# Patient Record
Sex: Female | Born: 1960 | Race: White | Hispanic: No | Marital: Married | Smoking: Former smoker
Health system: Southern US, Community
[De-identification: ages and names within clinical notes are randomized; demographics above are authoritative.]

## PROBLEM LIST (undated history)

## (undated) DIAGNOSIS — F329 Major depressive disorder, single episode, unspecified: Secondary | ICD-10-CM

## (undated) DIAGNOSIS — G35 Multiple sclerosis: Secondary | ICD-10-CM

## (undated) DIAGNOSIS — T7840XA Allergy, unspecified, initial encounter: Secondary | ICD-10-CM

## (undated) DIAGNOSIS — G4730 Sleep apnea, unspecified: Secondary | ICD-10-CM

## (undated) DIAGNOSIS — I10 Essential (primary) hypertension: Secondary | ICD-10-CM

## (undated) DIAGNOSIS — M1990 Unspecified osteoarthritis, unspecified site: Secondary | ICD-10-CM

## (undated) DIAGNOSIS — J45909 Unspecified asthma, uncomplicated: Secondary | ICD-10-CM

## (undated) DIAGNOSIS — F32A Depression, unspecified: Secondary | ICD-10-CM

## (undated) DIAGNOSIS — E119 Type 2 diabetes mellitus without complications: Secondary | ICD-10-CM

## (undated) DIAGNOSIS — N189 Chronic kidney disease, unspecified: Secondary | ICD-10-CM

## (undated) DIAGNOSIS — G709 Myoneural disorder, unspecified: Secondary | ICD-10-CM

## (undated) DIAGNOSIS — G35D Multiple sclerosis, unspecified: Secondary | ICD-10-CM

## (undated) HISTORY — DX: Depression, unspecified: F32.A

## (undated) HISTORY — DX: Multiple sclerosis: G35

## (undated) HISTORY — DX: Chronic kidney disease, unspecified: N18.9

## (undated) HISTORY — DX: Sleep apnea, unspecified: G47.30

## (undated) HISTORY — DX: Major depressive disorder, single episode, unspecified: F32.9

## (undated) HISTORY — DX: Unspecified osteoarthritis, unspecified site: M19.90

## (undated) HISTORY — DX: Type 2 diabetes mellitus without complications: E11.9

## (undated) HISTORY — DX: Allergy, unspecified, initial encounter: T78.40XA

## (undated) HISTORY — DX: Essential (primary) hypertension: I10

## (undated) HISTORY — DX: Multiple sclerosis, unspecified: G35.D

## (undated) HISTORY — DX: Myoneural disorder, unspecified: G70.9

## (undated) HISTORY — DX: Unspecified asthma, uncomplicated: J45.909

---

## 2011-08-11 DIAGNOSIS — K219 Gastro-esophageal reflux disease without esophagitis: Secondary | ICD-10-CM | POA: Insufficient documentation

## 2011-08-11 DIAGNOSIS — Z8742 Personal history of other diseases of the female genital tract: Secondary | ICD-10-CM | POA: Insufficient documentation

## 2011-08-11 DIAGNOSIS — E559 Vitamin D deficiency, unspecified: Secondary | ICD-10-CM | POA: Insufficient documentation

## 2011-08-11 DIAGNOSIS — Z860100 Personal history of colon polyps, unspecified: Secondary | ICD-10-CM | POA: Insufficient documentation

## 2011-08-11 DIAGNOSIS — Z79899 Other long term (current) drug therapy: Secondary | ICD-10-CM | POA: Insufficient documentation

## 2011-08-11 DIAGNOSIS — G35 Multiple sclerosis: Secondary | ICD-10-CM | POA: Insufficient documentation

## 2011-08-11 DIAGNOSIS — I10 Essential (primary) hypertension: Secondary | ICD-10-CM | POA: Insufficient documentation

## 2011-08-11 DIAGNOSIS — Z87898 Personal history of other specified conditions: Secondary | ICD-10-CM | POA: Insufficient documentation

## 2011-08-11 DIAGNOSIS — Z8669 Personal history of other diseases of the nervous system and sense organs: Secondary | ICD-10-CM | POA: Insufficient documentation

## 2011-08-11 DIAGNOSIS — R0989 Other specified symptoms and signs involving the circulatory and respiratory systems: Secondary | ICD-10-CM | POA: Insufficient documentation

## 2011-08-11 DIAGNOSIS — G4733 Obstructive sleep apnea (adult) (pediatric): Secondary | ICD-10-CM | POA: Insufficient documentation

## 2011-08-11 DIAGNOSIS — R002 Palpitations: Secondary | ICD-10-CM | POA: Insufficient documentation

## 2011-08-11 DIAGNOSIS — E119 Type 2 diabetes mellitus without complications: Secondary | ICD-10-CM | POA: Insufficient documentation

## 2011-08-11 DIAGNOSIS — M159 Polyosteoarthritis, unspecified: Secondary | ICD-10-CM | POA: Insufficient documentation

## 2011-08-11 DIAGNOSIS — N959 Unspecified menopausal and perimenopausal disorder: Secondary | ICD-10-CM | POA: Insufficient documentation

## 2011-08-11 DIAGNOSIS — F341 Dysthymic disorder: Secondary | ICD-10-CM | POA: Insufficient documentation

## 2011-08-11 DIAGNOSIS — E538 Deficiency of other specified B group vitamins: Secondary | ICD-10-CM | POA: Insufficient documentation

## 2011-08-11 DIAGNOSIS — E7800 Pure hypercholesterolemia, unspecified: Secondary | ICD-10-CM | POA: Insufficient documentation

## 2012-02-20 DIAGNOSIS — N183 Chronic kidney disease, stage 3 unspecified: Secondary | ICD-10-CM | POA: Insufficient documentation

## 2012-05-23 DIAGNOSIS — R161 Splenomegaly, not elsewhere classified: Secondary | ICD-10-CM | POA: Insufficient documentation

## 2012-05-24 DIAGNOSIS — Z8639 Personal history of other endocrine, nutritional and metabolic disease: Secondary | ICD-10-CM | POA: Insufficient documentation

## 2012-05-24 DIAGNOSIS — R002 Palpitations: Secondary | ICD-10-CM | POA: Insufficient documentation

## 2012-05-24 DIAGNOSIS — M169 Osteoarthritis of hip, unspecified: Secondary | ICD-10-CM | POA: Insufficient documentation

## 2012-05-24 DIAGNOSIS — Z780 Asymptomatic menopausal state: Secondary | ICD-10-CM | POA: Insufficient documentation

## 2012-05-24 DIAGNOSIS — K219 Gastro-esophageal reflux disease without esophagitis: Secondary | ICD-10-CM | POA: Insufficient documentation

## 2012-05-24 DIAGNOSIS — N951 Menopausal and female climacteric states: Secondary | ICD-10-CM | POA: Insufficient documentation

## 2012-05-24 DIAGNOSIS — M1990 Unspecified osteoarthritis, unspecified site: Secondary | ICD-10-CM | POA: Insufficient documentation

## 2012-05-24 DIAGNOSIS — K635 Polyp of colon: Secondary | ICD-10-CM | POA: Insufficient documentation

## 2012-05-24 DIAGNOSIS — E7800 Pure hypercholesterolemia, unspecified: Secondary | ICD-10-CM | POA: Insufficient documentation

## 2012-05-25 DIAGNOSIS — E1122 Type 2 diabetes mellitus with diabetic chronic kidney disease: Secondary | ICD-10-CM | POA: Insufficient documentation

## 2012-05-25 DIAGNOSIS — E119 Type 2 diabetes mellitus without complications: Secondary | ICD-10-CM | POA: Insufficient documentation

## 2012-07-29 ENCOUNTER — Ambulatory Visit: Payer: Self-pay | Admitting: Family Medicine

## 2013-03-24 ENCOUNTER — Ambulatory Visit: Payer: Self-pay | Admitting: Pain Medicine

## 2013-03-24 LAB — HEPATIC FUNCTION PANEL A (ARMC)
Albumin: 4.2 g/dL (ref 3.4–5.0)
Alkaline Phosphatase: 94 U/L
Bilirubin, Direct: <0.1 mg/dL (ref 0.00–0.20)
Bilirubin,Total: 0.3 mg/dL (ref 0.2–1.0)
SGOT(AST): 27 U/L (ref 15–37)
SGPT (ALT): 42 U/L (ref 12–78)
Total Protein: 7.8 g/dL (ref 6.4–8.2)

## 2013-03-24 LAB — BASIC METABOLIC PANEL
Anion Gap: 2 — ABNORMAL LOW (ref 7–16)
BUN: 24 mg/dL — ABNORMAL HIGH (ref 7–18)
Calcium, Total: 9.4 mg/dL (ref 8.5–10.1)
Chloride: 101 mmol/L (ref 98–107)
Co2: 31 mmol/L (ref 21–32)
Creatinine: 1.45 mg/dL — ABNORMAL HIGH (ref 0.60–1.30)
EGFR (African American): 48 — ABNORMAL LOW
EGFR (Non-African Amer.): 41 — ABNORMAL LOW
Glucose: 169 mg/dL — ABNORMAL HIGH (ref 65–99)
Osmolality: 276 (ref 275–301)
Potassium: 4.6 mmol/L (ref 3.5–5.1)
Sodium: 134 mmol/L — ABNORMAL LOW (ref 136–145)

## 2013-03-24 LAB — SEDIMENTATION RATE: Erythrocyte Sed Rate: 27 mm/hr (ref 0–30)

## 2013-03-24 LAB — MAGNESIUM: Magnesium: 1.7 mg/dL — ABNORMAL LOW

## 2013-04-28 ENCOUNTER — Other Ambulatory Visit: Payer: Self-pay | Admitting: Pain Medicine

## 2013-04-28 ENCOUNTER — Ambulatory Visit: Payer: Self-pay | Admitting: Pain Medicine

## 2013-05-26 ENCOUNTER — Ambulatory Visit: Payer: Self-pay | Admitting: Pain Medicine

## 2013-06-23 ENCOUNTER — Ambulatory Visit: Payer: Self-pay | Admitting: Pain Medicine

## 2013-08-19 ENCOUNTER — Ambulatory Visit: Payer: Self-pay | Admitting: Internal Medicine

## 2013-08-27 ENCOUNTER — Ambulatory Visit: Payer: Self-pay | Admitting: Pain Medicine

## 2013-11-27 ENCOUNTER — Ambulatory Visit: Payer: Self-pay | Admitting: Pain Medicine

## 2014-11-03 IMAGING — CR DG LUMBAR SPINE COMPLETE W/ BEND
1 series · 6 of 6 positions shown · non-contrast
Comparison: None.

CLINICAL DATA: Chronic pain

EXAM:
LUMBAR SPINE - COMPLETE WITH BENDING VIEWS

[Series 1: w lumbar spine lat · 0.14mm/px · 6 of 6 slices shown]
[im 1/6]
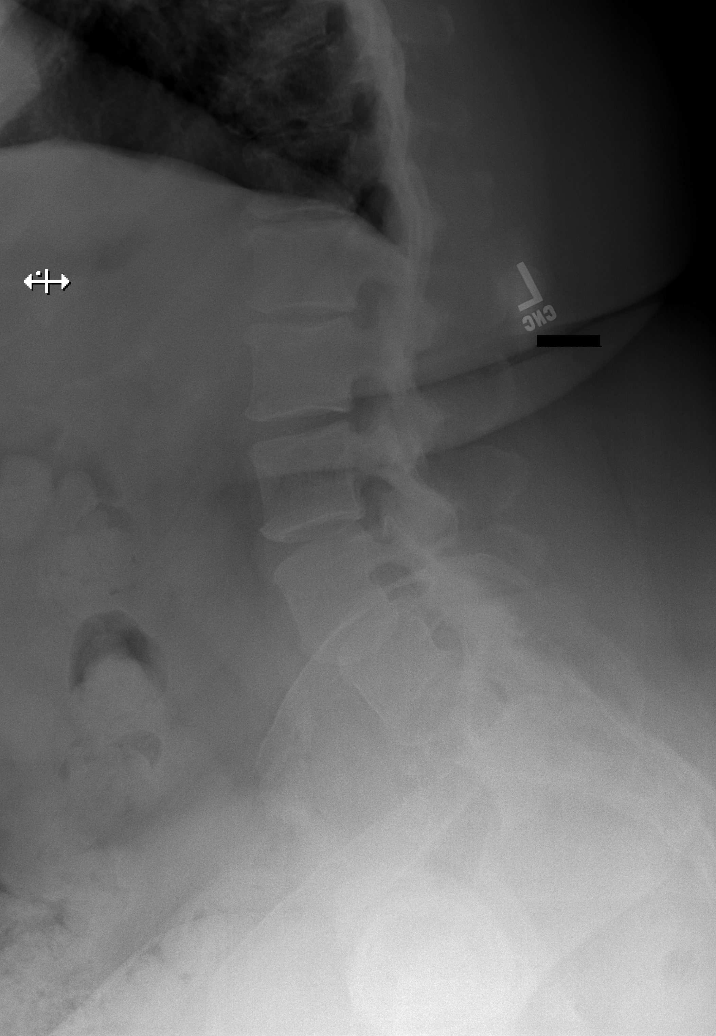
[im 2/6]
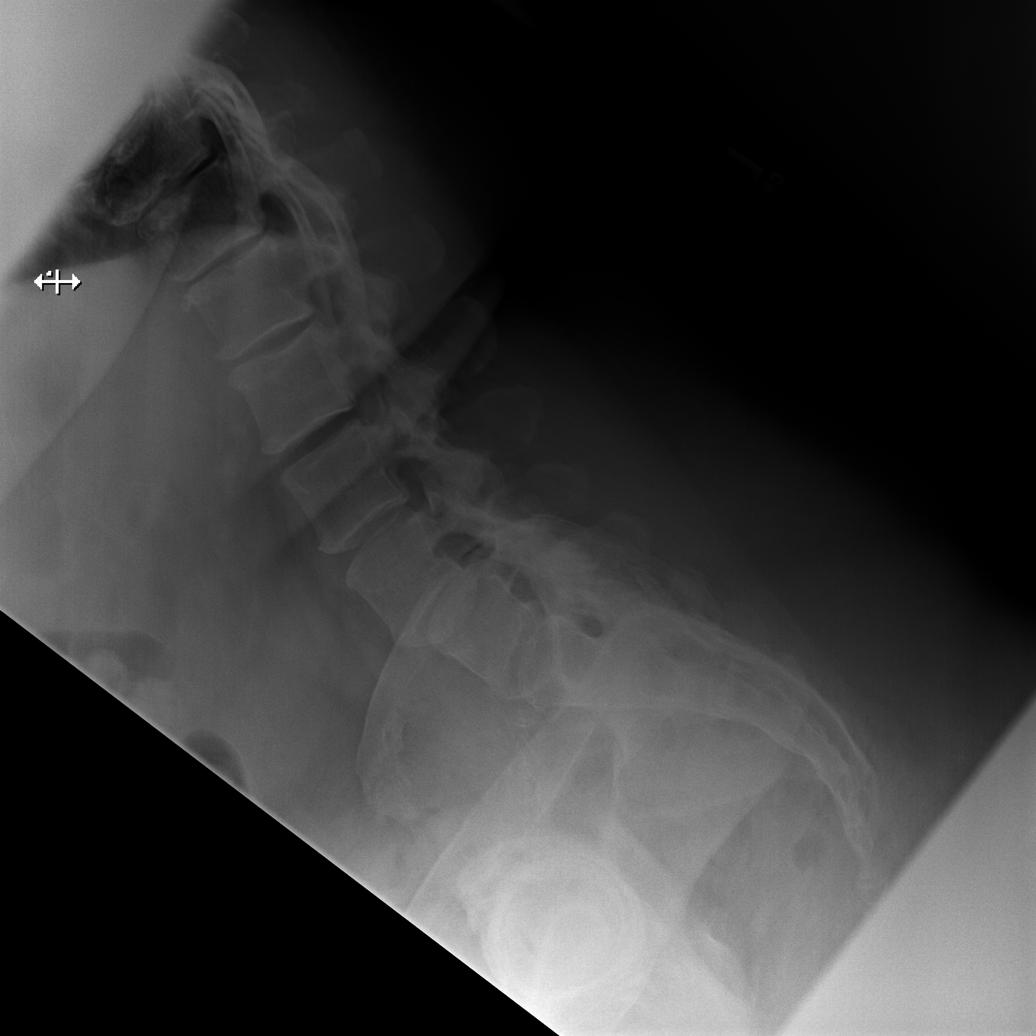
[im 3/6]
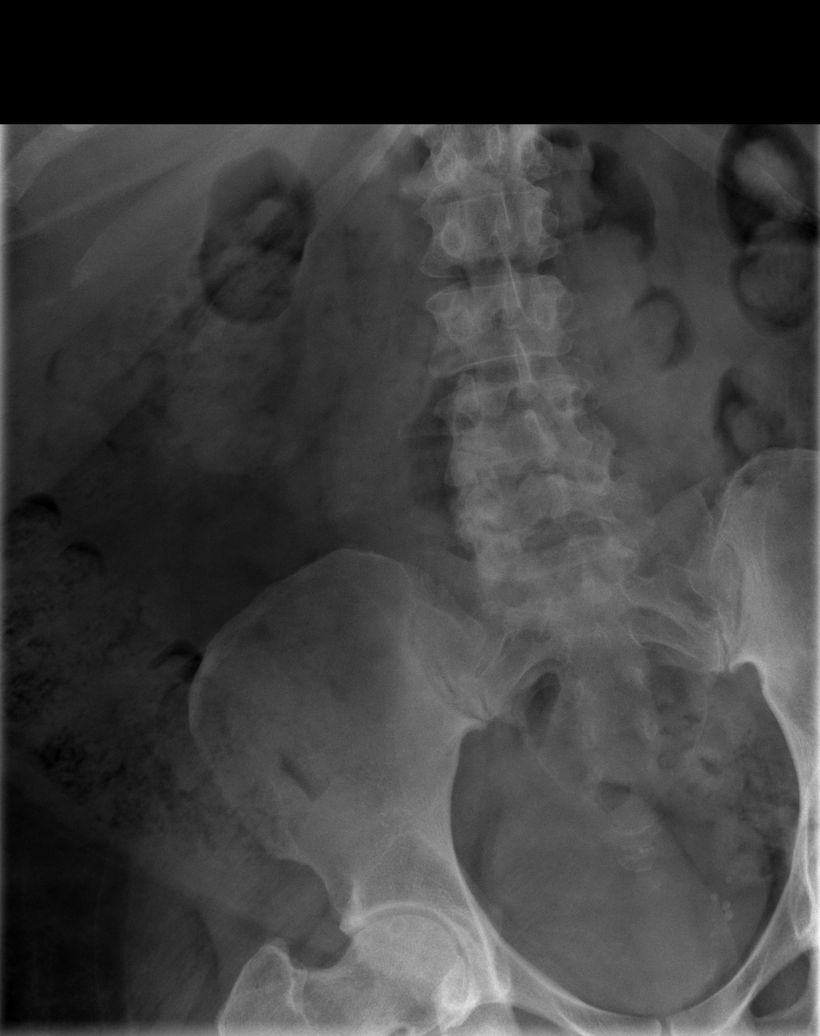
[im 4/6]
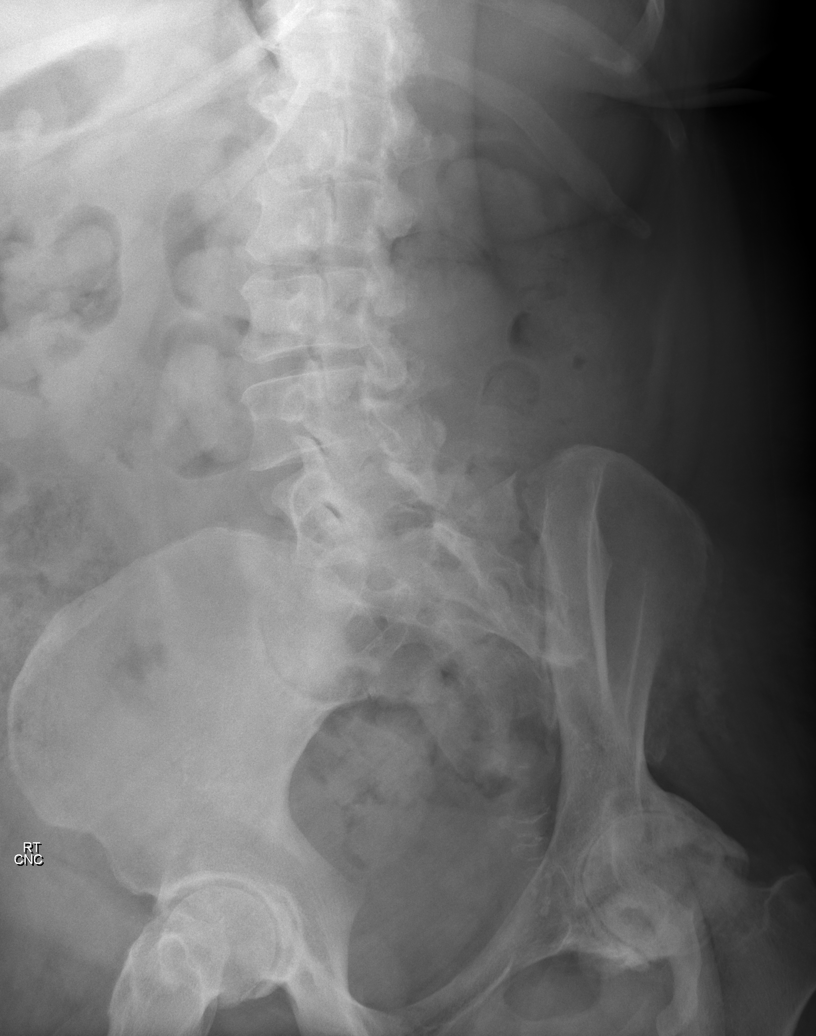
[im 5/6]
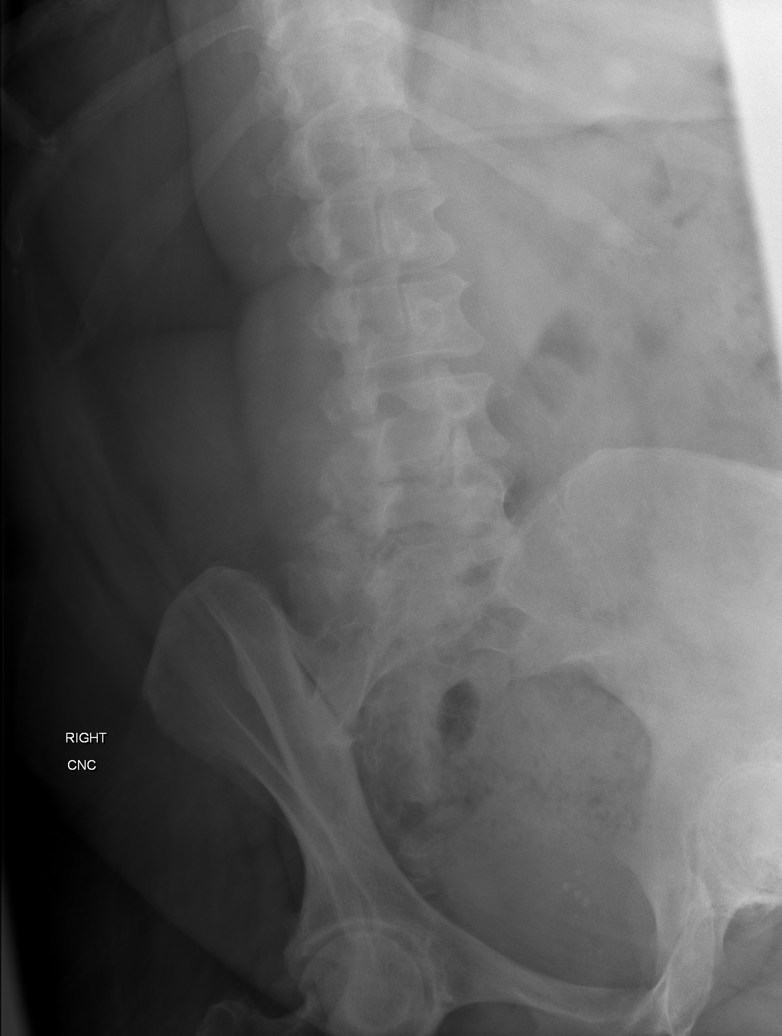
[im 6/6]
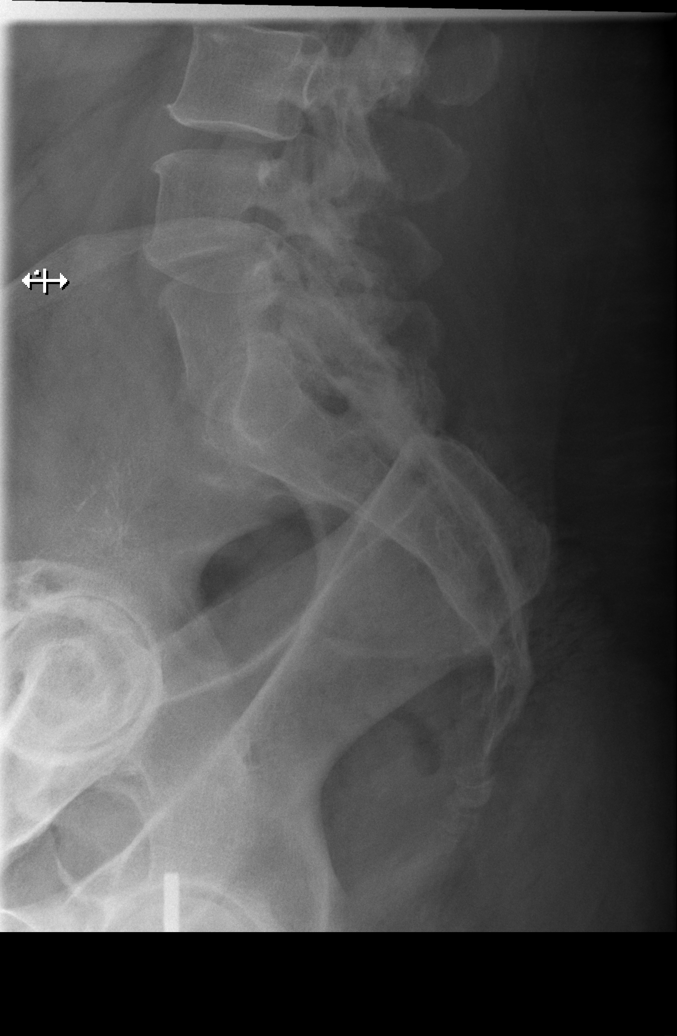

[6 of 6 positions shown; findings below may reference images not displayed]

FINDINGS: Negative fracture. Grade 1 anterolisthesis L4-5 without evidence of
dynamic instability on flexion-extension. No definite pars defect.
Bilateral facet degenerative hypertrophy L4-5 and L5-S1, right worse
than left. Advanced bilateral hip osteoarthritis. Disc narrowing and
small endplate spurs in the visualized lower thoracic spine.
IMPRESSION: 1. Negative for fracture or other acute bone abnormality.
2. Spondylitic changes in the lower thoracic and lower lumbar spine
as above.
3. Grade 1 anterolisthesis L4-5 without dynamic instability,
possibly secondary to facet disease.

## 2014-11-03 IMAGING — CR BILATERAL SACROILIAC JOINTS - 3+ VIEW
1 series · 4 of 4 positions shown · non-contrast
Comparison: DG HIP COMPLETE 2+V*L* dated 03/24/2013; DG HIP COMPLETE
2+V*R* dated 03/24/2013

CLINICAL DATA: Chronic pain syndrome with limited range of motion.

EXAM:
BILATERAL SACROILIAC JOINTS - 3+ VIEW

[Series 1: t sacrum ap · 0.14mm/px · 4 of 4 slices shown]
[im 1/4]
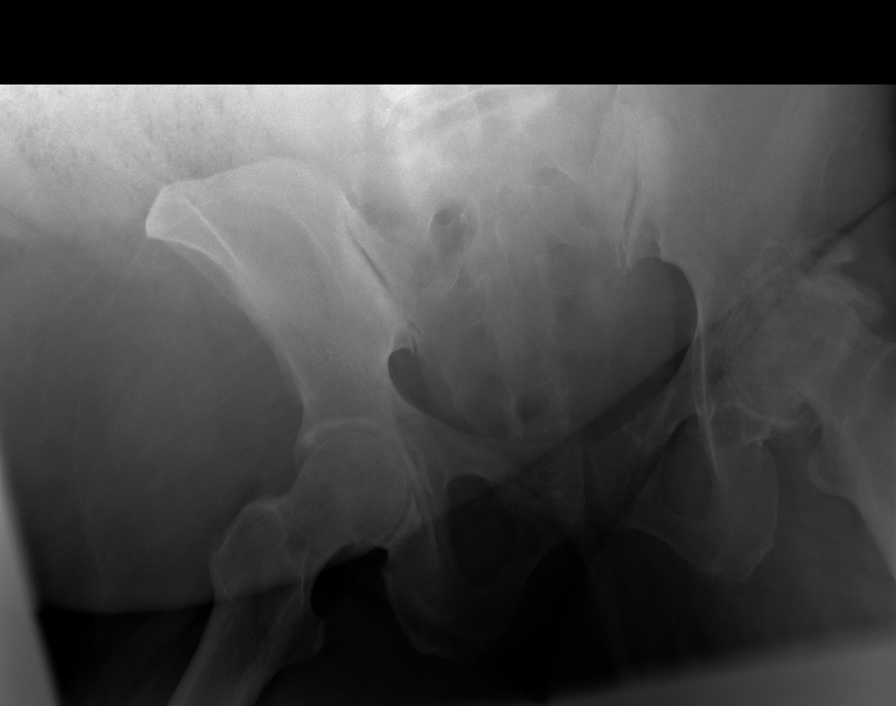
[im 2/4]
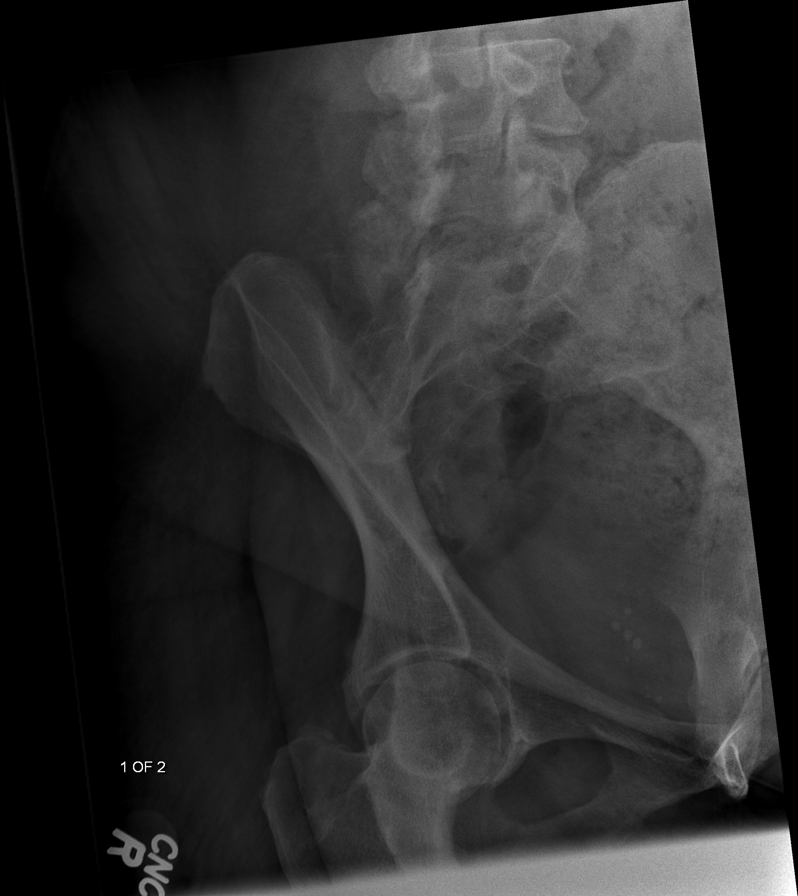
[im 3/4]
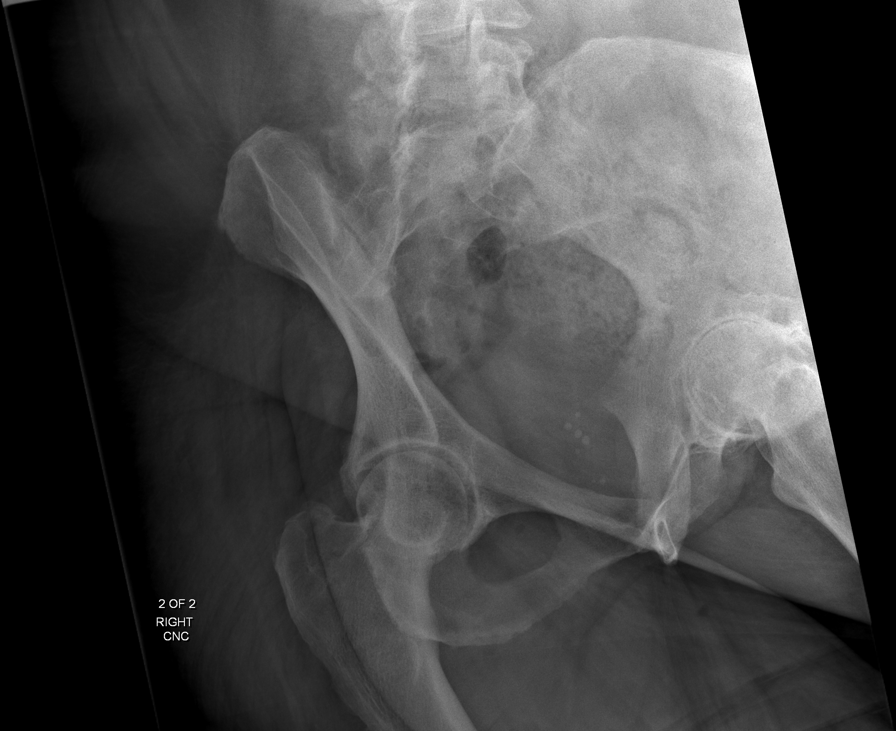
[im 4/4]
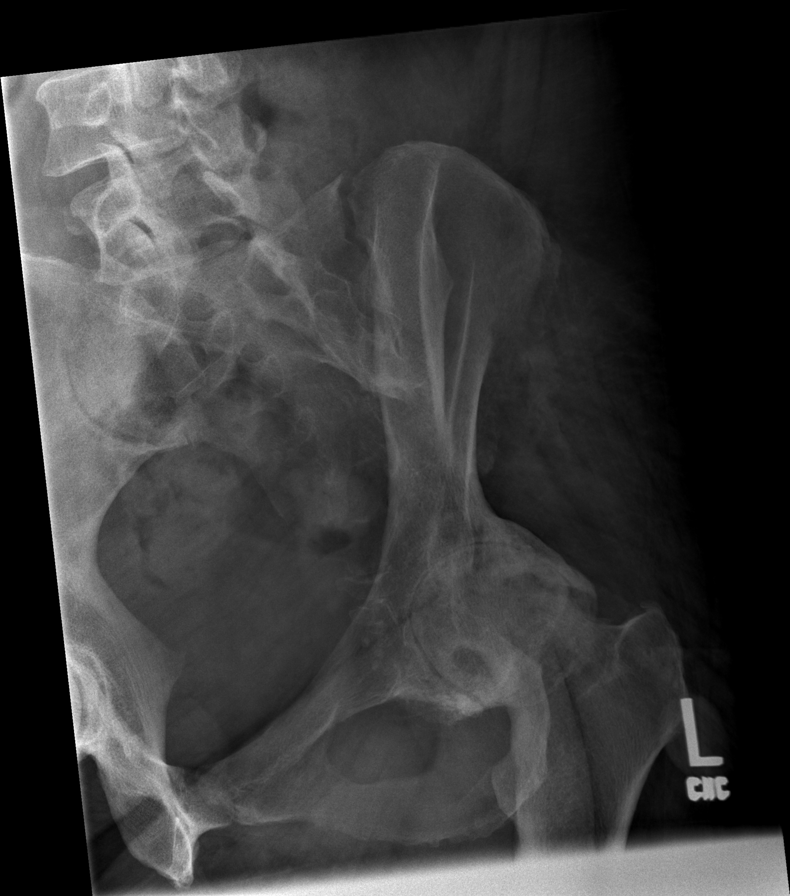

[4 of 4 positions shown; findings below may reference images not displayed]

FINDINGS: There are mild symmetric degenerative changes of the sacroiliac
joints. No erosive changes are identified. Advanced left hip
osteoarthritis is noted.
IMPRESSION: Mild symmetric degenerative changes of both sacroiliac joints.

## 2014-11-16 ENCOUNTER — Ambulatory Visit: Payer: BLUE CROSS/BLUE SHIELD | Attending: Pain Medicine | Admitting: Pain Medicine

## 2014-11-16 ENCOUNTER — Other Ambulatory Visit: Payer: Self-pay | Admitting: Pain Medicine

## 2014-11-16 ENCOUNTER — Encounter: Payer: Self-pay | Admitting: Pain Medicine

## 2014-11-16 VITALS — BP 100/49 | HR 79 | Temp 98.1°F | Resp 18 | Ht 62.0 in | Wt 304.0 lb

## 2014-11-16 DIAGNOSIS — M539 Dorsopathy, unspecified: Secondary | ICD-10-CM

## 2014-11-16 DIAGNOSIS — K219 Gastro-esophageal reflux disease without esophagitis: Secondary | ICD-10-CM | POA: Diagnosis not present

## 2014-11-16 DIAGNOSIS — M545 Low back pain, unspecified: Secondary | ICD-10-CM

## 2014-11-16 DIAGNOSIS — Z79899 Other long term (current) drug therapy: Secondary | ICD-10-CM | POA: Diagnosis not present

## 2014-11-16 DIAGNOSIS — G35 Multiple sclerosis: Secondary | ICD-10-CM | POA: Diagnosis not present

## 2014-11-16 DIAGNOSIS — J309 Allergic rhinitis, unspecified: Secondary | ICD-10-CM | POA: Insufficient documentation

## 2014-11-16 DIAGNOSIS — M4726 Other spondylosis with radiculopathy, lumbar region: Secondary | ICD-10-CM

## 2014-11-16 DIAGNOSIS — M79605 Pain in left leg: Secondary | ICD-10-CM

## 2014-11-16 DIAGNOSIS — M5441 Lumbago with sciatica, right side: Secondary | ICD-10-CM

## 2014-11-16 DIAGNOSIS — M533 Sacrococcygeal disorders, not elsewhere classified: Secondary | ICD-10-CM

## 2014-11-16 DIAGNOSIS — G8929 Other chronic pain: Secondary | ICD-10-CM

## 2014-11-16 DIAGNOSIS — G96198 Other disorders of meninges, not elsewhere classified: Secondary | ICD-10-CM

## 2014-11-16 DIAGNOSIS — M797 Fibromyalgia: Secondary | ICD-10-CM

## 2014-11-16 DIAGNOSIS — M129 Arthropathy, unspecified: Secondary | ICD-10-CM

## 2014-11-16 DIAGNOSIS — M25552 Pain in left hip: Secondary | ICD-10-CM | POA: Diagnosis not present

## 2014-11-16 DIAGNOSIS — M47816 Spondylosis without myelopathy or radiculopathy, lumbar region: Secondary | ICD-10-CM | POA: Diagnosis not present

## 2014-11-16 DIAGNOSIS — G9619 Other disorders of meninges, not elsewhere classified: Secondary | ICD-10-CM | POA: Diagnosis not present

## 2014-11-16 DIAGNOSIS — E7800 Pure hypercholesterolemia, unspecified: Secondary | ICD-10-CM | POA: Diagnosis not present

## 2014-11-16 DIAGNOSIS — Z5181 Encounter for therapeutic drug level monitoring: Secondary | ICD-10-CM

## 2014-11-16 DIAGNOSIS — I10 Essential (primary) hypertension: Secondary | ICD-10-CM | POA: Insufficient documentation

## 2014-11-16 DIAGNOSIS — R52 Pain, unspecified: Secondary | ICD-10-CM | POA: Diagnosis present

## 2014-11-16 DIAGNOSIS — Q762 Congenital spondylolisthesis: Secondary | ICD-10-CM

## 2014-11-16 DIAGNOSIS — E538 Deficiency of other specified B group vitamins: Secondary | ICD-10-CM | POA: Insufficient documentation

## 2014-11-16 DIAGNOSIS — M1612 Unilateral primary osteoarthritis, left hip: Secondary | ICD-10-CM

## 2014-11-16 DIAGNOSIS — E119 Type 2 diabetes mellitus without complications: Secondary | ICD-10-CM | POA: Insufficient documentation

## 2014-11-16 DIAGNOSIS — Z87891 Personal history of nicotine dependence: Secondary | ICD-10-CM | POA: Insufficient documentation

## 2014-11-16 DIAGNOSIS — F1120 Opioid dependence, uncomplicated: Secondary | ICD-10-CM

## 2014-11-16 DIAGNOSIS — M47896 Other spondylosis, lumbar region: Secondary | ICD-10-CM

## 2014-11-16 DIAGNOSIS — I129 Hypertensive chronic kidney disease with stage 1 through stage 4 chronic kidney disease, or unspecified chronic kidney disease: Secondary | ICD-10-CM | POA: Diagnosis not present

## 2014-11-16 DIAGNOSIS — M5416 Radiculopathy, lumbar region: Secondary | ICD-10-CM

## 2014-11-16 DIAGNOSIS — M961 Postlaminectomy syndrome, not elsewhere classified: Secondary | ICD-10-CM

## 2014-11-16 DIAGNOSIS — R161 Splenomegaly, not elsewhere classified: Secondary | ICD-10-CM | POA: Insufficient documentation

## 2014-11-16 DIAGNOSIS — Z79891 Long term (current) use of opiate analgesic: Secondary | ICD-10-CM

## 2014-11-16 DIAGNOSIS — M4310 Spondylolisthesis, site unspecified: Secondary | ICD-10-CM

## 2014-11-16 DIAGNOSIS — F1190 Opioid use, unspecified, uncomplicated: Secondary | ICD-10-CM

## 2014-11-16 MED ORDER — MORPHINE SULFATE ER 30 MG PO TBCR: 30.0000 mg | EXTENDED_RELEASE_TABLET | Freq: Three times a day (TID) | ORAL | Status: DC | PRN

## 2014-11-16 MED ORDER — PREGABALIN 100 MG PO CAPS: 100.0000 mg | ORAL_CAPSULE | Freq: Three times a day (TID) | ORAL | Status: DC

## 2014-11-16 MED ORDER — HYDROCODONE-ACETAMINOPHEN 5-325 MG PO TABS: 1.0000 | ORAL_TABLET | Freq: Four times a day (QID) | ORAL | Status: DC | PRN

## 2014-11-16 NOTE — Patient Instructions (Signed)
A prescription for MS CONTIN X3, HYDROCODONE X3, LYRICA X3 was given to you today.

## 2014-11-16 NOTE — Progress Notes (Signed)
Pill count: Hydrocodone:  #36 Lyrica:  #23 MS contin:  #23

## 2014-11-16 NOTE — Progress Notes (Signed)
Patient's Name: Tiffany Herring MRN: [id number] DOB: May 30, 1960 DOS: 11/16/2014  Primary Reason(s) for Visit: Encounter for Medication Management. CC: Pain   HPI:   Tiffany Herring is a 54 y.o. year old, female patient, who returns today as an established patient. She has Chronic pain; Failed back surgical syndrome; Epidural fibrosis; Chronic low back pain; Chronic lumbar radicular pain (left-sided); Chronic left hip pain; Long term current use of opiate analgesic; Long term prescription opiate use; Opiate use; Opiate dependence (HCC); Encounter for therapeutic drug level monitoring; Allergic rhinitis; Carotid artery bruit; Chronic kidney disease (CKD), stage III (moderate); Colon polyp; Diabetes mellitus (HCC); Dysthymia; Essential (primary) hypertension; Generalized OA; Acid reflux; History of intermenstrual bleeding; History of migraine headaches; H/O nutritional disorder; Hypercholesterolemia; Type 2 diabetes mellitus treated with insulin (HCC); Other long term (current) drug therapy; Menopausal and perimenopausal disorder; Class III Morbid obesity (HCC) (254% higher incidence of chronic low back pain); Multiple sclerosis (HCC); Obstructive apnea; Degenerative arthritis of hip; Awareness of heartbeats; History of colon polyps; Post menopausal syndrome; Pure hypercholesterolemia; Enlargement of spleen; B12 deficiency; Fibromyalgia; Lumbar spondylosis; Grade 1 Anterolisthesis of L4 over L5; Chronic sacroiliac joint pain (bilateral); Chronic pain of left lower extremity; Chronic left hip pain; Arthropathy of left hip (Severe chronic left hip DJD); Osteoarthritis of left hip; Lumbar facet arthropathy; Lumbar facet hypertrophy; and Lumbar facet syndrome on her problem list.. Her primarily concern today is the Pain    Morbidly obese in wheelchair. Today's Pain Score: 5  Reported level of pain is compatible with clinical observation Pain Type: Chronic pain Pain Location: Hip Pain Orientation: Left Pain  Descriptors / Indicators: Sharp, Shooting Pain Frequency: Intermittent  Pharmacotherapy Review:   Side-effects or Adverse reactions: None reported. Effectiveness: Described as relatively effective, allowing for increase in activities of daily living (ADL). Onset of action: Within expected pharmacological parameters. Duration of action: Within normal limits for medication. Peak effect: Timing and results are as within normal expected parameters. [place] PMP: Compliant with practice rules and regulations. DST: Compliant with practice rules and regulations. Lab work: No new labs ordered by our practice. Treatment compliance: Compliant. Substance Use Disorder (SUD) Risk Level: Low Planned course of action: Continue therapy as is.  Allergies: Tiffany Herring is allergic to augmentin; mobic; and sulfa antibiotics.  Meds: The patient has a current medication list which includes the following prescription(s): albuterol, aspirin ec, bupropion, vitamin d3, enalapril, furosemide, glucose blood, hydrocodone-acetaminophen, insulin nph-regular human, insulin regular, ipratropium, accu-chek multiclix, mirtazapine, montelukast, morphine, oxybutynin, pregabalin, tizanidine, triamcinolone cream, vitamin b-12, hydrocodone-acetaminophen, hydrocodone-acetaminophen, morphine, and morphine. Requested Prescriptions   Signed Prescriptions Disp Refills  . HYDROcodone-acetaminophen (NORCO/VICODIN) 5-325 MG tablet 120 tablet 0    Sig: Take 1 tablet by mouth every 6 (six) hours as needed for moderate pain.  Marland Kitchen morphine (MS CONTIN) 30 MG 12 hr tablet 90 tablet 0    Sig: Take 1 tablet (30 mg total) by mouth every 8 (eight) hours as needed for pain.  Marland Kitchen morphine (MS CONTIN) 30 MG 12 hr tablet 90 tablet 0    Sig: Take 1 tablet (30 mg total) by mouth every 8 (eight) hours as needed for pain.  Marland Kitchen morphine (MS CONTIN) 30 MG 12 hr tablet 90 tablet 0    Sig: Take 1 tablet (30 mg total) by mouth every 8 (eight) hours as needed for  pain.  Marland Kitchen HYDROcodone-acetaminophen (NORCO/VICODIN) 5-325 MG tablet 120 tablet 0    Sig: Take 1 tablet by mouth every 6 (six) hours as needed for moderate  pain.  Marland Kitchen HYDROcodone-acetaminophen (NORCO/VICODIN) 5-325 MG tablet 120 tablet 0    Sig: Take 1 tablet by mouth every 6 (six) hours as needed for moderate pain.  . pregabalin (LYRICA) 100 MG capsule 90 capsule 2    Sig: Take 1 capsule (100 mg total) by mouth 3 (three) times daily.    ROS: Constitutional: Afebrile, no chills, well hydrated and well nourished Gastrointestinal: negative Musculoskeletal:negative Neurological: negative Behavioral/Psych: negative  PFSH: Medical:  Tiffany Herring  has a past medical history of Allergy; Arthritis; Asthma; Depression; Diabetes mellitus without complication (HCC); Sleep apnea; Hypertension; Chronic kidney disease; Neuromuscular disorder (HCC); and Multiple sclerosis (HCC). Family: family history includes Dementia in her mother; Depression in her mother; Diabetes in her mother; Hyperlipidemia in her mother; Stroke in her father. Surgical:  has past surgical history that includes Cesarean section (1991 and 1996). Tobacco:  reports that she has quit smoking. She does not have any smokeless tobacco history on file. Alcohol:  reports that she does not drink alcohol. Drug:  reports that she does not use illicit drugs.  Physical Exam: Vitals:  Today's Vitals   11/16/14 1005  BP: 100/49  Pulse: 79  Temp: 98.1 F (36.7 C)  TempSrc: Oral  Resp: 18  Height:  (1.575 m)  Weight: 304 lb (137.893 kg)  SpO2: 98%  PainSc: 5   PainLoc: Hip  Calculated BMI: Body mass index is 55.59 kg/(m^2). General appearance: alert, cooperative, appears older than stated age, no distress and morbidly obese Eyes: conjunctivae/corneas clear. PERRL, EOM's intact. Fundi benign. Lungs: No evidence respiratory distress, no audible rales or ronchi and no use of accessory muscles of respiration Neck: no adenopathy, no  carotid bruit, no JVD, supple, symmetrical, trachea midline and thyroid not enlarged, symmetric, no tenderness/mass/nodules Back: symmetric, no curvature. ROM normal. No CVA tenderness. Extremities: edema of both lower extremities. Pulses: 2+ and symmetric Skin: Skin color, texture, turgor normal. No rashes or lesions Neurologic: Motor: Generalized weakness compatible with her multiple sclerosis.    Assessment: Encounter Diagnosis:  Primary Diagnosis: Chronic pain [G89.29]  Plan: Tiffany Herring was seen today for pain.  Diagnoses and all orders for this visit:  Chronic pain -     COMPLETE METABOLIC PANEL WITH GFR; Future -     C-reactive protein; Future -     Magnesium; Future -     Sedimentation rate; Future -     Vitamin D2,D3 Panel; Future -     HYDROcodone-acetaminophen (NORCO/VICODIN) 5-325 MG tablet; Take 1 tablet by mouth every 6 (six) hours as needed for moderate pain. -     morphine (MS CONTIN) 30 MG 12 hr tablet; Take 1 tablet (30 mg total) by mouth every 8 (eight) hours as needed for pain. -     morphine (MS CONTIN) 30 MG 12 hr tablet; Take 1 tablet (30 mg total) by mouth every 8 (eight) hours as needed for pain. -     morphine (MS CONTIN) 30 MG 12 hr tablet; Take 1 tablet (30 mg total) by mouth every 8 (eight) hours as needed for pain. -     HYDROcodone-acetaminophen (NORCO/VICODIN) 5-325 MG tablet; Take 1 tablet by mouth every 6 (six) hours as needed for moderate pain. -     HYDROcodone-acetaminophen (NORCO/VICODIN) 5-325 MG tablet; Take 1 tablet by mouth every 6 (six) hours as needed for moderate pain.  Failed back surgical syndrome -     Cancel: DG Lumbar Spine Complete W/Bend; Future  Epidural fibrosis  Chronic low back pain  Chronic lumbar radicular pain  Chronic hip pain, left  Long term current use of opiate analgesic -     Drugs of abuse screen w/o alc, rtn urine-sln; Future  Long term prescription opiate use  Opiate use  Uncomplicated opioid dependence  (HCC)  Encounter for therapeutic drug level monitoring  Fibromyalgia -     pregabalin (LYRICA) 100 MG capsule; Take 1 capsule (100 mg total) by mouth 3 (three) times daily.  Osteoarthritis of spine with radiculopathy, lumbar region  Grade 1 Anterolisthesis of L4 over L5  Chronic sacroiliac joint pain (bilateral)  Chronic pain of left lower extremity  Chronic left hip pain  Arthropathy of left hip  Primary osteoarthritis of left hip  Lumbar facet arthropathy  Lumbar facet hypertrophy  Lumbar facet syndrome     Patient Instructions  A prescription for MS CONTIN X3, HYDROCODONE X3, LYRICA X3 was given to you today.   Medications discontinued today:  Medications Discontinued During This Encounter  Medication Reason  . Hydrocodone-Acetaminophen (VICODIN) 5-300 MG TABS Entry Error  . HYDROcodone-acetaminophen (NORCO/VICODIN) 5-325 MG tablet Reorder  . morphine (MS CONTIN) 30 MG 12 hr tablet Reorder  . pregabalin (LYRICA) 100 MG capsule Reorder   Medications administered today:  Tiffany Herring had no medications administered during this visit.  Primary Care Physician: Rolm Gala, MD Location: [facility] Note by: Sydnee Levans. Laban Emperor, M.D, DABA, DABAPM, DABPM, DABIPP, FIPP

## 2014-11-17 DIAGNOSIS — G8929 Other chronic pain: Secondary | ICD-10-CM | POA: Insufficient documentation

## 2014-11-17 DIAGNOSIS — M47816 Spondylosis without myelopathy or radiculopathy, lumbar region: Secondary | ICD-10-CM | POA: Insufficient documentation

## 2014-11-17 DIAGNOSIS — M533 Sacrococcygeal disorders, not elsewhere classified: Secondary | ICD-10-CM

## 2014-11-17 DIAGNOSIS — M25552 Pain in left hip: Secondary | ICD-10-CM

## 2014-11-17 DIAGNOSIS — M4310 Spondylolisthesis, site unspecified: Secondary | ICD-10-CM | POA: Insufficient documentation

## 2014-11-17 DIAGNOSIS — M1612 Unilateral primary osteoarthritis, left hip: Secondary | ICD-10-CM | POA: Insufficient documentation

## 2014-11-24 LAB — TOXASSURE SELECT 13 (MW), URINE: PDF: 0

## 2014-12-15 ENCOUNTER — Other Ambulatory Visit: Payer: Self-pay | Admitting: Pain Medicine

## 2014-12-22 ENCOUNTER — Telehealth: Payer: Self-pay | Admitting: Pain Medicine

## 2014-12-22 NOTE — Telephone Encounter (Signed)
Patient cannot have labs drawn at [facility] due to insurance / She can have them at [facility] and her pcp will order them but she needs a billable diagnosis code for these labs  Please call patient with this and also not sure if ins will pay for Vitamin D test as the last 2 were normal ?

## 2014-12-23 ENCOUNTER — Other Ambulatory Visit: Payer: Self-pay | Admitting: *Deleted

## 2014-12-24 NOTE — Telephone Encounter (Signed)
I have spoken with a receptionist, a nurse, and a lab employee at [facility]. None of these know what to do about this. Patient notified.

## 2015-02-04 NOTE — Telephone Encounter (Signed)
Im not sure about lab work and insurance, I would recommend that the patient call her insurance company and ask about coverage for the test. She may need the CPT codes from her PCP office before she calls the insurance.

## 2015-02-15 ENCOUNTER — Encounter: Payer: Self-pay | Admitting: Pain Medicine

## 2015-02-15 ENCOUNTER — Ambulatory Visit: Payer: BLUE CROSS/BLUE SHIELD | Attending: Pain Medicine | Admitting: Pain Medicine

## 2015-02-15 ENCOUNTER — Other Ambulatory Visit: Payer: Self-pay | Admitting: Pain Medicine

## 2015-02-15 VITALS — BP 135/64 | HR 72 | Temp 98.6°F | Resp 18 | Ht 62.0 in | Wt 301.0 lb

## 2015-02-15 DIAGNOSIS — I129 Hypertensive chronic kidney disease with stage 1 through stage 4 chronic kidney disease, or unspecified chronic kidney disease: Secondary | ICD-10-CM | POA: Insufficient documentation

## 2015-02-15 DIAGNOSIS — M1612 Unilateral primary osteoarthritis, left hip: Secondary | ICD-10-CM | POA: Insufficient documentation

## 2015-02-15 DIAGNOSIS — K219 Gastro-esophageal reflux disease without esophagitis: Secondary | ICD-10-CM | POA: Diagnosis not present

## 2015-02-15 DIAGNOSIS — M4316 Spondylolisthesis, lumbar region: Secondary | ICD-10-CM | POA: Diagnosis not present

## 2015-02-15 DIAGNOSIS — E7800 Pure hypercholesterolemia, unspecified: Secondary | ICD-10-CM | POA: Insufficient documentation

## 2015-02-15 DIAGNOSIS — Z5181 Encounter for therapeutic drug level monitoring: Secondary | ICD-10-CM | POA: Diagnosis not present

## 2015-02-15 DIAGNOSIS — M25511 Pain in right shoulder: Secondary | ICD-10-CM

## 2015-02-15 DIAGNOSIS — R161 Splenomegaly, not elsewhere classified: Secondary | ICD-10-CM | POA: Diagnosis not present

## 2015-02-15 DIAGNOSIS — G43909 Migraine, unspecified, not intractable, without status migrainosus: Secondary | ICD-10-CM | POA: Insufficient documentation

## 2015-02-15 DIAGNOSIS — G8929 Other chronic pain: Secondary | ICD-10-CM | POA: Insufficient documentation

## 2015-02-15 DIAGNOSIS — M797 Fibromyalgia: Secondary | ICD-10-CM

## 2015-02-15 DIAGNOSIS — M542 Cervicalgia: Secondary | ICD-10-CM | POA: Diagnosis not present

## 2015-02-15 DIAGNOSIS — E538 Deficiency of other specified B group vitamins: Secondary | ICD-10-CM | POA: Diagnosis not present

## 2015-02-15 DIAGNOSIS — G35 Multiple sclerosis: Secondary | ICD-10-CM | POA: Insufficient documentation

## 2015-02-15 DIAGNOSIS — R1011 Right upper quadrant pain: Secondary | ICD-10-CM

## 2015-02-15 DIAGNOSIS — M545 Low back pain, unspecified: Secondary | ICD-10-CM

## 2015-02-15 DIAGNOSIS — Z87891 Personal history of nicotine dependence: Secondary | ICD-10-CM | POA: Diagnosis not present

## 2015-02-15 DIAGNOSIS — M25559 Pain in unspecified hip: Secondary | ICD-10-CM | POA: Diagnosis present

## 2015-02-15 DIAGNOSIS — M79603 Pain in arm, unspecified: Secondary | ICD-10-CM | POA: Diagnosis present

## 2015-02-15 DIAGNOSIS — M79604 Pain in right leg: Secondary | ICD-10-CM | POA: Diagnosis not present

## 2015-02-15 DIAGNOSIS — Z79891 Long term (current) use of opiate analgesic: Secondary | ICD-10-CM

## 2015-02-15 DIAGNOSIS — M791 Myalgia: Secondary | ICD-10-CM | POA: Insufficient documentation

## 2015-02-15 DIAGNOSIS — F1190 Opioid use, unspecified, uncomplicated: Secondary | ICD-10-CM | POA: Insufficient documentation

## 2015-02-15 DIAGNOSIS — R109 Unspecified abdominal pain: Secondary | ICD-10-CM | POA: Diagnosis present

## 2015-02-15 DIAGNOSIS — E119 Type 2 diabetes mellitus without complications: Secondary | ICD-10-CM | POA: Diagnosis not present

## 2015-02-15 DIAGNOSIS — G9619 Other disorders of meninges, not elsewhere classified: Secondary | ICD-10-CM | POA: Diagnosis not present

## 2015-02-15 LAB — COMPREHENSIVE METABOLIC PANEL
ALT: 19 U/L (ref 14–54)
AST: 16 U/L (ref 15–41)
Albumin: 4.3 g/dL (ref 3.5–5.0)
Alkaline Phosphatase: 77 U/L (ref 38–126)
Anion gap: 6 (ref 5–15)
BUN: 27 mg/dL — ABNORMAL HIGH (ref 6–20)
CO2: 30 mmol/L (ref 22–32)
Calcium: 9.4 mg/dL (ref 8.9–10.3)
Chloride: 102 mmol/L (ref 101–111)
Creatinine, Ser: 1.42 mg/dL — ABNORMAL HIGH (ref 0.44–1.00)
GFR calc Af Amer: 47 mL/min — ABNORMAL LOW (ref >60)
GFR calc non Af Amer: 41 mL/min — ABNORMAL LOW (ref >60)
Glucose, Bld: 100 mg/dL — ABNORMAL HIGH (ref 65–99)
Potassium: 4.8 mmol/L (ref 3.5–5.1)
Sodium: 138 mmol/L (ref 135–145)
Total Bilirubin: 0.6 mg/dL (ref 0.3–1.2)
Total Protein: 7.7 g/dL (ref 6.5–8.1)

## 2015-02-15 LAB — MAGNESIUM: Magnesium: 2 mg/dL (ref 1.7–2.4)

## 2015-02-15 LAB — SEDIMENTATION RATE: Sed Rate: 28 mm/hr (ref 0–30)

## 2015-02-15 MED ORDER — PREGABALIN 150 MG PO CAPS: 150.0000 mg | ORAL_CAPSULE | Freq: Three times a day (TID) | ORAL | Status: DC

## 2015-02-15 MED ORDER — MORPHINE SULFATE ER 30 MG PO TBCR: 30.0000 mg | EXTENDED_RELEASE_TABLET | Freq: Three times a day (TID) | ORAL | Status: DC | PRN

## 2015-02-15 MED ORDER — HYDROCODONE-ACETAMINOPHEN 5-325 MG PO TABS: 1.0000 | ORAL_TABLET | Freq: Four times a day (QID) | ORAL | Status: DC | PRN

## 2015-02-15 MED ORDER — PREGABALIN 100 MG PO CAPS: 100.0000 mg | ORAL_CAPSULE | Freq: Three times a day (TID) | ORAL | Status: DC

## 2015-02-15 NOTE — Progress Notes (Signed)
Patient's Name: Tiffany Herring MRN: [id number] DOB: 07-15-1960 DOS: 02/15/2015  Primary Reason(s) for Visit: Encounter for Medication Management CC: Arm Pain; Hip Pain; and Flank Pain   HPI  Tiffany Herring is a 55 y.o. year old, female patient, who returns today as an established patient. She has Chronic pain; Failed back surgical syndrome; Epidural fibrosis; Chronic low back pain; Chronic lumbar radicular pain (Right); Long term current use of opiate analgesic; Long term prescription opiate use; Opiate use; Opiate dependence (HCC); Encounter for therapeutic drug level monitoring; Allergic rhinitis; Carotid artery bruit; Chronic kidney disease (CKD), stage III (moderate); Colon polyp; Diabetes mellitus (HCC); Dysthymia; Essential (primary) hypertension; Generalized OA; Acid reflux; History of intermenstrual bleeding; History of migraine headaches; H/O nutritional disorder; Hypercholesterolemia; Type 2 diabetes mellitus treated with insulin (HCC); Other long term (current) drug therapy; Menopausal and perimenopausal disorder; Class III Morbid obesity (HCC) (254% higher incidence of chronic low back pain); Multiple sclerosis (HCC); Obstructive apnea; Degenerative arthritis of hip; Awareness of heartbeats; History of colon polyps; Post menopausal syndrome; Pure hypercholesterolemia; Enlargement of spleen; B12 deficiency; Fibromyalgia; Lumbar spondylosis; Grade 1 Anterolisthesis of L4 over L5; Chronic sacroiliac joint pain (bilateral); Chronic hip pain (Left); Arthropathy of left hip (Severe chronic left hip DJD); Osteoarthritis of hip (Left); Lumbar facet arthropathy; Lumbar facet hypertrophy; Lumbar facet syndrome; Chronic shoulder pain (Location of Primary Source of Pain) (Right); Chronic neck pain (Location of Secondary source of pain) (Right); Chronic flank pain (Right); and Chronic lower extremity pain (Right) on her problem list.. Her primarily concern today is the Arm Pain; Hip Pain; and Flank  Pain   The patient comes in today clinics today for pharmacological management of her chronic pain. She currently is using approximately 80 MME/day. She recently had a flareup of her pain where she was taken out of her multiple sclerosis medicine. Unfortunately, she had to do this due to the renal function going down.  Reported Pain Score: 8 , clinically she looks like a 4-5/10. Reported level is inconsistent with clinical obrservations. Pain Type: Chronic pain Pain Location: Arm Pain Orientation: Right, Upper Pain Descriptors / Indicators: Sharp, Spasm Pain Frequency: Intermittent  Date of Last Visit: 11/16/14 Service Provided on Last Visit: Med Refill  Pharmacotherapy  Medication(s): MS Contin 30 mg every 12 hours plus Norco 5/325 one tablet every 6 hours when necessary for breakthrough pain Onset of action: Within expected pharmacological parameters Time to Peak effect: Timing and results are as within normal expected parameters Analgesic Effect: More than 50% Activity Facilitation: Medication(s) allow patient to sit, stand, walk, and do the basic ADLs Perceived Effectiveness: Described as relatively effective, allowing for increase in activities of daily living (ADL) Side-effects or Adverse reactions: None reported Duration of action: Within normal limits for medication [place] PMP: Compliant with practice rules and regulations UDS Results: Last UDS done on 11/16/2014 came back within normal limits with no unexpected results. UDS Interpretation: Patient appears to be compliant with practice rules and regulations Medication Assessment Form: Reviewed. Patient indicates being compliant with therapy Treatment compliance: Compliant Substance Use Disorder (SUD) Risk Level: Low Pharmacologic Plan: Continue therapy as is  Lab Work: Illicit Drugs No results found for: THCU, COCAINSCRNUR, PCPSCRNUR, MDMA, AMPHETMU, METHADONE, ETOH  Inflammation Markers Lab Results  Component Value Date    ESRSEDRATE 28 02/15/2015    Renal Function Lab Results  Component Value Date   BUN 27* 02/15/2015   CREATININE 1.42* 02/15/2015   GFRAA 47* 02/15/2015   GFRNONAA 41* 02/15/2015    Hepatic  Function Lab Results  Component Value Date   AST 16 02/15/2015   ALT 19 02/15/2015   ALBUMIN 4.3 02/15/2015    Electrolytes Lab Results  Component Value Date   NA 138 02/15/2015   K 4.8 02/15/2015   CL 102 02/15/2015   CALCIUM 9.4 02/15/2015   MG 2.0 02/15/2015    Allergies  Tiffany Herring is allergic to 2,4-d dimethylamine (amisol); augmentin; sulfa antibiotics; and mobic.  Meds  The patient has a current medication list which includes the following prescription(s): albuterol, aspirin ec, bupropion, vitamin d3, furosemide, glucose blood, hydrocodone-acetaminophen, hydrocodone-acetaminophen, hydrocodone-acetaminophen, insulin nph-regular human, insulin regular, accu-chek multiclix, mirtazapine, montelukast, morphine, morphine, morphine, oxybutynin, tizanidine, vitamin b-12, and pregabalin.  Current Outpatient Prescriptions on File Prior to Visit  Medication Sig  . aspirin EC 81 MG tablet Take 81 mg by mouth daily.   . Cholecalciferol (VITAMIN D3) 5000 UNITS CAPS Take 5,000 Units by mouth daily.   Marland Kitchen glucose blood test strip 1 each as needed.   . insulin NPH-regular Human (NOVOLIN 70/30) (70-30) 100 UNIT/ML injection Inject 36 units SQ 30 minutes before breakfast.  . insulin regular (NOVOLIN R,HUMULIN R) 100 units/mL injection SSI: 151-200: 2 units, 201-250: 4 units, 251-300:6 units, 301-350:8 units, 351-400: 10 units > 400 call MD  . Lancets (ACCU-CHEK MULTICLIX) lancets 1 each.  Marland Kitchen tiZANidine (ZANAFLEX) 4 MG tablet Take 4 mg by mouth every 6 (six) hours.   . vitamin B-12 (CYANOCOBALAMIN) 1000 MCG tablet 1,000 mcg. Take 1,000 mcg by mouth daily.   No current facility-administered medications on file prior to visit.    ROS  Constitutional: Afebrile, no chills, well hydrated and well  nourished Gastrointestinal: negative Musculoskeletal:negative Neurological: negative Behavioral/Psych: negative  PFSH  Medical:  Tiffany Herring  has a past medical history of Allergy; Arthritis; Asthma; Depression; Diabetes mellitus without complication (HCC); Sleep apnea; Hypertension; Chronic kidney disease; Neuromuscular disorder (HCC); and Multiple sclerosis (HCC). Family: family history includes Dementia in her mother; Depression in her mother; Diabetes in her mother; Hyperlipidemia in her mother; Stroke in her father. Surgical:  has past surgical history that includes Cesarean section (1991 and 1996). Tobacco:  reports that she has quit smoking. She does not have any smokeless tobacco history on file. Alcohol:  reports that she does not drink alcohol. Drug:  reports that she does not use illicit drugs.  Physical Exam  Vitals:  Today's Vitals   02/15/15 1359 02/15/15 1400  BP: 135/64   Pulse: 72   Temp: 98.6 F (37 C)   TempSrc: Oral   Resp: 18   Height: 5\' 2"  (1.575 m)   Weight: 301 lb (136.533 kg)   SpO2: 100%   PainSc: 8  8   PainLoc: Generalized     Calculated BMI: Body mass index is 55.04 kg/(m^2).  General appearance: alert, cooperative, appears stated age, mild distress, morbidly obese and sitting on a wheelchair. Eyes: PERLA Respiratory: No evidence respiratory distress, no audible rales or ronchi and no use of accessory muscles of respiration  Cervical Spine Inspection: Normal anatomy Alignment: Symetrical ROM: Adequate  Upper Extremities Inspection: No gross anomalies detected ROM: Adequate Sensory: Normal Motor: Unremarkable   Thoracic Spine Inspection: No gross anomalies detected Alignment: Symetrical ROM: Adequate Palpation: WNL  Lumbar Spine Inspection: No gross anomalies detected Alignment: Symetrical ROM: Adequate Gait: Ambulating in a wheelchair.  Lower Extremities Inspection: No gross anomalies detected ROM: Decreased Sensory:   Dysesthesias Motor: Weakness  Assessment & Plan  Primary Diagnosis & Pertinent Problem List: The  primary encounter diagnosis was Chronic pain. Diagnoses of Chronic low back pain, Fibromyalgia, Encounter for therapeutic drug level monitoring, Long term current use of opiate analgesic, Multiple sclerosis (HCC), Chronic shoulder pain (Right), Chronic neck pain (Location of Secondary source of pain) (Right), Chronic flank pain (Right), and Chronic lower extremity pain (Right) were also pertinent to this visit.  Visit Diagnosis: 1. Chronic pain   2. Chronic low back pain   3. Fibromyalgia   4. Encounter for therapeutic drug level monitoring   5. Long term current use of opiate analgesic   6. Multiple sclerosis (HCC)   7. Chronic shoulder pain (Right)   8. Chronic neck pain (Location of Secondary source of pain) (Right)   9. Chronic flank pain (Right)   10. Chronic lower extremity pain (Right)     Assessment: No problem-specific assessment & plan notes found for this encounter.   Plan of Care  Pharmacotherapy (Medications Ordered): Meds ordered this encounter  Medications  . HYDROcodone-acetaminophen (NORCO/VICODIN) 5-325 MG tablet    Sig: Take 1 tablet by mouth every 6 (six) hours as needed for moderate pain or severe pain.    Dispense:  120 tablet    Refill:  0    Do not place this medication, or any other prescription from our practice, on "Automatic Refill". Patient may have prescription filled one day early if pharmacy is closed on scheduled refill date. Do not fill until: 02/18/15 To last until: 03/20/15  . HYDROcodone-acetaminophen (NORCO/VICODIN) 5-325 MG tablet    Sig: Take 1 tablet by mouth every 6 (six) hours as needed for moderate pain or severe pain.    Dispense:  120 tablet    Refill:  0    Do not place this medication, or any other prescription from our practice, on "Automatic Refill". Patient may have prescription filled one day early if pharmacy is closed on  scheduled refill date. Do not fill until: 03/20/15 To last until: 04/19/15  . HYDROcodone-acetaminophen (NORCO/VICODIN) 5-325 MG tablet    Sig: Take 1 tablet by mouth every 6 (six) hours as needed for moderate pain or severe pain.    Dispense:  120 tablet    Refill:  0    Do not place this medication, or any other prescription from our practice, on "Automatic Refill". Patient may have prescription filled one day early if pharmacy is closed on scheduled refill date. Do not fill until: 04/19/15 To last until: 05/19/15  . morphine (MS CONTIN) 30 MG 12 hr tablet    Sig: Take 1 tablet (30 mg total) by mouth every 8 (eight) hours as needed for pain.    Dispense:  90 tablet    Refill:  0    Do not place this medication, or any other prescription from our practice, on "Automatic Refill". Patient may have prescription filled one day early if pharmacy is closed on scheduled refill date. Do not fill until: 02/18/15 To last until: 03/20/15  . morphine (MS CONTIN) 30 MG 12 hr tablet    Sig: Take 1 tablet (30 mg total) by mouth every 8 (eight) hours as needed for pain.    Dispense:  90 tablet    Refill:  0    Do not place this medication, or any other prescription from our practice, on "Automatic Refill". Patient may have prescription filled one day early if pharmacy is closed on scheduled refill date. Do not fill until: 03/20/15 To last until: 04/19/15  . morphine (MS CONTIN) 30 MG 12 hr  tablet    Sig: Take 1 tablet (30 mg total) by mouth every 8 (eight) hours as needed for pain.    Dispense:  90 tablet    Refill:  0    Do not place this medication, or any other prescription from our practice, on "Automatic Refill". Patient may have prescription filled one day early if pharmacy is closed on scheduled refill date. Do not fill until: 04/19/15 To last until: 05/19/15  . DISCONTD: pregabalin (LYRICA) 100 MG capsule    Sig: Take 1 capsule (100 mg total) by mouth 3 (three) times daily.    Dispense:  90  capsule    Refill:  2    Do not place this medication, or any other prescription from our practice, on "Automatic Refill". Patient may have prescription filled one day early if pharmacy is closed on scheduled refill date. Do not fill until: 02/18/15 To last until: 05/19/15  . pregabalin (LYRICA) 150 MG capsule    Sig: Take 1 capsule (150 mg total) by mouth every 8 (eight) hours.    Dispense:  90 capsule    Refill:  2    Do not place this medication, or any other prescription from our practice, on "Automatic Refill". Patient may have prescription filled one day early if pharmacy is closed on scheduled refill date.    Lab-work & Procedure Ordered: Orders Placed This Encounter  Procedures  . Comprehensive metabolic panel    Order Specific Question:  Has the patient fasted?    Answer:  No  . C-reactive protein  . Magnesium  . Sedimentation rate  . Vitamin B12    Indication: Bone Pain (M89.9)  . Vitamin D pnl(25-hydrxy+1,25-dihy)-bld  . Drugs of abuse screen w/o alc, rtn urine-sln    Volume: 10 ml(s). Minimum 3 ml of urine is needed. Document temperature of fresh sample. Indications: Long term (current) use of opiate analgesic (Z79.891) Test#: [id number] ([employer] Select-13)    Imaging Ordered: None  Interventional Therapies: Scheduled: None at this time. PRN Procedures: None at this time.    Referral(s) or Consult(s): None at this time.  Medications administered during this visit: Ms. Vandagriff had no medications administered during this visit.  Future Appointments Date Time Provider Department Center  05/10/2015 11:00 AM Delano Metz, MD [facility] None    Primary Care Physician: Rolm Gala, MD Location: [facility] Note by: Sydnee Levans. Laban Emperor, M.D, DABA, DABAPM, DABPM, DABIPP, FIPP

## 2015-02-15 NOTE — Patient Instructions (Signed)
Instructed to go to pre admit testing  and get labwork drawn.

## 2015-02-15 NOTE — Progress Notes (Signed)
Pill count: Lyrica: #18 Hydrocodone #24 MS Contin #18

## 2015-02-16 LAB — C-REACTIVE PROTEIN: CRP: 2 mg/dL — ABNORMAL HIGH (ref <1.0)

## 2015-02-20 LAB — TOXASSURE SELECT 13 (MW), URINE: PDF: 0

## 2015-02-20 NOTE — Progress Notes (Signed)
Quick Note:  Lab results reviewed and found to be within normal limits. ______ 

## 2015-02-20 NOTE — Progress Notes (Signed)
Quick Note:   Normal fasting (NPO x 8 hours) glucose levels are between 65-99 mg/dl, with 2 hour fasting, levels are usually less than 140 mg/dl. Any random blood glucose level greater than 200 mg/dl is considered to be Diabetes.  BUN levels between 7 to 20 mg/dL (2.5 to 7.1 mmol/L) are considered normal. Elevated blood urea nitrogen can also be due to: urinary tract obstruction; congestive heart failure or recent heart attack; gastrointestinal bleeding; dehydration; shock; severe burns; certain medications, such as corticosteroids and some antibiotics; and/or a high protein diet.  Normal Creatinine levels are between 0.5 and 0.9 mg/dl for our lab. Any condition that impairs the function of the kidneys is likely to raise the creatinine level in the blood. The most common causes of longstanding (chronic) kidney disease in adults are high blood pressure and diabetes. Other causes of elevated blood creatinine levels include drugs, ingestion of a large amount of dietary meat, kidney infections, rhabdomyolysis (abnormal muscle breakdown), and urinary tract obstruction.  eGFR (Estimated Glomerular Filtration Rate) results are reported as milliliters/minute/1.33m (mL/min/1.774m. Because some laboratories do not collect information on a patient's race when the sample is collected for testing, they may report calculated results for both African Americans and non-African Americans.  The NaNationwide Mutual InsuranceNEncompass Health Rehabilitation Of Scottsdalesuggests only reporting actual results once values are < 60 mL/min. 1. Normal values: 90-120 mL/min 2. Below 60 mL/min suggests that some kidney damage has occurred. 3. Between 5963nd 30 indicate (Moderate) Stage 3 kidney disease. 4. Between 29 and 15 represent (Severe) Stage 4 kidney disease. 5. Less than 15 is considered (Kidney Failure) Stage 5. ______

## 2015-02-20 NOTE — Progress Notes (Signed)

## 2015-05-10 ENCOUNTER — Encounter: Payer: Self-pay | Admitting: Pain Medicine

## 2015-05-10 ENCOUNTER — Ambulatory Visit: Payer: BLUE CROSS/BLUE SHIELD | Attending: Pain Medicine | Admitting: Pain Medicine

## 2015-05-10 VITALS — BP 117/65 | HR 78 | Temp 98.1°F | Resp 16 | Ht 62.0 in | Wt 302.0 lb

## 2015-05-10 DIAGNOSIS — M25511 Pain in right shoulder: Secondary | ICD-10-CM | POA: Diagnosis not present

## 2015-05-10 DIAGNOSIS — E119 Type 2 diabetes mellitus without complications: Secondary | ICD-10-CM | POA: Insufficient documentation

## 2015-05-10 DIAGNOSIS — I129 Hypertensive chronic kidney disease with stage 1 through stage 4 chronic kidney disease, or unspecified chronic kidney disease: Secondary | ICD-10-CM | POA: Diagnosis not present

## 2015-05-10 DIAGNOSIS — K219 Gastro-esophageal reflux disease without esophagitis: Secondary | ICD-10-CM | POA: Diagnosis not present

## 2015-05-10 DIAGNOSIS — M12852 Other specific arthropathies, not elsewhere classified, left hip: Secondary | ICD-10-CM | POA: Diagnosis not present

## 2015-05-10 DIAGNOSIS — E639 Nutritional deficiency, unspecified: Secondary | ICD-10-CM | POA: Insufficient documentation

## 2015-05-10 DIAGNOSIS — Z87891 Personal history of nicotine dependence: Secondary | ICD-10-CM | POA: Insufficient documentation

## 2015-05-10 DIAGNOSIS — E538 Deficiency of other specified B group vitamins: Secondary | ICD-10-CM | POA: Diagnosis not present

## 2015-05-10 DIAGNOSIS — G8929 Other chronic pain: Secondary | ICD-10-CM

## 2015-05-10 DIAGNOSIS — R161 Splenomegaly, not elsewhere classified: Secondary | ICD-10-CM | POA: Insufficient documentation

## 2015-05-10 DIAGNOSIS — G43909 Migraine, unspecified, not intractable, without status migrainosus: Secondary | ICD-10-CM | POA: Diagnosis not present

## 2015-05-10 DIAGNOSIS — R109 Unspecified abdominal pain: Secondary | ICD-10-CM | POA: Diagnosis not present

## 2015-05-10 DIAGNOSIS — J309 Allergic rhinitis, unspecified: Secondary | ICD-10-CM | POA: Diagnosis not present

## 2015-05-10 DIAGNOSIS — J45909 Unspecified asthma, uncomplicated: Secondary | ICD-10-CM | POA: Insufficient documentation

## 2015-05-10 DIAGNOSIS — M545 Low back pain: Secondary | ICD-10-CM | POA: Diagnosis not present

## 2015-05-10 DIAGNOSIS — G35 Multiple sclerosis: Secondary | ICD-10-CM | POA: Insufficient documentation

## 2015-05-10 DIAGNOSIS — M25552 Pain in left hip: Secondary | ICD-10-CM | POA: Diagnosis not present

## 2015-05-10 DIAGNOSIS — Z86010 Personal history of colonic polyps: Secondary | ICD-10-CM | POA: Insufficient documentation

## 2015-05-10 DIAGNOSIS — E7800 Pure hypercholesterolemia, unspecified: Secondary | ICD-10-CM | POA: Diagnosis not present

## 2015-05-10 DIAGNOSIS — Z79891 Long term (current) use of opiate analgesic: Secondary | ICD-10-CM | POA: Diagnosis not present

## 2015-05-10 DIAGNOSIS — M533 Sacrococcygeal disorders, not elsewhere classified: Secondary | ICD-10-CM | POA: Diagnosis not present

## 2015-05-10 DIAGNOSIS — M47816 Spondylosis without myelopathy or radiculopathy, lumbar region: Secondary | ICD-10-CM | POA: Diagnosis not present

## 2015-05-10 DIAGNOSIS — M4316 Spondylolisthesis, lumbar region: Secondary | ICD-10-CM | POA: Diagnosis not present

## 2015-05-10 DIAGNOSIS — M542 Cervicalgia: Secondary | ICD-10-CM | POA: Insufficient documentation

## 2015-05-10 DIAGNOSIS — Z5181 Encounter for therapeutic drug level monitoring: Secondary | ICD-10-CM

## 2015-05-10 DIAGNOSIS — G9619 Other disorders of meninges, not elsewhere classified: Secondary | ICD-10-CM | POA: Diagnosis not present

## 2015-05-10 DIAGNOSIS — M25559 Pain in unspecified hip: Secondary | ICD-10-CM | POA: Diagnosis present

## 2015-05-10 DIAGNOSIS — M1612 Unilateral primary osteoarthritis, left hip: Secondary | ICD-10-CM | POA: Diagnosis not present

## 2015-05-10 DIAGNOSIS — M797 Fibromyalgia: Secondary | ICD-10-CM | POA: Insufficient documentation

## 2015-05-10 DIAGNOSIS — G4733 Obstructive sleep apnea (adult) (pediatric): Secondary | ICD-10-CM | POA: Insufficient documentation

## 2015-05-10 DIAGNOSIS — F1190 Opioid use, unspecified, uncomplicated: Secondary | ICD-10-CM

## 2015-05-10 MED ORDER — HYDROCODONE-ACETAMINOPHEN 5-325 MG PO TABS: 1.0000 | ORAL_TABLET | Freq: Four times a day (QID) | ORAL | Status: DC | PRN

## 2015-05-10 MED ORDER — MORPHINE SULFATE ER 30 MG PO TBCR: 30.0000 mg | EXTENDED_RELEASE_TABLET | Freq: Three times a day (TID) | ORAL | Status: DC | PRN

## 2015-05-10 MED ORDER — PREGABALIN 150 MG PO CAPS: 150.0000 mg | ORAL_CAPSULE | Freq: Three times a day (TID) | ORAL | Status: DC

## 2015-05-10 NOTE — Progress Notes (Signed)
Patient's Name: Tiffany Herring  Patient type: Established  MRN: [id number]  Service setting: Ambulatory outpatient  DOB: 1960/05/10  Location: [facility]  DOS: 05/10/2015  Primary Care Physician: Rolm Gala, MD  Note by: Sydnee Levans. Laban Emperor, M.D, DABA, DABAPM, DABPM, Olga Coaster, FIPP  Referring Physician: Rolm Gala, MD  Specialty: Board-Certified Interventional Pain Management  Last Visit to Pain Management: 02/15/2015   Primary Reason(s) for Visit: Encounter for prescription drug management (Level of risk: moderate) CC: Hip Pain   HPI  Tiffany Herring is a 55 y.o. year old, female patient, who returns today as an established patient. She has Chronic pain; Failed back surgical syndrome; Epidural fibrosis; Chronic low back pain; Chronic lumbar radicular pain (Right); Long term current use of opiate analgesic; Long term prescription opiate use; Opiate use (80 MME/Day); Opiate dependence (HCC); Encounter for therapeutic drug level monitoring; Allergic rhinitis; Carotid artery bruit; Chronic kidney disease (CKD), stage III (moderate); Colon polyp; Diabetes mellitus (HCC); Dysthymia; Essential (primary) hypertension; Generalized OA; Acid reflux; History of intermenstrual bleeding; History of migraine headaches; H/O nutritional disorder; Hypercholesterolemia; Type 2 diabetes mellitus treated with insulin (HCC); Other long term (current) drug therapy; Menopausal and perimenopausal disorder; Class III Morbid obesity (HCC) (254% higher incidence of chronic low back pain); Multiple sclerosis (HCC); Obstructive apnea; Degenerative arthritis of hip; Awareness of heartbeats; History of colon polyps; Post menopausal syndrome; Pure hypercholesterolemia; Enlargement of spleen; B12 deficiency; Fibromyalgia; Lumbar spondylosis; Grade 1 Anterolisthesis of L4 over L5; Chronic sacroiliac joint pain (bilateral); Chronic hip pain (Left); Arthropathy of left hip (Severe chronic left hip DJD);  Osteoarthritis of hip (Left); Lumbar facet arthropathy; Lumbar facet hypertrophy; Lumbar facet syndrome; Chronic shoulder pain (Location of Primary Source of Pain) (Right); Chronic neck pain (Location of Secondary source of pain) (Right); Chronic flank pain (Right); and Chronic lower extremity pain (Right) on her problem list.. Her primarily concern today is the Hip Pain   Pain Assessment: Self-Reported Pain Score: 3  Reported level is compatible with observation Pain Type: Chronic pain Pain Location: Hip Pain Orientation: Left (due to MS) Pain Descriptors / Indicators: Aching, Constant, Sharp, Shooting (more sharp pain when moving such as getting out of the car) Pain Frequency: Constant  The patient comes into the clinics today for pharmacological management of her chronic pain. I last saw this patient on 02/15/2015. Her body mass index is 55.22 kg/(m^2). The patient  reports that she does not use illicit drugs.  Date of Last Visit: 02/15/15 Service Provided on Last Visit: Med Refill  Controlled Substance Pharmacotherapy Assessment & REMS (Risk Evaluation and Mitigation Strategy)  Analgesic: No change since the last visit (see below). Pill Count: Please see the nurse's note for latest pill count information. MME/day: No change since the last visit (see below). Date of Last Visit: 02/15/15 Pharmacokinetics: Onset of action (Liberation/Absorption): Within expected pharmacological parameters Time to Peak effect (Distribution): Timing and results are as within normal expected parameters Duration of action (Metabolism/Excretion): Within normal limits for medication Pharmacodynamics: Analgesic Effect: More than 50% Activity Facilitation: Medication(s) allow patient to sit, stand, walk, and do the basic ADLs Perceived Effectiveness: Described as relatively effective, allowing for increase in activities of daily living (ADL) Side-effects or Adverse reactions: None reported Monitoring: [place] PMP:  Online review of the past 75-month period conducted. Compliant with practice rules and regulations UDS Results/interpretation: Last UDS done on 02/15/2015 came back within normal limits with no unexpected results. Medication Assessment Form: Reviewed. Patient indicates being compliant with therapy Treatment compliance:  Compliant Risk Assessment: Aberrant Behavior: None observed today Substance Use Disorder (SUD) Risk Level: No change since last visit Risk of opioid abuse or dependence: 0.7-3.0% with doses ? 36 MME/day and 6.1-26% with doses ? 120 MME/day. Opioid Risk Tool (ORT) Score: Total Score: 1 Low Risk for SUD (Score <3) Depression Scale Score: PHQ-2: PHQ-2 Total Score: 0 No depression (0) PHQ-9: PHQ-9 Total Score: 0 No depression (0-4)  Pharmacologic Plan: No change in therapy, at this time  Laboratory Chemistry  Inflammation Markers Lab Results  Component Value Date   ESRSEDRATE 28 02/15/2015   CRP 2.0* 02/15/2015    Renal Function Lab Results  Component Value Date   BUN 27* 02/15/2015   CREATININE 1.42* 02/15/2015   GFRAA 47* 02/15/2015   GFRNONAA 41* 02/15/2015    Hepatic Function Lab Results  Component Value Date   AST 16 02/15/2015   ALT 19 02/15/2015   ALBUMIN 4.3 02/15/2015    Electrolytes Lab Results  Component Value Date   NA 138 02/15/2015   K 4.8 02/15/2015   CL 102 02/15/2015   CALCIUM 9.4 02/15/2015   MG 2.0 02/15/2015    Pain Modulating Vitamins No results found for: VD25OH, VD125OH2TOT, ZO1096EA5, [id number], VITAMINB12  Coagulation Parameters No results found for: INR, LABPROT  Note: I personally reviewed the above data. Results made available to patient.  Recent Diagnostic Imaging  No results found.  Meds  The patient has a current medication list which includes the following prescription(s): albuterol, aspirin ec, b-d ins syr ultrafine 1cc/30g, bupropion, vitamin d3, enalapril, furosemide, glucose blood, hydrocodone-acetaminophen,  hydrocodone-acetaminophen, hydrocodone-acetaminophen, insulin nph-regular human, insulin regular, insulin syringe .3cc/29gx1/2", accu-chek multiclix, mirtazapine, montelukast, morphine, morphine, morphine, oxybutynin, pregabalin, tizanidine, ulticare insulin syringe, and vitamin b-12.  Current Outpatient Prescriptions on File Prior to Visit  Medication Sig  . albuterol (PROAIR HFA) 108 (90 Base) MCG/ACT inhaler Inhale 1 puff into the lungs as needed.   Marland Kitchen aspirin EC 81 MG tablet Take 81 mg by mouth daily.   Marland Kitchen buPROPion (WELLBUTRIN SR) 150 MG 12 hr tablet Take 150 mg by mouth 2 (two) times daily.   . Cholecalciferol (VITAMIN D3) 5000 UNITS CAPS Take 5,000 Units by mouth daily.   . furosemide (LASIX) 20 MG tablet Take 20 mg by mouth daily.   Marland Kitchen glucose blood test strip 1 each as needed.   . insulin NPH-regular Human (NOVOLIN 70/30) (70-30) 100 UNIT/ML injection Inject 36 units SQ 30 minutes before breakfast.  . insulin regular (NOVOLIN R,HUMULIN R) 100 units/mL injection SSI: 151-200: 2 units, 201-250: 4 units, 251-300:6 units, 301-350:8 units, 351-400: 10 units > 400 call MD  . Lancets (ACCU-CHEK MULTICLIX) lancets 1 each.  . mirtazapine (REMERON) 15 MG tablet Take 15 mg by mouth at bedtime.   . montelukast (SINGULAIR) 10 MG tablet Take 10 mg by mouth daily.   Marland Kitchen oxybutynin (DITROPAN XL) 10 MG 24 hr tablet Take 10 mg by mouth daily.   Marland Kitchen tiZANidine (ZANAFLEX) 4 MG tablet Take 4 mg by mouth every 6 (six) hours.   . vitamin B-12 (CYANOCOBALAMIN) 1000 MCG tablet 1,000 mcg. Take 1,000 mcg by mouth daily.   No current facility-administered medications on file prior to visit.    ROS  Constitutional: Denies any fever or chills Gastrointestinal: No reported hemesis, hematochezia, vomiting, or acute GI distress Musculoskeletal: Denies any acute onset joint swelling, redness, loss of ROM, or weakness Neurological: No reported episodes of acute onset apraxia, aphasia, dysarthria, agnosia, amnesia,  paralysis, loss of coordination, or  loss of consciousness  Allergies  Tiffany Herring is allergic to 2,4-d dimethylamine (amisol); augmentin; sulfa antibiotics; and mobic.  PFSH  Medical:  Tiffany Herring  has a past medical history of Allergy; Arthritis; Asthma; Depression; Diabetes mellitus without complication (HCC); Sleep apnea; Hypertension; Chronic kidney disease; Neuromuscular disorder (HCC); and Multiple sclerosis (HCC). Family: family history includes Dementia in her mother; Depression in her mother; Diabetes in her mother; Hyperlipidemia in her mother; Stroke in her father. Surgical:  has past surgical history that includes Cesarean section (1991 and 1996). Tobacco:  reports that she has quit smoking. She does not have any smokeless tobacco history on file. Alcohol:  reports that she does not drink alcohol. Drug:  reports that she does not use illicit drugs.  Constitutional Exam  Vitals: Blood pressure 117/65, pulse 78, temperature 98.1 F (36.7 C), temperature source Oral, resp. rate 16, height 5\' 2"  (1.575 m), weight 302 lb (136.986 kg), SpO2 95 %. General appearance: Well nourished, well developed, and well hydrated. In no acute distress Calculated BMI/Body habitus: Body mass index is 55.22 kg/(m^2).       Psych/Mental status: Alert and oriented x 3 (person, place, & time) Eyes: PERLA Respiratory: No evidence of acute respiratory distress  Cervical Spine Exam  Inspection: No masses, redness, or swelling Alignment: Symmetrical ROM: Functional: Unrestricted ROM Active: Adequate ROM Stability: No instability detected Muscle strength & Tone: Functionally intact Sensory: Unimpaired Palpation: No complaints of tenderness  Upper Extremity (UE) Exam    Side: Right upper extremity  Side: Left upper extremity  Inspection: No masses, redness, swelling, or asymmetry  Inspection: No masses, redness, swelling, or asymmetry  ROM:  ROM:  Functional: Unrestricted ROM  Functional:  Unrestricted ROM  Active: Adequate ROM  Active: Adequate ROM  Muscle strength & Tone: Functionally intact  Muscle strength & Tone: Functionally intact  Sensory: Unimpaired  Sensory: Unimpaired  Palpation: Non-contributory  Palpation: Non-contributory   Thoracic Spine Exam  Inspection: No masses, redness, or swelling Alignment: Symmetrical ROM: Functional: Unrestricted ROM Active: Adequate ROM Stability: No instability detected Sensory: Unimpaired Muscle strength & Tone: Functionally intact Palpation: No complaints of tenderness  Lumbar Spine Exam  Inspection: No masses, redness, or swelling Alignment: Symmetrical ROM: Functional: Unrestricted ROM Active: Adequate ROM Stability: No instability detected Muscle strength & Tone: Functionally intact Sensory: Unimpaired Palpation: No complaints of tenderness Provocative Tests: Lumbar Hyperextension and rotation test: deferred Patrick's Maneuver: deferred  Gait & Posture Assessment  Gait: Unaffected Posture: WNL  Lower Extremity Exam    Side: Right lower extremity  Side: Left lower extremity  Inspection: No masses, redness, swelling, or asymmetry ROM:  Inspection: No masses, redness, swelling, or asymmetry ROM:  Functional: Unrestricted ROM  Functional: Unrestricted ROM  Active: Adequate ROM  Active: Adequate ROM  Muscle strength & Tone: Functionally intact  Muscle strength & Tone: Functionally intact  Sensory: Unimpaired  Sensory: Unimpaired  Palpation: Non-contributory  Palpation: Non-contributory   Assessment & Plan  Primary Diagnosis & Pertinent Problem List: The primary encounter diagnosis was Chronic pain. Diagnoses of Encounter for therapeutic drug level monitoring, Long term current use of opiate analgesic, Chronic shoulder pain (Location of Primary Source of Pain) (Right), Multiple sclerosis (HCC), Opiate use (80 MME/Day), and Fibromyalgia were also pertinent to this visit.  Visit Diagnosis: 1. Chronic pain   2.  Encounter for therapeutic drug level monitoring   3. Long term current use of opiate analgesic   4. Chronic shoulder pain (Location of Primary Source of Pain) (Right)  5. Multiple sclerosis (HCC)   6. Opiate use (80 MME/Day)   7. Fibromyalgia     Problems updated and reviewed during this visit: Problem  Opiate use (80 MME/Day)   Morphine ER 30 mg every 12 hours (60 mg/day) + hydrocodone/APAP 5/325 one every 6 hours (20 mg/day)     Problem-specific Plan(s): No problem-specific assessment & plan notes found for this encounter.  No new assessment & plan notes have been filed under this hospital service since the last note was generated. Service: Pain Management   Plan of Care   Problem List Items Addressed This Visit      High   Chronic pain - Primary (Chronic)   Relevant Medications   HYDROcodone-acetaminophen (NORCO/VICODIN) 5-325 MG tablet   HYDROcodone-acetaminophen (NORCO/VICODIN) 5-325 MG tablet   HYDROcodone-acetaminophen (NORCO/VICODIN) 5-325 MG tablet   morphine (MS CONTIN) 30 MG 12 hr tablet   morphine (MS CONTIN) 30 MG 12 hr tablet   morphine (MS CONTIN) 30 MG 12 hr tablet   pregabalin (LYRICA) 150 MG capsule   Chronic shoulder pain (Location of Primary Source of Pain) (Right) (Chronic)   Fibromyalgia (Chronic)   Relevant Medications   pregabalin (LYRICA) 150 MG capsule   Multiple sclerosis (HCC) (Chronic)     Medium   Encounter for therapeutic drug level monitoring   Long term current use of opiate analgesic (Chronic)   Relevant Orders   ToxASSURE Select 13 (MW), Urine   Opiate use (80 MME/Day) (Chronic)       Pharmacotherapy (Medications Ordered): Meds ordered this encounter  Medications  . HYDROcodone-acetaminophen (NORCO/VICODIN) 5-325 MG tablet    Sig: Take 1 tablet by mouth every 6 (six) hours as needed for severe pain.    Dispense:  120 tablet    Refill:  0    Do not place this medication, or any other prescription from our practice, on  "Automatic Refill". Patient may have prescription filled one day early if pharmacy is closed on scheduled refill date. Do not fill until: 05/19/15 To last until: 06/18/15  . HYDROcodone-acetaminophen (NORCO/VICODIN) 5-325 MG tablet    Sig: Take 1 tablet by mouth every 6 (six) hours as needed for severe pain.    Dispense:  120 tablet    Refill:  0    Do not place this medication, or any other prescription from our practice, on "Automatic Refill". Patient may have prescription filled one day early if pharmacy is closed on scheduled refill date. Do not fill until: 06/18/15 To last until: 07/18/15  . HYDROcodone-acetaminophen (NORCO/VICODIN) 5-325 MG tablet    Sig: Take 1 tablet by mouth every 6 (six) hours as needed for severe pain.    Dispense:  120 tablet    Refill:  0    Do not place this medication, or any other prescription from our practice, on "Automatic Refill". Patient may have prescription filled one day early if pharmacy is closed on scheduled refill date. Do not fill until: 07/18/15 To last until: 08/17/15  . morphine (MS CONTIN) 30 MG 12 hr tablet    Sig: Take 1 tablet (30 mg total) by mouth every 8 (eight) hours as needed for pain.    Dispense:  90 tablet    Refill:  0    Do not place this medication, or any other prescription from our practice, on "Automatic Refill". Patient may have prescription filled one day early if pharmacy is closed on scheduled refill date. Do not fill until: 05/19/15 To last until: 06/18/15  .  morphine (MS CONTIN) 30 MG 12 hr tablet    Sig: Take 1 tablet (30 mg total) by mouth every 8 (eight) hours as needed for pain.    Dispense:  90 tablet    Refill:  0    Do not place this medication, or any other prescription from our practice, on "Automatic Refill". Patient may have prescription filled one day early if pharmacy is closed on scheduled refill date. Do not fill until: 07/18/15 To last until: 08/17/15  . morphine (MS CONTIN) 30 MG 12 hr tablet     Sig: Take 1 tablet (30 mg total) by mouth every 8 (eight) hours as needed for pain.    Dispense:  90 tablet    Refill:  0    Do not place this medication, or any other prescription from our practice, on "Automatic Refill". Patient may have prescription filled one day early if pharmacy is closed on scheduled refill date. Do not fill until: 06/18/15 To last until: 07/18/15  . pregabalin (LYRICA) 150 MG capsule    Sig: Take 1 capsule (150 mg total) by mouth every 8 (eight) hours.    Dispense:  90 capsule    Refill:  2    Do not place this medication, or any other prescription from our practice, on "Automatic Refill". Patient may have prescription filled one day early if pharmacy is closed on scheduled refill date.    Lab-work & Procedure Ordered: Orders Placed This Encounter  Procedures  . ToxASSURE Select 13 (MW), Urine    Imaging Ordered: None  Interventional Therapies: Scheduled:  None at this time    Considering:  None at this time.    PRN Procedures:  None at this time.    Referral(s) or Consult(s): None at this time.  New Prescriptions   No medications on file    Medications administered during this visit: Tiffany Herring had no medications administered during this visit.  Requested PM Follow-up: Return in about 3 months (around 08/02/2015) for MedMgmt (3-Mo).  Future Appointments Date Time Provider Department Center  08/02/2015 1:00 PM Delano Metz, MD [facility] None    Primary Care Physician: Rolm Gala, MD Location: [facility] Note by: Sydnee Levans. Laban Emperor, M.D, DABA, DABAPM, DABPM, DABIPP, FIPP  Pain Score Disclaimer: We use the NRS-11 scale. This is a self-reported, subjective measurement of pain severity with only modest accuracy. It is used primarily to identify changes within a particular patient. It must be understood that outpatient pain scales are significantly less accurate that those used for research, where they can  be applied under ideal controlled circumstances with minimal exposure to variables. In reality, the score is likely to be a combination of pain intensity and pain affect, where pain affect describes the degree of emotional arousal or changes in action readiness caused by the sensory experience of pain. Factors such as social and work situation, setting, emotional state, anxiety levels, expectation, and prior pain experience may influence pain perception and show large inter-individual differences that may also be affected by time variables.

## 2015-05-10 NOTE — Progress Notes (Signed)
Safety precautions to be maintained throughout the outpatient stay will include: orient to surroundings, keep bed in low position, maintain call bell within reach at all times, provide assistance with transfer out of bed and ambulation. Pill count is lyrica  150 capsules 24/90 filled on 04/16/2015; hydrocodone 5/325 mg 46/120 tablets filled on 04/10/2017andmorphine sulfate er 30 mg tablets are 29/90 filled on 04/19/2015

## 2015-05-15 LAB — TOXASSURE SELECT 13 (MW), URINE

## 2015-05-16 NOTE — Progress Notes (Signed)
Quick Note:  The results of this urine drug screen (UDS) were reported as "Unexpected" due to the presence of an unreported substance. However, further investigation reveals this medication to be properly and legally prescribed to this patient. ______

## 2015-08-02 ENCOUNTER — Encounter: Payer: Medicare Other | Admitting: Pain Medicine

## 2015-08-05 ENCOUNTER — Encounter: Payer: Self-pay | Admitting: Pain Medicine

## 2015-08-05 ENCOUNTER — Ambulatory Visit: Payer: BLUE CROSS/BLUE SHIELD | Attending: Pain Medicine | Admitting: Pain Medicine

## 2015-08-05 VITALS — BP 107/59 | Temp 98.1°F | Resp 14 | Ht 61.0 in | Wt 305.0 lb

## 2015-08-05 DIAGNOSIS — Z87891 Personal history of nicotine dependence: Secondary | ICD-10-CM | POA: Diagnosis not present

## 2015-08-05 DIAGNOSIS — M533 Sacrococcygeal disorders, not elsewhere classified: Secondary | ICD-10-CM | POA: Diagnosis not present

## 2015-08-05 DIAGNOSIS — M4316 Spondylolisthesis, lumbar region: Secondary | ICD-10-CM | POA: Diagnosis not present

## 2015-08-05 DIAGNOSIS — M5416 Radiculopathy, lumbar region: Secondary | ICD-10-CM | POA: Diagnosis not present

## 2015-08-05 DIAGNOSIS — N183 Chronic kidney disease, stage 3 (moderate): Secondary | ICD-10-CM | POA: Insufficient documentation

## 2015-08-05 DIAGNOSIS — E7800 Pure hypercholesterolemia, unspecified: Secondary | ICD-10-CM | POA: Insufficient documentation

## 2015-08-05 DIAGNOSIS — R0989 Other specified symptoms and signs involving the circulatory and respiratory systems: Secondary | ICD-10-CM | POA: Diagnosis not present

## 2015-08-05 DIAGNOSIS — I129 Hypertensive chronic kidney disease with stage 1 through stage 4 chronic kidney disease, or unspecified chronic kidney disease: Secondary | ICD-10-CM | POA: Insufficient documentation

## 2015-08-05 DIAGNOSIS — G8929 Other chronic pain: Secondary | ICD-10-CM | POA: Diagnosis not present

## 2015-08-05 DIAGNOSIS — J309 Allergic rhinitis, unspecified: Secondary | ICD-10-CM | POA: Diagnosis not present

## 2015-08-05 DIAGNOSIS — G35 Multiple sclerosis: Secondary | ICD-10-CM | POA: Insufficient documentation

## 2015-08-05 DIAGNOSIS — G43909 Migraine, unspecified, not intractable, without status migrainosus: Secondary | ICD-10-CM | POA: Insufficient documentation

## 2015-08-05 DIAGNOSIS — E538 Deficiency of other specified B group vitamins: Secondary | ICD-10-CM | POA: Insufficient documentation

## 2015-08-05 DIAGNOSIS — F1190 Opioid use, unspecified, uncomplicated: Secondary | ICD-10-CM | POA: Diagnosis not present

## 2015-08-05 DIAGNOSIS — Z7189 Other specified counseling: Secondary | ICD-10-CM | POA: Diagnosis not present

## 2015-08-05 DIAGNOSIS — R109 Unspecified abdominal pain: Secondary | ICD-10-CM | POA: Diagnosis not present

## 2015-08-05 DIAGNOSIS — G9619 Other disorders of meninges, not elsewhere classified: Secondary | ICD-10-CM | POA: Insufficient documentation

## 2015-08-05 DIAGNOSIS — M797 Fibromyalgia: Secondary | ICD-10-CM

## 2015-08-05 DIAGNOSIS — G4733 Obstructive sleep apnea (adult) (pediatric): Secondary | ICD-10-CM | POA: Diagnosis not present

## 2015-08-05 DIAGNOSIS — Z86010 Personal history of colonic polyps: Secondary | ICD-10-CM | POA: Diagnosis not present

## 2015-08-05 DIAGNOSIS — E1122 Type 2 diabetes mellitus with diabetic chronic kidney disease: Secondary | ICD-10-CM | POA: Insufficient documentation

## 2015-08-05 DIAGNOSIS — M1612 Unilateral primary osteoarthritis, left hip: Secondary | ICD-10-CM | POA: Insufficient documentation

## 2015-08-05 DIAGNOSIS — M545 Low back pain: Secondary | ICD-10-CM | POA: Insufficient documentation

## 2015-08-05 DIAGNOSIS — M25552 Pain in left hip: Secondary | ICD-10-CM | POA: Diagnosis present

## 2015-08-05 DIAGNOSIS — Z1239 Encounter for other screening for malignant neoplasm of breast: Secondary | ICD-10-CM | POA: Insufficient documentation

## 2015-08-05 DIAGNOSIS — Z79891 Long term (current) use of opiate analgesic: Secondary | ICD-10-CM

## 2015-08-05 DIAGNOSIS — K219 Gastro-esophageal reflux disease without esophagitis: Secondary | ICD-10-CM | POA: Insufficient documentation

## 2015-08-05 DIAGNOSIS — M79604 Pain in right leg: Secondary | ICD-10-CM | POA: Diagnosis present

## 2015-08-05 MED ORDER — HYDROCODONE-ACETAMINOPHEN 5-325 MG PO TABS: 1.0000 | ORAL_TABLET | Freq: Four times a day (QID) | ORAL | 0 refills | Status: DC | PRN

## 2015-08-05 MED ORDER — MORPHINE SULFATE ER 30 MG PO TBCR: 30.0000 mg | EXTENDED_RELEASE_TABLET | Freq: Three times a day (TID) | ORAL | 0 refills | Status: DC | PRN

## 2015-08-05 MED ORDER — PREGABALIN 150 MG PO CAPS
150.0000 mg | ORAL_CAPSULE | Freq: Three times a day (TID) | ORAL | 2 refills | Status: DC
Start: 2015-08-05 — End: 2015-11-04

## 2015-08-05 NOTE — Progress Notes (Signed)
Morphine 30 mg - 45/90; filled 07/19/15 Hydrocodone 5/325 mg - 68/120; filled 07/19/15

## 2015-08-05 NOTE — Progress Notes (Signed)
Patient's Name: Tiffany Herring  Patient type: Established  MRN: [id number]  Service setting: Ambulatory outpatient  DOB: January 14, 1960  Location: [facility]  DOS: 08/05/2015  Primary Care Physician: Rolm Gala, MD  Note by: Sydnee Levans. Laban Emperor, M.D  Referring Physician: Rolm Gala, MD  Specialty: Interventional Pain Management  Last Visit to Pain Management: 08/02/2015   Primary Reason(s) for Visit: Encounter for prescription drug management (Level of risk: moderate) CC: Hip Pain (left)   HPI  Tiffany Herring is a 55 y.o. year old, female patient, who returns today as an established patient. She has Chronic pain; Failed back surgical syndrome; Epidural fibrosis; Chronic low back pain; Chronic lumbar radicular pain (Right); Long term current use of opiate analgesic; Long term prescription opiate use; Opiate use (80 MME/Day); Opiate dependence (HCC); Encounter for therapeutic drug level monitoring; Allergic rhinitis; Carotid artery bruit; Chronic kidney disease (CKD), stage III (moderate); Colon polyp; Diabetes mellitus (HCC); Dysthymia; Essential (primary) hypertension; Generalized OA; Acid reflux; History of intermenstrual bleeding; History of migraine headaches; H/O nutritional disorder; Hypercholesterolemia; Type 2 diabetes mellitus treated with insulin (HCC); Other long term (current) drug therapy; Menopausal and perimenopausal disorder; Class III Morbid obesity (HCC) (254% higher incidence of chronic low back pain); Multiple sclerosis (HCC); Obstructive apnea; Degenerative arthritis of hip; Awareness of heartbeats; History of colon polyps; Post menopausal syndrome; Pure hypercholesterolemia; Enlargement of spleen; B12 deficiency; Fibromyalgia; Lumbar spondylosis; Grade 1 Anterolisthesis of L4 over L5; Chronic sacroiliac joint pain (bilateral); Chronic hip pain (Left); Arthropathy of left hip (Severe chronic left hip DJD); Osteoarthritis of hip (Left); Lumbar facet arthropathy;  Lumbar facet hypertrophy; Lumbar facet syndrome; Chronic flank pain (Right); Chronic lower extremity pain (Right); and Encounter for chronic pain management on her problem list.. Her primarily concern today is the Hip Pain (left)   Pain Assessment: Self-Reported Pain Score: 5              Reported level is compatible with observation       Pain Type: Chronic pain Pain Location: Hip Pain Orientation: Left Pain Descriptors / Indicators: Shooting, Stabbing Pain Frequency: Constant  The patient comes into the clinics today for pharmacological management of her chronic pain. I last saw this patient on 08/02/2015. The patient  reports that she does not use drugs. Her body mass index is 57.63 kg/m.  Date of Last Visit: 05/10/15 Service Provided on Last Visit: Med Refill  Controlled Substance Pharmacotherapy Assessment & REMS (Risk Evaluation and Mitigation Strategy)  Analgesic: Morphine ER 30 mg every 8 hours (90 mg/day) + hydrocodone/APAP 5/325 one every 6 hours (20 mg/day of hydrocodone) MME/day: 110 mg/day.  Pill Count: Morphine 30 mg - 45/90; filled 07/19/15. Hydrocodone 5/325 mg - 68/120; filled 07/19/15. Pharmacokinetics: Onset of action (Liberation/Absorption): Within expected pharmacological parameters Time to Peak effect (Distribution): Timing and results are as within normal expected parameters Duration of action (Metabolism/Excretion): Within normal limits for medication Pharmacodynamics: Analgesic Effect: More than 50% Activity Facilitation: Medication(s) allow patient to sit, stand, walk, and do the basic ADLs Perceived Effectiveness: Described as relatively effective, allowing for increase in activities of daily living (ADL) Side-effects or Adverse reactions: None reported Monitoring: [place] PMP: Online review of the past 75-month period conducted. Compliant with practice rules and regulations Last UDS on record: Tox[employer] Select 13  Date Value Ref Range Status  05/10/2015 FINAL   Final    Comment:    ==================================================================== TOXASSURE SELECT 13 (MW) ==================================================================== Test  Result       Flag       Units Drug Present and Declared for Prescription Verification   Hydrocodone                    1321         EXPECTED   ng/mg creat   Hydromorphone                  182          EXPECTED   ng/mg creat   Dihydrocodeine                 189          EXPECTED   ng/mg creat   Norhydrocodone                 1055         EXPECTED   ng/mg creat    Sources of hydrocodone include scheduled prescription    medications. Hydromorphone, dihydrocodeine and norhydrocodone are    expected metabolites of hydrocodone. Hydromorphone and    dihydrocodeine are also available as scheduled prescription    medications. Drug Present not Declared for Prescription Verification   Morphine                       14595        UNEXPECTED ng/mg creat    Potential sources of large amounts of morphine in the absence of    codeine include administration of morphine or use of heroin. ==================================================================== Test                      Result    Flag   Units      Ref Range   Creatinine              38               mg/dL      >=16 ==================================================================== Declared Medications:  The flagging and interpretation on this report are based on the  following declared medications.  Unexpected results may arise from  inaccuracies in the declared medications.  **Note: The testing scope of this panel includes these medications:  Hydrocodone (Norco)  **Note: The testing scope of this panel does not include following  reported medications:  Acetaminophen (Norco)  Albuterol (Proventil)  Aspirin (Aspirin 81)  Bupropion (Wellbutrin)  Cyanocobalamin  Enalapril (Vasotec)  Furosemide (Lasix)  Insulin (Novolin)   Vitamin D3 ==================================================================== For clinical consultation, please call [phone]. ====================================================================    UDS interpretation: Compliant. Absence of the parent compound in the presence of its metabolites could be due to lapse of time since the last dose or unusual pharmacokinetics (Rapid Metabolism). Medication Assessment Form: Reviewed. Patient indicates being compliant with therapy Treatment compliance: Compliant Risk Assessment: Aberrant Behavior: None observed today Substance Use Disorder (SUD) Risk Level: No change since last visit Risk of opioid abuse or dependence: 0.7-3.0% with doses ? 36 MME/day and 6.1-26% with doses ? 120 MME/day. Opioid Risk Tool (ORT) Score: Total Score: 1 Low Risk for SUD (Score <3) Depression Scale Score: PHQ-2: PHQ-2 Total Score: 0 No depression (0) PHQ-9: PHQ-9 Total Score: 0 No depression (0-4)  Pharmacologic Plan: No change in therapy, at this time  Laboratory Chemistry  Inflammation Markers Lab Results  Component Value Date   ESRSEDRATE 28 02/15/2015   CRP 2.0 (H) 02/15/2015    Renal Function Lab Results  Component Value Date   BUN 27 (H) 02/15/2015   CREATININE 1.42 (H) 02/15/2015   GFRAA 47 (L) 02/15/2015   GFRNONAA 41 (L) 02/15/2015    Hepatic Function Lab Results  Component Value Date   AST 16 02/15/2015   ALT 19 02/15/2015   ALBUMIN 4.3 02/15/2015    Electrolytes Lab Results  Component Value Date   NA 138 02/15/2015   K 4.8 02/15/2015   CL 102 02/15/2015   CALCIUM 9.4 02/15/2015   MG 2.0 02/15/2015    Pain Modulating Vitamins No results found for: VD25OH, VD125OH2TOT, ZO1096EA5, WU9811BJ4, 25OHVITD1, 25OHVITD2, 25OHVITD3, VITAMINB12  Coagulation Parameters No results found for: INR, LABPROT, APTT, PLT  Cardiovascular No results found for: BNP, HGB, HCT  Note: Lab results reviewed.  Recent Diagnostic Imaging  No  results found.  Meds  The patient has a current medication list which includes the following prescription(s): albuterol, b-d ins syr ultrafine 1cc/30g, bupropion, vitamin d3, enalapril, furosemide, glucose blood, hydrocodone-acetaminophen, hydrocodone-acetaminophen, hydrocodone-acetaminophen, insulin nph-regular human, insulin regular, insulin syringe .3cc/29gx1/2", accu-chek multiclix, mirtazapine, montelukast, morphine, morphine, morphine, oxybutynin, pregabalin, tizanidine, ulticare insulin syringe, and vitamin b-12.  Current Outpatient Prescriptions on File Prior to Visit  Medication Sig  . albuterol (PROAIR HFA) 108 (90 Base) MCG/ACT inhaler Inhale 1 puff into the lungs as needed.   . B-D INS SYR ULTRAFINE 1CC/30G 30G X 1/2" 1 ML MISC   . buPROPion (WELLBUTRIN SR) 150 MG 12 hr tablet Take 150 mg by mouth 2 (two) times daily.   . Cholecalciferol (VITAMIN D3) 5000 UNITS CAPS Take 5,000 Units by mouth daily.   . enalapril (VASOTEC) 20 MG tablet Take by mouth daily.   . furosemide (LASIX) 20 MG tablet Take 20 mg by mouth daily.   Marland Kitchen glucose blood test strip 1 each as needed.   . insulin NPH-regular Human (NOVOLIN 70/30) (70-30) 100 UNIT/ML injection Inject 36 units SQ 30 minutes before breakfast.  . insulin regular (NOVOLIN R,HUMULIN R) 100 units/mL injection SSI: 151-200: 2 units, 201-250: 4 units, 251-300:6 units, 301-350:8 units, 351-400: 10 units > 400 call MD  . Insulin Syringe-Needle U-100 (INSULIN SYRINGE .3CC/29GX1/2") 29G X 1/2" 0.3 ML MISC   . Lancets (ACCU-CHEK MULTICLIX) lancets 1 each.  . mirtazapine (REMERON) 15 MG tablet Take 15 mg by mouth at bedtime.   . montelukast (SINGULAIR) 10 MG tablet Take 10 mg by mouth daily.   Marland Kitchen oxybutynin (DITROPAN XL) 10 MG 24 hr tablet Take 10 mg by mouth daily.   Marland Kitchen tiZANidine (ZANAFLEX) 4 MG tablet Take 4 mg by mouth every 6 (six) hours.   Marland Kitchen ULTICARE INSULIN SYRINGE 30G X 1/2" 0.3 ML MISC   . vitamin B-12 (CYANOCOBALAMIN) 1000 MCG tablet 1,000  mcg. Take 1,000 mcg by mouth daily.   No current facility-administered medications on file prior to visit.     ROS  Constitutional: Denies any fever or chills Gastrointestinal: No reported hemesis, hematochezia, vomiting, or acute GI distress Musculoskeletal: Denies any acute onset joint swelling, redness, loss of ROM, or weakness Neurological: No reported episodes of acute onset apraxia, aphasia, dysarthria, agnosia, amnesia, paralysis, loss of coordination, or loss of consciousness  Allergies  Ms. Schlegel is allergic to augmentin [amoxicillin-pot clavulanate]; sulfa antibiotics; and mobic [meloxicam].  PFSH  Medical:  Ms. Shorkey  has a past medical history of Allergy; Arthritis; Asthma; Chronic kidney disease; Depression; Diabetes mellitus without complication (HCC); Hypertension; Multiple sclerosis (HCC); Neuromuscular disorder (HCC); and Sleep apnea. Family: family history includes Dementia in her mother;  Depression in her mother; Diabetes in her mother; Hyperlipidemia in her mother; Stroke in her father. Surgical:  has a past surgical history that includes Cesarean section (1991 and 1996). Tobacco:  reports that she has quit smoking. She does not have any smokeless tobacco history on file. Alcohol:  reports that she does not drink alcohol. Drug:  reports that she does not use drugs.  Constitutional Exam  Vitals: Blood pressure (!) 107/59, temperature 98.1 F (36.7 C), temperature source Oral, resp. rate 14, height 5\' 1"  (1.549 m), weight (!) 305 lb (138.3 kg), SpO2 98 %. General appearance: Well nourished, well developed, and well hydrated. In no acute distress Calculated BMI/Body habitus: Body mass index is 57.63 kg/m. (>40 kg/m2) Extreme obesity (Class III) - 254% higher incidence of chronic pain Psych/Mental status: Alert and oriented x 3 (person, place, & time) Eyes: PERLA Respiratory: No evidence of acute respiratory distress  Cervical Spine Exam  Inspection: No masses,  redness, or swelling Alignment: Symmetrical ROM: Functional: ROM is within functional limits [facility]) Stability: No instability detected Muscle strength & Tone: Functionally intact Sensory: Unimpaired Palpation: No complaints of tenderness  Upper Extremity (UE) Exam    Side: Right upper extremity  Side: Left upper extremity  Inspection: No masses, redness, swelling, or asymmetry  Inspection: No masses, redness, swelling, or asymmetry  ROM:  ROM:  Functional: Decreased ROM        Functional: ROM is within functional limits [facility])        Muscle strength & Tone: Functionally intact  Muscle strength & Tone: Functionally intact  Sensory: Movement-associated pain  Sensory: Unimpaired  Palpation: Area tender to palpation  Palpation: No complaints of tenderness   Thoracic Spine Exam  Inspection: No masses, redness, or swelling Alignment: Symmetrical ROM: Functional: ROM is within functional limits [facility]) Stability: No instability detected Sensory: Unimpaired Muscle strength & Tone: Functionally intact Palpation: No complaints of tenderness  Lumbar Spine Exam  Inspection: No masses, redness, or swelling Alignment: Symmetrical ROM: Functional: ROM is within functional limits [facility]) Stability: No instability detected Muscle strength & Tone: Functionally intact Sensory: Unimpaired Palpation: No complaints of tenderness Provocative Tests: Lumbar Hyperextension and rotation test: provocative test deferred today       Patrick's Maneuver: provocative test deferred today              Gait & Posture Assessment  Ambulation: Unassisted Gait: Unaffected Posture: WNL   Lower Extremity Exam    Side: Right lower extremity  Side: Left lower extremity  Inspection: No masses, redness, swelling, or asymmetry ROM:  Inspection: No masses, redness, swelling, or asymmetry ROM:  Functional: ROM is within functional limits [facility])        Functional: ROM is within functional limits [facility])        Muscle  strength & Tone: Functionally intact  Muscle strength & Tone: Functionally intact  Sensory: Unimpaired  Sensory: Unimpaired  Palpation: No complaints of tenderness  Palpation: No complaints of tenderness   Assessment & Plan  Primary Diagnosis & Pertinent Problem List: The primary encounter diagnosis was Chronic pain. Diagnoses of Long term current use of opiate analgesic, Opiate use (80 MME/Day), Encounter for chronic pain management, and Fibromyalgia were also pertinent to this visit.  Visit Diagnosis: 1. Chronic pain   2. Long term current use of opiate analgesic   3. Opiate use (80 MME/Day)   4. Encounter for chronic pain management   5. Fibromyalgia     Problems updated and reviewed during this visit: Problem  Encounter for Chronic Pain Management    Problem-specific Plan(s): No problem-specific Assessment & Plan notes found for this encounter.  No new Assessment & Plan notes have been filed under this hospital service since the last note was generated. Service: Pain Management   Plan of Care   Problem List Items Addressed This Visit      High   Chronic pain - Primary (Chronic)   Relevant Medications   HYDROcodone-acetaminophen (NORCO/VICODIN) 5-325 MG tablet   HYDROcodone-acetaminophen (NORCO/VICODIN) 5-325 MG tablet   HYDROcodone-acetaminophen (NORCO/VICODIN) 5-325 MG tablet   morphine (MS CONTIN) 30 MG 12 hr tablet   morphine (MS CONTIN) 30 MG 12 hr tablet   morphine (MS CONTIN) 30 MG 12 hr tablet   pregabalin (LYRICA) 150 MG capsule   Fibromyalgia (Chronic)   Relevant Medications   pregabalin (LYRICA) 150 MG capsule     Medium   Encounter for chronic pain management   Long term current use of opiate analgesic (Chronic)   Opiate use (80 MME/Day) (Chronic)    Other Visit Diagnoses   None.      Pharmacotherapy (Medications Ordered): Meds ordered this encounter  Medications  . HYDROcodone-acetaminophen (NORCO/VICODIN) 5-325 MG tablet    Sig: Take 1  tablet by mouth every 6 (six) hours as needed for severe pain.    Dispense:  120 tablet    Refill:  0    Do not place this medication, or any other prescription from our practice, on "Automatic Refill". Patient may have prescription filled one day early if pharmacy is closed on scheduled refill date. Do not fill until: 08/17/15 To last until: 09/16/15  . HYDROcodone-acetaminophen (NORCO/VICODIN) 5-325 MG tablet    Sig: Take 1 tablet by mouth every 6 (six) hours as needed for severe pain.    Dispense:  120 tablet    Refill:  0    Do not place this medication, or any other prescription from our practice, on "Automatic Refill". Patient may have prescription filled one day early if pharmacy is closed on scheduled refill date. Do not fill until: 09/16/15 To last until: 10/16/15  . HYDROcodone-acetaminophen (NORCO/VICODIN) 5-325 MG tablet    Sig: Take 1 tablet by mouth every 6 (six) hours as needed for severe pain.    Dispense:  120 tablet    Refill:  0    Do not place this medication, or any other prescription from our practice, on "Automatic Refill". Patient may have prescription filled one day early if pharmacy is closed on scheduled refill date. Do not fill until: 10/16/15 To last until: 11/15/15  . morphine (MS CONTIN) 30 MG 12 hr tablet    Sig: Take 1 tablet (30 mg total) by mouth every 8 (eight) hours as needed for pain.    Dispense:  90 tablet    Refill:  0    Do not place this medication, or any other prescription from our practice, on "Automatic Refill". Patient may have prescription filled one day early if pharmacy is closed on scheduled refill date. Do not fill until: 08/17/15 To last until: 09/16/15  . morphine (MS CONTIN) 30 MG 12 hr tablet    Sig: Take 1 tablet (30 mg total) by mouth every 8 (eight) hours as needed for pain.    Dispense:  90 tablet    Refill:  0    Do not place this medication, or any other prescription from our practice, on "Automatic Refill". Patient may  have prescription filled one day early if pharmacy  is closed on scheduled refill date. Do not fill until: 09/16/15 To last until: 10/16/15  . morphine (MS CONTIN) 30 MG 12 hr tablet    Sig: Take 1 tablet (30 mg total) by mouth every 8 (eight) hours as needed for pain.    Dispense:  90 tablet    Refill:  0    Do not place this medication, or any other prescription from our practice, on "Automatic Refill". Patient may have prescription filled one day early if pharmacy is closed on scheduled refill date. Do not fill until: 10/16/15 To last until: 11/15/15  . pregabalin (LYRICA) 150 MG capsule    Sig: Take 1 capsule (150 mg total) by mouth every 8 (eight) hours.    Dispense:  90 capsule    Refill:  2    Do not place this medication, or any other prescription from our practice, on "Automatic Refill". Patient may have prescription filled one day early if pharmacy is closed on scheduled refill date.    Lab-work & Procedure Ordered: No orders of the defined types were placed in this encounter.   Imaging Ordered: None  Interventional Therapies: Scheduled:  None at this time.    Considering:  None at this time.    PRN Procedures:  None at this time.    Referral(s) or Consult(s): None at this time.  New Prescriptions   No medications on file    Medications administered during this visit: Ms. Reveles had no medications administered during this visit.  Requested PM Follow-up: Return in 3 months (on 11/01/2015) for Med-Mgmt, (3-Mo).  No future appointments.  Primary Care Physician: Rolm Gala, MD Location: [facility] Note by: Sydnee Levans. Laban Emperor, M.D, DABA, DABAPM, DABPM, DABIPP, FIPP  Pain Score Disclaimer: We use the NRS-11 scale. This is a self-reported, subjective measurement of pain severity with only modest accuracy. It is used primarily to identify changes within a particular patient. It must be understood that outpatient pain scales are  significantly less accurate that those used for research, where they can be applied under ideal controlled circumstances with minimal exposure to variables. In reality, the score is likely to be a combination of pain intensity and pain affect, where pain affect describes the degree of emotional arousal or changes in action readiness caused by the sensory experience of pain. Factors such as social and work situation, setting, emotional state, anxiety levels, expectation, and prior pain experience may influence pain perception and show large inter-individual differences that may also be affected by time variables.  Patient instructions provided during this appointment: There are no Patient Instructions on file for this visit.

## 2015-08-09 DIAGNOSIS — I83009 Varicose veins of unspecified lower extremity with ulcer of unspecified site: Secondary | ICD-10-CM | POA: Insufficient documentation

## 2015-08-09 DIAGNOSIS — L97909 Non-pressure chronic ulcer of unspecified part of unspecified lower leg with unspecified severity: Secondary | ICD-10-CM

## 2015-11-02 ENCOUNTER — Encounter: Payer: BLUE CROSS/BLUE SHIELD | Admitting: Pain Medicine

## 2015-11-04 ENCOUNTER — Encounter: Payer: Self-pay | Admitting: Pain Medicine

## 2015-11-04 ENCOUNTER — Ambulatory Visit: Payer: BLUE CROSS/BLUE SHIELD | Attending: Pain Medicine | Admitting: Pain Medicine

## 2015-11-04 DIAGNOSIS — M797 Fibromyalgia: Secondary | ICD-10-CM

## 2015-11-04 DIAGNOSIS — Z79899 Other long term (current) drug therapy: Secondary | ICD-10-CM | POA: Diagnosis not present

## 2015-11-04 DIAGNOSIS — M25512 Pain in left shoulder: Secondary | ICD-10-CM | POA: Diagnosis present

## 2015-11-04 DIAGNOSIS — M25552 Pain in left hip: Secondary | ICD-10-CM | POA: Diagnosis present

## 2015-11-04 DIAGNOSIS — G894 Chronic pain syndrome: Secondary | ICD-10-CM

## 2015-11-04 DIAGNOSIS — M542 Cervicalgia: Secondary | ICD-10-CM | POA: Diagnosis not present

## 2015-11-04 MED ORDER — PREGABALIN 150 MG PO CAPS: 150.0000 mg | ORAL_CAPSULE | Freq: Three times a day (TID) | ORAL | 2 refills | Status: DC

## 2015-11-04 MED ORDER — MORPHINE SULFATE ER 30 MG PO TBCR: 30.0000 mg | EXTENDED_RELEASE_TABLET | Freq: Three times a day (TID) | ORAL | 0 refills | Status: DC | PRN

## 2015-11-04 MED ORDER — HYDROCODONE-ACETAMINOPHEN 5-325 MG PO TABS: 1.0000 | ORAL_TABLET | Freq: Four times a day (QID) | ORAL | 0 refills | Status: DC | PRN

## 2015-11-04 NOTE — Progress Notes (Signed)
Patient's Name: Tiffany Herring  MRN: [id number]  Referring Provider: Hortencia Pilar, MD  DOB: [birth year]  PCP: Hortencia Pilar, MD  DOS: 11/04/2015  Note by: Kathlen Brunswick. Dossie Arbour, MD  Service setting: Ambulatory outpatient  Specialty: Interventional Pain Management  Location: [facility] (AMB) Pain Management Facility    Patient type: Established   Primary Reason(s) for Visit: Encounter for prescription drug management (Level of risk: moderate) CC: Hip Pain (left); Shoulder Pain (both); and Neck Pain (across the back of neck both sides)  HPI  [name] is a 55 y.o. year old, female patient, who comes today for an initial evaluation. She has Chronic pain; Failed back surgical syndrome; Epidural fibrosis; Chronic low back pain; Chronic lumbar radicular pain (Right); Long term current use of opiate analgesic; Long term prescription opiate use; Opiate use (80 MME/Day); Opiate dependence ([place]); Encounter for therapeutic drug level monitoring; Allergic rhinitis; Carotid artery bruit; Chronic kidney disease (CKD), stage III (moderate); Colon polyp; Diabetes mellitus ([place]); Dysthymia; Essential (primary) hypertension; Generalized OA; Acid reflux; History of intermenstrual bleeding; History of migraine headaches; H/O nutritional disorder; Hypercholesterolemia; Type 2 diabetes mellitus treated with insulin ([place]); Other long term (current) drug therapy; Menopausal and perimenopausal disorder; Class III Morbid obesity ([place]) (254% higher incidence of chronic low back pain); Multiple sclerosis ([place]); Obstructive apnea; Degenerative arthritis of hip; Awareness of heartbeats; History of colon polyps; Post menopausal syndrome; Pure hypercholesterolemia; Enlargement of spleen; B12 deficiency; Fibromyalgia; Lumbar spondylosis; Grade 1 Anterolisthesis of L4 over L5; Chronic sacroiliac joint pain (bilateral); Chronic hip pain (Left); Arthropathy of left hip (Severe chronic left hip DJD); Osteoarthritis of hip (Left); Lumbar facet  arthropathy; Lumbar facet hypertrophy; Lumbar facet syndrome; Chronic flank pain (Right); Chronic lower extremity pain (Right); and Encounter for chronic pain management on her problem list.. Her primarily concern today is the Hip Pain (left); Shoulder Pain (both); and Neck Pain (across the back of neck both sides)  Pain Assessment: Self-Reported Pain Score: 4 /10             Reported level is compatible with observation.       Pain Type: Chronic pain Pain Location: Hip Pain Orientation: Left Pain Descriptors / Indicators: Sharp, Stabbing (aching pain in the shoulders) Pain Frequency: Constant  [name] was last seen on 08/05/2015 for medication management. During today's appointment we reviewed Ms. Resch's chronic pain status, as well as her outpatient medication regimen.  The patient  reports that she does not use drugs. Her body mass index is 54.87 kg/m.  Further details on both, my assessment(s), as well as the proposed treatment plan, please see below.  Controlled Substance Pharmacotherapy Assessment REMS (Risk Evaluation and Mitigation Strategy)  Analgesic: Morphine ER 30 mg every 8 hours (90 mg/day) + hydrocodone/APAP 5/325 one every 6 hours (20 mg/day of hydrocodone) MME/day: 110 mg/day.   Evon Slack, RN  11/04/2015 10:56 AM  Sign at close encounter Nursing Pain Medication Assessment:  Safety precautions to be maintained throughout the outpatient stay will include: orient to surroundings, keep bed in low position, maintain call bell within reach at all times, provide assistance with transfer out of bed and ambulation.  Medication Inspection Compliance: Pill count conducted under aseptic conditions, in front of the patient. Neither the pills nor the bottle was removed from the patient's sight at any time. Once count was completed pills were immediately returned to the patient in their original bottle. Pill Count: 50 of 120 pills remain Bottle Appearance: Standard pharmacy  container. Clearly labeled. Medication: Hydrocodone/APAP  Filled Date: 10 / 07 / 2017   Pill Count: 38 of 90 pills remain Bottle Appearance: Standard pharmacy container. Clearly labeled. Medication: Morphine ER (MSContin) Filled Date: 7 / 07 / 2017 Medication last intake:11/04/2015     Pharmacokinetics: Liberation and absorption (onset of action): WNL Distribution (time to peak effect): WNL Metabolism and excretion (duration of action): WNL         Pharmacodynamics: Desired effects: Analgesia: The patient reports >50% benefit. Reported improvement in function: The patient reports medication allows her to accomplish basic ADLs. Clinically meaningful improvement in function (CMIF): Sustained CMIF goals met Perceived effectiveness: Described as relatively effective, allowing for increase in activities of daily living (ADL) Undesirable effects: Side-effects or Adverse reactions: None reported Monitoring: [place] PMP: Online review of the past 16-monthperiod conducted. Compliant with practice rules and regulations List of all UDS test(s) done:  Lab Results  Component Value Date   TOXASSSELUR FINAL 05/10/2015   TOXASSSELUR FINAL 02/15/2015   TOXASSSELUR FINAL 11/16/2014   Last UDS on record: ToxAssure Select 13  Date Value Ref Range Status  05/10/2015 FINAL  Final    Comment:    ==================================================================== TOXASSURE SELECT 13 (MW) ==================================================================== Test                             Result       Flag       Units Drug Present and Declared for Prescription Verification   Hydrocodone                    1321         EXPECTED   ng/mg creat   Hydromorphone                  182          EXPECTED   ng/mg creat   Dihydrocodeine                 189          EXPECTED   ng/mg creat   Norhydrocodone                 1055         EXPECTED   ng/mg creat    Sources of hydrocodone include scheduled  prescription    medications. Hydromorphone, dihydrocodeine and norhydrocodone are    expected metabolites of hydrocodone. Hydromorphone and    dihydrocodeine are also available as scheduled prescription    medications. Drug Present not Declared for Prescription Verification   Morphine                       14595        UNEXPECTED ng/mg creat    Potential sources of large amounts of morphine in the absence of    codeine include administration of morphine or use of heroin. ==================================================================== Test                      Result    Flag   Units      Ref Range   Creatinine              38               mg/dL      >=20 ==================================================================== Declared Medications:  The flagging and interpretation on this report are based on the  following declared medications.  Unexpected results may arise from  inaccuracies in the declared medications.  **Note: The testing scope of this panel includes these medications:  Hydrocodone (Norco)  **Note: The testing scope of this panel does not include following  reported medications:  Acetaminophen (Norco)  Albuterol (Proventil)  Aspirin (Aspirin 81)  Bupropion (Wellbutrin)  Cyanocobalamin  Enalapril (Vasotec)  Furosemide (Lasix)  Insulin (Novolin)  Vitamin D3 ==================================================================== For clinical consultation, please call [phone]. ====================================================================    UDS interpretation: Compliant          Medication Assessment Form: Reviewed. Patient indicates being compliant with therapy Treatment compliance: Compliant Risk Assessment Profile: Aberrant behavior: See prior evaluations. None observed or detected today Comorbid factors increasing risk of overdose: See prior notes. No additional risks detected today Risk of substance use disorder (SUD): Low Opioid Risk Tool  (ORT) Total Score: 1  Interpretation Table:  Score <3 = Low Risk for SUD  Score between 4-7 = Moderate Risk for SUD  Score >8 = High Risk for Opioid Abuse   Risk Mitigation Strategies:  Patient Counseling: Covered Patient-Prescriber Agreement (PPA): Present and active  Notification to other healthcare providers: Done  Pharmacologic Plan: No change in therapy, at this time  Laboratory Chemistry  Inflammation Markers Lab Results  Component Value Date   ESRSEDRATE 28 02/15/2015   CRP 2.0 (H) 02/15/2015   Renal Function Lab Results  Component Value Date   BUN 27 (H) 02/15/2015   CREATININE 1.42 (H) 02/15/2015   GFRAA 47 (L) 02/15/2015   GFRNONAA 41 (L) 02/15/2015   Hepatic Function Lab Results  Component Value Date   AST 16 02/15/2015   ALT 19 02/15/2015   ALBUMIN 4.3 02/15/2015   Electrolytes Lab Results  Component Value Date   NA 138 02/15/2015   K 4.8 02/15/2015   CL 102 02/15/2015   CALCIUM 9.4 02/15/2015   MG 2.0 02/15/2015   Pain Modulating Vitamins No results found for: [place], VD125OH2TOT, IR6789FY1, OF7510CH8, 25OHVITD1, 25OHVITD2, 25OHVITD3, VITAMINB12 Coagulation Parameters No results found for: INR, LABPROT, APTT, PLT Cardiovascular No results found for: BNP, HGB, HCT Note: Lab results reviewed.  Recent Diagnostic Imaging Review  No results found. Note: Imaging results reviewed.  Meds  The patient has a current medication list which includes the following prescription(s): albuterol, b-d ins syr ultrafine 1cc/30g, bupropion, vitamin d3, enalapril, furosemide, blood glucose test strips, glucose blood, hydrocodone-acetaminophen, hydrocodone-acetaminophen, hydrocodone-acetaminophen, insulin nph-regular human, insulin regular, insulin syringe .3cc/29gx1/2", accu-chek multiclix, mirtazapine, montelukast, morphine, morphine, morphine, morphine, oxybutynin, pregabalin, tizanidine, triamcinolone cream, ulticare insulin syringe, and vitamin b-12.  Current  Outpatient Prescriptions on File Prior to Visit  Medication Sig  . albuterol (PROAIR HFA) 108 (90 Base) MCG/ACT inhaler Inhale 1 puff into the lungs as needed.   . B-D INS SYR ULTRAFINE 1CC/30G 30G X 1/2" 1 ML MISC   . buPROPion (WELLBUTRIN SR) 150 MG 12 hr tablet Take 150 mg by mouth 2 (two) times daily.   . enalapril (VASOTEC) 20 MG tablet Take by mouth daily.   . furosemide (LASIX) 20 MG tablet Take 20 mg by mouth daily.   Marland Kitchen glucose blood test strip 1 each as needed.   . insulin NPH-regular Human (NOVOLIN 70/30) (70-30) 100 UNIT/ML injection Inject 36 units SQ 30 minutes before breakfast.  . insulin regular (NOVOLIN R,HUMULIN R) 100 units/mL injection SSI: 151-200: 2 units, 201-250: 4 units, 251-300:6 units, 301-350:8 units, 351-400: 10 units > 400 call MD  . Insulin Syringe-Needle U-100 (INSULIN SYRINGE .3CC/29GX1/2") 29G X 1/2" 0.3 ML  MISC   . Lancets (ACCU-CHEK MULTICLIX) lancets 1 each.  . mirtazapine (REMERON) 15 MG tablet Take 15 mg by mouth at bedtime.   . montelukast (SINGULAIR) 10 MG tablet Take 10 mg by mouth daily.   Marland Kitchen oxybutynin (DITROPAN XL) 10 MG 24 hr tablet Take 10 mg by mouth daily.   Marland Kitchen tiZANidine (ZANAFLEX) 4 MG tablet Take 4 mg by mouth every 6 (six) hours.   Marland Kitchen ULTICARE INSULIN SYRINGE 30G X 1/2" 0.3 ML MISC   . vitamin B-12 (CYANOCOBALAMIN) 1000 MCG tablet 1,000 mcg. Take 1,000 mcg by mouth daily.   No current facility-administered medications on file prior to visit.    ROS  Constitutional: Denies any fever or chills Gastrointestinal: No reported hemesis, hematochezia, vomiting, or acute GI distress Musculoskeletal: Denies any acute onset joint swelling, redness, loss of ROM, or weakness Neurological: No reported episodes of acute onset apraxia, aphasia, dysarthria, agnosia, amnesia, paralysis, loss of coordination, or loss of consciousness  Allergies  Ms. Melcher is allergic to augmentin [amoxicillin-pot clavulanate]; sulfa antibiotics; and mobic  [meloxicam].  [place]  Drug: Ms. Pidgeon  reports that she does not use drugs. Alcohol:  reports that she does not drink alcohol. Tobacco:  reports that she has quit smoking. She does not have any smokeless tobacco history on file. Medical:  has a past medical history of Allergy; Arthritis; Asthma; Chronic kidney disease; Depression; Diabetes mellitus without complication ([place]); Hypertension; Multiple sclerosis ([place]); Neuromuscular disorder ([place]); and Sleep apnea. Family: family history includes Dementia in her mother; Depression in her mother; Diabetes in her mother; Hyperlipidemia in her mother; Stroke in her father.  Past Surgical History:  Procedure Laterality Date  . CESAREAN SECTION  1991 and 1996   x 2   Constitutional Exam  General appearance: Well nourished, well developed, and well hydrated. In no apparent acute distress Vitals:   11/04/15 1021  BP: 100/62  Pulse: 76  Temp: 98.6 F (37 C)  TempSrc: Oral  Weight: 300 lb (136.1 kg)  Height: _0  (1.575 m)   BMI Assessment: Estimated body mass index is 54.87 kg/m as calculated from the following:   Height as of this encounter: _1  (1.575 m).   Weight as of this encounter: 300 lb (136.1 kg).  BMI interpretation table: BMI level Category Range association with higher incidence of chronic pain  <18 kg/m2 Underweight   18.5-24.9 kg/m2 Ideal body weight   25-29.9 kg/m2 Overweight Increased incidence by 20%  30-34.9 kg/m2 Obese (Class I) Increased incidence by 68%  35-39.9 kg/m2 Severe obesity (Class II) Increased incidence by 136%  >40 kg/m2 Extreme obesity (Class III) Increased incidence by 254%   BMI Readings from Last 4 Encounters:  11/04/15 54.87 kg/m  08/05/15 57.63 kg/m  05/10/15 55.24 kg/m  02/15/15 55.05 kg/m   Wt Readings from Last 4 Encounters:  11/04/15 300 lb (136.1 kg)  08/05/15 (!) 305 lb (138.3 kg)  05/10/15 (!) 302 lb (137 kg)  02/15/15 (!) 301 lb (136.5 kg)  Psych/Mental status: Alert,  oriented x 3 (person, place, & time) Eyes: PERLA Respiratory: No evidence of acute respiratory distress  Cervical Spine Exam  Inspection: No masses, redness, or swelling Alignment: Symmetrical Functional ROM: Unrestricted ROM Stability: No instability detected Muscle strength & Tone: Functionally intact Sensory: Unimpaired Palpation: Non-contributory  Upper Extremity (UE) Exam    Side: Right upper extremity  Side: Left upper extremity  Inspection: No masses, redness, swelling, or asymmetry  Inspection: No masses, redness, swelling, or asymmetry  Functional  ROM: Unrestricted ROM         Functional ROM: Unrestricted ROM          Muscle strength & Tone: Functionally intact  Muscle strength & Tone: Functionally intact  Sensory: Unimpaired  Sensory: Unimpaired  Palpation: Non-contributory  Palpation: Non-contributory   Thoracic Spine Exam  Inspection: No masses, redness, or swelling Alignment: Symmetrical Functional ROM: Unrestricted ROM Stability: No instability detected Sensory: Unimpaired Muscle strength & Tone: Functionally intact Palpation: Non-contributory  Lumbar Spine Exam  Inspection: No masses, redness, or swelling Alignment: Symmetrical Functional ROM: Unrestricted ROM Stability: No instability detected Muscle strength & Tone: Functionally intact Sensory: Unimpaired Palpation: Non-contributory Provocative Tests: Lumbar Hyperextension and rotation test: evaluation deferred today       Patrick's Maneuver: evaluation deferred today              Gait & Posture Assessment  Ambulation: Unassisted Gait: Relatively normal for age and body habitus Posture: WNL   Lower Extremity Exam    Side: Right lower extremity  Side: Left lower extremity  Inspection: No masses, redness, swelling, or asymmetry  Inspection: No masses, redness, swelling, or asymmetry  Functional ROM: Unrestricted ROM          Functional ROM: Unrestricted ROM          Muscle strength & Tone:  Functionally intact  Muscle strength & Tone: Functionally intact  Sensory: Unimpaired  Sensory: Unimpaired  Palpation: Non-contributory  Palpation: Non-contributory   Assessment  Primary Diagnosis & Pertinent Problem List: Diagnoses of Chronic pain syndrome and Fibromyalgia were pertinent to this visit.  Visit Diagnosis: 1. Chronic pain syndrome   2. Fibromyalgia    Plan of Care  Pharmacotherapy (Medications Ordered): Meds ordered this encounter  Medications  . morphine (MS CONTIN) 30 MG 12 hr tablet    Sig: Take 1 tablet (30 mg total) by mouth every 8 (eight) hours as needed for pain.    Dispense:  90 tablet    Refill:  0    Do not place this medication, or any other prescription from our practice, on "Automatic Refill". Patient may have prescription filled one day early if pharmacy is closed on scheduled refill date. Do not fill until: 11/15/15 To last until: 12/15/15  . morphine (MS CONTIN) 30 MG 12 hr tablet    Sig: Take 1 tablet (30 mg total) by mouth every 8 (eight) hours as needed for pain.    Dispense:  90 tablet    Refill:  0    Do not place this medication, or any other prescription from our practice, on "Automatic Refill". Patient may have prescription filled one day early if pharmacy is closed on scheduled refill date. Do not fill until: 12/15/15 To last until: 01/14/16  . morphine (MS CONTIN) 30 MG 12 hr tablet    Sig: Take 1 tablet (30 mg total) by mouth every 8 (eight) hours as needed for pain.    Dispense:  90 tablet    Refill:  0    Do not place this medication, or any other prescription from our practice, on "Automatic Refill". Patient may have prescription filled one day early if pharmacy is closed on scheduled refill date. Do not fill until: 01/14/16 To last until: 02/13/16  . HYDROcodone-acetaminophen (NORCO/VICODIN) 5-325 MG tablet    Sig: Take 1 tablet by mouth every 6 (six) hours as needed for severe pain.    Dispense:  120 tablet    Refill:  0    Do  not  place this medication, or any other prescription from our practice, on "Automatic Refill". Patient may have prescription filled one day early if pharmacy is closed on scheduled refill date. Do not fill until: 11/15/15 To last until: 12/15/15  . HYDROcodone-acetaminophen (NORCO/VICODIN) 5-325 MG tablet    Sig: Take 1 tablet by mouth every 6 (six) hours as needed for severe pain.    Dispense:  120 tablet    Refill:  0    Do not place this medication, or any other prescription from our practice, on "Automatic Refill". Patient may have prescription filled one day early if pharmacy is closed on scheduled refill date. Do not fill until: 12/15/15 To last until: 01/14/16  . HYDROcodone-acetaminophen (NORCO/VICODIN) 5-325 MG tablet    Sig: Take 1 tablet by mouth every 6 (six) hours as needed for severe pain.    Dispense:  120 tablet    Refill:  0    Do not place this medication, or any other prescription from our practice, on "Automatic Refill". Patient may have prescription filled one day early if pharmacy is closed on scheduled refill date. Do not fill until: 01/14/16 To last until: 02/13/16  . pregabalin (LYRICA) 150 MG capsule    Sig: Take 1 capsule (150 mg total) by mouth every 8 (eight) hours.    Dispense:  90 capsule    Refill:  2    Do not place this medication, or any other prescription from our practice, on "Automatic Refill". Patient may have prescription filled one day early if pharmacy is closed on scheduled refill date.   New Prescriptions   No medications on file   Medications administered during this visit: Ms. Brosious had no medications administered during this visit. Lab-work, Procedure(s), & Referral(s) Ordered: No orders of the defined types were placed in this encounter.  Imaging & Referral(s) Ordered: None  Interventional Therapies: Pending/Scheduled/Planned:   None at this time.    Considering:   None at this time.    PRN Procedures:   None at this time.     Requested PM Follow-up: Return in 3 months (on 02/03/2016) for Med-Mgmt.  Future Appointments Date Time Provider [place]  02/03/2016 1:15 PM Milinda Pointer, MD [facility] None   Primary Care Physician: Hortencia Pilar, MD Location: [facility] Outpatient Pain Management Facility Note by: Kathlen Brunswick. Dossie Arbour, M.D, DABA, DABAPM, DABPM, DABIPP, FIPP  Pain Score Disclaimer: We use the NRS-11 scale. This is a self-reported, subjective measurement of pain severity with only modest accuracy. It is used primarily to identify changes within a particular patient. It must be understood that outpatient pain scales are significantly less accurate that those used for research, where they can be applied under ideal controlled circumstances with minimal exposure to variables. In reality, the score is likely to be a combination of pain intensity and pain affect, where pain affect describes the degree of emotional arousal or changes in action readiness caused by the sensory experience of pain. Factors such as social and work situation, setting, emotional state, anxiety levels, expectation, and prior pain experience may influence pain perception and show large inter-individual differences that may also be affected by time variables.  Patient instructions provided during this appointment: There are no Patient Instructions on file for this visit.

## 2015-11-04 NOTE — Progress Notes (Signed)
Nursing Pain Medication Assessment:  Safety precautions to be maintained throughout the outpatient stay will include: orient to surroundings, keep bed in low position, maintain call bell within reach at all times, provide assistance with transfer out of bed and ambulation.  Medication Inspection Compliance: Pill count conducted under aseptic conditions, in front of the patient. Neither the pills nor the bottle was removed from the patient's sight at any time. Once count was completed pills were immediately returned to the patient in their original bottle. Pill Count: 50 of 120 pills remain Bottle Appearance: Standard pharmacy container. Clearly labeled. Medication: Hydrocodone/APAP Filled Date: 10 / 07 / 2017   Pill Count: 38 of 90 pills remain Bottle Appearance: Standard pharmacy container. Clearly labeled. Medication: Morphine ER (MSContin) Filled Date: 10 / 07 / 2017 Medication last intake:11/04/2015

## 2016-01-14 DIAGNOSIS — R69 Illness, unspecified: Secondary | ICD-10-CM | POA: Diagnosis not present

## 2016-02-03 ENCOUNTER — Ambulatory Visit: Payer: Medicare HMO | Attending: Pain Medicine | Admitting: Pain Medicine

## 2016-02-03 ENCOUNTER — Encounter: Payer: Self-pay | Admitting: Pain Medicine

## 2016-02-03 VITALS — BP 116/60 | HR 88 | Temp 98.6°F | Resp 16 | Ht 62.0 in | Wt 307.0 lb

## 2016-02-03 DIAGNOSIS — Z79891 Long term (current) use of opiate analgesic: Secondary | ICD-10-CM

## 2016-02-03 DIAGNOSIS — R002 Palpitations: Secondary | ICD-10-CM | POA: Insufficient documentation

## 2016-02-03 DIAGNOSIS — G35 Multiple sclerosis: Secondary | ICD-10-CM | POA: Insufficient documentation

## 2016-02-03 DIAGNOSIS — Z5181 Encounter for therapeutic drug level monitoring: Secondary | ICD-10-CM | POA: Insufficient documentation

## 2016-02-03 DIAGNOSIS — Z79899 Other long term (current) drug therapy: Secondary | ICD-10-CM | POA: Insufficient documentation

## 2016-02-03 DIAGNOSIS — M4316 Spondylolisthesis, lumbar region: Secondary | ICD-10-CM | POA: Insufficient documentation

## 2016-02-03 DIAGNOSIS — G894 Chronic pain syndrome: Secondary | ICD-10-CM | POA: Diagnosis not present

## 2016-02-03 DIAGNOSIS — F341 Dysthymic disorder: Secondary | ICD-10-CM | POA: Insufficient documentation

## 2016-02-03 DIAGNOSIS — G8929 Other chronic pain: Secondary | ICD-10-CM

## 2016-02-03 DIAGNOSIS — Z87891 Personal history of nicotine dependence: Secondary | ICD-10-CM | POA: Insufficient documentation

## 2016-02-03 DIAGNOSIS — M79604 Pain in right leg: Secondary | ICD-10-CM

## 2016-02-03 DIAGNOSIS — Z86010 Personal history of colonic polyps: Secondary | ICD-10-CM | POA: Insufficient documentation

## 2016-02-03 DIAGNOSIS — R109 Unspecified abdominal pain: Secondary | ICD-10-CM | POA: Insufficient documentation

## 2016-02-03 DIAGNOSIS — M1612 Unilateral primary osteoarthritis, left hip: Secondary | ICD-10-CM | POA: Insufficient documentation

## 2016-02-03 DIAGNOSIS — R69 Illness, unspecified: Secondary | ICD-10-CM | POA: Diagnosis not present

## 2016-02-03 DIAGNOSIS — M5416 Radiculopathy, lumbar region: Secondary | ICD-10-CM | POA: Diagnosis not present

## 2016-02-03 DIAGNOSIS — I129 Hypertensive chronic kidney disease with stage 1 through stage 4 chronic kidney disease, or unspecified chronic kidney disease: Secondary | ICD-10-CM | POA: Insufficient documentation

## 2016-02-03 DIAGNOSIS — M5441 Lumbago with sciatica, right side: Secondary | ICD-10-CM | POA: Diagnosis not present

## 2016-02-03 DIAGNOSIS — N183 Chronic kidney disease, stage 3 (moderate): Secondary | ICD-10-CM | POA: Diagnosis not present

## 2016-02-03 DIAGNOSIS — Z794 Long term (current) use of insulin: Secondary | ICD-10-CM | POA: Diagnosis not present

## 2016-02-03 DIAGNOSIS — M797 Fibromyalgia: Secondary | ICD-10-CM | POA: Diagnosis not present

## 2016-02-03 DIAGNOSIS — M961 Postlaminectomy syndrome, not elsewhere classified: Secondary | ICD-10-CM

## 2016-02-03 DIAGNOSIS — M4310 Spondylolisthesis, site unspecified: Secondary | ICD-10-CM

## 2016-02-03 DIAGNOSIS — Z880 Allergy status to penicillin: Secondary | ICD-10-CM | POA: Diagnosis not present

## 2016-02-03 DIAGNOSIS — F1190 Opioid use, unspecified, uncomplicated: Secondary | ICD-10-CM

## 2016-02-03 MED ORDER — MORPHINE SULFATE ER 30 MG PO TBCR: 30.0000 mg | EXTENDED_RELEASE_TABLET | Freq: Three times a day (TID) | ORAL | 0 refills | Status: DC | PRN

## 2016-02-03 MED ORDER — HYDROCODONE-ACETAMINOPHEN 5-325 MG PO TABS: 1.0000 | ORAL_TABLET | Freq: Four times a day (QID) | ORAL | 0 refills | Status: DC | PRN

## 2016-02-03 NOTE — Progress Notes (Signed)
Nursing Pain Medication Assessment:  Safety precautions to be maintained throughout the outpatient stay will include: orient to surroundings, keep bed in low position, maintain call bell within reach at all times, provide assistance with transfer out of bed and ambulation.  Medication Inspection Compliance: Pill count conducted under aseptic conditions, in front of the patient. Neither the pills nor the bottle was removed from the patient's sight at any time. Once count was completed pills were immediately returned to the patient in their original bottle.  Medication #1: Hydrocodone/APAP Pill Count: 48 of 120 pills remain Bottle Appearance: Standard pharmacy container. Clearly labeled. Filled Date: 01 / 05 / 2018 Medication last intake: 02/03/2016  Medication #2: Morphine ER (MSContin) Pill Count: 36 of 90 pills remain Bottle Appearance: Standard pharmacy container. Clearly labeled. Filled Date: 01 / 05 / 2018

## 2016-02-03 NOTE — Patient Instructions (Signed)
You were given 3 prescriptions each for Hydrocodone and MS Contin

## 2016-02-03 NOTE — Progress Notes (Signed)
Patient's Name: Tiffany Herring  MRN: [id number]  Referring Provider: Hortencia Pilar, MD  DOB: 1960/07/22  PCP: Hortencia Pilar, MD  DOS: 02/03/2016  Note by: Kathlen Brunswick. Dossie Arbour, MD  Service setting: Ambulatory outpatient  Specialty: Interventional Pain Management  Location: [facility] (AMB) Pain Management Facility    Patient type: Established   Primary Reason(s) for Visit: Encounter for prescription drug management (Level of risk: moderate) CC: Hip Pain (left)  HPI  Tiffany Herring is a 56 y.o. year old, female patient, who comes today for a medication management evaluation. She has Failed back surgical syndrome; Epidural fibrosis; Chronic bilateral low back pain with right-sided sciatica; Chronic lumbar radicular pain (Right); Long term current use of opiate analgesic; Long term prescription opiate use; Opiate use (80 MME/Day); Opiate dependence ([place]); Encounter for therapeutic drug level monitoring; Allergic rhinitis; Carotid artery bruit; Chronic kidney disease (CKD), stage III (moderate); Colon polyp; Diabetes mellitus ([place]); Dysthymia; Essential (primary) hypertension; Generalized OA; Acid reflux; History of intermenstrual bleeding; History of migraine headaches; H/O nutritional disorder; Hypercholesterolemia; Type 2 diabetes mellitus treated with insulin ([place]); Other long term (current) drug therapy; Menopausal and perimenopausal disorder; Class III Morbid obesity ([place]) (254% higher incidence of chronic low back pain); Multiple sclerosis ([place]); Obstructive apnea; Degenerative arthritis of hip; Awareness of heartbeats; History of colon polyps; Post menopausal syndrome; Pure hypercholesterolemia; Enlargement of spleen; B12 deficiency; Fibromyalgia; Lumbar spondylosis; Grade 1 Anterolisthesis of L4 over L5; Chronic sacroiliac joint pain (bilateral); Chronic hip pain (Left); Arthropathy of left hip (Severe chronic left hip DJD); Osteoarthritis of hip (Left); Lumbar facet arthropathy; Lumbar facet hypertrophy;  Lumbar facet syndrome; Chronic flank pain (Right); Chronic lower extremity pain (Right); Encounter for chronic pain management; and Chronic pain syndrome on her problem list. Her primarily concern today is the Hip Pain (left)  Pain Assessment: Self-Reported Pain Score: 5 /10 Clinically the patient looks like a 2/10 Reported level is inconsistent with clinical observations. Information on the proper use of the pain scale provided to the patient today Pain Type: Chronic pain Pain Location: Hip Pain Orientation: Left Pain Descriptors / Indicators: Aching, Constant, Radiating, Stabbing Pain Frequency: Constant  Tiffany Herring was last seen on 11/04/2015 for medication management. During today's appointment we reviewed Tiffany Herring's chronic pain status, as well as her outpatient medication regimen.  The patient  reports that she does not use drugs. Her body mass index is 56.15 kg/m.  Further details on both, my assessment(s), as well as the proposed treatment plan, please see below.  Controlled Substance Pharmacotherapy Assessment REMS (Risk Evaluation and Mitigation Strategy)  Analgesic:Morphine ER 30 mg every 8 hours (90 mg/day) + hydrocodone/APAP 5/325 one every 6 hours (20 mg/dayof hydrocodone) MME/day:178m/day.  .Evon Slack RN  02/03/2016  1:34 PM  Sign at close encounter Nursing Pain Medication Assessment:  Safety precautions to be maintained throughout the outpatient stay will include: orient to surroundings, keep bed in low position, maintain call bell within reach at all times, provide assistance with transfer out of bed and ambulation.  Medication Inspection Compliance: Pill count conducted under aseptic conditions, in front of the patient. Neither the pills nor the bottle was removed from the patient's sight at any time. Once count was completed pills were immediately returned to the patient in their original bottle.  Medication #1: Hydrocodone/APAP Pill Count: 48 of 120  pills remain Bottle Appearance: Standard pharmacy container. Clearly labeled. Filled Date: 01 / 05 / 2018 Medication last intake: 02/03/2016  Medication #2: Morphine ER (MSContin) Pill Count:  36 of 90 pills remain Bottle Appearance: Standard pharmacy container. Clearly labeled. Filled Date: 01 / 05 / 2018   Pharmacokinetics: Liberation and absorption (onset of action): WNL Distribution (time to peak effect): WNL Metabolism and excretion (duration of action): WNL         Pharmacodynamics: Desired effects: Analgesia: Tiffany Herring reports >50% benefit. Functional ability: Patient reports that medication allows her to accomplish basic ADLs Clinically meaningful improvement in function (CMIF): Sustained CMIF goals met Perceived effectiveness: Described as relatively effective, allowing for increase in activities of daily living (ADL) Undesirable effects: Side-effects or Adverse reactions: None reported Monitoring: [place] PMP: Online review of the past 79-monthperiod conducted. Compliant with practice rules and regulations List of all UDS test(s) done:  Lab Results  Component Value Date   TOXASSSELUR FINAL 05/10/2015   TOXASSSELUR FINAL 02/15/2015   TOXASSSELUR FINAL 11/16/2014   Last UDS on record: Tox[employer] Select 13  Date Value Ref Range Status  05/10/2015 FINAL  Final    Comment:    ==================================================================== TOXASSURE SELECT 13 (MW) ==================================================================== Test                             Result       Flag       Units Drug Present and Declared for Prescription Verification   Hydrocodone                    1321         EXPECTED   ng/mg creat   Hydromorphone                  182          EXPECTED   ng/mg creat   Dihydrocodeine                 189          EXPECTED   ng/mg creat   Norhydrocodone                 1055         EXPECTED   ng/mg creat    Sources of hydrocodone include scheduled  prescription    medications. Hydromorphone, dihydrocodeine and norhydrocodone are    expected metabolites of hydrocodone. Hydromorphone and    dihydrocodeine are also available as scheduled prescription    medications. Drug Present not Declared for Prescription Verification   Morphine                       14595        UNEXPECTED ng/mg creat    Potential sources of large amounts of morphine in the absence of    codeine include administration of morphine or use of heroin. ==================================================================== Test                      Result    Flag   Units      Ref Range   Creatinine              38               mg/dL      >=20 ==================================================================== Declared Medications:  The flagging and interpretation on this report are based on the  following declared medications.  Unexpected results may arise from  inaccuracies in the declared medications.  **Note: The testing scope of this panel includes these medications:  Hydrocodone (Norco)  **Note: The testing scope of this panel does not include following  reported medications:  Acetaminophen (Norco)  Albuterol (Proventil)  Aspirin (Aspirin 81)  Bupropion (Wellbutrin)  Cyanocobalamin  Enalapril (Vasotec)  Furosemide (Lasix)  Insulin (Novolin)  Vitamin D3 ==================================================================== For clinical consultation, please call [phone]. ====================================================================    UDS interpretation: Compliant          Medication Assessment Form: Reviewed. Patient indicates being compliant with therapy Treatment compliance: Compliant Risk Assessment Profile: Aberrant behavior: See prior evaluations. None observed or detected today Comorbid factors increasing risk of overdose: See prior notes. No additional risks detected today Risk of substance use disorder (SUD): Low Opioid Risk Tool  (ORT) Total Score: 1  Interpretation Table:  Score <3 = Low Risk for SUD  Score between 4-7 = Moderate Risk for SUD  Score >8 = High Risk for Opioid Abuse   Risk Mitigation Strategies:  Patient Counseling: Covered Patient-Prescriber Agreement (PPA): Present and active  Notification to other healthcare providers: Done  Pharmacologic Plan: No change in therapy, at this time  Laboratory Chemistry  Inflammation Markers Lab Results  Component Value Date   ESRSEDRATE 28 02/15/2015   CRP 2.0 (H) 02/15/2015   Renal Function Lab Results  Component Value Date   BUN 27 (H) 02/15/2015   CREATININE 1.42 (H) 02/15/2015   GFRAA 47 (L) 02/15/2015   GFRNONAA 41 (L) 02/15/2015   Hepatic Function Lab Results  Component Value Date   AST 16 02/15/2015   ALT 19 02/15/2015   ALBUMIN 4.3 02/15/2015   Electrolytes Lab Results  Component Value Date   NA 138 02/15/2015   K 4.8 02/15/2015   CL 102 02/15/2015   CALCIUM 9.4 02/15/2015   MG 2.0 02/15/2015   Pain Modulating Vitamins No results found for: [place], VD125OH2TOT, XH3716RC7, EL3810FB5, 25OHVITD1, 25OHVITD2, 25OHVITD3, VITAMINB12 Coagulation Parameters No results found for: INR, LABPROT, APTT, PLT Cardiovascular No results found for: BNP, HGB, HCT Note: Lab results reviewed.  Recent Diagnostic Imaging Review  No results found. Note: Imaging results reviewed.          Meds  The patient has a current medication list which includes the following prescription(s): albuterol, albuterol, b-d ins syr ultrafine 1cc/30g, fifty50 glucose meter 2.0, bupropion, vitamin d3, enalapril, furosemide, gabapentin, blood glucose test strips, glucose blood, hydrocodone-acetaminophen, hydrocodone-acetaminophen, hydrocodone-acetaminophen, insulin nph-regular human, insulin regular, insulin syringe .3cc/29gx1/2", accu-chek multiclix, mirtazapine, montelukast, morphine, morphine, morphine, oxybutynin, tizanidine, triamcinolone cream, and vitamin  b-12.  Current Outpatient Prescriptions on File Prior to Visit  Medication Sig  . albuterol (PROAIR HFA) 108 (90 Base) MCG/ACT inhaler Inhale 1 puff into the lungs as needed.   . B-D INS SYR ULTRAFINE 1CC/30G 30G X 1/2" 1 ML MISC   . buPROPion (WELLBUTRIN SR) 150 MG 12 hr tablet Take 150 mg by mouth 2 (two) times daily.   . Cholecalciferol (VITAMIN D3) 5000 units CAPS Take 1 capsule by mouth daily.   . furosemide (LASIX) 20 MG tablet Take 20 mg by mouth daily.   . Glucose Blood (BLOOD GLUCOSE TEST STRIPS) STRP 1 each by Other route as needed. Use as instructed  . insulin regular (NOVOLIN R,HUMULIN R) 100 units/mL injection SSI: 151-200: 2 units, 201-250: 4 units, 251-300:6 units, 301-350:8 units, 351-400: 10 units > 400 call MD  . Insulin Syringe-Needle U-100 (INSULIN SYRINGE .3CC/29GX1/2") 29G X 1/2" 0.3 ML MISC   . Lancets (ACCU-CHEK MULTICLIX) lancets 1 each.  . mirtazapine (REMERON) 15 MG tablet Take 15 mg by mouth  at bedtime.   . montelukast (SINGULAIR) 10 MG tablet Take 10 mg by mouth daily.   Marland Kitchen oxybutynin (DITROPAN XL) 10 MG 24 hr tablet Take 10 mg by mouth daily.   Marland Kitchen tiZANidine (ZANAFLEX) 4 MG tablet Take 4 mg by mouth every 6 (six) hours.   . triamcinolone cream (KENALOG) 0.5 % Apply 1 application topically 2 (two) times daily.   . vitamin B-12 (CYANOCOBALAMIN) 1000 MCG tablet 1,000 mcg. Take 1,000 mcg by mouth daily.   No current facility-administered medications on file prior to visit.    ROS  Constitutional: Denies any fever or chills Gastrointestinal: No reported hemesis, hematochezia, vomiting, or acute GI distress Musculoskeletal: Denies any acute onset joint swelling, redness, loss of ROM, or weakness Neurological: No reported episodes of acute onset apraxia, aphasia, dysarthria, agnosia, amnesia, paralysis, loss of coordination, or loss of consciousness  Allergies  Tiffany Herring is allergic to augmentin [amoxicillin-pot clavulanate]; sulfa antibiotics; and mobic  [meloxicam].  [place]  Drug: Tiffany Herring  reports that she does not use drugs. Alcohol:  reports that she does not drink alcohol. Tobacco:  reports that she has quit smoking. She has never used smokeless tobacco. Medical:  has a past medical history of Allergy; Arthritis; Asthma; Chronic kidney disease; Depression; Diabetes mellitus without complication ([place]); Hypertension; Multiple sclerosis ([place]); Neuromuscular disorder ([place]); and Sleep apnea. Family: family history includes Dementia in her mother; Depression in her mother; Diabetes in her mother; Hyperlipidemia in her mother; Stroke in her father.  Past Surgical History:  Procedure Laterality Date  . CESAREAN SECTION  1991 and 1996   x 2   Constitutional Exam  General appearance: Well nourished, well developed, and well hydrated. In no apparent acute distress Vitals:   02/03/16 1312  BP: 116/60  Pulse: 88  Resp: 16  Temp: 98.6 F (37 C)  TempSrc: Oral  SpO2: 94%  Weight: (!) 307 lb (139.3 kg)  Height: 5' 2"  (1.575 m)   BMI Assessment: Estimated body mass index is 56.15 kg/m as calculated from the following:   Height as of this encounter: 5' 2"  (1.575 m).   Weight as of this encounter: 307 lb (139.3 kg).  BMI interpretation table: BMI level Category Range association with higher incidence of chronic pain  <18 kg/m2 Underweight   18.5-24.9 kg/m2 Ideal body weight   25-29.9 kg/m2 Overweight Increased incidence by 20%  30-34.9 kg/m2 Obese (Class I) Increased incidence by 68%  35-39.9 kg/m2 Severe obesity (Class II) Increased incidence by 136%  >40 kg/m2 Extreme obesity (Class III) Increased incidence by 254%   BMI Readings from Last 4 Encounters:  02/03/16 56.15 kg/m  11/04/15 54.87 kg/m  08/05/15 57.63 kg/m  05/10/15 55.24 kg/m   Wt Readings from Last 4 Encounters:  02/03/16 (!) 307 lb (139.3 kg)  11/04/15 300 lb (136.1 kg)  08/05/15 (!) 305 lb (138.3 kg)  05/10/15 (!) 302 lb (137 kg)  Psych/Mental status:  Alert, oriented x 3 (person, place, & time)       Eyes: PERLA Respiratory: No evidence of acute respiratory distress  Cervical Spine Exam  Inspection: No masses, redness, or swelling Alignment: Symmetrical Functional ROM: Unrestricted ROM Stability: No instability detected Muscle strength & Tone: Functionally intact Sensory: Unimpaired Palpation: Non-contributory  Upper Extremity (UE) Exam    Side: Right upper extremity  Side: Left upper extremity  Inspection: No masses, redness, swelling, or asymmetry  Inspection: No masses, redness, swelling, or asymmetry  Functional ROM: Unrestricted ROM  Functional ROM: Unrestricted ROM          Muscle strength & Tone: Functionally intact  Muscle strength & Tone: Functionally intact  Sensory: Unimpaired  Sensory: Unimpaired  Palpation: Non-contributory  Palpation: Non-contributory   Thoracic Spine Exam  Inspection: No masses, redness, or swelling Alignment: Symmetrical Functional ROM: Unrestricted ROM Stability: No instability detected Sensory: Unimpaired Muscle strength & Tone: Functionally intact Palpation: Non-contributory  Lumbar Spine Exam  Inspection: No masses, redness, or swelling Alignment: Symmetrical Functional ROM: Unrestricted ROM Stability: No instability detected Muscle strength & Tone: Functionally intact Sensory: Unimpaired Palpation: Non-contributory Provocative Tests: Lumbar Hyperextension and rotation test: evaluation deferred today       Patrick's Maneuver: evaluation deferred today              Gait & Posture Assessment  Ambulation: Patient ambulates using a wheel chair Gait: Limited. Using assistive device to ambulate Posture: Slouching   Lower Extremity Exam    Side: Right lower extremity  Side: Left lower extremity  Inspection: No masses, redness, swelling, or asymmetry  Inspection: No masses, redness, swelling, or asymmetry  Functional ROM: Unrestricted ROM          Functional ROM: Unrestricted  ROM          Muscle strength & Tone: Functionally intact  Muscle strength & Tone: Functionally intact  Sensory: Unimpaired  Sensory: Unimpaired  Palpation: Non-contributory  Palpation: Non-contributory   Assessment  Primary Diagnosis & Pertinent Problem List: The primary encounter diagnosis was Chronic pain syndrome. Diagnoses of Chronic bilateral low back pain with right-sided sciatica, Chronic lumbar radicular pain (Right), Chronic lower extremity pain (Right), Failed back surgical syndrome, Grade 1 Anterolisthesis of L4 over L5, Long term current use of opiate analgesic, Opiate use (80 MME/Day), and Class III Morbid obesity ([facility]) (254% higher incidence of chronic low back pain) were also pertinent to this visit.  Status Diagnosis  Controlled Controlled Controlled 1. Chronic pain syndrome   2. Chronic bilateral low back pain with right-sided sciatica   3. Chronic lumbar radicular pain (Right)   4. Chronic lower extremity pain (Right)   5. Failed back surgical syndrome   6. Grade 1 Anterolisthesis of L4 over L5   7. Long term current use of opiate analgesic   8. Opiate use (80 MME/Day)   9. Class III Morbid obesity ([facility]) (254% higher incidence of chronic low back pain)      Plan of Care  Pharmacotherapy (Medications Ordered): Meds ordered this encounter  Medications  . morphine (MS CONTIN) 30 MG 12 hr tablet    Sig: Take 1 tablet (30 mg total) by mouth every 8 (eight) hours as needed for pain.    Dispense:  90 tablet    Refill:  0    Do not place this medication, or any other prescription from our practice, on "Automatic Refill". Patient may have prescription filled one day early if pharmacy is closed on scheduled refill date. Do not fill until: 03/14/16 To last until: 04/13/16  . morphine (MS CONTIN) 30 MG 12 hr tablet    Sig: Take 1 tablet (30 mg total) by mouth every 8 (eight) hours as needed for pain.    Dispense:  90 tablet    Refill:  0    Do not place this medication, or  any other prescription from our practice, on "Automatic Refill". Patient may have prescription filled one day early if pharmacy is closed on scheduled refill date. Do not fill until: 04/13/16 To last until:  05/13/16  . morphine (MS CONTIN) 30 MG 12 hr tablet    Sig: Take 1 tablet (30 mg total) by mouth every 8 (eight) hours as needed for pain.    Dispense:  90 tablet    Refill:  0    Do not place this medication, or any other prescription from our practice, on "Automatic Refill". Patient may have prescription filled one day early if pharmacy is closed on scheduled refill date. Do not fill until: 02/13/16 To last until: 03/14/16  . HYDROcodone-acetaminophen (NORCO/VICODIN) 5-325 MG tablet    Sig: Take 1 tablet by mouth every 6 (six) hours as needed for severe pain.    Dispense:  120 tablet    Refill:  0    Do not place this medication, or any other prescription from our practice, on "Automatic Refill". Patient may have prescription filled one day early if pharmacy is closed on scheduled refill date. Do not fill until: 03/14/16 To last until: 04/13/16  . HYDROcodone-acetaminophen (NORCO/VICODIN) 5-325 MG tablet    Sig: Take 1 tablet by mouth every 6 (six) hours as needed for severe pain.    Dispense:  120 tablet    Refill:  0    Do not place this medication, or any other prescription from our practice, on "Automatic Refill". Patient may have prescription filled one day early if pharmacy is closed on scheduled refill date. Do not fill until: 04/13/16 To last until: 05/13/16  . HYDROcodone-acetaminophen (NORCO/VICODIN) 5-325 MG tablet    Sig: Take 1 tablet by mouth every 6 (six) hours as needed for severe pain.    Dispense:  120 tablet    Refill:  0    Do not place this medication, or any other prescription from our practice, on "Automatic Refill". Patient may have prescription filled one day early if pharmacy is closed on scheduled refill date. Do not fill until: 02/13/16 To last until:  03/14/16   New Prescriptions   No medications on file   Medications administered today: Tiffany Herring had no medications administered during this visit. Lab-work, procedure(s), and/or referral(s): No orders of the defined types were placed in this encounter.  Imaging and/or referral(s): None  Interventional therapies: Planned, scheduled, and/or pending:   Not at this time.   Considering:   None at this time.    Palliative PRN treatment(s):   None at this time.    Provider-requested follow-up: Return in about 3 months (around 05/03/2016) for (MD) Med-Mgmt.  No future appointments. Primary Care Physician: Hortencia Pilar, MD Location: [facility] Note by: Kathlen Brunswick. Dossie Arbour, M.D, DABA, DABAPM, DABPM, DABIPP, FIPP Date: 02/03/2016; Time: 12:04 AM  Pain Score Disclaimer: We use the NRS-11 scale. This is a self-reported, subjective measurement of pain severity with only modest accuracy. It is used primarily to identify changes within a particular patient. It must be understood that outpatient pain scales are significantly less accurate that those used for research, where they can be applied under ideal controlled circumstances with minimal exposure to variables. In reality, the score is likely to be a combination of pain intensity and pain affect, where pain affect describes the degree of emotional arousal or changes in action readiness caused by the sensory experience of pain. Factors such as social and work situation, setting, emotional state, anxiety levels, expectation, and prior pain experience may influence pain perception and show large inter-individual differences that may also be affected by time variables.  Patient instructions provided during this appointment: Patient Instructions  You were given 3 prescriptions each for Hydrocodone and MS Contin

## 2016-02-16 DIAGNOSIS — G35 Multiple sclerosis: Secondary | ICD-10-CM | POA: Diagnosis not present

## 2016-02-24 DIAGNOSIS — R161 Splenomegaly, not elsewhere classified: Secondary | ICD-10-CM | POA: Diagnosis not present

## 2016-03-27 DIAGNOSIS — I10 Essential (primary) hypertension: Secondary | ICD-10-CM | POA: Diagnosis not present

## 2016-03-27 DIAGNOSIS — Z794 Long term (current) use of insulin: Secondary | ICD-10-CM | POA: Diagnosis not present

## 2016-03-27 DIAGNOSIS — E118 Type 2 diabetes mellitus with unspecified complications: Secondary | ICD-10-CM | POA: Diagnosis not present

## 2016-03-27 DIAGNOSIS — G35 Multiple sclerosis: Secondary | ICD-10-CM | POA: Diagnosis not present

## 2016-03-27 DIAGNOSIS — E1165 Type 2 diabetes mellitus with hyperglycemia: Secondary | ICD-10-CM | POA: Diagnosis not present

## 2016-05-04 ENCOUNTER — Ambulatory Visit: Payer: Medicare HMO | Attending: Nurse Practitioner | Admitting: Nurse Practitioner

## 2016-05-04 ENCOUNTER — Ambulatory Visit: Payer: Medicare HMO | Admitting: Pain Medicine

## 2016-05-04 ENCOUNTER — Encounter: Payer: Self-pay | Admitting: Nurse Practitioner

## 2016-05-04 ENCOUNTER — Encounter (INDEPENDENT_AMBULATORY_CARE_PROVIDER_SITE_OTHER): Payer: Self-pay

## 2016-05-04 VITALS — BP 121/81 | HR 88 | Temp 98.5°F | Resp 16 | Ht 62.0 in | Wt 308.0 lb

## 2016-05-04 DIAGNOSIS — Z794 Long term (current) use of insulin: Secondary | ICD-10-CM | POA: Insufficient documentation

## 2016-05-04 DIAGNOSIS — Z6841 Body Mass Index (BMI) 40.0 and over, adult: Secondary | ICD-10-CM | POA: Insufficient documentation

## 2016-05-04 DIAGNOSIS — Z823 Family history of stroke: Secondary | ICD-10-CM | POA: Insufficient documentation

## 2016-05-04 DIAGNOSIS — M961 Postlaminectomy syndrome, not elsewhere classified: Secondary | ICD-10-CM | POA: Diagnosis not present

## 2016-05-04 DIAGNOSIS — I129 Hypertensive chronic kidney disease with stage 1 through stage 4 chronic kidney disease, or unspecified chronic kidney disease: Secondary | ICD-10-CM | POA: Insufficient documentation

## 2016-05-04 DIAGNOSIS — Z5181 Encounter for therapeutic drug level monitoring: Secondary | ICD-10-CM | POA: Insufficient documentation

## 2016-05-04 DIAGNOSIS — Z880 Allergy status to penicillin: Secondary | ICD-10-CM | POA: Diagnosis not present

## 2016-05-04 DIAGNOSIS — Z79891 Long term (current) use of opiate analgesic: Secondary | ICD-10-CM

## 2016-05-04 DIAGNOSIS — N183 Chronic kidney disease, stage 3 (moderate): Secondary | ICD-10-CM | POA: Diagnosis not present

## 2016-05-04 DIAGNOSIS — M25552 Pain in left hip: Secondary | ICD-10-CM | POA: Diagnosis not present

## 2016-05-04 DIAGNOSIS — Z87891 Personal history of nicotine dependence: Secondary | ICD-10-CM | POA: Insufficient documentation

## 2016-05-04 DIAGNOSIS — E1122 Type 2 diabetes mellitus with diabetic chronic kidney disease: Secondary | ICD-10-CM | POA: Insufficient documentation

## 2016-05-04 DIAGNOSIS — Z79899 Other long term (current) drug therapy: Secondary | ICD-10-CM | POA: Insufficient documentation

## 2016-05-04 DIAGNOSIS — G35 Multiple sclerosis: Secondary | ICD-10-CM | POA: Diagnosis not present

## 2016-05-04 DIAGNOSIS — G8929 Other chronic pain: Secondary | ICD-10-CM

## 2016-05-04 DIAGNOSIS — G894 Chronic pain syndrome: Secondary | ICD-10-CM

## 2016-05-04 DIAGNOSIS — G9619 Other disorders of meninges, not elsewhere classified: Secondary | ICD-10-CM

## 2016-05-04 DIAGNOSIS — G96198 Other disorders of meninges, not elsewhere classified: Secondary | ICD-10-CM

## 2016-05-04 MED ORDER — HYDROCODONE-ACETAMINOPHEN 5-325 MG PO TABS: 1.0000 | ORAL_TABLET | Freq: Four times a day (QID) | ORAL | 0 refills | Status: DC | PRN

## 2016-05-04 MED ORDER — MORPHINE SULFATE ER 30 MG PO TBCR: 30.0000 mg | EXTENDED_RELEASE_TABLET | Freq: Three times a day (TID) | ORAL | 0 refills | Status: DC | PRN

## 2016-05-04 NOTE — Progress Notes (Signed)
Nursing Pain Medication Assessment:  Safety precautions to be maintained throughout the outpatient stay will include: orient to surroundings, keep bed in low position, maintain call bell within reach at all times, provide assistance with transfer out of bed and ambulation.  Medication Inspection Compliance: Pill count conducted under aseptic conditions, in front of the patient. Neither the pills nor the bottle was removed from the patient's sight at any time. Once count was completed pills were immediately returned to the patient in their original bottle.  Medication #1: Hydrocodone/APAP Pill/Patch Count: 40 of 120 pills remain Pill/Patch Appearance: Markings consistent with prescribed medication Bottle Appearance: Standard pharmacy container. Clearly labeled. Filled Date: 04 / 05 / 2018 Last Medication intake:  Today  Medication #2: Morphine ER (MSContin) Pill/Patch Count: 30 of 90 pills remain Pill/Patch Appearance: Markings consistent with prescribed medication Bottle Appearance: Standard pharmacy container. Clearly labeled. Filled Date: 04 / 05 / 2018 Last Medication intake:  Today

## 2016-05-04 NOTE — Progress Notes (Signed)
Patient's Name: Tiffany Herring  MRN: [id number]  Referring Provider: Hortencia Pilar, MD  DOB: 04-May-1960  PCP: Hortencia Pilar, MD  DOS: 05/04/2016  Note by: Vevelyn Francois NP  Service setting: Ambulatory outpatient  Specialty: Interventional Pain Management  Location: [facility] (AMB) Pain Management Facility    Patient type: Established    Primary Reason(s) for Visit: Encounter for prescription drug management (Level of risk: moderate) CC: Hip Pain (left ) and Spasms (generalized)  HPI  Tiffany Herring is a 56 y.o. year old, female patient, who comes today for a medication management evaluation. She has Failed back surgical syndrome; Epidural fibrosis; Chronic bilateral low back pain with right-sided sciatica; Chronic lumbar radicular pain (Right); Long term current use of opiate analgesic; Long term prescription opiate use; Opiate use (80 MME/Day); Opiate dependence ([place]); Encounter for therapeutic drug level monitoring; Allergic rhinitis; Carotid artery bruit; Chronic kidney disease (CKD), stage III (moderate); Colon polyp; Diabetes mellitus ([place]); Dysthymia; Essential (primary) hypertension; Generalized OA; Acid reflux; History of intermenstrual bleeding; History of migraine headaches; H/O nutritional disorder; Hypercholesterolemia; Type 2 diabetes mellitus treated with insulin ([place]); Other long term (current) drug therapy; Menopausal and perimenopausal disorder; Class III Morbid obesity ([place]) (254% higher incidence of chronic low back pain); Multiple sclerosis ([place]); Obstructive apnea; Degenerative arthritis of hip; Awareness of heartbeats; History of colon polyps; Post menopausal syndrome; Pure hypercholesterolemia; Enlargement of spleen; B12 deficiency; Fibromyalgia; Lumbar spondylosis; Grade 1 Anterolisthesis of L4 over L5; Chronic sacroiliac joint pain (bilateral); Chronic hip pain (Left); Arthropathy of left hip (Severe chronic left hip DJD); Osteoarthritis of hip (Left); Lumbar facet arthropathy ([place]);  Lumbar facet hypertrophy; Lumbar facet syndrome; Chronic flank pain (Right); Chronic lower extremity pain (Right); Encounter for chronic pain management; and Chronic pain syndrome on her problem list. Her primarily concern today is the Hip Pain (left ) and Spasms (generalized)  Pain Assessment: Self-Reported Pain Score: 5 /10 Clinically the patient looks like a 2/10 Reported level is compatible with observation. Information on the proper use of the pain scale provided to the patient today Pain Type: Chronic pain Pain Location: Hip Pain Orientation: Left Pain Descriptors / Indicators: Sharp, Shooting, Jabbing, Constant Pain Frequency: Constant  Tiffany Herring was last scheduled for an appointment on01/25/18 for medication management. During today's appointment we reviewed Tiffany Herring's chronic pain status, as well as her outpatient medication regimen. She has chronic left hip pain. She states that she has generalized pain but it is worse in her hip. She has MS and is only able a few steps.   The patient  reports that she does not use drugs. Her body mass index is 56.33 kg/m.  Further details on both, my assessment(s), as well as the proposed treatment plan, please see below.  Controlled Substance Pharmacotherapy Assessment REMS (Risk Evaluation and Mitigation Strategy)  Analgesic:Morphine ER 30 mg every 8 hours (90 mg/day) + hydrocodone/APAP 5/325 one every 6 hours (20 mg/dayof hydrocodone) MME/day:151m/day.   PJanett Billow RN  05/04/2016 10:16 AM  Sign at close encounter Nursing Pain Medication Assessment:  Safety precautions to be maintained throughout the outpatient stay will include: orient to surroundings, keep bed in low position, maintain call bell within reach at all times, provide assistance with transfer out of bed and ambulation.  Medication Inspection Compliance: Pill count conducted under aseptic conditions, in front of the patient. Neither the pills nor the bottle was  removed from the patient's sight at any time. Once count was completed pills were immediately returned to the patient in  their original bottle.  Medication #1: Hydrocodone/APAP Pill/Patch Count: 40 of 120 pills remain Pill/Patch Appearance: Markings consistent with prescribed medication Bottle Appearance: Standard pharmacy container. Clearly labeled. Filled Date: 04 / 05 / 2018 Last Medication intake:  Today  Medication #2: Morphine ER (MSContin) Pill/Patch Count: 30 of 90 pills remain Pill/Patch Appearance: Markings consistent with prescribed medication Bottle Appearance: Standard pharmacy container. Clearly labeled. Filled Date: 04 / 05 / 2018 Last Medication intake:  Today   Pharmacokinetics: Liberation and absorption (onset of action): WNL Distribution (time to peak effect): WNL Metabolism and excretion (duration of action): WNL         Pharmacodynamics: Desired effects: Analgesia: Ms. Mannina reports >50% benefit. Functional ability: Patient reports that medication allows her to accomplish basic ADLs Clinically meaningful improvement in function (CMIF): Sustained CMIF goals met Perceived effectiveness: Described as relatively effective, allowing for increase in activities of daily living (ADL) Undesirable effects: Side-effects or Adverse reactions: None reported Monitoring: [place] PMP: Online review of the past 77-monthperiod conducted. Compliant with practice rules and regulations List of all UDS test(s) done:  Lab Results  Component Value Date   TOXASSSELUR FINAL 05/10/2015   TOXASSSELUR FINAL 02/15/2015   TOXASSSELUR FINAL 11/16/2014   Last UDS on record: ToxAssure Select 13  Date Value Ref Range Status  05/10/2015 FINAL  Final    Comment:    ==================================================================== TOXASSURE SELECT 13 (MW) ==================================================================== Test                             Result       Flag        Units Drug Present and Declared for Prescription Verification   Hydrocodone                    1321         EXPECTED   ng/mg creat   Hydromorphone                  182          EXPECTED   ng/mg creat   Dihydrocodeine                 189          EXPECTED   ng/mg creat   Norhydrocodone                 1055         EXPECTED   ng/mg creat    Sources of hydrocodone include scheduled prescription    medications. Hydromorphone, dihydrocodeine and norhydrocodone are    expected metabolites of hydrocodone. Hydromorphone and    dihydrocodeine are also available as scheduled prescription    medications. Drug Present not Declared for Prescription Verification   Morphine                       14595        UNEXPECTED ng/mg creat    Potential sources of large amounts of morphine in the absence of    codeine include administration of morphine or use of heroin. ==================================================================== Test                      Result    Flag   Units      Ref Range   Creatinine              38  mg/dL      >=20 ==================================================================== Declared Medications:  The flagging and interpretation on this report are based on the  following declared medications.  Unexpected results may arise from  inaccuracies in the declared medications.  **Note: The testing scope of this panel includes these medications:  Hydrocodone (Norco)  **Note: The testing scope of this panel does not include following  reported medications:  Acetaminophen (Norco)  Albuterol (Proventil)  Aspirin (Aspirin 81)  Bupropion (Wellbutrin)  Cyanocobalamin  Enalapril (Vasotec)  Furosemide (Lasix)  Insulin (Novolin)  Vitamin D3 ==================================================================== For clinical consultation, please call [phone]. ====================================================================    UDS interpretation: Compliant           Medication Assessment Form: Reviewed. Patient indicates being compliant with therapy Treatment compliance: Compliant Risk Assessment Profile: Aberrant behavior: See prior evaluations. None observed or detected today Comorbid factors increasing risk of overdose: See prior notes. No additional risks detected today Risk of substance use disorder (SUD): Low Opioid Risk Tool (ORT) Total Score: 3  Interpretation Table:  Score <3 = Low Risk for SUD  Score between 4-7 = Moderate Risk for SUD  Score >8 = High Risk for Opioid Abuse   Risk Mitigation Strategies:  Patient Counseling: Covered Patient-Prescriber Agreement (PPA): Present and active  Notification to other healthcare providers: Done  Pharmacologic Plan: No change in therapy, at this time  Laboratory Chemistry  Inflammation Markers Lab Results  Component Value Date   CRP 2.0 (H) 02/15/2015   ESRSEDRATE 28 02/15/2015   (CRP: Acute Phase) (ESR: Chronic Phase) Renal Function Markers Lab Results  Component Value Date   BUN 27 (H) 02/15/2015   CREATININE 1.42 (H) 02/15/2015   GFRAA 47 (L) 02/15/2015   GFRNONAA 41 (L) 02/15/2015   Hepatic Function Markers Lab Results  Component Value Date   AST 16 02/15/2015   ALT 19 02/15/2015   ALBUMIN 4.3 02/15/2015   ALKPHOS 77 02/15/2015   Electrolytes Lab Results  Component Value Date   NA 138 02/15/2015   K 4.8 02/15/2015   CL 102 02/15/2015   CALCIUM 9.4 02/15/2015   MG 2.0 02/15/2015   Neuropathy Markers No results found for: [id number] Bone Pathology Markers Lab Results  Component Value Date   ALKPHOS 77 02/15/2015   CALCIUM 9.4 02/15/2015   Coagulation Parameters No results found for: INR, LABPROT, APTT, PLT Cardiovascular Markers No results found for: BNP, HGB, HCT Note: Lab results reviewed.  Recent Diagnostic Imaging Review  No results found. Note: Imaging results reviewed.          Meds  The patient has a current medication list which includes the  following prescription(s): albuterol, b-d ins syr ultrafine 1cc/30g, fifty50 glucose meter 2.0, bupropion, vitamin d3, enalapril, furosemide, gabapentin, blood glucose test strips, glucose blood, hydrocodone-acetaminophen, insulin nph-regular human, insulin regular, accu-chek multiclix, mirtazapine, montelukast, morphine, oxybutynin, tizanidine, triamcinolone cream, vitamin b-12, hydrocodone-acetaminophen, hydrocodone-acetaminophen, morphine, and morphine.  Current Outpatient Prescriptions on File Prior to Visit  Medication Sig  . albuterol (PROAIR HFA) 108 (90 Base) MCG/ACT inhaler Inhale 1 puff into the lungs as needed.   . B-D INS SYR ULTRAFINE 1CC/30G 30G X 1/2" 1 ML MISC   . Blood Glucose Monitoring Suppl (FIFTY50 GLUCOSE METER 2.0) w/Device KIT Check sugars three times daily.  On insulin.  E11.9  . buPROPion (WELLBUTRIN SR) 150 MG 12 hr tablet Take 150 mg by mouth 2 (two) times daily.   . Cholecalciferol (VITAMIN D3) 5000 units CAPS Take 1 capsule by mouth daily.   Marland Kitchen  enalapril (VASOTEC) 20 MG tablet Take by mouth.  . furosemide (LASIX) 20 MG tablet Take 20 mg by mouth daily.   Marland Kitchen gabapentin (NEURONTIN) 300 MG capsule 600 mg 3 (three) times daily.   . Glucose Blood (BLOOD GLUCOSE TEST STRIPS) STRP 1 each by Other route as needed. Use as instructed  . glucose blood ([employer] TEST STRIPS) test strip Check sugars four times daily.  On insulin.  E11.65  . insulin NPH-regular Human (NOVOLIN 70/30) (70-30) 100 UNIT/ML injection INJECT 60 UNITS SQ 30 MINUTES BEFORE BREAKFAST AND 60 UNITS 30 MINS BEFORE SUPPER  . insulin regular (NOVOLIN R,HUMULIN R) 100 units/mL injection SSI: 151-200: 2 units, 201-250: 4 units, 251-300:6 units, 301-350:8 units, 351-400: 10 units > 400 call MD  . Lancets (ACCU-CHEK MULTICLIX) lancets 1 each.  . mirtazapine (REMERON) 15 MG tablet Take 15 mg by mouth at bedtime.   . montelukast (SINGULAIR) 10 MG tablet Take 10 mg by mouth daily.   Marland Kitchen oxybutynin (DITROPAN XL) 10 MG 24 hr  tablet Take 10 mg by mouth daily.   Marland Kitchen tiZANidine (ZANAFLEX) 4 MG tablet Take 4 mg by mouth every 6 (six) hours.   . triamcinolone cream (KENALOG) 0.5 % Apply 1 application topically 2 (two) times daily.   . vitamin B-12 (CYANOCOBALAMIN) 1000 MCG tablet 1,000 mcg. Take 1,000 mcg by mouth daily.   No current facility-administered medications on file prior to visit.    ROS  Constitutional: Denies any fever or chills Gastrointestinal: No reported hemesis, hematochezia, vomiting, or acute GI distress Musculoskeletal: Denies any acute onset joint swelling, redness, loss of ROM, or weakness Neurological: No reported episodes of acute onset apraxia, aphasia, dysarthria, agnosia, amnesia, paralysis, loss of coordination, or loss of consciousness  Allergies  Ms. Stuckert is allergic to augmentin [amoxicillin-pot clavulanate]; sulfa antibiotics; and mobic [meloxicam].  [place]  Drug: Ms. Boehm  reports that she does not use drugs. Alcohol:  reports that she does not drink alcohol. Tobacco:  reports that she has quit smoking. She has never used smokeless tobacco. Medical:  has a past medical history of Allergy; Arthritis; Asthma; Chronic kidney disease; Depression; Diabetes mellitus without complication ([place]); Hypertension; Multiple sclerosis ([place]); Neuromuscular disorder ([place]); and Sleep apnea. Family: family history includes Dementia in her mother; Depression in her mother; Diabetes in her mother; Hyperlipidemia in her mother; Stroke in her father.  Past Surgical History:  Procedure Laterality Date  . CESAREAN SECTION  1991 and 1996   x 2   Constitutional Exam  General appearance: Well nourished, well developed, and well hydrated. In no apparent acute distress Vitals:   05/04/16 1004  BP: 121/81  Pulse: 88  Resp: 16  Temp: 98.5 F (36.9 C)  TempSrc: Oral  SpO2: 97%  Weight: (!) 308 lb (139.7 kg)  Height: 5' 2"  (1.575 m)   BMI Assessment: Estimated body mass index is 56.33 kg/m as  calculated from the following:   Height as of this encounter: 5' 2"  (1.575 m).   Weight as of this encounter: 308 lb (139.7 kg).  BMI interpretation table: BMI level Category Range association with higher incidence of chronic pain  <18 kg/m2 Underweight   18.5-24.9 kg/m2 Ideal body weight   25-29.9 kg/m2 Overweight Increased incidence by 20%  30-34.9 kg/m2 Obese (Class I) Increased incidence by 68%  35-39.9 kg/m2 Severe obesity (Class II) Increased incidence by 136%  >40 kg/m2 Extreme obesity (Class III) Increased incidence by 254%   BMI Readings from Last 4 Encounters:  05/04/16 56.33 kg/m  02/03/16 56.15 kg/m  11/04/15 54.87 kg/m  08/05/15 57.63 kg/m   Wt Readings from Last 4 Encounters:  05/04/16 (!) 308 lb (139.7 kg)  02/03/16 (!) 307 lb (139.3 kg)  11/04/15 300 lb (136.1 kg)  08/05/15 (!) 305 lb (138.3 kg)  Psych/Mental status: Alert, oriented x 3 (person, place, & time)       Eyes: PERLA Respiratory: No evidence of acute respiratory distress  Cervical Spine Exam  Inspection: No masses, redness, or swelling Alignment: Symmetrical Functional ROM: Unrestricted ROM      Stability: No instability detected Muscle strength & Tone: Functionally intact Sensory: Unimpaired Palpation: No palpable anomalies              Upper Extremity (UE) Exam    Side: Right upper extremity  Side: Left upper extremity  Inspection: No masses, redness, swelling, or asymmetry. No contractures  Inspection: No masses, redness, swelling, or asymmetry. No contractures  Functional ROM: Unrestricted ROM          Functional ROM: Unrestricted ROM          Muscle strength & Tone: Functionally intact  Muscle strength & Tone: Functionally intact  Sensory: Unimpaired  Sensory: Unimpaired  Palpation: No palpable anomalies              Palpation: No palpable anomalies              Specialized Test(s): Deferred         Specialized Test(s): Deferred          Thoracic Spine Exam  Inspection: No masses,  redness, or swelling Alignment: Symmetrical Functional ROM: Unrestricted ROM Stability: No instability detected Sensory: Unimpaired Muscle strength & Tone: No palpable anomalies  Lumbar Spine Exam  Inspection: No masses, redness, or swelling Alignment: Symmetrical Functional ROM: Unrestricted ROM      Stability: No instability detected Muscle strength & Tone: Functionally intact Sensory: Unimpaired Palpation: No palpable anomalies       Provocative Tests: Lumbar Hyperextension and rotation test: evaluation deferred today       Patrick's Maneuver: evaluation deferred today                    Gait & Posture Assessment  Ambulation: Patient came in today in a wheel chair Gait:  Posture: WNL   Lower Extremity Exam    Side: Right lower extremity  Side: Left lower extremity  Inspection: No masses, redness, swelling, or asymmetry. No contractures  Inspection: No masses, redness, swelling, or asymmetry. No contractures  Functional ROM: Unrestricted ROM          Functional ROM: Unrestricted ROM          Muscle strength & Tone: Functionally intact  Muscle strength & Tone: Functionally intact  Sensory: Unimpaired  Sensory: Unimpaired  Palpation: No palpable anomalies  Palpation: No palpable anomalies   Assessment  Primary Diagnosis & Pertinent Problem List: The primary encounter diagnosis was Chronic hip pain (Left). Diagnoses of Multiple sclerosis ([place]), Failed back surgical syndrome, Epidural fibrosis, Chronic pain syndrome, and Long term current use of opiate analgesic were also pertinent to this visit.  Status Diagnosis  Controlled Controlled Controlled 1. Chronic hip pain (Left)   2. Multiple sclerosis ([place])   3. Failed back surgical syndrome   4. Epidural fibrosis   5. Chronic pain syndrome   6. Long term current use of opiate analgesic      Plan of Care  Pharmacotherapy (Medications Ordered): Meds ordered this encounter  Medications  .  HYDROcodone-acetaminophen  (NORCO/VICODIN) 5-325 MG tablet    Sig: Take 1 tablet by mouth every 6 (six) hours as needed for severe pain.    Dispense:  120 tablet    Refill:  0    Do not place this medication, or any other prescription from our practice, on "Automatic Refill". Patient may have prescription filled one day early if pharmacy is closed on scheduled refill date. Do not fill until: 05/13/16 To last until: 06/12/16    Order Specific Question:   Supervising Provider    Answer:   Milinda Pointer [phone]  . HYDROcodone-acetaminophen (NORCO/VICODIN) 5-325 MG tablet    Sig: Take 1 tablet by mouth every 6 (six) hours as needed for severe pain.    Dispense:  120 tablet    Refill:  0    Do not place this medication, or any other prescription from our practice, on "Automatic Refill". Patient may have prescription filled one day early if pharmacy is closed on scheduled refill date. Do not fill until: 06/12/16 To last until: 07/12/16    Order Specific Question:   Supervising Provider    Answer:   Milinda Pointer [phone]  . HYDROcodone-acetaminophen (NORCO/VICODIN) 5-325 MG tablet    Sig: Take 1 tablet by mouth every 6 (six) hours as needed for severe pain.    Dispense:  120 tablet    Refill:  0    Do not place this medication, or any other prescription from our practice, on "Automatic Refill". Patient may have prescription filled one day early if pharmacy is closed on scheduled refill date. Do not fill until: 07/12/16 To last until: 08/11/16    Order Specific Question:   Supervising Provider    Answer:   Milinda Pointer [phone]  . morphine (MS CONTIN) 30 MG 12 hr tablet    Sig: Take 1 tablet (30 mg total) by mouth every 8 (eight) hours as needed for pain.    Dispense:  90 tablet    Refill:  0    Do not place this medication, or any other prescription from our practice, on "Automatic Refill". Patient may have prescription filled one day early if pharmacy is closed on scheduled refill date. Do not fill  until: 05/13/16 To last until: 06/12/16    Order Specific Question:   Supervising Provider    Answer:   Milinda Pointer [phone]  . morphine (MS CONTIN) 30 MG 12 hr tablet    Sig: Take 1 tablet (30 mg total) by mouth every 8 (eight) hours as needed for pain.    Dispense:  90 tablet    Refill:  0    Do not place this medication, or any other prescription from our practice, on "Automatic Refill". Patient may have prescription filled one day early if pharmacy is closed on scheduled refill date. Do not fill until: 06/12/16 To last until: 07/12/16    Order Specific Question:   Supervising Provider    Answer:   Milinda Pointer [phone]  . morphine (MS CONTIN) 30 MG 12 hr tablet    Sig: Take 1 tablet (30 mg total) by mouth every 8 (eight) hours as needed for pain.    Dispense:  90 tablet    Refill:  0    Do not place this medication, or any other prescription from our practice, on "Automatic Refill". Patient may have prescription filled one day early if pharmacy is closed on scheduled refill date. Do not fill until: 07/12/16 To last until: 08/11/16    Order  Specific Question:   Supervising Provider    Answer:   Milinda Pointer [[id number]]   New Prescriptions   No medications on file   Medications administered today: Ms. Aman had no medications administered during this visit. Lab-work, procedure(s), and/or referral(s): Orders Placed This Encounter  Procedures  . ToxASSURE Select 13 (MW), Urine   Imaging and/or referral(s): None  Interventional therapies: Planned, scheduled, and/or pending:   Not at this time.   Considering:   Not at this time.   Palliative PRN treatment(s):   Not at this time.   Provider-requested follow-up: Return in about 3 months (around 08/03/2016) for Medication Mgmt.  Future Appointments Date Time Provider [place]  08/02/2016 1:00 PM Dul[place], NP [facility] None   Primary Care Physician: Hortencia Pilar, MD Location: [facility]  Outpatient Pain Management Facility Note by: Vevelyn Francois NP Date: 05/04/2016; Time: 11:15 AM  Pain Score Disclaimer: We use the NRS-11 scale. This is a self-reported, subjective measurement of pain severity with only modest accuracy. It is used primarily to identify changes within a particular patient. It must be understood that outpatient pain scales are significantly less accurate that those used for research, where they can be applied under ideal controlled circumstances with minimal exposure to variables. In reality, the score is likely to be a combination of pain intensity and pain affect, where pain affect describes the degree of emotional arousal or changes in action readiness caused by the sensory experience of pain. Factors such as social and work situation, setting, emotional state, anxiety levels, expectation, and prior pain experience may influence pain perception and show large inter-individual differences that may also be affected by time variables.  Patient instructions provided during this appointment: There are no Patient Instructions on file for this visit.

## 2016-05-12 LAB — TOXASSURE SELECT 13 (MW), URINE

## 2016-06-29 DIAGNOSIS — E119 Type 2 diabetes mellitus without complications: Secondary | ICD-10-CM | POA: Diagnosis not present

## 2016-06-29 DIAGNOSIS — I10 Essential (primary) hypertension: Secondary | ICD-10-CM | POA: Diagnosis not present

## 2016-06-29 DIAGNOSIS — L97829 Non-pressure chronic ulcer of other part of left lower leg with unspecified severity: Secondary | ICD-10-CM | POA: Diagnosis not present

## 2016-06-29 DIAGNOSIS — I872 Venous insufficiency (chronic) (peripheral): Secondary | ICD-10-CM | POA: Diagnosis not present

## 2016-06-29 DIAGNOSIS — Z794 Long term (current) use of insulin: Secondary | ICD-10-CM | POA: Diagnosis not present

## 2016-08-02 ENCOUNTER — Encounter: Payer: Self-pay | Admitting: Nurse Practitioner

## 2016-08-02 ENCOUNTER — Ambulatory Visit: Payer: Medicare HMO | Attending: Nurse Practitioner | Admitting: Nurse Practitioner

## 2016-08-02 VITALS — BP 125/75 | HR 91 | Temp 98.7°F | Resp 16 | Ht 62.0 in | Wt 315.0 lb

## 2016-08-02 DIAGNOSIS — F329 Major depressive disorder, single episode, unspecified: Secondary | ICD-10-CM | POA: Insufficient documentation

## 2016-08-02 DIAGNOSIS — E1122 Type 2 diabetes mellitus with diabetic chronic kidney disease: Secondary | ICD-10-CM | POA: Diagnosis not present

## 2016-08-02 DIAGNOSIS — R0681 Apnea, not elsewhere classified: Secondary | ICD-10-CM | POA: Insufficient documentation

## 2016-08-02 DIAGNOSIS — M4726 Other spondylosis with radiculopathy, lumbar region: Secondary | ICD-10-CM | POA: Diagnosis not present

## 2016-08-02 DIAGNOSIS — Z7982 Long term (current) use of aspirin: Secondary | ICD-10-CM | POA: Diagnosis not present

## 2016-08-02 DIAGNOSIS — M797 Fibromyalgia: Secondary | ICD-10-CM | POA: Insufficient documentation

## 2016-08-02 DIAGNOSIS — M47816 Spondylosis without myelopathy or radiculopathy, lumbar region: Secondary | ICD-10-CM | POA: Diagnosis not present

## 2016-08-02 DIAGNOSIS — M533 Sacrococcygeal disorders, not elsewhere classified: Secondary | ICD-10-CM | POA: Insufficient documentation

## 2016-08-02 DIAGNOSIS — M1990 Unspecified osteoarthritis, unspecified site: Secondary | ICD-10-CM | POA: Insufficient documentation

## 2016-08-02 DIAGNOSIS — G8929 Other chronic pain: Secondary | ICD-10-CM | POA: Diagnosis not present

## 2016-08-02 DIAGNOSIS — G43909 Migraine, unspecified, not intractable, without status migrainosus: Secondary | ICD-10-CM | POA: Insufficient documentation

## 2016-08-02 DIAGNOSIS — Z6841 Body Mass Index (BMI) 40.0 and over, adult: Secondary | ICD-10-CM | POA: Insufficient documentation

## 2016-08-02 DIAGNOSIS — I129 Hypertensive chronic kidney disease with stage 1 through stage 4 chronic kidney disease, or unspecified chronic kidney disease: Secondary | ICD-10-CM | POA: Diagnosis not present

## 2016-08-02 DIAGNOSIS — G9619 Other disorders of meninges, not elsewhere classified: Secondary | ICD-10-CM | POA: Insufficient documentation

## 2016-08-02 DIAGNOSIS — M5441 Lumbago with sciatica, right side: Secondary | ICD-10-CM | POA: Diagnosis not present

## 2016-08-02 DIAGNOSIS — Z794 Long term (current) use of insulin: Secondary | ICD-10-CM | POA: Diagnosis not present

## 2016-08-02 DIAGNOSIS — R109 Unspecified abdominal pain: Secondary | ICD-10-CM | POA: Diagnosis not present

## 2016-08-02 DIAGNOSIS — M25552 Pain in left hip: Secondary | ICD-10-CM

## 2016-08-02 DIAGNOSIS — Z86010 Personal history of colonic polyps: Secondary | ICD-10-CM | POA: Insufficient documentation

## 2016-08-02 DIAGNOSIS — G894 Chronic pain syndrome: Secondary | ICD-10-CM | POA: Diagnosis not present

## 2016-08-02 DIAGNOSIS — E7800 Pure hypercholesterolemia, unspecified: Secondary | ICD-10-CM | POA: Diagnosis not present

## 2016-08-02 DIAGNOSIS — E538 Deficiency of other specified B group vitamins: Secondary | ICD-10-CM | POA: Diagnosis not present

## 2016-08-02 DIAGNOSIS — N183 Chronic kidney disease, stage 3 (moderate): Secondary | ICD-10-CM | POA: Diagnosis not present

## 2016-08-02 DIAGNOSIS — Z79891 Long term (current) use of opiate analgesic: Secondary | ICD-10-CM | POA: Insufficient documentation

## 2016-08-02 DIAGNOSIS — J45909 Unspecified asthma, uncomplicated: Secondary | ICD-10-CM | POA: Insufficient documentation

## 2016-08-02 DIAGNOSIS — K219 Gastro-esophageal reflux disease without esophagitis: Secondary | ICD-10-CM | POA: Diagnosis not present

## 2016-08-02 DIAGNOSIS — Z87891 Personal history of nicotine dependence: Secondary | ICD-10-CM | POA: Diagnosis not present

## 2016-08-02 DIAGNOSIS — G35 Multiple sclerosis: Secondary | ICD-10-CM | POA: Diagnosis not present

## 2016-08-02 DIAGNOSIS — M1612 Unilateral primary osteoarthritis, left hip: Secondary | ICD-10-CM | POA: Insufficient documentation

## 2016-08-02 MED ORDER — HYDROCODONE-ACETAMINOPHEN 5-325 MG PO TABS: 1.0000 | ORAL_TABLET | Freq: Four times a day (QID) | ORAL | 0 refills | Status: DC | PRN

## 2016-08-02 MED ORDER — MORPHINE SULFATE ER 30 MG PO TBCR: 30.0000 mg | EXTENDED_RELEASE_TABLET | Freq: Three times a day (TID) | ORAL | 0 refills | Status: DC | PRN

## 2016-08-02 NOTE — Patient Instructions (Signed)

## 2016-08-02 NOTE — Progress Notes (Signed)
Patient's Name: Tiffany Herring  MRN: [id number]  Referring Provider: Hortencia Pilar, MD  DOB: 1960/05/11  PCP: Hortencia Pilar, MD  DOS: 08/02/2016  Note by: Vevelyn Francois NP  Service setting: Ambulatory outpatient  Specialty: Interventional Pain Management  Location: [facility] (AMB) Pain Management Facility    Patient type: Established    Primary Reason(s) for Visit: Encounter for prescription drug management. (Level of risk: moderate)  CC: Hip Pain (left)  HPI  Tiffany Herring is a 56 y.o. year old, female patient, who comes today for a medication management evaluation. She has Failed back surgical syndrome; Epidural fibrosis; Chronic bilateral low back pain with right-sided sciatica; Chronic lumbar radicular pain (Right); Long term current use of opiate analgesic; Long term prescription opiate use; Opiate use (80 MME/Day); Opiate dependence ([place]); Encounter for therapeutic drug level monitoring; Allergic rhinitis; Carotid artery bruit; Chronic kidney disease (CKD), stage III (moderate); Colon polyp; Diabetes mellitus ([place]); Dysthymia; Essential (primary) hypertension; Generalized OA; Acid reflux; History of intermenstrual bleeding; History of migraine headaches; H/O nutritional disorder; Hypercholesterolemia; Type 2 diabetes mellitus treated with insulin ([place]); Other long term (current) drug therapy; Menopausal and perimenopausal disorder; Class III Morbid obesity ([place]) (254% higher incidence of chronic low back pain); Multiple sclerosis ([place]); Obstructive apnea; Degenerative arthritis of hip; Awareness of heartbeats; History of colon polyps; Post menopausal syndrome; Pure hypercholesterolemia; Enlargement of spleen; B12 deficiency; Fibromyalgia; Lumbar spondylosis; Grade 1 Anterolisthesis of L4 over L5; Chronic sacroiliac joint pain (bilateral); Chronic hip pain (Left); Arthropathy of left hip (Severe chronic left hip DJD); Osteoarthritis of hip (Left); Lumbar facet arthropathy ([place]); Lumbar facet  hypertrophy; Lumbar facet syndrome; Chronic flank pain (Right); Chronic lower extremity pain (Right); Breast cancer screening; and Chronic pain syndrome on her problem list. Her primarily concern today is the Hip Pain (left)  Pain Assessment: Location: Left Hip Radiating: down the left leg (back of leg and wraps around the front) Onset: More than a month ago Duration: Chronic pain Quality: Aching, Constant, Radiating, Stabbing Severity: 5 /10 (self-reported pain score)  Note: Reported level is compatible with observation.                   Effect on ADL: Pace self Timing: Constant Modifying factors: medicine  Tiffany Herring was last scheduled for an appointment on 05/04/2016 for medication management. During today's appointment we reviewed Tiffany Herring's chronic pain status, as well as her outpatient medication regimen. She admits that her pain is stable. She has MS and is only able a few steps.   The patient  reports that she does not use drugs. Her body mass index is 57.61 kg/m.  Further details on both, my assessment(s), as well as the proposed treatment plan, please see below.  Controlled Substance Pharmacotherapy Assessment REMS (Risk Evaluation and Mitigation Strategy)  Analgesic:Morphine ER 30 mg every 8 hours (90 mg/day) + hydrocodone/APAP 5/325 one every 6 hours (20 mg/dayof hydrocodone) MME/day:170m/day.  PLona Millard RN  08/02/2016  1:27 PM  Sign at close encounter Nursing Pain Medication Assessment:  Safety precautions to be maintained throughout the outpatient stay will include: orient to surroundings, keep bed in low position, maintain call bell within reach at all times, provide assistance with transfer out of bed and ambulation.  Medication Inspection Compliance: Pill count conducted under aseptic conditions, in front of the patient. Neither the pills nor the bottle was removed from the patient's sight at any time. Once count was completed pills were immediately  returned to the patient in their  original bottle.  Medication #1: Hydrocodone/APAP Pill/Patch Count: 40 of 120 pills remain Pill/Patch Appearance: Markings consistent with prescribed medication Bottle Appearance: Standard pharmacy container. Clearly labeled. Filled Date:07 / 05 / 2018 Last Medication intake:  Today  Medication #2: Morphine ER (MSContin) Pill/Patch Count: 30 of 90 pills remain Pill/Patch Appearance: Markings consistent with prescribed medication Bottle Appearance: Standard pharmacy container. Clearly labeled. Filled Date: 07 / 05 / 2018 Last Medication intake:  Today   Pharmacokinetics: Liberation and absorption (onset of action): WNL Distribution (time to peak effect): WNL Metabolism and excretion (duration of action): WNL         Pharmacodynamics: Desired effects: Analgesia: Tiffany Herring reports >50% benefit. Functional ability: Patient reports that medication allows her to accomplish basic ADLs Clinically meaningful improvement in function (CMIF): Sustained CMIF goals met Perceived effectiveness: Described as relatively effective, allowing for increase in activities of daily living (ADL) Undesirable effects: Side-effects or Adverse reactions: None reported Monitoring: [place] PMP: Online review of the past 8-monthperiod conducted. Compliant with practice rules and regulations List of all UDS test(s) done:  Lab Results  Component Value Date   TOXASSSELUR FINAL 05/10/2015   TOXASSSELUR FINAL 02/15/2015   TCuthbertFINAL 11/16/2014   SUMMARY FINAL 05/04/2016   Last UDS on record: [employer] Select 13  Date Value Ref Range Status  05/10/2015 FINAL  Final    Comment:    ==================================================================== TOXASSURE SELECT 13 (MW) ==================================================================== Test                             Result       Flag       Units Drug Present and Declared for Prescription Verification    Hydrocodone                    1321         EXPECTED   ng/mg creat   Hydromorphone                  182          EXPECTED   ng/mg creat   Dihydrocodeine                 189          EXPECTED   ng/mg creat   Norhydrocodone                 1055         EXPECTED   ng/mg creat    Sources of hydrocodone include scheduled prescription    medications. Hydromorphone, dihydrocodeine and norhydrocodone are    expected metabolites of hydrocodone. Hydromorphone and    dihydrocodeine are also available as scheduled prescription    medications. Drug Present not Declared for Prescription Verification   Morphine                       14595        UNEXPECTED ng/mg creat    Potential sources of large amounts of morphine in the absence of    codeine include administration of morphine or use of heroin. ==================================================================== Test                      Result    Flag   Units      Ref Range   Creatinine              38  mg/dL      >=20 ==================================================================== Declared Medications:  The flagging and interpretation on this report are based on the  following declared medications.  Unexpected results may arise from  inaccuracies in the declared medications.  **Note: The testing scope of this panel includes these medications:  Hydrocodone (Norco)  **Note: The testing scope of this panel does not include following  reported medications:  Acetaminophen (Norco)  Albuterol (Proventil)  Aspirin (Aspirin 81)  Bupropion (Wellbutrin)  Cyanocobalamin  Enalapril (Vasotec)  Furosemide (Lasix)  Insulin (Novolin)  Vitamin D3 ==================================================================== For clinical consultation, please call [phone]. ====================================================================    Summary  Date Value Ref Range Status  05/04/2016 FINAL  Final    Comment:     ==================================================================== TOXASSURE SELECT 13 (MW) ==================================================================== Test                             Result       Flag       Units Drug Present and Declared for Prescription Verification   Morphine                       >12500       EXPECTED   ng/mg creat    Potential sources of large amounts of morphine in the absence of    codeine include administration of morphine or use of heroin.   Hydrocodone                    824          EXPECTED   ng/mg creat   Hydromorphone                  195          EXPECTED   ng/mg creat   Norhydrocodone                 920          EXPECTED   ng/mg creat    Sources of hydrocodone include scheduled prescription    medications. Hydromorphone and norhydrocodone are expected    metabolites of hydrocodone. Hydromorphone is also available as a    scheduled prescription medication. ==================================================================== Test                      Result    Flag   Units      Ref Range   Creatinine              80               mg/dL      >=20 ==================================================================== Declared Medications:  The flagging and interpretation on this report are based on the  following declared medications.  Unexpected results may arise from  inaccuracies in the declared medications.  **Note: The testing scope of this panel includes these medications:  Hydrocodone (Hydrocodone-Acetaminophen)  Morphine (MS Contin)  **Note: The testing scope of this panel does not include following  reported medications:  Acetaminophen (Hydrocodone-Acetaminophen)  Albuterol  Bupropion (Wellbutrin)  Cholecalciferol  Cyanocobalamin  Enalapril (Vasotec)  Furosemide (Lasix)  Gabapentin  Mirtazapine (Remeron)  Montelukast (Singulair)  Oxybutynin (Ditropan)  Tizanidine (Zanaflex)  Triamcinolone  (Kenalog) ==================================================================== For clinical consultation, please call [phone]. ====================================================================    UDS interpretation: Compliant          Medication Assessment Form: Reviewed. Patient indicates being compliant with therapy Treatment compliance:  Compliant Risk Assessment Profile: Aberrant behavior: See prior evaluations. None observed or detected today Comorbid factors increasing risk of overdose: See prior notes. No additional risks detected today Risk of substance use disorder (SUD): Low Opioid Risk Tool (ORT) Total Score: 1  Interpretation Table:  Score <3 = Low Risk for SUD  Score between 4-7 = Moderate Risk for SUD  Score >8 = High Risk for Opioid Abuse   Risk Mitigation Strategies:  Patient Counseling: Covered Patient-Prescriber Agreement (PPA): Present and active  Notification to other healthcare providers: Done  Pharmacologic Plan: No change in therapy, at this time  Laboratory Chemistry  Inflammation Markers (CRP: Acute Phase) (ESR: Chronic Phase) Lab Results  Component Value Date   CRP 2.0 (H) 02/15/2015   ESRSEDRATE 28 02/15/2015                 Renal Function Markers Lab Results  Component Value Date   BUN 27 (H) 02/15/2015   CREATININE 1.42 (H) 02/15/2015   GFRAA 47 (L) 02/15/2015   GFRNONAA 41 (L) 02/15/2015                 Hepatic Function Markers Lab Results  Component Value Date   AST 16 02/15/2015   ALT 19 02/15/2015   ALBUMIN 4.3 02/15/2015   ALKPHOS 77 02/15/2015                 Electrolytes Lab Results  Component Value Date   NA 138 02/15/2015   K 4.8 02/15/2015   CL 102 02/15/2015   CALCIUM 9.4 02/15/2015   MG 2.0 02/15/2015                 Neuropathy Markers No results found for: [id number]               Bone Pathology Markers Lab Results  Component Value Date   ALKPHOS 77 02/15/2015   CALCIUM 9.4 02/15/2015                  Coagulation Parameters No results found for: INR, LABPROT, APTT, PLT               Cardiovascular Markers No results found for: BNP, HGB, HCT               Note: Lab results reviewed.  Recent Diagnostic Imaging Review  No results found. Note: Imaging results reviewed.          Meds   Current Meds  Medication Sig  . albuterol (PROAIR HFA) 108 (90 Base) MCG/ACT inhaler Inhale 1 puff into the lungs as needed.   . B-D INS SYR ULTRAFINE 1CC/30G 30G X 1/2" 1 ML MISC   . Blood Glucose Monitoring Suppl (FIFTY50 GLUCOSE METER 2.0) w/Device KIT Check sugars three times daily.  On insulin.  E11.9  . buPROPion (WELLBUTRIN SR) 150 MG 12 hr tablet Take 150 mg by mouth 2 (two) times daily.   . Cholecalciferol (VITAMIN D3) 5000 units CAPS Take 1 capsule by mouth daily.   . enalapril (VASOTEC) 20 MG tablet Take 20 mg by mouth daily.   . furosemide (LASIX) 20 MG tablet Take 20 mg by mouth daily.   Marland Kitchen gabapentin (NEURONTIN) 600 MG tablet 600 mg 3 (three) times daily.   . Glucose Blood (BLOOD GLUCOSE TEST STRIPS) STRP 1 each by Other route as needed. Use as instructed  . glucose blood ([employer] TEST STRIPS) test strip Check sugars four times daily.  On insulin.  E11.65  . [  START ON 08/11/2016] HYDROcodone-acetaminophen (NORCO/VICODIN) 5-325 MG tablet Take 1 tablet by mouth every 6 (six) hours as needed for severe pain.  Marland Kitchen insulin NPH-regular Human (NOVOLIN 70/30) (70-30) 100 UNIT/ML injection INJECT 74 UNITS SQ 30 MINUTES BEFORE BREAKFAST AND 74 UNITS 30 MINS BEFORE SUPPER  . insulin regular (NOVOLIN R,HUMULIN R) 100 units/mL injection SSI: 151-200: 2 units, 201-250: 4 units, 251-300:6 units, 301-350:8 units, 351-400: 10 units > 400 call MD  . Lancets (ACCU-CHEK MULTICLIX) lancets 1 each.  . mirtazapine (REMERON) 15 MG tablet Take 15 mg by mouth at bedtime.   . montelukast (SINGULAIR) 10 MG tablet Take 10 mg by mouth daily.   Derrill Memo ON 08/11/2016] morphine (MS CONTIN) 30 MG 12 hr tablet Take 1 tablet  (30 mg total) by mouth every 8 (eight) hours as needed for pain.  Marland Kitchen oxybutynin (DITROPAN XL) 10 MG 24 hr tablet Take 10 mg by mouth daily.   Marland Kitchen tiZANidine (ZANAFLEX) 4 MG tablet Take 4 mg by mouth every 6 (six) hours.   . triamcinolone cream (KENALOG) 0.5 % Apply 1 application topically 2 (two) times daily.   . vitamin B-12 (CYANOCOBALAMIN) 1000 MCG tablet 1,000 mcg. Take 1,000 mcg by mouth daily.  . [DISCONTINUED] HYDROcodone-acetaminophen (NORCO/VICODIN) 5-325 MG tablet Take 1 tablet by mouth every 6 (six) hours as needed for severe pain.  . [DISCONTINUED] morphine (MS CONTIN) 30 MG 12 hr tablet Take 1 tablet (30 mg total) by mouth every 8 (eight) hours as needed for pain.    ROS  Constitutional: Denies any fever or chills Gastrointestinal: No reported hemesis, hematochezia, vomiting, or acute GI distress Musculoskeletal: Denies any acute onset joint swelling, redness, loss of ROM, or weakness Neurological: No reported episodes of acute onset apraxia, aphasia, dysarthria, agnosia, amnesia, paralysis, loss of coordination, or loss of consciousness  Allergies  Tiffany Herring is allergic to augmentin [amoxicillin-pot clavulanate]; sulfa antibiotics; and mobic [meloxicam].  [place]  Drug: Tiffany Herring  reports that she does not use drugs. Alcohol:  reports that she does not drink alcohol. Tobacco:  reports that she has quit smoking. She has never used smokeless tobacco. Medical:  has a past medical history of Allergy; Arthritis; Asthma; Chronic kidney disease; Depression; Diabetes mellitus without complication ([place]); Hypertension; Multiple sclerosis ([place]); Neuromuscular disorder ([place]); and Sleep apnea. Surgical: Tiffany Herring  has a past surgical history that includes Cesarean section (1991 and 1996). Family: family history includes Dementia in her mother; Depression in her mother; Diabetes in her mother; Hyperlipidemia in her mother; Stroke in her father.  Constitutional Exam  General appearance:  Well nourished, well developed, and well hydrated. In no apparent acute distress Vitals:   08/02/16 1312  BP: 125/75  Pulse: 91  Resp: 16  Temp: 98.7 F (37.1 C)  SpO2: 91%  Weight: (!) 315 lb (142.9 kg)  Height: _0  (1.575 m)   BMI Assessment: Estimated body mass index is 57.61 kg/m as calculated from the following:   Height as of this encounter: _1  (1.575 m).   Weight as of this encounter: 315 lb (142.9 kg).  BMI interpretation table: BMI level Category Range association with higher incidence of chronic pain  <18 kg/m2 Underweight   18.5-24.9 kg/m2 Ideal body weight   25-29.9 kg/m2 Overweight Increased incidence by 20%  30-34.9 kg/m2 Obese (Class I) Increased incidence by 68%  35-39.9 kg/m2 Severe obesity (Class II) Increased incidence by 136%  >40 kg/m2 Extreme obesity (Class III) Increased incidence by 254%   BMI Readings from  Last 4 Encounters:  08/02/16 57.61 kg/m  05/04/16 56.33 kg/m  02/03/16 56.15 kg/m  11/04/15 54.87 kg/m   Wt Readings from Last 4 Encounters:  08/02/16 (!) 315 lb (142.9 kg)  05/04/16 (!) 308 lb (139.7 kg)  02/03/16 (!) 307 lb (139.3 kg)  11/04/15 300 lb (136.1 kg)  Psych/Mental status: Alert, oriented x 3 (person, place, & time)       Eyes: PERLA Respiratory: No evidence of acute respiratory distress  Cervical Spine Exam  Inspection: No masses, redness, or swelling Alignment: Symmetrical Functional ROM: Unrestricted ROM      Stability: No instability detected Muscle strength & Tone: Functionally intact Sensory: Unimpaired Palpation: No palpable anomalies              Upper Extremity (UE) Exam    Side: Right upper extremity  Side: Left upper extremity  Inspection: No masses, redness, swelling, or asymmetry. No contractures  Inspection: No masses, redness, swelling, or asymmetry. No contractures  Functional ROM: Unrestricted ROM          Functional ROM: Unrestricted ROM          Muscle strength & Tone: Functionally intact  Muscle  strength & Tone: Functionally intact  Sensory: Unimpaired  Sensory: Unimpaired  Palpation: No palpable anomalies              Palpation: No palpable anomalies              Specialized Test(s): Deferred         Specialized Test(s): Deferred          Thoracic Spine Exam  Inspection: No masses, redness, or swelling Alignment: Symmetrical Functional ROM: Unrestricted ROM Stability: No instability detected Sensory: Unimpaired Muscle strength & Tone: No palpable anomalies  Lumbar Spine Exam  Inspection: No masses, redness, or swelling Alignment: Symmetrical Functional ROM: Unrestricted ROM      Stability: No instability detected Muscle strength & Tone: Functionally intact Sensory: Unimpaired Palpation: No palpable anomalies       Provocative Tests: Lumbar Hyperextension and rotation test: evaluation deferred today       Lumbar Lateral bending test: evaluation deferred today       Patrick's Maneuver: evaluation deferred today                    Gait & Posture Assessment  Ambulation: Patient came in today in a wheel chair Gait: Relatively normal for age and body habitus Posture: WNL   Lower Extremity Exam    Side: Right lower extremity  Side: Left lower extremity  Inspection: No masses, redness, swelling, or asymmetry. No contractures  Inspection: No masses, redness, swelling, or asymmetry. No contractures  Functional ROM: Unrestricted ROM          Functional ROM: Unrestricted ROM          Muscle strength & Tone: Functionally intact  Muscle strength & Tone: Functionally intact  Sensory: Unimpaired  Sensory: Unimpaired  Palpation: No palpable anomalies  Palpation: No palpable anomalies   Assessment  Primary Diagnosis & Pertinent Problem List: The primary encounter diagnosis was Chronic hip pain (Left). Diagnoses of Lumbar spondylosis, Multiple sclerosis ([place]), and Chronic pain syndrome were also pertinent to this visit.  Status Diagnosis  Stable Stable Stable 1. Chronic hip pain  (Left)   2. Lumbar spondylosis   3. Multiple sclerosis ([place])   4. Chronic pain syndrome     Problems updated and reviewed during this visit: Problem  Breast Cancer Screening   Plan  of Care  Pharmacotherapy (Medications Ordered): Meds ordered this encounter  Medications  . HYDROcodone-acetaminophen (NORCO/VICODIN) 5-325 MG tablet    Sig: Take 1 tablet by mouth every 6 (six) hours as needed for severe pain.    Dispense:  120 tablet    Refill:  0    Do not place this medication, or any other prescription from our practice, on "Automatic Refill". Patient may have prescription filled one day early if pharmacy is closed on scheduled refill date. Do not fill until: 08/11/16 To last until: 09/10/2016    Order Specific Question:   Supervising Provider    Answer:   Milinda Pointer [phone]  . HYDROcodone-acetaminophen (NORCO/VICODIN) 5-325 MG tablet    Sig: Take 1 tablet by mouth every 6 (six) hours as needed for severe pain.    Dispense:  120 tablet    Refill:  0    Do not place this medication, or any other prescription from our practice, on "Automatic Refill". Patient may have prescription filled one day early if pharmacy is closed on scheduled refill date. Do not fill until: 09/10/2016 To last until: 10/10/2016    Order Specific Question:   Supervising Provider    Answer:   Milinda Pointer [phone]  . HYDROcodone-acetaminophen (NORCO/VICODIN) 5-325 MG tablet    Sig: Take 1 tablet by mouth every 6 (six) hours as needed for severe pain.    Dispense:  120 tablet    Refill:  0    Do not place this medication, or any other prescription from our practice, on "Automatic Refill". Patient may have prescription filled one day early if pharmacy is closed on scheduled refill date. Do not fill until: 10/10/2016 To last until: 11/10/2016    Order Specific Question:   Supervising Provider    Answer:   Milinda Pointer [phone]  . morphine (MS CONTIN) 30 MG 12 hr tablet    Sig: Take 1 tablet (30  mg total) by mouth every 8 (eight) hours as needed for pain.    Dispense:  90 tablet    Refill:  0    Do not place this medication, or any other prescription from our practice, on "Automatic Refill". Patient may have prescription filled one day early if pharmacy is closed on scheduled refill date. Do not fill until: 08/11/16 To last until: 09/10/2016    Order Specific Question:   Supervising Provider    Answer:   Milinda Pointer [phone]  . morphine (MS CONTIN) 30 MG 12 hr tablet    Sig: Take 1 tablet (30 mg total) by mouth every 8 (eight) hours as needed for pain.    Dispense:  90 tablet    Refill:  0    Do not place this medication, or any other prescription from our practice, on "Automatic Refill". Patient may have prescription filled one day early if pharmacy is closed on scheduled refill date. Do not fill until: 09/10/2016 To last until: 10/10/2016    Order Specific Question:   Supervising Provider    Answer:   Milinda Pointer [phone]  . morphine (MS CONTIN) 30 MG 12 hr tablet    Sig: Take 1 tablet (30 mg total) by mouth every 8 (eight) hours as needed for pain.    Dispense:  90 tablet    Refill:  0    Do not place this medication, or any other prescription from our practice, on "Automatic Refill". Patient may have prescription filled one day early if pharmacy is closed on scheduled refill date.  Do not fill until:10/10/2016 To last until: 11/10/2016    Order Specific Question:   Supervising Provider    Answer:   Milinda Pointer [phone]   New Prescriptions   No medications on file   Medications administered today: Tiffany Herring had no medications administered during this visit. Lab-work, procedure(s), and/or referral(s): No orders of the defined types were placed in this encounter.  Imaging and/or referral(s): None  Interventional therapies: Planned, scheduled, and/or pending:   Not at this time.   Considering:   Not at this time.   Palliative PRN treatment(s):    Not at this time.   Provider-requested follow-up: Return in about 3 months (around 11/02/2016) for MedMgmt.  Future Appointments Date Time Provider [place]  11/01/2016 1:45 PM Vevelyn Francois, NP [facility] None   Primary Care Physician: Hortencia Pilar, MD Location: [facility] Outpatient Pain Management Facility Note by: Vevelyn Francois NP Date: 08/02/2016; Time: 2:56 PM  Pain Score Disclaimer: We use the NRS-11 scale. This is a self-reported, subjective measurement of pain severity with only modest accuracy. It is used primarily to identify changes within a particular patient. It must be understood that outpatient pain scales are significantly less accurate that those used for research, where they can be applied under ideal controlled circumstances with minimal exposure to variables. In reality, the score is likely to be a combination of pain intensity and pain affect, where pain affect describes the degree of emotional arousal or changes in action readiness caused by the sensory experience of pain. Factors such as social and work situation, setting, emotional state, anxiety levels, expectation, and prior pain experience may influence pain perception and show large inter-individual differences that may also be affected by time variables.  Patient instructions provided during this appointment: Patient Instructions   ____________________________________________________________________________________________  Medication Rules  Applies to: All patients receiving prescriptions (written or electronic).  Pharmacy of record: Pharmacy where electronic prescriptions will be sent. If written prescriptions are taken to a different pharmacy, please inform the nursing staff. The pharmacy listed in the electronic medical record should be the one where you would like electronic prescriptions to be sent.  Prescription refills: Only during scheduled appointments. Applies to both, written and  electronic prescriptions.  NOTE: The following applies primarily to controlled substances (Opioid* Pain Medications).   Patient's responsibilities: 1. Pain Pills: Bring all pain pills to every appointment (except for procedure appointments). 2. Pill Bottles: Bring pills in original pharmacy bottle. Always bring newest bottle. Bring bottle, even if empty. 3. Medication refills: You are responsible for knowing and keeping track of what medications you need refilled. The day before your appointment, write a list of all prescriptions that need to be refilled. Bring that list to your appointment and give it to the admitting nurse. Prescriptions will be written only during appointments. If you forget a medication, it will not be "Called in", "Faxed", or "electronically sent". You will need to get another appointment to get these prescribed. 4. Prescription Accuracy: You are responsible for carefully inspecting your prescriptions before leaving our office. Have the discharge nurse carefully go over each prescription with you, before taking them home. Make sure that your name is accurately spelled, that your address is correct. Check the name and dose of your medication to make sure it is accurate. Check the number of pills, and the written instructions to make sure they are clear and accurate. Make sure that you are given enough medication to last until your next medication refill appointment. 5.  Taking Medication: Take medication as prescribed. Never take more pills than instructed. Never take medication more frequently than prescribed. Taking less pills or less frequently is permitted and encouraged, when it comes to controlled substances (written prescriptions).  6. Inform other Doctors: Always inform, all of your healthcare providers, of all the medications you take. 7. Pain Medication from other Providers: You are not allowed to accept any additional pain medication from any other Doctor or Healthcare  provider. There are two exceptions to this rule. (see below) In the event that you require additional pain medication, you are responsible for notifying us, as stated below. 8. Medication Agreement: You are responsible for carefully reading and following our Medication Agreement. This must be signed before receiving any prescriptions from our practice. Safely store a copy of your signed Agreement. Violations to the Agreement will result in no further prescriptions. (Additional copies of our Medication Agreement are available upon request.) 9. Laws, Rules, & Regulations: All patients are expected to follow all Federal and [employer], [employer], Rules, [employer]. Ignorance of the Laws does not constitute a valid excuse. The use of any illegal substances is prohibited. 10. Adopted CDC guidelines & recommendations: Target dosing levels will be at or below 60 MME/day. Use of benzodiazepines** is not recommended.  Exceptions: There are only two exceptions to the rule of not receiving pain medications from other Healthcare Providers. 1. Exception #1 (Emergencies): In the event of an emergency (i.e.: accident requiring emergency care), you are allowed to receive additional pain medication. However, you are responsible for: As soon as you are able, call our office [phone], at any time of the day or night, and leave a message stating your name, the date and nature of the emergency, and the name and dose of the medication prescribed. In the event that your call is answered by a member of our staff, make sure to document and save the date, time, and the name of the person that took your information.  2. Exception #2 (Planned Surgery): In the event that you are scheduled by another doctor or dentist to have any type of surgery or procedure, you are allowed (for a period no longer than 30 days), to receive additional pain medication, for the acute post-op pain. However, in this case, you are responsible for  picking up a copy of our "Post-op Pain Management for Surgeons" handout, and giving it to your surgeon or dentist. This document is available at our office, and does not require an appointment to obtain it. Simply go to our office during business hours (Monday-Thursday from 8:00 AM to 4:00 PM) (Friday 8:00 AM to 12:00 Noon) or if you have a scheduled appointment with [place], prior to your surgery, and ask for it by name. In addition, you will need to provide [place] with your name, name of your surgeon, type of surgery, and date of procedure or surgery.  *Opioid medications include: morphine, codeine, oxycodone, oxymorphone, hydrocodone, hydromorphone, meperidine, tramadol, tapentadol, buprenorphine, fentanyl, methadone. **Benzodiazepine medications include: diazepam (Valium), alprazolam (Xanax), clonazepam (Klonopine), lorazepam (Ativan), clorazepate (Tranxene), chlordiazepoxide (Librium), estazolam (Prosom), oxazepam (Serax), temazepam (Restoril), triazolam (Halcion)  ____________________________________________________________________________________________

## 2016-08-02 NOTE — Progress Notes (Signed)
Nursing Pain Medication Assessment:  Safety precautions to be maintained throughout the outpatient stay will include: orient to surroundings, keep bed in low position, maintain call bell within reach at all times, provide assistance with transfer out of bed and ambulation.  Medication Inspection Compliance: Pill count conducted under aseptic conditions, in front of the patient. Neither the pills nor the bottle was removed from the patient's sight at any time. Once count was completed pills were immediately returned to the patient in their original bottle.  Medication #1: Hydrocodone/APAP Pill/Patch Count: 40 of 120 pills remain Pill/Patch Appearance: Markings consistent with prescribed medication Bottle Appearance: Standard pharmacy container. Clearly labeled. Filled Date:07 / 05 / 2018 Last Medication intake:  Today  Medication #2: Morphine ER (MSContin) Pill/Patch Count: 30 of 90 pills remain Pill/Patch Appearance: Markings consistent with prescribed medication Bottle Appearance: Standard pharmacy container. Clearly labeled. Filled Date: 07 / 05 / 2018 Last Medication intake:  Today

## 2016-08-24 DIAGNOSIS — N183 Chronic kidney disease, stage 3 (moderate): Secondary | ICD-10-CM | POA: Diagnosis not present

## 2016-08-24 DIAGNOSIS — E1165 Type 2 diabetes mellitus with hyperglycemia: Secondary | ICD-10-CM | POA: Diagnosis not present

## 2016-08-24 DIAGNOSIS — L03115 Cellulitis of right lower limb: Secondary | ICD-10-CM | POA: Diagnosis not present

## 2016-08-30 DIAGNOSIS — L03115 Cellulitis of right lower limb: Secondary | ICD-10-CM | POA: Diagnosis not present

## 2016-09-01 DIAGNOSIS — L03115 Cellulitis of right lower limb: Secondary | ICD-10-CM | POA: Diagnosis not present

## 2016-10-13 DIAGNOSIS — E119 Type 2 diabetes mellitus without complications: Secondary | ICD-10-CM | POA: Diagnosis not present

## 2016-10-13 DIAGNOSIS — Z23 Encounter for immunization: Secondary | ICD-10-CM | POA: Diagnosis not present

## 2016-10-13 DIAGNOSIS — Z794 Long term (current) use of insulin: Secondary | ICD-10-CM | POA: Diagnosis not present

## 2016-11-01 ENCOUNTER — Encounter: Payer: Self-pay | Admitting: Nurse Practitioner

## 2016-11-01 ENCOUNTER — Ambulatory Visit: Payer: Medicare HMO | Attending: Nurse Practitioner | Admitting: Nurse Practitioner

## 2016-11-01 VITALS — BP 137/86 | HR 82 | Temp 98.3°F | Resp 16 | Ht 62.0 in | Wt 305.0 lb

## 2016-11-01 DIAGNOSIS — Z880 Allergy status to penicillin: Secondary | ICD-10-CM | POA: Insufficient documentation

## 2016-11-01 DIAGNOSIS — R0989 Other specified symptoms and signs involving the circulatory and respiratory systems: Secondary | ICD-10-CM | POA: Insufficient documentation

## 2016-11-01 DIAGNOSIS — E538 Deficiency of other specified B group vitamins: Secondary | ICD-10-CM | POA: Diagnosis not present

## 2016-11-01 DIAGNOSIS — M25552 Pain in left hip: Secondary | ICD-10-CM | POA: Diagnosis present

## 2016-11-01 DIAGNOSIS — Z87891 Personal history of nicotine dependence: Secondary | ICD-10-CM | POA: Diagnosis not present

## 2016-11-01 DIAGNOSIS — Z818 Family history of other mental and behavioral disorders: Secondary | ICD-10-CM | POA: Insufficient documentation

## 2016-11-01 DIAGNOSIS — M1612 Unilateral primary osteoarthritis, left hip: Secondary | ICD-10-CM | POA: Insufficient documentation

## 2016-11-01 DIAGNOSIS — M1610 Unilateral primary osteoarthritis, unspecified hip: Secondary | ICD-10-CM | POA: Insufficient documentation

## 2016-11-01 DIAGNOSIS — Z888 Allergy status to other drugs, medicaments and biological substances status: Secondary | ICD-10-CM | POA: Insufficient documentation

## 2016-11-01 DIAGNOSIS — K219 Gastro-esophageal reflux disease without esophagitis: Secondary | ICD-10-CM | POA: Insufficient documentation

## 2016-11-01 DIAGNOSIS — M533 Sacrococcygeal disorders, not elsewhere classified: Secondary | ICD-10-CM

## 2016-11-01 DIAGNOSIS — Z7982 Long term (current) use of aspirin: Secondary | ICD-10-CM | POA: Insufficient documentation

## 2016-11-01 DIAGNOSIS — G894 Chronic pain syndrome: Secondary | ICD-10-CM | POA: Diagnosis not present

## 2016-11-01 DIAGNOSIS — J45909 Unspecified asthma, uncomplicated: Secondary | ICD-10-CM | POA: Insufficient documentation

## 2016-11-01 DIAGNOSIS — Z86010 Personal history of colonic polyps: Secondary | ICD-10-CM | POA: Insufficient documentation

## 2016-11-01 DIAGNOSIS — E1122 Type 2 diabetes mellitus with diabetic chronic kidney disease: Secondary | ICD-10-CM | POA: Insufficient documentation

## 2016-11-01 DIAGNOSIS — Z79899 Other long term (current) drug therapy: Secondary | ICD-10-CM | POA: Insufficient documentation

## 2016-11-01 DIAGNOSIS — G8929 Other chronic pain: Secondary | ICD-10-CM

## 2016-11-01 DIAGNOSIS — Z6841 Body Mass Index (BMI) 40.0 and over, adult: Secondary | ICD-10-CM | POA: Diagnosis not present

## 2016-11-01 DIAGNOSIS — Z823 Family history of stroke: Secondary | ICD-10-CM | POA: Insufficient documentation

## 2016-11-01 DIAGNOSIS — R109 Unspecified abdominal pain: Secondary | ICD-10-CM | POA: Insufficient documentation

## 2016-11-01 DIAGNOSIS — M47816 Spondylosis without myelopathy or radiculopathy, lumbar region: Secondary | ICD-10-CM | POA: Insufficient documentation

## 2016-11-01 DIAGNOSIS — M4316 Spondylolisthesis, lumbar region: Secondary | ICD-10-CM | POA: Insufficient documentation

## 2016-11-01 DIAGNOSIS — G43909 Migraine, unspecified, not intractable, without status migrainosus: Secondary | ICD-10-CM | POA: Diagnosis not present

## 2016-11-01 DIAGNOSIS — M797 Fibromyalgia: Secondary | ICD-10-CM | POA: Insufficient documentation

## 2016-11-01 DIAGNOSIS — Z79891 Long term (current) use of opiate analgesic: Secondary | ICD-10-CM | POA: Diagnosis not present

## 2016-11-01 DIAGNOSIS — I129 Hypertensive chronic kidney disease with stage 1 through stage 4 chronic kidney disease, or unspecified chronic kidney disease: Secondary | ICD-10-CM | POA: Diagnosis not present

## 2016-11-01 DIAGNOSIS — G4730 Sleep apnea, unspecified: Secondary | ICD-10-CM | POA: Diagnosis not present

## 2016-11-01 DIAGNOSIS — R002 Palpitations: Secondary | ICD-10-CM | POA: Insufficient documentation

## 2016-11-01 DIAGNOSIS — E7800 Pure hypercholesterolemia, unspecified: Secondary | ICD-10-CM | POA: Insufficient documentation

## 2016-11-01 DIAGNOSIS — M5441 Lumbago with sciatica, right side: Secondary | ICD-10-CM

## 2016-11-01 DIAGNOSIS — G35 Multiple sclerosis: Secondary | ICD-10-CM | POA: Diagnosis not present

## 2016-11-01 DIAGNOSIS — Z794 Long term (current) use of insulin: Secondary | ICD-10-CM | POA: Insufficient documentation

## 2016-11-01 DIAGNOSIS — Z5181 Encounter for therapeutic drug level monitoring: Secondary | ICD-10-CM | POA: Diagnosis not present

## 2016-11-01 DIAGNOSIS — N959 Unspecified menopausal and perimenopausal disorder: Secondary | ICD-10-CM | POA: Insufficient documentation

## 2016-11-01 DIAGNOSIS — Z882 Allergy status to sulfonamides status: Secondary | ICD-10-CM | POA: Insufficient documentation

## 2016-11-01 DIAGNOSIS — N183 Chronic kidney disease, stage 3 (moderate): Secondary | ICD-10-CM | POA: Insufficient documentation

## 2016-11-01 DIAGNOSIS — Z833 Family history of diabetes mellitus: Secondary | ICD-10-CM | POA: Insufficient documentation

## 2016-11-01 MED ORDER — MORPHINE SULFATE ER 30 MG PO TBCR: 30.0000 mg | EXTENDED_RELEASE_TABLET | Freq: Three times a day (TID) | ORAL | 0 refills | Status: DC | PRN

## 2016-11-01 MED ORDER — HYDROCODONE-ACETAMINOPHEN 5-325 MG PO TABS: 1.0000 | ORAL_TABLET | Freq: Four times a day (QID) | ORAL | 0 refills | Status: DC | PRN

## 2016-11-01 NOTE — Progress Notes (Signed)
Nursing Pain Medication Assessment:  Safety precautions to be maintained throughout the outpatient stay will include: orient to surroundings, keep bed in low position, maintain call bell within reach at all times, provide assistance with transfer out of bed and ambulation.  Medication Inspection Compliance: Pill count conducted under aseptic conditions, in front of the patient. Neither the pills nor the bottle was removed from the patient's sight at any time. Once count was completed pills were immediately returned to the patient in their original bottle.  Medication #1: Hydrocodone/APAP Pill/Patch Count: 39 of 120 pills remain Pill/Patch Appearance: Markings consistent with prescribed medication Bottle Appearance: Standard pharmacy container. Clearly labeled. Filled Date: 10 / 03 / 2018 Last Medication intake:  Today  Medication #2: Morphine ER (MSContin) Pill/Patch Count: 29 of 90 pills remain Pill/Patch Appearance: Markings consistent with prescribed medication Bottle Appearance: Standard pharmacy container. Clearly labeled. Filled Date: 10 / 03 / 2018 Last Medication intake:  Today

## 2016-11-01 NOTE — Patient Instructions (Signed)

## 2016-11-01 NOTE — Progress Notes (Signed)
Patient's Name: Tiffany Herring  MRN: [id number]  Referring Provider: Hortencia Pilar, MD  DOB: [birth year]  PCP: Hortencia Pilar, MD  DOS: 11/01/2016  Note by: Vevelyn Francois NP  Service setting: Ambulatory outpatient  Specialty: Interventional Pain Management  Location: [facility] (AMB) Pain Management Facility    Patient type: Established    Primary Reason(s) for Visit: Encounter for prescription drug management. (Level of risk: moderate)  CC: Hip Pain (left) and Hand Pain (right thumg)  HPI  Tiffany Herring is a 56 y.o. year old, female patient, who comes today for a medication management evaluation. She has Failed back surgical syndrome; Epidural fibrosis; Chronic bilateral low back pain with right-sided sciatica; Chronic lumbar radicular pain (Right); Long term current use of opiate analgesic; Long term prescription opiate use; Opiate use (80 MME/Day); Opiate dependence ([place]); Encounter for therapeutic drug level monitoring; Allergic rhinitis; Carotid artery bruit; Chronic kidney disease (CKD), stage III (moderate) ([place]); Colon polyp; Diabetes mellitus ([place]); Dysthymia; Essential (primary) hypertension; Generalized OA; Acid reflux; History of intermenstrual bleeding; History of migraine headaches; H/O nutritional disorder; Hypercholesterolemia; Type 2 diabetes mellitus treated with insulin ([place]); Other long term (current) drug therapy; Menopausal and perimenopausal disorder; Class III Morbid obesity ([place]) (254% higher incidence of chronic low back pain); Multiple sclerosis ([place]); Obstructive apnea; Degenerative arthritis of hip; Awareness of heartbeats; History of colon polyps; Post menopausal syndrome; Pure hypercholesterolemia; Enlargement of spleen; B12 deficiency; Fibromyalgia; Lumbar spondylosis; Grade 1 Anterolisthesis of L4 over L5; Chronic sacroiliac joint pain (bilateral); Chronic hip pain (Left); Arthropathy of left hip (Severe chronic left hip DJD); Osteoarthritis of hip (Left); Lumbar facet  arthropathy ([place]); Lumbar facet hypertrophy; Lumbar facet syndrome; Chronic flank pain (Right); Chronic lower extremity pain (Right); Breast cancer screening; and Chronic pain syndrome on her problem list. Her primarily concern today is the Hip Pain (left) and Hand Pain (right thumg)  Pain Assessment: Location: Left Hip Radiating: down to left knee Onset: More than a month ago Duration: Chronic pain Quality: Constant, Sharp, Aching, Burning Severity: 5 /10 (self-reported pain score)  Note: Reported level is compatible with observation. Clinically the patient looks like a 0/10 Tiffany Herring does not seem to understand the use of our objective pain scale  Effect on ADL: patient has to use wheelchair fulltime. able to transfer or pivot to potty Timing: Constant Modifying factors: pain medication  Tiffany Herring was last scheduled for an appointment on 57/25/2018 for medication management. During today's appointment we reviewed Tiffany Herring's chronic pain status, as well as her outpatient medication regimen. She admits that her pain is stable. She denies any new concerns. She denies any side effects of her medications.   The patient  reports that she does not use drugs. Her body mass index is 55.79 kg/m.  Further details on both, my assessment(s), as well as the proposed treatment plan, please see below.  Controlled Substance Pharmacotherapy Assessment REMS (Risk Evaluation and Mitigation Strategy)  Analgesic:Morphine ER 30 mg every 8 hours (90 mg/day) + hydrocodone/APAP 5/325 one every 6 hours (20 mg/dayof hydrocodone) MME/day:123m/day.  PJanett Billow RN  11/01/2016  2:40 PM  Sign at close encounter Nursing Pain Medication Assessment:  Safety precautions to be maintained throughout the outpatient stay will include: orient to surroundings, keep bed in low position, maintain call bell within reach at all times, provide assistance with transfer out of bed and ambulation.  Medication  Inspection Compliance: Pill count conducted under aseptic conditions, in front of the patient. Neither the pills nor the bottle  was removed from the patient's sight at any time. Once count was completed pills were immediately returned to the patient in their original bottle.  Medication #1: Hydrocodone/APAP Pill/Patch Count: 39 of 120 pills remain Pill/Patch Appearance: Markings consistent with prescribed medication Bottle Appearance: Standard pharmacy container. Clearly labeled. Filled Date: 10 / 03 / 2018 Last Medication intake:  Today  Medication #2: Morphine ER (MSContin) Pill/Patch Count: 29 of 90 pills remain Pill/Patch Appearance: Markings consistent with prescribed medication Bottle Appearance: Standard pharmacy container. Clearly labeled. Filled Date: 10 / 03 / 2018 Last Medication intake:  Today   Pharmacokinetics: Liberation and absorption (onset of action): WNL Distribution (time to peak effect): WNL Metabolism and excretion (duration of action): WNL         Pharmacodynamics: Desired effects: Analgesia: Tiffany Herring reports >50% benefit. Functional ability: Patient reports that medication allows her to accomplish basic ADLs Clinically meaningful improvement in function (CMIF): Sustained CMIF goals met Perceived effectiveness: Described as relatively effective, allowing for increase in activities of daily living (ADL) Undesirable effects: Side-effects or Adverse reactions: None reported Monitoring: [place] PMP: Online review of the past 26-monthperiod conducted. Compliant with practice rules and regulations Last UDS on record: Summary  Date Value Ref Range Status  05/04/2016 FINAL  Final    Comment:    ==================================================================== TOXASSURE SELECT 13 (MW) ==================================================================== Test                             Result       Flag       Units Drug Present and Declared for Prescription  Verification   Morphine                       >12500       EXPECTED   ng/mg creat    Potential sources of large amounts of morphine in the absence of    codeine include administration of morphine or use of heroin.   Hydrocodone                    824          EXPECTED   ng/mg creat   Hydromorphone                  195          EXPECTED   ng/mg creat   Norhydrocodone                 920          EXPECTED   ng/mg creat    Sources of hydrocodone include scheduled prescription    medications. Hydromorphone and norhydrocodone are expected    metabolites of hydrocodone. Hydromorphone is also available as a    scheduled prescription medication. ==================================================================== Test                      Result    Flag   Units      Ref Range   Creatinine              80               mg/dL      >=20 ==================================================================== Declared Medications:  The flagging and interpretation on this report are based on the  following declared medications.  Unexpected results may arise from  inaccuracies in the declared medications.  **Note: The testing scope  of this panel includes these medications:  Hydrocodone (Hydrocodone-Acetaminophen)  Morphine (MS Contin)  **Note: The testing scope of this panel does not include following  reported medications:  Acetaminophen (Hydrocodone-Acetaminophen)  Albuterol  Bupropion (Wellbutrin)  Cholecalciferol  Cyanocobalamin  Enalapril (Vasotec)  Furosemide (Lasix)  Gabapentin  Mirtazapine (Remeron)  Montelukast (Singulair)  Oxybutynin (Ditropan)  Tizanidine (Zanaflex)  Triamcinolone (Kenalog) ==================================================================== For clinical consultation, please call [phone]. ====================================================================    UDS interpretation: Compliant          Medication Assessment Form: Reviewed. Patient indicates  being compliant with therapy Treatment compliance: Compliant Risk Assessment Profile: Aberrant behavior: See prior evaluations. None observed or detected today Comorbid factors increasing risk of overdose: See prior notes. No additional risks detected today Risk of substance use disorder (SUD): Low     Opioid Risk Tool - 11/01/16 1435      Family History of Substance Abuse   Alcohol Negative   Illegal Drugs Negative   Rx Drugs Negative     Personal History of Substance Abuse   Alcohol Negative   Illegal Drugs Negative   Rx Drugs Negative     Psychological Disease   Psychological Disease Positive   ADD Negative   OCD Negative   Bipolar Negative   Schizophrenia Negative   Depression Positive     Total Score   Opioid Risk Tool Scoring 3   Opioid Risk Interpretation Low Risk     ORT Scoring interpretation table:  Score <3 = Low Risk for SUD  Score between 4-7 = Moderate Risk for SUD  Score >8 = High Risk for Opioid Abuse   Risk Mitigation Strategies:  Patient Counseling: Covered Patient-Prescriber Agreement (PPA): Present and active  Notification to other healthcare providers: Done  Pharmacologic Plan: No change in therapy, at this time  Laboratory Chemistry  Inflammation Markers (CRP: Acute Phase) (ESR: Chronic Phase) Lab Results  Component Value Date   CRP 2.0 (H) 02/15/2015   ESRSEDRATE 28 02/15/2015                 Renal Function Markers Lab Results  Component Value Date   BUN 27 (H) 02/15/2015   CREATININE 1.42 (H) 02/15/2015   GFRAA 47 (L) 02/15/2015   GFRNONAA 41 (L) 02/15/2015                 Hepatic Function Markers Lab Results  Component Value Date   AST 16 02/15/2015   ALT 19 02/15/2015   ALBUMIN 4.3 02/15/2015   ALKPHOS 77 02/15/2015                 Electrolytes Lab Results  Component Value Date   NA 138 02/15/2015   K 4.8 02/15/2015   CL 102 02/15/2015   CALCIUM 9.4 02/15/2015   MG 2.0 02/15/2015                 Neuropathy  Markers No results found for: [id number]               Bone Pathology Markers Lab Results  Component Value Date   ALKPHOS 77 02/15/2015   CALCIUM 9.4 02/15/2015                 Coagulation Parameters No results found for: INR, LABPROT, APTT, PLT               Cardiovascular Markers No results found for: BNP, HGB, HCT  Note: Lab results reviewed via care everywhere  Recent Diagnostic Imaging Results   Complexity Note: Imaging results reviewed. Results shared with Tiffany Herring, using [employer].                         Meds   Current Outpatient Prescriptions:  .  albuterol (PROAIR HFA) 108 (90 Base) MCG/ACT inhaler, Inhale 1 puff into the lungs as needed. , Disp: , Rfl:  .  B-D INS SYR ULTRAFINE 1CC/30G 30G X 1/2" 1 ML MISC, , Disp: , Rfl:  .  Blood Glucose Monitoring Suppl (FIFTY50 GLUCOSE METER 2.0) w/Device KIT, Check sugars three times daily.  On insulin.  E11.9, Disp: , Rfl:  .  buPROPion (WELLBUTRIN SR) 150 MG 12 hr tablet, Take 150 mg by mouth 2 (two) times daily. , Disp: , Rfl:  .  Cholecalciferol (VITAMIN D3) 5000 units CAPS, Take 1 capsule by mouth daily. , Disp: , Rfl:  .  clotrimazole (MYCELEX) 10 MG troche, Take 1 tablet by mouth 5 (five) times daily as needed., Disp: , Rfl:  .  enalapril (VASOTEC) 20 MG tablet, Take 20 mg by mouth daily. , Disp: , Rfl:  .  furosemide (LASIX) 20 MG tablet, Take 20 mg by mouth daily. , Disp: , Rfl:  .  gabapentin (NEURONTIN) 600 MG tablet, 600 mg 3 (three) times daily. , Disp: , Rfl:  .  Glucose Blood (BLOOD GLUCOSE TEST STRIPS) STRP, 1 each by Other route as needed. Use as instructed, Disp: , Rfl:  .  glucose blood ([employer] TEST STRIPS) test strip, Check sugars four times daily.  On insulin.  E11.65, Disp: , Rfl:  .  [START ON 11/10/2016] HYDROcodone-acetaminophen (NORCO/VICODIN) 5-325 MG tablet, Take 1 tablet by mouth every 6 (six) hours as needed for severe pain., Disp: 120 tablet, Rfl: 0 .  insulin NPH-regular Human  (NOVOLIN 70/30) (70-30) 100 UNIT/ML injection, INJECT 74 UNITS SQ 30 MINUTES BEFORE BREAKFAST AND 74 UNITS 30 MINS BEFORE SUPPER, Disp: , Rfl:  .  insulin regular (NOVOLIN R,HUMULIN R) 100 units/mL injection, SSI: 151-200: 2 units, 201-250: 4 units, 251-300:6 units, 301-350:8 units, 351-400: 10 units > 400 call MD, Disp: , Rfl:  .  Lancets (ACCU-CHEK MULTICLIX) lancets, 1 each., Disp: , Rfl:  .  mirtazapine (REMERON) 15 MG tablet, Take 15 mg by mouth at bedtime. , Disp: , Rfl:  .  montelukast (SINGULAIR) 10 MG tablet, Take 10 mg by mouth daily. , Disp: , Rfl:  .  [START ON 11/10/2016] morphine (MS CONTIN) 30 MG 12 hr tablet, Take 1 tablet (30 mg total) by mouth every 8 (eight) hours as needed for pain., Disp: 90 tablet, Rfl: 0 .  oxybutynin (DITROPAN XL) 10 MG 24 hr tablet, Take 10 mg by mouth daily. , Disp: , Rfl:  .  tiZANidine (ZANAFLEX) 4 MG tablet, Take 4 mg by mouth every 6 (six) hours. , Disp: , Rfl:  .  triamcinolone cream (KENALOG) 0.5 %, Apply 1 application topically 2 (two) times daily. , Disp: , Rfl:  .  vitamin B-12 (CYANOCOBALAMIN) 1000 MCG tablet, 1,000 mcg. Take 1,000 mcg by mouth daily., Disp: , Rfl:  .  aspirin 81 MG chewable tablet, Chew 81 mg by mouth daily., Disp: , Rfl:  .  fluconazole (DIFLUCAN) 150 MG tablet, Take 150 mg by mouth 3 times/day as needed-between meals & bedtime., Disp: , Rfl:  .  [START ON 12/10/2016] HYDROcodone-acetaminophen (NORCO/VICODIN) 5-325 MG tablet, Take 1 tablet  by mouth every 6 (six) hours as needed for severe pain., Disp: 120 tablet, Rfl: 0 .  [START ON 01/09/2017] HYDROcodone-acetaminophen (NORCO/VICODIN) 5-325 MG tablet, Take 1 tablet by mouth every 6 (six) hours as needed for severe pain., Disp: 120 tablet, Rfl: 0 .  [START ON 12/10/2016] morphine (MS CONTIN) 30 MG 12 hr tablet, Take 1 tablet (30 mg total) by mouth every 8 (eight) hours as needed for pain., Disp: 90 tablet, Rfl: 0 .  [START ON 01/09/2017] morphine (MS CONTIN) 30 MG 12 hr tablet, Take 1  tablet (30 mg total) by mouth every 8 (eight) hours as needed for pain., Disp: 90 tablet, Rfl: 0  ROS  Constitutional: Denies any fever or chills Gastrointestinal: No reported hemesis, hematochezia, vomiting, or acute GI distress Musculoskeletal: Denies any acute onset joint swelling, redness, loss of ROM, or weakness Neurological: No reported episodes of acute onset apraxia, aphasia, dysarthria, agnosia, amnesia, paralysis, loss of coordination, or loss of consciousness  Allergies  Tiffany Herring is allergic to augmentin [amoxicillin-pot clavulanate]; sulfa antibiotics; and mobic [meloxicam].  [place]  Drug: Tiffany Herring  reports that she does not use drugs. Alcohol:  reports that she does not drink alcohol. Tobacco:  reports that she has quit smoking. She has never used smokeless tobacco. Medical:  has a past medical history of Allergy; Arthritis; Asthma; Chronic kidney disease; Depression; Diabetes mellitus without complication ([place]); Hypertension; Multiple sclerosis ([place]); Neuromuscular disorder ([place]); and Sleep apnea. Surgical: Tiffany Herring  has a past surgical history that includes Cesarean section (1991 and 1996). Family: family history includes Dementia in her mother; Depression in her mother; Diabetes in her mother; Hyperlipidemia in her mother; Stroke in her father.  Constitutional Exam  General appearance: alert, cooperative, oriented, in no distress and morbidly obese Vitals:   11/01/16 1426  BP: 137/86  Pulse: 82  Resp: 16  Temp: 98.3 F (36.8 C)  TempSrc: Oral  SpO2: 96%  Weight: (!) 305 lb (138.3 kg)  Height: 5' 2"  (1.575 m)   BMI Assessment: Estimated body mass index is 55.79 kg/m as calculated from the following:   Height as of this encounter: 5' 2"  (1.575 m).   Weight as of this encounter: 305 lb (138.3 kg).  BMI interpretation table: BMI level Category Range association with higher incidence of chronic pain  <18 kg/m2 Underweight   18.5-24.9 kg/m2 Ideal body  weight   25-29.9 kg/m2 Overweight Increased incidence by 20%  30-34.9 kg/m2 Obese (Class I) Increased incidence by 68%  35-39.9 kg/m2 Severe obesity (Class II) Increased incidence by 136%  >40 kg/m2 Extreme obesity (Class III) Increased incidence by 254%   BMI Readings from Last 4 Encounters:  11/01/16 55.79 kg/m  08/02/16 57.61 kg/m  05/04/16 56.33 kg/m  02/03/16 56.15 kg/m   Wt Readings from Last 4 Encounters:  11/01/16 (!) 305 lb (138.3 kg)  08/02/16 (!) 315 lb (142.9 kg)  05/04/16 (!) 308 lb (139.7 kg)  02/03/16 (!) 307 lb (139.3 kg)  Psych/Mental status: Alert, oriented x 3 (person, place, & time)       Eyes: PERLA Respiratory: No evidence of acute respiratory distress  Cervical Spine Area Exam  Skin & Axial Inspection: No masses, redness, edema, swelling, or associated skin lesions Alignment: Symmetrical Functional ROM: Unrestricted ROM      Stability: No instability detected Muscle Tone/Strength: Functionally intact. No obvious neuro-muscular anomalies detected. Sensory (Neurological): Unimpaired Palpation: No palpable anomalies              Upper Extremity (  UE) Exam    Side: Right upper extremity  Side: Left upper extremity  Skin & Extremity Inspection: Skin color, temperature, and hair growth are WNL. No peripheral edema or cyanosis. No masses, redness, swelling, asymmetry, or associated skin lesions. No contractures.  Skin & Extremity Inspection: Skin color, temperature, and hair growth are WNL. No peripheral edema or cyanosis. No masses, redness, swelling, asymmetry, or associated skin lesions. No contractures.  Functional ROM: Unrestricted ROM          Functional ROM: Unrestricted ROM          Muscle Tone/Strength: Functionally intact. No obvious neuro-muscular anomalies detected.  Muscle Tone/Strength: Functionally intact. No obvious neuro-muscular anomalies detected.  Sensory (Neurological): Unimpaired          Sensory (Neurological): Unimpaired           Palpation: No palpable anomalies              Palpation: No palpable anomalies              Specialized Test(s): Deferred         Specialized Test(s): Deferred          Thoracic Spine Area Exam  Skin & Axial Inspection: No masses, redness, or swelling Alignment: Symmetrical Functional ROM: Unrestricted ROM Stability: No instability detected Muscle Tone/Strength: Functionally intact. No obvious neuro-muscular anomalies detected. Sensory (Neurological): Unimpaired Muscle strength & Tone: No palpable anomalies  Lumbar Spine Area Exam  Skin & Axial Inspection: No masses, redness, or swelling Alignment: Symmetrical Functional ROM: Unrestricted ROM      Stability: No instability detected Muscle Tone/Strength: Functionally intact. No obvious neuro-muscular anomalies detected. Sensory (Neurological): Unimpaired Palpation: No palpable anomalies       Provocative Tests: Lumbar Hyperextension and rotation test: evaluation deferred today       Lumbar Lateral bending test: evaluation deferred today       Patrick's Maneuver: evaluation deferred today                    Gait & Posture Assessment  Ambulation: Patient ambulates using a wheel chair Gait: Very limited, using assistive device to ambulate Posture: WNL   Lower Extremity Exam    Side: Right lower extremity  Side: Left lower extremity  Skin & Extremity Inspection: Skin color, temperature, and hair growth are WNL. No peripheral edema or cyanosis. No masses, redness, swelling, asymmetry, or associated skin lesions. No contractures.  Skin & Extremity Inspection: Skin color, temperature, and hair growth are WNL. No peripheral edema or cyanosis. No masses, redness, swelling, asymmetry, or associated skin lesions. No contractures.  Functional ROM: Unrestricted ROM          Functional ROM: Unrestricted ROM          Muscle Tone/Strength: Functionally intact. No obvious neuro-muscular anomalies detected.  Muscle Tone/Strength: Functionally  intact. No obvious neuro-muscular anomalies detected.  Sensory (Neurological): Unimpaired  Sensory (Neurological): Unimpaired  Palpation: No palpable anomalies  Palpation: No palpable anomalies   Assessment  Primary Diagnosis & Pertinent Problem List: The primary encounter diagnosis was Chronic hip pain (Left). Diagnoses of Chronic bilateral low back pain with right-sided sciatica, Chronic sacroiliac joint pain (bilateral), Multiple sclerosis ([place]), and Chronic pain syndrome were also pertinent to this visit.  Status Diagnosis  Controlled Controlled Controlled 1. Chronic hip pain (Left)   2. Chronic bilateral low back pain with right-sided sciatica   3. Chronic sacroiliac joint pain (bilateral)   4. Multiple sclerosis ([place])   5. Chronic pain  syndrome     Problems updated and reviewed during this visit: No problems updated. Plan of Care  Pharmacotherapy (Medications Ordered): Meds ordered this encounter  Medications  . HYDROcodone-acetaminophen (NORCO/VICODIN) 5-325 MG tablet    Sig: Take 1 tablet by mouth every 6 (six) hours as needed for severe pain.    Dispense:  120 tablet    Refill:  0    Do not place this medication, or any other prescription from our practice, on "Automatic Refill". Patient may have prescription filled one day early if pharmacy is closed on scheduled refill date. Do not fill until: 11/10/2016 To last until: 12/10/2016    Order Specific Question:   Supervising Provider    Answer:   Milinda Pointer [phone]  . HYDROcodone-acetaminophen (NORCO/VICODIN) 5-325 MG tablet    Sig: Take 1 tablet by mouth every 6 (six) hours as needed for severe pain.    Dispense:  120 tablet    Refill:  0    Do not place this medication, or any other prescription from our practice, on "Automatic Refill". Patient may have prescription filled one day early if pharmacy is closed on scheduled refill date. Do not fill until:12/10/2016 To last until: 01/09/2017    Order Specific Question:    Supervising Provider    Answer:   Milinda Pointer [phone]  . HYDROcodone-acetaminophen (NORCO/VICODIN) 5-325 MG tablet    Sig: Take 1 tablet by mouth every 6 (six) hours as needed for severe pain.    Dispense:  120 tablet    Refill:  0    Do not place this medication, or any other prescription from our practice, on "Automatic Refill". Patient may have prescription filled one day early if pharmacy is closed on scheduled refill date. Do not fill until: 01/09/2017 To last until: 02/08/2017    Order Specific Question:   Supervising Provider    Answer:   Milinda Pointer [phone]  . morphine (MS CONTIN) 30 MG 12 hr tablet    Sig: Take 1 tablet (30 mg total) by mouth every 8 (eight) hours as needed for pain.    Dispense:  90 tablet    Refill:  0    Do not place this medication, or any other prescription from our practice, on "Automatic Refill". Patient may have prescription filled one day early if pharmacy is closed on scheduled refill date. Do not fill until:11/10/2016 To last until: 12/10/2016    Order Specific Question:   Supervising Provider    Answer:   Milinda Pointer [phone]  . morphine (MS CONTIN) 30 MG 12 hr tablet    Sig: Take 1 tablet (30 mg total) by mouth every 8 (eight) hours as needed for pain.    Dispense:  90 tablet    Refill:  0    Do not place this medication, or any other prescription from our practice, on "Automatic Refill". Patient may have prescription filled one day early if pharmacy is closed on scheduled refill date. Do not fill until: 12/10/2016 To last until: 01/09/2017    Order Specific Question:   Supervising Provider    Answer:   Milinda Pointer [phone]  . morphine (MS CONTIN) 30 MG 12 hr tablet    Sig: Take 1 tablet (30 mg total) by mouth every 8 (eight) hours as needed for pain.    Dispense:  90 tablet    Refill:  0    Do not place this medication, or any other prescription from our practice, on "Automatic Refill". Patient may  have prescription  filled one day early if pharmacy is closed on scheduled refill date. Do not fill until: 01/09/2017 To last until: 02/08/2017    Order Specific Question:   Supervising Provider    Answer:   Milinda Pointer [phone]   New Prescriptions   No medications on file   Medications administered today: Tiffany Herring had no medications administered during this visit. Lab-work, procedure(s), and/or referral(s): No orders of the defined types were placed in this encounter.  Imaging and/or referral(s): None  Interventional therapies: Planned, scheduled, and/or pending:   Not at this time.   Considering:   Not at this time.   Palliative PRN treatment(s):   Not at this time.   Provider-requested follow-up: Return in about 3 months (around 02/01/2017) for MedMgmt.  Future Appointments Date Time Provider [place]  02/01/2017 12:45 PM Vevelyn Francois, NP [facility] None   Primary Care Physician: Hortencia Pilar, MD Location: [facility] Note by: Vevelyn Francois NP Date: 11/01/2016; Time: 9:52 AM  Pain Score Disclaimer: We use the NRS-11 scale. This is a self-reported, subjective measurement of pain severity with only modest accuracy. It is used primarily to identify changes within a particular patient. It must be understood that outpatient pain scales are significantly less accurate that those used for research, where they can be applied under ideal controlled circumstances with minimal exposure to variables. In reality, the score is likely to be a combination of pain intensity and pain affect, where pain affect describes the degree of emotional arousal or changes in action readiness caused by the sensory experience of pain. Factors such as social and work situation, setting, emotional state, anxiety levels, expectation, and prior pain experience may influence pain perception and show large inter-individual differences that may also be affected by time  variables.  Patient instructions provided during this appointment: Patient Instructions   ____________________________________________________________________________________________  Medication Rules  Applies to: All patients receiving prescriptions (written or electronic).  Pharmacy of record: Pharmacy where electronic prescriptions will be sent. If written prescriptions are taken to a different pharmacy, please inform the nursing staff. The pharmacy listed in the electronic medical record should be the one where you would like electronic prescriptions to be sent.  Prescription refills: Only during scheduled appointments. Applies to both, written and electronic prescriptions.  NOTE: The following applies primarily to controlled substances (Opioid* Pain Medications).   Patient's responsibilities: 1. Pain Pills: Bring all pain pills to every appointment (except for procedure appointments). 2. Pill Bottles: Bring pills in original pharmacy bottle. Always bring newest bottle. Bring bottle, even if empty. 3. Medication refills: You are responsible for knowing and keeping track of what medications you need refilled. The day before your appointment, write a list of all prescriptions that need to be refilled. Bring that list to your appointment and give it to the admitting nurse. Prescriptions will be written only during appointments. If you forget a medication, it will not be "Called in", "Faxed", or "electronically sent". You will need to get another appointment to get these prescribed. 4. Prescription Accuracy: You are responsible for carefully inspecting your prescriptions before leaving our office. Have the discharge nurse carefully go over each prescription with you, before taking them home. Make sure that your name is accurately spelled, that your address is correct. Check the name and dose of your medication to make sure it is accurate. Check the number of pills, and the written instructions  to make sure they are clear and accurate. Make  sure that you are given enough medication to last until your next medication refill appointment. 5. Taking Medication: Take medication as prescribed. Never take more pills than instructed. Never take medication more frequently than prescribed. Taking less pills or less frequently is permitted and encouraged, when it comes to controlled substances (written prescriptions).  6. Inform other Doctors: Always inform, all of your healthcare providers, of all the medications you take. 7. Pain Medication from other Providers: You are not allowed to accept any additional pain medication from any other Doctor or Healthcare provider. There are two exceptions to this rule. (see below) In the event that you require additional pain medication, you are responsible for notifying us, as stated below. 8. Medication Agreement: You are responsible for carefully reading and following our Medication Agreement. This must be signed before receiving any prescriptions from our practice. Safely store a copy of your signed Agreement. Violations to the Agreement will result in no further prescriptions. (Additional copies of our Medication Agreement are available upon request.) 9. Laws, Rules, & Regulations: All patients are expected to follow all Federal and [employer], Rules, [employer]. Ignorance of the Laws does not constitute a valid excuse. The use of any illegal substances is prohibited. 10. Adopted CDC guidelines & recommendations: Target dosing levels will be at or below 60 MME/day. Use of benzodiazepines** is not recommended.  Exceptions: There are only two exceptions to the rule of not receiving pain medications from other Healthcare Providers. 1. Exception #1 (Emergencies): In the event of an emergency (i.e.: accident requiring emergency care), you are allowed to receive additional pain medication. However, you are responsible for: As soon as you are able, call our  office [phone], at any time of the day or night, and leave a message stating your name, the date and nature of the emergency, and the name and dose of the medication prescribed. In the event that your call is answered by a member of our staff, make sure to document and save the date, time, and the name of the person that took your information.  2. Exception #2 (Planned Surgery): In the event that you are scheduled by another doctor or dentist to have any type of surgery or procedure, you are allowed (for a period no longer than 30 days), to receive additional pain medication, for the acute post-op pain. However, in this case, you are responsible for picking up a copy of our "Post-op Pain Management for Surgeons" handout, and giving it to your surgeon or dentist. This document is available at our office, and does not require an appointment to obtain it. Simply go to our office during business hours (Monday-Thursday from 8:00 AM to 4:00 PM) (Friday 8:00 AM to 12:00 Noon) or if you have a scheduled appointment with [place], prior to your surgery, and ask for it by name. In addition, you will need to provide [place] with your name, name of your surgeon, type of surgery, and date of procedure or surgery.  *Opioid medications include: morphine, codeine, oxycodone, oxymorphone, hydrocodone, hydromorphone, meperidine, tramadol, tapentadol, buprenorphine, fentanyl, methadone. **Benzodiazepine medications include: diazepam (Valium), alprazolam (Xanax), clonazepam (Klonopine), lorazepam (Ativan), clorazepate (Tranxene), chlordiazepoxide (Librium), estazolam (Prosom), oxazepam (Serax), temazepam (Restoril), triazolam (Halcion)  ____________________________________________________________________________________________

## 2016-12-15 DIAGNOSIS — R300 Dysuria: Secondary | ICD-10-CM | POA: Diagnosis not present

## 2017-01-19 DIAGNOSIS — Z6841 Body Mass Index (BMI) 40.0 and over, adult: Secondary | ICD-10-CM

## 2017-02-01 ENCOUNTER — Ambulatory Visit: Payer: BLUE CROSS/BLUE SHIELD | Attending: Nurse Practitioner | Admitting: Nurse Practitioner

## 2017-02-01 ENCOUNTER — Other Ambulatory Visit: Payer: Self-pay

## 2017-02-01 VITALS — BP 143/90 | HR 97 | Temp 98.5°F | Resp 18 | Ht 62.0 in | Wt 315.0 lb

## 2017-02-01 DIAGNOSIS — J45909 Unspecified asthma, uncomplicated: Secondary | ICD-10-CM | POA: Diagnosis not present

## 2017-02-01 DIAGNOSIS — M25552 Pain in left hip: Secondary | ICD-10-CM | POA: Diagnosis not present

## 2017-02-01 DIAGNOSIS — Z818 Family history of other mental and behavioral disorders: Secondary | ICD-10-CM | POA: Diagnosis not present

## 2017-02-01 DIAGNOSIS — Z87891 Personal history of nicotine dependence: Secondary | ICD-10-CM | POA: Diagnosis not present

## 2017-02-01 DIAGNOSIS — Z9889 Other specified postprocedural states: Secondary | ICD-10-CM | POA: Insufficient documentation

## 2017-02-01 DIAGNOSIS — I129 Hypertensive chronic kidney disease with stage 1 through stage 4 chronic kidney disease, or unspecified chronic kidney disease: Secondary | ICD-10-CM | POA: Insufficient documentation

## 2017-02-01 DIAGNOSIS — Z833 Family history of diabetes mellitus: Secondary | ICD-10-CM | POA: Insufficient documentation

## 2017-02-01 DIAGNOSIS — N189 Chronic kidney disease, unspecified: Secondary | ICD-10-CM | POA: Diagnosis not present

## 2017-02-01 DIAGNOSIS — G4730 Sleep apnea, unspecified: Secondary | ICD-10-CM | POA: Insufficient documentation

## 2017-02-01 DIAGNOSIS — M533 Sacrococcygeal disorders, not elsewhere classified: Secondary | ICD-10-CM | POA: Insufficient documentation

## 2017-02-01 DIAGNOSIS — M5416 Radiculopathy, lumbar region: Secondary | ICD-10-CM | POA: Insufficient documentation

## 2017-02-01 DIAGNOSIS — Z823 Family history of stroke: Secondary | ICD-10-CM | POA: Diagnosis not present

## 2017-02-01 DIAGNOSIS — Z7982 Long term (current) use of aspirin: Secondary | ICD-10-CM | POA: Insufficient documentation

## 2017-02-01 DIAGNOSIS — M1990 Unspecified osteoarthritis, unspecified site: Secondary | ICD-10-CM | POA: Insufficient documentation

## 2017-02-01 DIAGNOSIS — Z880 Allergy status to penicillin: Secondary | ICD-10-CM | POA: Diagnosis not present

## 2017-02-01 DIAGNOSIS — G8929 Other chronic pain: Secondary | ICD-10-CM

## 2017-02-01 DIAGNOSIS — E1122 Type 2 diabetes mellitus with diabetic chronic kidney disease: Secondary | ICD-10-CM | POA: Diagnosis not present

## 2017-02-01 DIAGNOSIS — G894 Chronic pain syndrome: Secondary | ICD-10-CM | POA: Diagnosis present

## 2017-02-01 DIAGNOSIS — Z794 Long term (current) use of insulin: Secondary | ICD-10-CM | POA: Insufficient documentation

## 2017-02-01 DIAGNOSIS — Z79899 Other long term (current) drug therapy: Secondary | ICD-10-CM | POA: Insufficient documentation

## 2017-02-01 DIAGNOSIS — G35 Multiple sclerosis: Secondary | ICD-10-CM | POA: Diagnosis not present

## 2017-02-01 DIAGNOSIS — Z820 Family history of epilepsy and other diseases of the nervous system: Secondary | ICD-10-CM | POA: Insufficient documentation

## 2017-02-01 DIAGNOSIS — Z888 Allergy status to other drugs, medicaments and biological substances status: Secondary | ICD-10-CM | POA: Insufficient documentation

## 2017-02-01 DIAGNOSIS — Z882 Allergy status to sulfonamides status: Secondary | ICD-10-CM | POA: Insufficient documentation

## 2017-02-01 DIAGNOSIS — M797 Fibromyalgia: Secondary | ICD-10-CM | POA: Diagnosis not present

## 2017-02-01 MED ORDER — MORPHINE SULFATE ER 30 MG PO TBCR: 30.0000 mg | EXTENDED_RELEASE_TABLET | Freq: Three times a day (TID) | ORAL | 0 refills | Status: DC | PRN

## 2017-02-01 MED ORDER — HYDROCODONE-ACETAMINOPHEN 5-325 MG PO TABS: 1.0000 | ORAL_TABLET | Freq: Four times a day (QID) | ORAL | 0 refills | Status: DC | PRN

## 2017-02-01 NOTE — Progress Notes (Signed)
Patient's Name: Tiffany Herring  MRN: [id number]  Referring Provider: Hortencia Pilar, MD  DOB: 27-Feb-1960  PCP: Hortencia Pilar, MD  DOS: 02/01/2017  Note by: Vevelyn Francois NP  Service setting: Ambulatory outpatient  Specialty: Interventional Pain Management  Location: [facility] (AMB) Pain Management Facility    Patient type: Established    Primary Reason(s) for Visit: Encounter for prescription drug management. (Level of risk: moderate)  CC: Shoulder Pain (right) and Hip Pain (left)  HPI  Ms. Prezioso is a 57 y.o. year old, female patient, who comes today for a medication management evaluation. She has Failed back surgical syndrome; Epidural fibrosis; Chronic bilateral low back pain with right-sided sciatica; Chronic lumbar radicular pain (Right); Long term current use of opiate analgesic; Long term prescription opiate use; Opiate use (80 MME/Day); Opiate dependence ([place]); Encounter for therapeutic drug level monitoring; Allergic rhinitis; Carotid artery bruit; Chronic kidney disease (CKD), stage III (moderate) ([place]); Colon polyp; Diabetes mellitus ([place]); Dysthymia; Essential (primary) hypertension; Generalized OA; Acid reflux; History of intermenstrual bleeding; History of migraine headaches; H/O nutritional disorder; Hypercholesterolemia; Type 2 diabetes mellitus treated with insulin ([place]); Other long term (current) drug therapy; Menopausal and perimenopausal disorder; Class III Morbid obesity ([place]) (254% higher incidence of chronic low back pain); Multiple sclerosis ([place]); Obstructive apnea; Degenerative arthritis of hip; Awareness of heartbeats; History of colon polyps; Post menopausal syndrome; Pure hypercholesterolemia; Enlargement of spleen; B12 deficiency; Fibromyalgia; Lumbar spondylosis; Grade 1 Anterolisthesis of L4 over L5; Chronic sacroiliac joint pain (bilateral); Chronic hip pain (Left); Arthropathy of left hip (Severe chronic left hip DJD); Osteoarthritis of hip (Left); Lumbar facet  arthropathy ([place]); Lumbar facet hypertrophy; Lumbar facet syndrome; Chronic flank pain (Right); Chronic lower extremity pain (Right); Breast cancer screening; and Chronic pain syndrome on their problem list. Her primarily concern today is the Shoulder Pain (right) and Hip Pain (left)  Pain Assessment: Location: Right Shoulder Radiating: upper arm to the elbow Onset: More than a month ago Duration: Chronic pain Quality: Constant, Aching, Stabbing Severity: 7 /10 (self-reported pain score)  Note: Reported level is compatible with observation. Clinically the patient looks like a 3/10 A 3/10 is viewed as "Moderate" and described as significantly interfering with activities of daily living (ADL). It becomes difficult to feed, bathe, get dressed, get on and off the toilet or to perform personal hygiene functions. Difficult to get in and out of bed or a chair without assistance. Very distracting. With effort, it can be ignored when deeply involved in activities. Information on the proper use of the pain scale provided to the patient today. When using our objective Pain Scale, levels between 6 and 10/10 are said to belong in an emergency room, as it progressively worsens from a 6/10, described as severely limiting, requiring emergency care not usually available at an outpatient pain management facility. At a 6/10 level, communication becomes difficult and requires great effort. Assistance to reach the emergency department may be required. Facial flushing and profuse sweating along with potentially dangerous increases in heart rate and blood pressure will be evident. Effect on ADL:   Timing: Constant Modifying factors: medications  Ms. Tingley was last scheduled for an appointment on 11/01/2016 for medication management. During today's appointment we reviewed Ms. Tomich's chronic pain status, as well as her outpatient medication regimen.  The patient  reports that she does not use drugs. Her body mass  index is 57.61 kg/m.  Further details on both, my assessment(s), as well as the proposed treatment plan, please see below.  Controlled Substance Pharmacotherapy Assessment REMS (Risk Evaluation and Mitigation Strategy)  Analgesic:Morphine ER 30 mg every 8 hours (90 mg/day) + hydrocodone/APAP 5/325 one every 6 hours (20 mg/dayof hydrocodone) MME/day:188m/day.    WLandis Martins RN  02/01/2017  1:01 PM  Sign at close encounter Nursing Pain Medication Assessment:  Safety precautions to be maintained throughout the outpatient stay will include: orient to surroundings, keep bed in low position, maintain call bell within reach at all times, provide assistance with transfer out of bed and ambulation.  Medication Inspection Compliance: Pill count conducted under aseptic conditions, in front of the patient. Neither the pills nor the bottle was removed from the patient's sight at any time. Once count was completed pills were immediately returned to the patient in their original bottle.  Medication #1: Hydrocodone/APAP Pill/Patch Count: 75 of 120 pills remain Pill/Patch Appearance: Markings consistent with prescribed medication Bottle Appearance: Standard pharmacy container. Clearly labeled. Filled Date: 01/17 / 2019 Last Medication intake:  Today  Medication #2: Morphine ER (MSContin) Pill/Patch Count: 77 of 90 pills remain Pill/Patch Appearance: Markings consistent with prescribed medication Bottle Appearance: Standard pharmacy container. Clearly labeled. Filled Date: 01/18 / 2019 Last Medication intake:  Today   Pharmacokinetics: Liberation and absorption (onset of action): WNL Distribution (time to peak effect): WNL Metabolism and excretion (duration of action): WNL         Pharmacodynamics: Desired effects: Analgesia: Ms. SRancourtreports >50% benefit. Functional ability: Patient reports that medication allows her to accomplish basic ADLs Clinically meaningful improvement in  function (CMIF): Sustained CMIF goals met Perceived effectiveness: Described as relatively effective, allowing for increase in activities of daily living (ADL) Undesirable effects: Side-effects or Adverse reactions: None reported Monitoring:  PMP: Online review of the past 187-montheriod conducted. Compliant with practice rules and regulations Last UDS on record: Summary  Date Value Ref Range Status  05/04/2016 FINAL  Final    Comment:    ==================================================================== TOXASSURE SELECT 13 (MW) ==================================================================== Test                             Result       Flag       Units Drug Present and Declared for Prescription Verification   Morphine                       >12500       EXPECTED   ng/mg creat    Potential sources of large amounts of morphine in the absence of    codeine include administration of morphine or use of heroin.   Hydrocodone                    824          EXPECTED   ng/mg creat   Hydromorphone                  195          EXPECTED   ng/mg creat   Norhydrocodone                 920          EXPECTED   ng/mg creat    Sources of hydrocodone include scheduled prescription    medications. Hydromorphone and norhydrocodone are expected    metabolites of hydrocodone. Hydromorphone is also available as a    scheduled prescription medication. ==================================================================== Test  Result    Flag   Units      Ref Range   Creatinine              80               mg/dL      >=20 ==================================================================== Declared Medications:  The flagging and interpretation on this report are based on the  following declared medications.  Unexpected results may arise from  inaccuracies in the declared medications.  **Note: The testing scope of this panel includes these medications:  Hydrocodone  (Hydrocodone-Acetaminophen)  Morphine (MS Contin)  **Note: The testing scope of this panel does not include following  reported medications:  Acetaminophen (Hydrocodone-Acetaminophen)  Albuterol  Bupropion (Wellbutrin)  Cholecalciferol  Cyanocobalamin  Enalapril (Vasotec)  Furosemide (Lasix)  Gabapentin  Mirtazapine (Remeron)  Montelukast (Singulair)  Oxybutynin (Ditropan)  Tizanidine (Zanaflex)  Triamcinolone (Kenalog) ==================================================================== For clinical consultation, please call [phone]. ====================================================================    UDS interpretation: Compliant          Medication Assessment Form: Reviewed. Patient indicates being compliant with therapy Treatment compliance: Compliant Risk Assessment Profile: Aberrant behavior: See prior evaluations. None observed or detected today Comorbid factors increasing risk of overdose: See prior notes. No additional risks detected today Risk of substance use disorder (SUD): Low  ORT Scoring interpretation table:  Score <3 = Low Risk for SUD  Score between 4-7 = Moderate Risk for SUD  Score >8 = High Risk for Opioid Abuse   Risk Mitigation Strategies:  Patient Counseling: Covered Patient-Prescriber Agreement (PPA): Present and active  Notification to other healthcare providers: Done  Pharmacologic Plan: No change in therapy, at this time.             Laboratory Chemistry  Inflammation Markers (CRP: Acute Phase) (ESR: Chronic Phase) Lab Results  Component Value Date   CRP 2.0 (H) 02/15/2015   ESRSEDRATE 28 02/15/2015                 Rheumatology Markers No results found for: RF, ANA, LABURIC, URICUR, LYMEIGGIGMAB, LYMEABIGMQN              Renal Function Markers Lab Results  Component Value Date   BUN 27 (H) 02/15/2015   CREATININE 1.42 (H) 02/15/2015   GFRAA 47 (L) 02/15/2015   GFRNONAA 41 (L) 02/15/2015                 Hepatic  Function Markers Lab Results  Component Value Date   AST 16 02/15/2015   ALT 19 02/15/2015   ALBUMIN 4.3 02/15/2015   ALKPHOS 77 02/15/2015                 Electrolytes Lab Results  Component Value Date   NA 138 02/15/2015   K 4.8 02/15/2015   CL 102 02/15/2015   CALCIUM 9.4 02/15/2015   MG 2.0 02/15/2015                 Neuropathy Markers No results found for: VITAMINB12, FOLATE, HGBA1C, HIV               Bone Pathology Markers No results found for: [place], [id number], [id number], [id number], 25OHVITD1, 25OHVITD2, 25OHVITD3, TESTOFREE, TESTOSTERONE               Coagulation Parameters No results found for: INR, LABPROT, APTT, PLT, DDIMER               Cardiovascular Markers No results found for: BNP, CKTOTAL, CKMB, TROPONINI,  HGB, HCT               CA Markers No results found for: CEA, CA125, LABCA2               Note: Lab results reviewed.  Recent Diagnostic Imaging Results   Complexity Note: Imaging results reviewed. Results shared with Ms. Minette Brine, using [employer].                         Meds   Current Outpatient Medications:  .  albuterol (PROAIR HFA) 108 (90 Base) MCG/ACT inhaler, Inhale 1 puff into the lungs as needed. , Disp: , Rfl:  .  aspirin 81 MG chewable tablet, Chew 81 mg by mouth daily., Disp: , Rfl:  .  B-D INS SYR ULTRAFINE 1CC/30G 30G X 1/2" 1 ML MISC, , Disp: , Rfl:  .  Blood Glucose Monitoring Suppl (FIFTY50 GLUCOSE METER 2.0) w/Device KIT, Check sugars three times daily.  On insulin.  E11.9, Disp: , Rfl:  .  buPROPion (WELLBUTRIN SR) 150 MG 12 hr tablet, Take 150 mg by mouth 2 (two) times daily. , Disp: , Rfl:  .  Cholecalciferol (VITAMIN D3) 5000 units CAPS, Take 1 capsule by mouth daily. , Disp: , Rfl:  .  clotrimazole (MYCELEX) 10 MG troche, Take 1 tablet by mouth 5 (five) times daily as needed., Disp: , Rfl:  .  furosemide (LASIX) 20 MG tablet, Take 20 mg by mouth daily. , Disp: , Rfl:  .  gabapentin (NEURONTIN) 600 MG tablet, 600 mg  3 (three) times daily. , Disp: , Rfl:  .  Glucose Blood (BLOOD GLUCOSE TEST STRIPS) STRP, 1 each by Other route as needed. Use as instructed, Disp: , Rfl:  .  glucose blood ([employer] TEST STRIPS) test strip, Check sugars four times daily.  On insulin.  E11.65, Disp: , Rfl:  .  [START ON 04/25/2017] HYDROcodone-acetaminophen (NORCO/VICODIN) 5-325 MG tablet, Take 1 tablet by mouth every 6 (six) hours as needed for severe pain., Disp: 120 tablet, Rfl: 0 .  insulin NPH-regular Human (NOVOLIN 70/30) (70-30) 100 UNIT/ML injection, INJECT 74 UNITS SQ 30 MINUTES BEFORE BREAKFAST AND 74 UNITS 30 MINS BEFORE SUPPER, Disp: , Rfl:  .  insulin regular (NOVOLIN R,HUMULIN R) 100 units/mL injection, SSI: 151-200: 2 units, 201-250: 4 units, 251-300:6 units, 301-350:8 units, 351-400: 10 units > 400 call MD, Disp: , Rfl:  .  Lancets (ACCU-CHEK MULTICLIX) lancets, 1 each., Disp: , Rfl:  .  mirtazapine (REMERON) 15 MG tablet, Take 15 mg by mouth at bedtime. , Disp: , Rfl:  .  montelukast (SINGULAIR) 10 MG tablet, Take 10 mg by mouth daily. , Disp: , Rfl:  .  [START ON 04/25/2017] morphine (MS CONTIN) 30 MG 12 hr tablet, Take 1 tablet (30 mg total) by mouth every 8 (eight) hours as needed for pain., Disp: 90 tablet, Rfl: 0 .  oxybutynin (DITROPAN XL) 10 MG 24 hr tablet, Take 10 mg by mouth daily. , Disp: , Rfl:  .  tiZANidine (ZANAFLEX) 4 MG tablet, Take 4 mg by mouth every 6 (six) hours. , Disp: , Rfl:  .  triamcinolone cream (KENALOG) 0.5 %, Apply 1 application topically 2 (two) times daily. , Disp: , Rfl:  .  vitamin B-12 (CYANOCOBALAMIN) 1000 MCG tablet, 1,000 mcg. Take 1,000 mcg by mouth daily., Disp: , Rfl:  .  [START ON 03/26/2017] HYDROcodone-acetaminophen (NORCO/VICODIN) 5-325 MG tablet, Take 1 tablet by mouth every 6 (six)  hours as needed for severe pain., Disp: 120 tablet, Rfl: 0 .  [START ON 02/24/2017] HYDROcodone-acetaminophen (NORCO/VICODIN) 5-325 MG tablet, Take 1 tablet by mouth every 6 (six) hours as needed for  severe pain., Disp: 120 tablet, Rfl: 0 .  [START ON 03/26/2017] morphine (MS CONTIN) 30 MG 12 hr tablet, Take 1 tablet (30 mg total) by mouth every 8 (eight) hours as needed for pain., Disp: 90 tablet, Rfl: 0 .  [START ON 02/24/2017] morphine (MS CONTIN) 30 MG 12 hr tablet, Take 1 tablet (30 mg total) by mouth every 8 (eight) hours as needed for pain., Disp: 90 tablet, Rfl: 0  ROS  Constitutional: Denies any fever or chills Gastrointestinal: No reported hemesis, hematochezia, vomiting, or acute GI distress Musculoskeletal: Denies any acute onset joint swelling, redness, loss of ROM, or weakness Neurological: No reported episodes of acute onset apraxia, aphasia, dysarthria, agnosia, amnesia, paralysis, loss of coordination, or loss of consciousness  Allergies  Ms. Pascale is allergic to augmentin [amoxicillin-pot clavulanate]; sulfa antibiotics; and mobic [meloxicam].  [place]  Drug: Ms. Cayson  reports that she does not use drugs. Alcohol:  reports that she does not drink alcohol. Tobacco:  reports that she has quit smoking. she has never used smokeless tobacco. Medical:  has a past medical history of Allergy, Arthritis, Asthma, Chronic kidney disease, Depression, Diabetes mellitus without complication ([place]), Hypertension, Multiple sclerosis ([place]), Neuromuscular disorder ([place]), and Sleep apnea. Surgical: Ms. Joy  has a past surgical history that includes Cesarean section (1991 and 1996). Family: family history includes Dementia in her mother; Depression in her mother; Diabetes in her mother; Hyperlipidemia in her mother; Stroke in her father.  Constitutional Exam  General appearance: alert, cooperative and morbidly obese Vitals:   02/01/17 1249  BP: (!) 143/90  Pulse: 97  Resp: 18  Temp: 98.5 F (36.9 C)  TempSrc: Oral  SpO2: 97%  Weight: (!) 315 lb (142.9 kg)  Height: 5' 2"  (1.575 m)  Psych/Mental status: Alert, oriented x 3 (person, place, & time)       Eyes:  PERLA Respiratory: No evidence of acute respiratory distress  Cervical Spine Area Exam  Skin & Axial Inspection: No masses, redness, edema, swelling, or associated skin lesions Alignment: Symmetrical Functional ROM: Unrestricted ROM      Stability: No instability detected Muscle Tone/Strength: Functionally intact. No obvious neuro-muscular anomalies detected. Sensory (Neurological): Unimpaired Palpation: No palpable anomalies              Upper Extremity (UE) Exam    Side: Right upper extremity  Side: Left upper extremity  Skin & Extremity Inspection: Skin color, temperature, and hair growth are WNL. No peripheral edema or cyanosis. No masses, redness, swelling, asymmetry, or associated skin lesions. No contractures.  Skin & Extremity Inspection: Skin color, temperature, and hair growth are WNL. No peripheral edema or cyanosis. No masses, redness, swelling, asymmetry, or associated skin lesions. No contractures.  Functional ROM: Unrestricted ROM          Functional ROM: Unrestricted ROM          Muscle Tone/Strength: Functionally intact. No obvious neuro-muscular anomalies detected.  Muscle Tone/Strength: Functionally intact. No obvious neuro-muscular anomalies detected.  Sensory (Neurological): Unimpaired          Sensory (Neurological): Unimpaired          Palpation: No palpable anomalies              Palpation: No palpable anomalies  Specialized Test(s): Deferred         Specialized Test(s): Deferred          Thoracic Spine Area Exam  Skin & Axial Inspection: No masses, redness, or swelling Alignment: Symmetrical Functional ROM: Unrestricted ROM Stability: No instability detected Muscle Tone/Strength: Functionally intact. No obvious neuro-muscular anomalies detected. Sensory (Neurological): Unimpaired Muscle strength & Tone: No palpable anomalies  Lumbar Spine Area Exam  Skin & Axial Inspection: No masses, redness, or swelling Alignment: Symmetrical Functional ROM:  Unrestricted ROM      Stability: No instability detected Muscle Tone/Strength: Functionally intact. No obvious neuro-muscular anomalies detected. Sensory (Neurological): Unimpaired Palpation: No palpable anomalies       Provocative Tests: Lumbar Hyperextension and rotation test: evaluation deferred today       Lumbar Lateral bending test: evaluation deferred today       Patrick's Maneuver: evaluation deferred today                    Gait & Posture Assessment  Ambulation: Patient ambulates using a wheel chair Gait: Relatively normal for age and body habitus Posture: WNL   Lower Extremity Exam    Side: Right lower extremity  Side: Left lower extremity  Skin & Extremity Inspection: Skin color, temperature, and hair growth are WNL. No peripheral edema or cyanosis. No masses, redness, swelling, asymmetry, or associated skin lesions. No contractures.  Skin & Extremity Inspection: Skin color, temperature, and hair growth are WNL. No peripheral edema or cyanosis. No masses, redness, swelling, asymmetry, or associated skin lesions. No contractures.  Functional ROM: Unrestricted ROM          Functional ROM: Unrestricted ROM          Muscle Tone/Strength: Functionally intact. No obvious neuro-muscular anomalies detected.  Muscle Tone/Strength: Functionally intact. No obvious neuro-muscular anomalies detected.  Sensory (Neurological): Unimpaired  Sensory (Neurological): Unimpaired  Palpation: No palpable anomalies  Palpation: No palpable anomalies   Assessment  Primary Diagnosis & Pertinent Problem List: The primary encounter diagnosis was Chronic lumbar radicular pain (Right). Diagnoses of Chronic sacroiliac joint pain (bilateral), Chronic hip pain (Left), Multiple sclerosis ([place]), Fibromyalgia, and Chronic pain syndrome were also pertinent to this visit.  Status Diagnosis  Controlled Controlled Controlled 1. Chronic lumbar radicular pain (Right)   2. Chronic sacroiliac joint pain (bilateral)    3. Chronic hip pain (Left)   4. Multiple sclerosis ([place])   5. Fibromyalgia   6. Chronic pain syndrome     Problems updated and reviewed during this visit: No problems updated. Plan of Care  Pharmacotherapy (Medications Ordered): Meds ordered this encounter  Medications  . HYDROcodone-acetaminophen (NORCO/VICODIN) 5-325 MG tablet    Sig: Take 1 tablet by mouth every 6 (six) hours as needed for severe pain.    Dispense:  120 tablet    Refill:  0    Do not place this medication, or any other prescription from our practice, on "Automatic Refill". Patient may have prescription filled one day early if pharmacy is closed on scheduled refill date. Do not fill until: 04/25/2017 To last until: 05/25/2017    Order Specific Question:   Supervising Provider    Answer:   Milinda Pointer [phone]  . HYDROcodone-acetaminophen (NORCO/VICODIN) 5-325 MG tablet    Sig: Take 1 tablet by mouth every 6 (six) hours as needed for severe pain.    Dispense:  120 tablet    Refill:  0    Do not place this medication, or any other prescription  from our practice, on "Automatic Refill". Patient may have prescription filled one day early if pharmacy is closed on scheduled refill date. Do not fill until: 03/26/2017 To last until: 04/25/2017    Order Specific Question:   Supervising Provider    Answer:   Milinda Pointer [phone]  . HYDROcodone-acetaminophen (NORCO/VICODIN) 5-325 MG tablet    Sig: Take 1 tablet by mouth every 6 (six) hours as needed for severe pain.    Dispense:  120 tablet    Refill:  0    Do not place this medication, or any other prescription from our practice, on "Automatic Refill". Patient may have prescription filled one day early if pharmacy is closed on scheduled refill date. Do not fill until:02/24/2017 To last until: 03/26/2017    Order Specific Question:   Supervising Provider    Answer:   Milinda Pointer [phone]  . morphine (MS CONTIN) 30 MG 12 hr tablet    Sig: Take 1 tablet (30  mg total) by mouth every 8 (eight) hours as needed for pain.    Dispense:  90 tablet    Refill:  0    Do not place this medication, or any other prescription from our practice, on "Automatic Refill". Patient may have prescription filled one day early if pharmacy is closed on scheduled refill date. Do not fill until: 04/25/2017 To last until: 05/25/2017    Order Specific Question:   Supervising Provider    Answer:   Milinda Pointer [phone]  . morphine (MS CONTIN) 30 MG 12 hr tablet    Sig: Take 1 tablet (30 mg total) by mouth every 8 (eight) hours as needed for pain.    Dispense:  90 tablet    Refill:  0    Do not place this medication, or any other prescription from our practice, on "Automatic Refill". Patient may have prescription filled one day early if pharmacy is closed on scheduled refill date. Do not fill until:03/26/2017 To last until: 04/25/2017    Order Specific Question:   Supervising Provider    Answer:   Milinda Pointer [phone]  . morphine (MS CONTIN) 30 MG 12 hr tablet    Sig: Take 1 tablet (30 mg total) by mouth every 8 (eight) hours as needed for pain.    Dispense:  90 tablet    Refill:  0    Do not place this medication, or any other prescription from our practice, on "Automatic Refill". Patient may have prescription filled one day early if pharmacy is closed on scheduled refill date. Do not fill until: 02/24/2017 To last until:03/26/2017    Order Specific Question:   Supervising Provider    Answer:   Milinda Pointer [[id number]]   New Prescriptions   No medications on file   Medications administered today: Sandeep A. Mchaney had no medications administered during this visit. Lab-work, procedure(s), and/or referral(s): No orders of the defined types were placed in this encounter.  Imaging and/or referral(s): None  Interventional therapies: Planned, scheduled, and/or pending:   Not at this time.   Considering:   Not at this time.   Palliative PRN  treatment(s):   Not at this time.   Provider-requested follow-up: Return in about 3 months (around 05/02/2017) for MedMgmt with Me Donella Stade Edison Pace).  Future Appointments  Date Time Provider [place]  04/26/2017 12:45 PM Vevelyn Francois, NP [facility] None   Primary Care Physician: Hortencia Pilar, MD Location: [facility] Out[facility] Pain Management Facility Note by: Vevelyn Francois NP Date: 02/01/2017;  Time: 2:57 PM  Pain Score Disclaimer: We use the NRS-11 scale. This is a self-reported, subjective measurement of pain severity with only modest accuracy. It is used primarily to identify changes within a particular patient. It must be understood that outpatient pain scales are significantly less accurate that those used for research, where they can be applied under ideal controlled circumstances with minimal exposure to variables. In reality, the score is likely to be a combination of pain intensity and pain affect, where pain affect describes the degree of emotional arousal or changes in action readiness caused by the sensory experience of pain. Factors such as social and work situation, setting, emotional state, anxiety levels, expectation, and prior pain experience may influence pain perception and show large inter-individual differences that may also be affected by time variables.  Patient instructions provided during this appointment: Patient Instructions    ____________________________________________________________________________________________  Medication Rules  Applies to: All patients receiving prescriptions (written or electronic).  Pharmacy of record: Pharmacy where electronic prescriptions will be sent. If written prescriptions are taken to a different pharmacy, please inform the nursing staff. The pharmacy listed in the electronic medical record should be the one where you would like electronic prescriptions to be sent.  Prescription refills: Only during scheduled  appointments. Applies to both, written and electronic prescriptions.  NOTE: The following applies primarily to controlled substances (Opioid* Pain Medications).   Patient's responsibilities: 1. Pain Pills: Bring all pain pills to every appointment (except for procedure appointments). 2. Pill Bottles: Bring pills in original pharmacy bottle. Always bring newest bottle. Bring bottle, even if empty. 3. Medication refills: You are responsible for knowing and keeping track of what medications you need refilled. The day before your appointment, write a list of all prescriptions that need to be refilled. Bring that list to your appointment and give it to the admitting nurse. Prescriptions will be written only during appointments. If you forget a medication, it will not be "Called in", "Faxed", or "electronically sent". You will need to get another appointment to get these prescribed. 4. Prescription Accuracy: You are responsible for carefully inspecting your prescriptions before leaving our office. Have the discharge nurse carefully go over each prescription with you, before taking them home. Make sure that your name is accurately spelled, that your address is correct. Check the name and dose of your medication to make sure it is accurate. Check the number of pills, and the written instructions to make sure they are clear and accurate. Make sure that you are given enough medication to last until your next medication refill appointment. 5. Taking Medication: Take medication as prescribed. Never take more pills than instructed. Never take medication more frequently than prescribed. Taking less pills or less frequently is permitted and encouraged, when it comes to controlled substances (written prescriptions).  6. Inform other Doctors: Always inform, all of your healthcare providers, of all the medications you take. 7. Pain Medication from other Providers: You are not allowed to accept any additional pain  medication from any other Doctor or Healthcare provider. There are two exceptions to this rule. (see below) In the event that you require additional pain medication, you are responsible for notifying us, as stated below. 8. Medication Agreement: You are responsible for carefully reading and following our Medication Agreement. This must be signed before receiving any prescriptions from our practice. Safely store a copy of your signed Agreement. Violations to the Agreement will result in no further prescriptions. (Additional copies of our Medication Agreement are available  upon request.) 9. Laws, Rules, & Regulations: All patients are expected to follow all Federal and [employer], [employer], Rules, [employer] [facility]. Ignorance of the Laws does not constitute a valid excuse. The use of any illegal substances is prohibited. 10. Adopted CDC guidelines & recommendations: Target dosing levels will be at or below 60 MME/day. Use of benzodiazepines** is not recommended.  Exceptions: There are only two exceptions to the rule of not receiving pain medications from other Healthcare Providers. 1. Exception #1 (Emergencies): In the event of an emergency (i.e.: accident requiring emergency care), you are allowed to receive additional pain medication. However, you are responsible for: As soon as you are able, call our office [phone], at any time of the day or night, and leave a message stating your name, the date and nature of the emergency, and the name and dose of the medication prescribed. In the event that your call is answered by a member of our staff, make sure to document and save the date, time, and the name of the person that took your information.  2. Exception #2 (Planned Surgery): In the event that you are scheduled by another doctor or dentist to have any type of surgery or procedure, you are allowed (for a period no longer than 30 days), to receive additional pain medication, for the acute post-op pain.  However, in this case, you are responsible for picking up a copy of our "Post-op Pain Management for Surgeons" handout, and giving it to your surgeon or dentist. This document is available at our office, and does not require an appointment to obtain it. Simply go to our office during business hours (Monday-Thursday from 8:00 AM to 4:00 PM) (Friday 8:00 AM to 12:00 Noon) or if you have a scheduled appointment with [place], prior to your surgery, and ask for it by name. In addition, you will need to provide [place] with your name, name of your surgeon, type of surgery, and date of procedure or surgery.  *Opioid medications include: morphine, codeine, oxycodone, oxymorphone, hydrocodone, hydromorphone, meperidine, tramadol, tapentadol, buprenorphine, fentanyl, methadone. **Benzodiazepine medications include: diazepam (Valium), alprazolam (Xanax), clonazepam (Klonopine), lorazepam (Ativan), clorazepate (Tranxene), chlordiazepoxide (Librium), estazolam (Prosom), oxazepam (Serax), temazepam (Restoril), triazolam (Halcion)  ____________________________________________________________________________________________   BMI Assessment: Estimated body mass index is 57.61 kg/m as calculated from the following:   Height as of this encounter: 5' 2"  (1.575 m).   Weight as of this encounter: 315 lb (142.9 kg).  BMI interpretation table: BMI level Category Range association with higher incidence of chronic pain  <18 kg/m2 Underweight   18.5-24.9 kg/m2 Ideal body weight   25-29.9 kg/m2 Overweight Increased incidence by 20%  30-34.9 kg/m2 Obese (Class I) Increased incidence by 68%  35-39.9 kg/m2 Severe obesity (Class II) Increased incidence by 136%  >40 kg/m2 Extreme obesity (Class III) Increased incidence by 254%   BMI Readings from Last 4 Encounters:  02/01/17 57.61 kg/m  11/01/16 55.79 kg/m  08/02/16 57.61 kg/m  05/04/16 56.33 kg/m   Wt Readings from Last 4 Encounters:  02/01/17 (!) 315 lb (142.9 kg)   11/01/16 (!) 305 lb (138.3 kg)  08/02/16 (!) 315 lb (142.9 kg)  05/04/16 (!) 308 lb (139.7 kg)

## 2017-02-01 NOTE — Patient Instructions (Addendum)
____________________________________________________________________________________________  Medication Rules  Applies to: All patients receiving prescriptions (written or electronic).  Pharmacy of record: Pharmacy where electronic prescriptions will be sent. If written prescriptions are taken to a different pharmacy, please inform the nursing staff. The pharmacy listed in the electronic medical record should be the one where you would like electronic prescriptions to be sent.  Prescription refills: Only during scheduled appointments. Applies to both, written and electronic prescriptions.  NOTE: The following applies primarily to controlled substances (Opioid* Pain Medications).   Patient's responsibilities: 1. Pain Pills: Bring all pain pills to every appointment (except for procedure appointments). 2. Pill Bottles: Bring pills in original pharmacy bottle. Always bring newest bottle. Bring bottle, even if empty. 3. Medication refills: You are responsible for knowing and keeping track of what medications you need refilled. The day before your appointment, write a list of all prescriptions that need to be refilled. Bring that list to your appointment and give it to the admitting nurse. Prescriptions will be written only during appointments. If you forget a medication, it will not be "Called in", "Faxed", or "electronically sent". You will need to get another appointment to get these prescribed. 4. Prescription Accuracy: You are responsible for carefully inspecting your prescriptions before leaving our office. Have the discharge nurse carefully go over each prescription with you, before taking them home. Make sure that your name is accurately spelled, that your address is correct. Check the name and dose of your medication to make sure it is accurate. Check the number of pills, and the written instructions to make sure they are clear and accurate. Make sure that you are given enough medication to  last until your next medication refill appointment. 5. Taking Medication: Take medication as prescribed. Never take more pills than instructed. Never take medication more frequently than prescribed. Taking less pills or less frequently is permitted and encouraged, when it comes to controlled substances (written prescriptions).  6. Inform other Doctors: Always inform, all of your healthcare providers, of all the medications you take. 7. Pain Medication from other Providers: You are not allowed to accept any additional pain medication from any other Doctor or Healthcare provider. There are two exceptions to this rule. (see below) In the event that you require additional pain medication, you are responsible for notifying us, as stated below. 8. Medication Agreement: You are responsible for carefully reading and following our Medication Agreement. This must be signed before receiving any prescriptions from our practice. Safely store a copy of your signed Agreement. Violations to the Agreement will result in no further prescriptions. (Additional copies of our Medication Agreement are available upon request.) 9. Laws, Rules, & Regulations: All patients are expected to follow all Federal and State Laws, Statutes, Rules, & Regulations. Ignorance of the Laws does not constitute a valid excuse. The use of any illegal substances is prohibited. 10. Adopted CDC guidelines & recommendations: Target dosing levels will be at or below 60 MME/day. Use of benzodiazepines** is not recommended.  Exceptions: There are only two exceptions to the rule of not receiving pain medications from other Healthcare Providers. 1. Exception #1 (Emergencies): In the event of an emergency (i.e.: accident requiring emergency care), you are allowed to receive additional pain medication. However, you are responsible for: As soon as you are able, call our office [phone], at any time of the day or night, and leave a message stating your  name, the date and nature of the emergency, and the name and dose of the medication   prescribed. In the event that your call is answered by a member of our staff, make sure to document and save the date, time, and the name of the person that took your information.  2. Exception #2 (Planned Surgery): In the event that you are scheduled by another doctor or dentist to have any type of surgery or procedure, you are allowed (for a period no longer than 30 days), to receive additional pain medication, for the acute post-op pain. However, in this case, you are responsible for picking up a copy of our "Post-op Pain Management for Surgeons" handout, and giving it to your surgeon or dentist. This document is available at our office, and does not require an appointment to obtain it. Simply go to our office during business hours (Monday-Thursday from 8:00 AM to 4:00 PM) (Friday 8:00 AM to 12:00 Noon) or if you have a scheduled appointment with [place], prior to your surgery, and ask for it by name. In addition, you will need to provide [place] with your name, name of your surgeon, type of surgery, and date of procedure or surgery.  *Opioid medications include: morphine, codeine, oxycodone, oxymorphone, hydrocodone, hydromorphone, meperidine, tramadol, tapentadol, buprenorphine, fentanyl, methadone. **Benzodiazepine medications include: diazepam (Valium), alprazolam (Xanax), clonazepam (Klonopine), lorazepam (Ativan), clorazepate (Tranxene), chlordiazepoxide (Librium), estazolam (Prosom), oxazepam (Serax), temazepam (Restoril), triazolam (Halcion)  ____________________________________________________________________________________________   BMI Assessment: Estimated body mass index is 57.61 kg/m as calculated from the following:   Height as of this encounter: 5\' 2"  (1.575 m).   Weight as of this encounter: 315 lb (142.9 kg).  BMI interpretation table: BMI level Category Range association with higher incidence of chronic  pain  <18 kg/m2 Underweight   18.5-24.9 kg/m2 Ideal body weight   25-29.9 kg/m2 Overweight Increased incidence by 20%  30-34.9 kg/m2 Obese (Class I) Increased incidence by 68%  35-39.9 kg/m2 Severe obesity (Class II) Increased incidence by 136%  >40 kg/m2 Extreme obesity (Class III) Increased incidence by 254%   BMI Readings from Last 4 Encounters:  02/01/17 57.61 kg/m  11/01/16 55.79 kg/m  08/02/16 57.61 kg/m  05/04/16 56.33 kg/m   Wt Readings from Last 4 Encounters:  02/01/17 (!) 315 lb (142.9 kg)  11/01/16 (!) 305 lb (138.3 kg)  08/02/16 (!) 315 lb (142.9 kg)  05/04/16 (!) 308 lb (139.7 kg)

## 2017-02-01 NOTE — Progress Notes (Signed)
Nursing Pain Medication Assessment:  Safety precautions to be maintained throughout the outpatient stay will include: orient to surroundings, keep bed in low position, maintain call bell within reach at all times, provide assistance with transfer out of bed and ambulation.  Medication Inspection Compliance: Pill count conducted under aseptic conditions, in front of the patient. Neither the pills nor the bottle was removed from the patient's sight at any time. Once count was completed pills were immediately returned to the patient in their original bottle.  Medication #1: Hydrocodone/APAP Pill/Patch Count: 75 of 120 pills remain Pill/Patch Appearance: Markings consistent with prescribed medication Bottle Appearance: Standard pharmacy container. Clearly labeled. Filled Date: 01/17 / 2019 Last Medication intake:  Today  Medication #2: Morphine ER (MSContin) Pill/Patch Count: 77 of 90 pills remain Pill/Patch Appearance: Markings consistent with prescribed medication Bottle Appearance: Standard pharmacy container. Clearly labeled. Filled Date: 01/18 / 2019 Last Medication intake:  Today

## 2017-03-11 ENCOUNTER — Ambulatory Visit
Admission: EM | Admit: 2017-03-11 | Discharge: 2017-03-11 | Disposition: A | Discharge status: Home / Self Care | Payer: Medicare HMO

## 2017-04-24 ENCOUNTER — Ambulatory Visit: Payer: BLUE CROSS/BLUE SHIELD | Attending: Nurse Practitioner | Admitting: Nurse Practitioner

## 2017-04-24 ENCOUNTER — Other Ambulatory Visit: Payer: Self-pay

## 2017-04-24 ENCOUNTER — Encounter: Payer: Self-pay | Admitting: Nurse Practitioner

## 2017-04-24 VITALS — BP 129/86 | HR 86 | Temp 98.6°F | Resp 16 | Ht 62.0 in | Wt 315.0 lb

## 2017-04-24 DIAGNOSIS — I129 Hypertensive chronic kidney disease with stage 1 through stage 4 chronic kidney disease, or unspecified chronic kidney disease: Secondary | ICD-10-CM | POA: Insufficient documentation

## 2017-04-24 DIAGNOSIS — Z886 Allergy status to analgesic agent status: Secondary | ICD-10-CM | POA: Insufficient documentation

## 2017-04-24 DIAGNOSIS — M4726 Other spondylosis with radiculopathy, lumbar region: Secondary | ICD-10-CM | POA: Insufficient documentation

## 2017-04-24 DIAGNOSIS — Z87891 Personal history of nicotine dependence: Secondary | ICD-10-CM | POA: Diagnosis not present

## 2017-04-24 DIAGNOSIS — Z7982 Long term (current) use of aspirin: Secondary | ICD-10-CM | POA: Diagnosis not present

## 2017-04-24 DIAGNOSIS — K219 Gastro-esophageal reflux disease without esophagitis: Secondary | ICD-10-CM | POA: Diagnosis not present

## 2017-04-24 DIAGNOSIS — Z86010 Personal history of colonic polyps: Secondary | ICD-10-CM | POA: Insufficient documentation

## 2017-04-24 DIAGNOSIS — Z79891 Long term (current) use of opiate analgesic: Secondary | ICD-10-CM | POA: Insufficient documentation

## 2017-04-24 DIAGNOSIS — J45909 Unspecified asthma, uncomplicated: Secondary | ICD-10-CM | POA: Insufficient documentation

## 2017-04-24 DIAGNOSIS — G35 Multiple sclerosis: Secondary | ICD-10-CM | POA: Diagnosis not present

## 2017-04-24 DIAGNOSIS — E538 Deficiency of other specified B group vitamins: Secondary | ICD-10-CM | POA: Insufficient documentation

## 2017-04-24 DIAGNOSIS — M797 Fibromyalgia: Secondary | ICD-10-CM | POA: Diagnosis not present

## 2017-04-24 DIAGNOSIS — G894 Chronic pain syndrome: Secondary | ICD-10-CM | POA: Diagnosis not present

## 2017-04-24 DIAGNOSIS — M4316 Spondylolisthesis, lumbar region: Secondary | ICD-10-CM | POA: Insufficient documentation

## 2017-04-24 DIAGNOSIS — M5441 Lumbago with sciatica, right side: Secondary | ICD-10-CM | POA: Insufficient documentation

## 2017-04-24 DIAGNOSIS — Z823 Family history of stroke: Secondary | ICD-10-CM | POA: Insufficient documentation

## 2017-04-24 DIAGNOSIS — Z833 Family history of diabetes mellitus: Secondary | ICD-10-CM | POA: Insufficient documentation

## 2017-04-24 DIAGNOSIS — N183 Chronic kidney disease, stage 3 (moderate): Secondary | ICD-10-CM | POA: Diagnosis not present

## 2017-04-24 DIAGNOSIS — M25559 Pain in unspecified hip: Secondary | ICD-10-CM | POA: Diagnosis present

## 2017-04-24 DIAGNOSIS — E1122 Type 2 diabetes mellitus with diabetic chronic kidney disease: Secondary | ICD-10-CM | POA: Insufficient documentation

## 2017-04-24 DIAGNOSIS — F341 Dysthymic disorder: Secondary | ICD-10-CM | POA: Diagnosis not present

## 2017-04-24 DIAGNOSIS — M1990 Unspecified osteoarthritis, unspecified site: Secondary | ICD-10-CM | POA: Insufficient documentation

## 2017-04-24 DIAGNOSIS — N189 Chronic kidney disease, unspecified: Secondary | ICD-10-CM | POA: Insufficient documentation

## 2017-04-24 DIAGNOSIS — G4733 Obstructive sleep apnea (adult) (pediatric): Secondary | ICD-10-CM | POA: Diagnosis not present

## 2017-04-24 DIAGNOSIS — Z79899 Other long term (current) drug therapy: Secondary | ICD-10-CM | POA: Insufficient documentation

## 2017-04-24 DIAGNOSIS — Z794 Long term (current) use of insulin: Secondary | ICD-10-CM | POA: Insufficient documentation

## 2017-04-24 DIAGNOSIS — Z818 Family history of other mental and behavioral disorders: Secondary | ICD-10-CM | POA: Insufficient documentation

## 2017-04-24 DIAGNOSIS — M1612 Unilateral primary osteoarthritis, left hip: Secondary | ICD-10-CM | POA: Diagnosis not present

## 2017-04-24 DIAGNOSIS — E7800 Pure hypercholesterolemia, unspecified: Secondary | ICD-10-CM | POA: Diagnosis not present

## 2017-04-24 DIAGNOSIS — E559 Vitamin D deficiency, unspecified: Secondary | ICD-10-CM | POA: Insufficient documentation

## 2017-04-24 DIAGNOSIS — Z882 Allergy status to sulfonamides status: Secondary | ICD-10-CM | POA: Insufficient documentation

## 2017-04-24 DIAGNOSIS — M47816 Spondylosis without myelopathy or radiculopathy, lumbar region: Secondary | ICD-10-CM | POA: Diagnosis not present

## 2017-04-24 DIAGNOSIS — Z6841 Body Mass Index (BMI) 40.0 and over, adult: Secondary | ICD-10-CM | POA: Diagnosis not present

## 2017-04-24 DIAGNOSIS — Z880 Allergy status to penicillin: Secondary | ICD-10-CM | POA: Insufficient documentation

## 2017-04-24 MED ORDER — MORPHINE SULFATE ER 30 MG PO TBCR: 30.0000 mg | EXTENDED_RELEASE_TABLET | Freq: Three times a day (TID) | ORAL | 0 refills | Status: DC | PRN

## 2017-04-24 MED ORDER — HYDROCODONE-ACETAMINOPHEN 5-325 MG PO TABS: 1.0000 | ORAL_TABLET | Freq: Four times a day (QID) | ORAL | 0 refills | Status: DC | PRN

## 2017-04-24 NOTE — Patient Instructions (Addendum)
__You have been given 3 prescriptions today for Hydrocodone and 3 prescriptions for Morphine    __________________________________________________________________________________________  Medication Rules  Applies to: All patients receiving prescriptions (written or electronic).  Pharmacy of record: Pharmacy where electronic prescriptions will be sent. If written prescriptions are taken to a different pharmacy, please inform the nursing staff. The pharmacy listed in the electronic medical record should be the one where you would like electronic prescriptions to be sent.  Prescription refills: Only during scheduled appointments. Applies to both, written and electronic prescriptions.  NOTE: The following applies primarily to controlled substances (Opioid* Pain Medications).   Patient's responsibilities: 1. Pain Pills: Bring all pain pills to every appointment (except for procedure appointments). 2. Pill Bottles: Bring pills in original pharmacy bottle. Always bring newest bottle. Bring bottle, even if empty. 3. Medication refills: You are responsible for knowing and keeping track of what medications you need refilled. The day before your appointment, write a list of all prescriptions that need to be refilled. Bring that list to your appointment and give it to the admitting nurse. Prescriptions will be written only during appointments. If you forget a medication, it will not be "Called in", "Faxed", or "electronically sent". You will need to get another appointment to get these prescribed. 4. Prescription Accuracy: You are responsible for carefully inspecting your prescriptions before leaving our office. Have the discharge nurse carefully go over each prescription with you, before taking them home. Make sure that your name is accurately spelled, that your address is correct. Check the name and dose of your medication to make sure it is accurate. Check the number of pills, and the written instructions to  make sure they are clear and accurate. Make sure that you are given enough medication to last until your next medication refill appointment. 5. Taking Medication: Take medication as prescribed. Never take more pills than instructed. Never take medication more frequently than prescribed. Taking less pills or less frequently is permitted and encouraged, when it comes to controlled substances (written prescriptions).  6. Inform other Doctors: Always inform, all of your healthcare providers, of all the medications you take. 7. Pain Medication from other Providers: You are not allowed to accept any additional pain medication from any other Doctor or Healthcare provider. There are two exceptions to this rule. (see below) In the event that you require additional pain medication, you are responsible for notifying us, as stated below. 8. Medication Agreement: You are responsible for carefully reading and following our Medication Agreement. This must be signed before receiving any prescriptions from our practice. Safely store a copy of your signed Agreement. Violations to the Agreement will result in no further prescriptions. (Additional copies of our Medication Agreement are available upon request.) 9. Laws, Rules, & Regulations: All patients are expected to follow all [address] and [employer], Rules, [employer]. Ignorance of the Laws does not constitute a valid excuse. The use of any illegal substances is prohibited. 10. Adopted CDC guidelines & recommendations: Target dosing levels will be at or below 60 MME/day. Use of benzodiazepines** is not recommended.  Exceptions: There are only two exceptions to the rule of not receiving pain medications from other Healthcare Providers. 1. Exception #1 (Emergencies): In the event of an emergency (i.e.: accident requiring emergency care), you are allowed to receive additional pain medication. However, you are responsible for: As soon as you are able, call our  office [phone], at any time of the day or night, and leave a message stating  your name, the date and nature of the emergency, and the name and dose of the medication prescribed. In the event that your call is answered by a member of our staff, make sure to document and save the date, time, and the name of the person that took your information.  2. Exception #2 (Planned Surgery): In the event that you are scheduled by another doctor or dentist to have any type of surgery or procedure, you are allowed (for a period no longer than 30 days), to receive additional pain medication, for the acute post-op pain. However, in this case, you are responsible for picking up a copy of our "Post-op Pain Management for Surgeons" handout, and giving it to your surgeon or dentist. This document is available at our office, and does not require an appointment to obtain it. Simply go to our office during business hours (Monday-Thursday from 8:00 AM to 4:00 PM) (Friday 8:00 AM to 12:00 Noon) or if you have a scheduled appointment with [place], prior to your surgery, and ask for it by name. In addition, you will need to provide [place] with your name, name of your surgeon, type of surgery, and date of procedure or surgery.  *Opioid medications include: morphine, codeine, oxycodone, oxymorphone, hydrocodone, hydromorphone, meperidine, tramadol, tapentadol, buprenorphine, fentanyl, methadone. **Benzodiazepine medications include: diazepam (Valium), alprazolam (Xanax), clonazepam (Klonopine), lorazepam (Ativan), clorazepate (Tranxene), chlordiazepoxide (Librium), estazolam (Prosom), oxazepam (Serax), temazepam (Restoril), triazolam (Halcion) (Last updated: 03/08/2017) ____________________________________________________________________________________________    BMI Assessment: Estimated body mass index is 57.61 kg/m as calculated from the following:   Height as of this encounter: 5\' 2"  (1.575 m).   Weight as of this encounter: 315  lb (142.9 kg).  BMI interpretation table: BMI level Category Range association with higher incidence of chronic pain  <18 kg/m2 Underweight   18.5-24.9 kg/m2 Ideal body weight   25-29.9 kg/m2 Overweight Increased incidence by 20%  30-34.9 kg/m2 Obese (Class I) Increased incidence by 68%  35-39.9 kg/m2 Severe obesity (Class II) Increased incidence by 136%  >40 kg/m2 Extreme obesity (Class III) Increased incidence by 254%   BMI Readings from Last 4 Encounters:  04/24/17 57.61 kg/m  02/01/17 57.61 kg/m  11/01/16 55.79 kg/m  08/02/16 57.61 kg/m   Wt Readings from Last 4 Encounters:  04/24/17 (!) 315 lb (142.9 kg)  02/01/17 (!) 315 lb (142.9 kg)  11/01/16 (!) 305 lb (138.3 kg)  08/02/16 (!) 315 lb (142.9 kg)

## 2017-04-24 NOTE — Progress Notes (Signed)
Patient's Name: Tiffany Herring  MRN: [id number]  Referring Provider: Hortencia Pilar, MD  DOB: [birth year]  PCP: Hortencia Pilar, MD  DOS: 04/24/2017  Note by: Vevelyn Francois NP  Service setting: Ambulatory outpatient  Specialty: Interventional Pain Management  Location: [facility] (AMB) Pain Management Facility    Patient type: Established    Primary Reason(s) for Visit: Encounter for prescription drug management. (Level of risk: moderate)  CC: Hip Pain and Knee Pain  HPI  Tiffany Herring is a 57 y.o. year old, female patient, who comes today for a medication management evaluation. She has Failed back surgical syndrome; Epidural fibrosis; Chronic bilateral low back pain with right-sided sciatica; Chronic lumbar radicular pain (Right); Long term current use of opiate analgesic; Long term prescription opiate use; Opiate use (80 MME/Day); Opiate dependence ([place]); Encounter for therapeutic drug level monitoring; Allergic rhinitis; Carotid artery bruit; Chronic kidney disease (CKD), stage III (moderate) ([place]); Colon polyp; Diabetes mellitus ([place]); Dysthymia; Essential (primary) hypertension; Generalized OA; Acid reflux; History of intermenstrual bleeding; History of migraine; H/O nutritional disorder; Hypercholesterolemia; Type 2 diabetes mellitus treated with insulin ([place]); Other long term (current) drug therapy; Menopausal and perimenopausal disorder; Class III Morbid obesity ([place]) (254% higher incidence of chronic low back pain); Multiple sclerosis ([place]); Obstructive apnea; Degenerative arthritis of hip; Awareness of heartbeats; History of colon polyps; Post menopausal syndrome; Pure hypercholesterolemia; B12 deficiency; Fibromyalgia; Lumbar spondylosis; Grade 1 Anterolisthesis of L4 over L5; Chronic sacroiliac joint pain (bilateral); Chronic hip pain (Left); Arthropathy of left hip (Severe chronic left hip DJD); Osteoarthritis of hip (Left); Lumbar facet arthropathy ([place]); Lumbar facet hypertrophy; Lumbar facet  syndrome; Chronic flank pain (Right); Chronic lower extremity pain (Right); Breast cancer screening; Chronic pain syndrome; Morbid obesity with BMI of 50.0-59.9, adult ([place]); Post-menopausal; Venous stasis ulcer ([place]); Vitamin D deficiency; GERD (gastroesophageal reflux disease); OA (osteoarthritis); Palpitations; Vitamin B12 deficiency; Carotid bruit; CKD (chronic kidney disease); Osteoarthrosis, hip; Type 2 diabetes mellitus with stage 3 chronic kidney disease, with long-term current use of insulin ([place]); Long term use of drug; Splenomegaly; Essential hypertension with goal blood pressure less than 140/90; Generalized osteoarthritis; History of colonic polyps; History of irregular menstrual bleeding; Menopausal and postmenopausal disorder; and Obstructive sleep apnea on their problem list. Her primarily concern today is the Hip Pain and Knee Pain  Pain Assessment: Location:   Hip Radiating: radiated to front of leg to right knee Onset: More than a month ago Duration: Chronic pain Quality: Sharp Severity: 4 /10 (self-reported pain score)  Note: Reported level is compatible with observation.                          Timing: Constant Modifying factors: Medications   Tiffany Herring was last scheduled for an appointment on 02/01/2017 for medication management. During today's appointment we reviewed Tiffany Herring's chronic pain status, as well as her outpatient medication regimen. She denies any concerns today. She admits that her pain is stable. She   The patient  reports that she does not use drugs. Her body mass index is 57.61 kg/m.  Further details on both, my assessment(s), as well as the proposed treatment plan, please see below.  Controlled Substance Pharmacotherapy Assessment REMS (Risk Evaluation and Mitigation Strategy)  Analgesic:Morphine ER 30 mg every 8 hours (90 mg/day) + hydrocodone/APAP 5/325 one every 6 hours (20 mg/dayof hydrocodone) MME/day:193m/day.    Tiffany Herring   04/24/2017  1:50 PM  Sign at close encounter Nursing Pain Medication Assessment:  Safety precautions to be maintained throughout the outpatient stay will include: orient to surroundings, keep bed in low position, maintain call bell within reach at all times, provide assistance with transfer out of bed and ambulation.  Medication Inspection Compliance: Pill count conducted under aseptic conditions, in front of the patient. Neither the pills nor the bottle was removed from the patient's sight at any time. Once count was completed pills were immediately returned to the patient in their original bottle.  Medication #1: Oxycodone IR Pill/Patch Count: 10 of 120 pills remain Pill/Patch Appearance: Markings consistent with prescribed medication Bottle Appearance: Standard pharmacy container. Clearly labeled. Filled Date: 03/ 19/ 2019 Last Medication intake:  Today  Medication #2: Morphine IR Pill/Patch Count: 8 of 90 pills remain Pill/Patch Appearance: Markings consistent with prescribed medication Bottle Appearance: Standard pharmacy container. Clearly labeled. Filled Date: 03 / 18 / 2019 Last Medication intake:  TodaySafety precautions to be maintained throughout the outpatient stay will include: orient to surroundings, keep bed in low position, maintain call bell within reach at all times, provide assistance with transfer out of bed and ambulation.    Pharmacokinetics: Liberation and absorption (onset of action): WNL Distribution (time to peak effect): WNL Metabolism and excretion (duration of action): WNL         Pharmacodynamics: Desired effects: Analgesia: Tiffany Herring reports >50% benefit. Functional ability: Patient reports that medication allows her to accomplish basic ADLs Clinically meaningful improvement in function (CMIF): Sustained CMIF goals met Perceived effectiveness: Described as relatively effective, allowing for increase in activities of daily living (ADL) Undesirable  effects: Side-effects or Adverse reactions: None reported Monitoring: [place] PMP: Online review of the past 81-monthperiod conducted. Compliant with practice rules and regulations Last UDS on record: Summary  Date Value Ref Range Status  05/04/2016 FINAL  Final    Comment:    ==================================================================== TOXASSURE SELECT 13 (MW) ==================================================================== Test                             Result       Flag       Units Drug Present and Declared for Prescription Verification   Morphine                       >12500       EXPECTED   ng/mg creat    Potential sources of large amounts of morphine in the absence of    codeine include administration of morphine or use of heroin.   Hydrocodone                    824          EXPECTED   ng/mg creat   Hydromorphone                  195          EXPECTED   ng/mg creat   Norhydrocodone                 920          EXPECTED   ng/mg creat    Sources of hydrocodone include scheduled prescription    medications. Hydromorphone and norhydrocodone are expected    metabolites of hydrocodone. Hydromorphone is also available as a    scheduled prescription medication. ==================================================================== Test  Result    Flag   Units      Ref Range   Creatinine              80               mg/dL      >=20 ==================================================================== Declared Medications:  The flagging and interpretation on this report are based on the  following declared medications.  Unexpected results may arise from  inaccuracies in the declared medications.  **Note: The testing scope of this panel includes these medications:  Hydrocodone (Hydrocodone-Acetaminophen)  Morphine (MS Contin)  **Note: The testing scope of this panel does not include following  reported medications:  Acetaminophen  (Hydrocodone-Acetaminophen)  Albuterol  Bupropion (Wellbutrin)  Cholecalciferol  Cyanocobalamin  Enalapril (Vasotec)  Furosemide (Lasix)  Gabapentin  Mirtazapine (Remeron)  Montelukast (Singulair)  Oxybutynin (Ditropan)  Tizanidine (Zanaflex)  Triamcinolone (Kenalog) ==================================================================== For clinical consultation, please call [phone]. ====================================================================    UDS interpretation: Compliant          Medication Assessment Form: Reviewed. Patient indicates being compliant with therapy Treatment compliance: Compliant Risk Assessment Profile: Aberrant behavior: See prior evaluations. None observed or detected today Comorbid factors increasing risk of overdose: See prior notes. No additional risks detected today Risk of substance use disorder (SUD): Low Opioid Risk Tool - 04/24/17 1348      Family History of Substance Abuse   Alcohol  Negative    Illegal Drugs  Negative    Rx Drugs  Negative      Personal History of Substance Abuse   Alcohol  Negative    Illegal Drugs  Negative    Rx Drugs  Negative      Age   Age between 47-45 years   No      History of Preadolescent Sexual Abuse   History of Preadolescent Sexual Abuse  Negative or Female      Psychological Disease   Psychological Disease  Negative    Depression  Negative      Total Score   Opioid Risk Tool Scoring  0    Opioid Risk Interpretation  Low Risk      ORT Scoring interpretation table:  Score <3 = Low Risk for SUD  Score between 4-7 = Moderate Risk for SUD  Score >8 = High Risk for Opioid Abuse   Risk Mitigation Strategies:  Patient Counseling: Covered Patient-Prescriber Agreement (PPA): Present and active  Notification to other healthcare providers: Done  Pharmacologic Plan: No change in therapy, at this time.             Laboratory Chemistry  Inflammation Markers (CRP: Acute Phase) (ESR: Chronic  Phase) Lab Results  Component Value Date   CRP 2.0 (H) 02/15/2015   ESRSEDRATE 28 02/15/2015                         Rheumatology Markers No results found for: RF, ANA, LABURIC, URICUR, LYMEIGGIGMAB, LYMEABIGMQN                      Renal Function Markers Lab Results  Component Value Date   BUN 27 (H) 02/15/2015   CREATININE 1.42 (H) 02/15/2015   GFRAA 47 (L) 02/15/2015   GFRNONAA 41 (L) 02/15/2015                              Hepatic Function Markers Lab Results  Component Value Date   AST 16 02/15/2015   ALT 19 02/15/2015   ALBUMIN 4.3 02/15/2015   ALKPHOS 77 02/15/2015                        Electrolytes Lab Results  Component Value Date   NA 138 02/15/2015   K 4.8 02/15/2015   CL 102 02/15/2015   CALCIUM 9.4 02/15/2015   MG 2.0 02/15/2015                        Neuropathy Markers No results found for: VITAMINB12, FOLATE, HGBA1C, HIV                      Bone Pathology Markers No results found for: VD25OH, HB716RC7ELF, YB0175ZW2, HE5277OE4, 25OHVITD1, 25OHVITD2, 25OHVITD3, TESTOFREE, TESTOSTERONE                       Coagulation Parameters No results found for: INR, LABPROT, APTT, PLT, DDIMER                      Cardiovascular Markers No results found for: BNP, CKTOTAL, CKMB, TROPONINI, HGB, HCT                       CA Markers No results found for: CEA, CA125, LABCA2                      Note: Lab results reviewed.  Recent Diagnostic Imaging Results   Complexity Note: Imaging results reviewed. Results shared with Ms. Minette Brine, using [employer].                         Meds   Current Outpatient Medications:  .  albuterol (PROAIR HFA) 108 (90 Base) MCG/ACT inhaler, Inhale 1 puff into the lungs as needed. , Disp: , Rfl:  .  aspirin 81 MG chewable tablet, Chew 81 mg by mouth daily., Disp: , Rfl:  .  B-D INS SYR ULTRAFINE 1CC/30G 30G X 1/2" 1 ML MISC, , Disp: , Rfl:  .  Blood Glucose Monitoring Suppl (FIFTY50 GLUCOSE METER 2.0) w/Device KIT,  Check sugars three times daily.  On insulin.  E11.9, Disp: , Rfl:  .  buPROPion (WELLBUTRIN SR) 150 MG 12 hr tablet, Take 150 mg by mouth 2 (two) times daily. , Disp: , Rfl:  .  Cholecalciferol (VITAMIN D3) 5000 units CAPS, Take 1 capsule by mouth daily. , Disp: , Rfl:  .  clotrimazole (MYCELEX) 10 MG troche, Take 1 tablet by mouth 5 (five) times daily as needed., Disp: , Rfl:  .  furosemide (LASIX) 20 MG tablet, Take 20 mg by mouth daily. , Disp: , Rfl:  .  gabapentin (NEURONTIN) 600 MG tablet, 600 mg 3 (three) times daily. , Disp: , Rfl:  .  Glucose Blood (BLOOD GLUCOSE TEST STRIPS) STRP, 1 each by Other route as needed. Use as instructed, Disp: , Rfl:  .  glucose blood ([employer] TEST STRIPS) test strip, Check sugars four times daily.  On insulin.  E11.65, Disp: , Rfl:  .  [START ON 05/25/2017] HYDROcodone-acetaminophen (NORCO/VICODIN) 5-325 MG tablet, Take 1 tablet by mouth every 6 (six) hours as needed for severe pain., Disp: 120 tablet, Rfl: 0 .  HYDROcodone-acetaminophen (NORCO/VICODIN) 5-325 MG tablet, Take 1 tablet by mouth every 6 (six) hours as needed  for severe pain., Disp: 120 tablet, Rfl: 0 .  insulin NPH-regular Human (NOVOLIN 70/30) (70-30) 100 UNIT/ML injection, INJECT 74 UNITS SQ 30 MINUTES BEFORE BREAKFAST AND 74 UNITS 30 MINS BEFORE SUPPER, Disp: , Rfl:  .  insulin regular (NOVOLIN R,HUMULIN R) 100 units/mL injection, SSI: 151-200: 2 units, 201-250: 4 units, 251-300:6 units, 301-350:8 units, 351-400: 10 units > 400 call MD, Disp: , Rfl:  .  Lancets (ACCU-CHEK MULTICLIX) lancets, 1 each., Disp: , Rfl:  .  mirtazapine (REMERON) 15 MG tablet, Take 15 mg by mouth at bedtime. , Disp: , Rfl:  .  montelukast (SINGULAIR) 10 MG tablet, Take 10 mg by mouth daily. , Disp: , Rfl:  .  [START ON 05/25/2017] morphine (MS CONTIN) 30 MG 12 hr tablet, Take 1 tablet (30 mg total) by mouth every 8 (eight) hours as needed for pain., Disp: 90 tablet, Rfl: 0 .  morphine (MS CONTIN) 30 MG 12 hr tablet, Take 1  tablet (30 mg total) by mouth every 8 (eight) hours as needed for pain., Disp: 90 tablet, Rfl: 0 .  oxybutynin (DITROPAN XL) 10 MG 24 hr tablet, Take 10 mg by mouth daily. , Disp: , Rfl:  .  tiZANidine (ZANAFLEX) 4 MG tablet, Take 4 mg by mouth every 6 (six) hours. , Disp: , Rfl:  .  triamcinolone cream (KENALOG) 0.5 %, Apply 1 application topically 2 (two) times daily. , Disp: , Rfl:  .  vitamin B-12 (CYANOCOBALAMIN) 1000 MCG tablet, 1,000 mcg. Take 1,000 mcg by mouth daily., Disp: , Rfl:  .  [START ON 06/24/2017] HYDROcodone-acetaminophen (NORCO/VICODIN) 5-325 MG tablet, Take 1 tablet by mouth every 6 (six) hours as needed for severe pain., Disp: 120 tablet, Rfl: 0 .  [START ON 06/24/2017] morphine (MS CONTIN) 30 MG 12 hr tablet, Take 1 tablet (30 mg total) by mouth every 8 (eight) hours as needed for pain., Disp: 90 tablet, Rfl: 0  ROS  Constitutional: Denies any fever or chills Gastrointestinal: No reported hemesis, hematochezia, vomiting, or acute GI distress Musculoskeletal: Denies any acute onset joint swelling, redness, loss of ROM, or weakness Neurological: No reported episodes of acute onset apraxia, aphasia, dysarthria, agnosia, amnesia, paralysis, loss of coordination, or loss of consciousness  Allergies  Ms. Stare is allergic to augmentin [amoxicillin-pot clavulanate]; sulfa antibiotics; and mobic [meloxicam].  [place]  Drug: Ms. Limon  reports that she does not use drugs. Alcohol:  reports that she does not drink alcohol. Tobacco:  reports that she has quit smoking. She has never used smokeless tobacco. Medical:  has a past medical history of Allergy, Arthritis, Asthma, Chronic kidney disease, Depression, Diabetes mellitus without complication ([place]), Hypertension, Multiple sclerosis ([place]), Neuromuscular disorder ([place]), and Sleep apnea. Surgical: Ms. Swords  has a past surgical history that includes Cesarean section (1991 and 1996). Family: family history includes Dementia in  her mother; Depression in her mother; Diabetes in her mother; Hyperlipidemia in her mother; Stroke in her father.  Constitutional Exam  General appearance: Well nourished, well developed, and well hydrated. In no apparent acute distress Vitals:   04/24/17 1334  BP: 129/86  Pulse: 86  Resp: 16  Temp: 98.6 F (37 C)  SpO2: 98%  Weight: (!) 315 lb (142.9 kg)  Height: _0  (1.575 m)   BMI Assessment: Estimated body mass index is 57.61 kg/m as calculated from the following:   Height as of this encounter: _1  (1.575 m).   Weight as of this encounter: 315 lb (142.9 kg). Psych/Mental status:  Alert, oriented x 3 (person, place, & time)       Eyes: PERLA Respiratory: No evidence of acute respiratory distress  Cervical Spine Area Exam  Skin & Axial Inspection: No masses, redness, edema, swelling, or associated skin lesions Alignment: Symmetrical Functional ROM: Unrestricted ROM      Stability: No instability detected Muscle Tone/Strength: Functionally intact. No obvious neuro-muscular anomalies detected. Sensory (Neurological): Unimpaired Palpation: No palpable anomalies              Upper Extremity (UE) Exam    Side: Right upper extremity  Side: Left upper extremity  Skin & Extremity Inspection: Skin color, temperature, and hair growth are WNL. No peripheral edema or cyanosis. No masses, redness, swelling, asymmetry, or associated skin lesions. No contractures.  Skin & Extremity Inspection: Skin color, temperature, and hair growth are WNL. No peripheral edema or cyanosis. No masses, redness, swelling, asymmetry, or associated skin lesions. No contractures.  Functional ROM: Unrestricted ROM          Functional ROM: Unrestricted ROM          Muscle Tone/Strength: Functionally intact. No obvious neuro-muscular anomalies detected.  Muscle Tone/Strength: Functionally intact. No obvious neuro-muscular anomalies detected.  Sensory (Neurological): Unimpaired          Sensory (Neurological):  Unimpaired          Palpation: No palpable anomalies              Palpation: No palpable anomalies              Specialized Test(s): Deferred         Specialized Test(s): Deferred          Thoracic Spine Area Exam  Skin & Axial Inspection: No masses, redness, or swelling Alignment: Symmetrical Functional ROM: Unrestricted ROM Stability: No instability detected Muscle Tone/Strength: Functionally intact. No obvious neuro-muscular anomalies detected. Sensory (Neurological): Unimpaired Muscle strength & Tone: No palpable anomalies  Lumbar Spine Area Exam  Skin & Axial Inspection: No masses, redness, or swelling Alignment: Symmetrical Functional ROM: Unrestricted ROM      Stability: No instability detected Muscle Tone/Strength: Functionally intact. No obvious neuro-muscular anomalies detected. Sensory (Neurological): Unimpaired Palpation: No palpable anomalies       Provocative Tests: Lumbar Hyperextension and rotation test: evaluation deferred today       Lumbar Lateral bending test: evaluation deferred today       Patrick's Maneuver: evaluation deferred today                    Gait & Posture Assessment  Ambulation: Patient ambulates using a wheel chair Gait: Relatively normal for age and body habitus Posture: WNL   Lower Extremity Exam    Side: Right lower extremity  Side: Left lower extremity  Skin & Extremity Inspection: Skin color, temperature, and hair growth are WNL. No peripheral edema or cyanosis. No masses, redness, swelling, asymmetry, or associated skin lesions. No contractures.  Skin & Extremity Inspection: Skin color, temperature, and hair growth are WNL. No peripheral edema or cyanosis. No masses, redness, swelling, asymmetry, or associated skin lesions. No contractures.  Functional ROM: Unrestricted ROM          Functional ROM: Unrestricted ROM          Muscle Tone/Strength: Functionally intact. No obvious neuro-muscular anomalies detected.  Muscle Tone/Strength:  Functionally intact. No obvious neuro-muscular anomalies detected.  Sensory (Neurological): Unimpaired  Sensory (Neurological): Unimpaired  Palpation: No palpable anomalies  Palpation: No palpable anomalies  Assessment  Primary Diagnosis & Pertinent Problem List: The primary encounter diagnosis was Arthropathy of left hip (Severe chronic left hip DJD). Diagnoses of Lumbar facet syndrome, Multiple sclerosis ([place]), Fibromyalgia, Chronic pain syndrome, and Long term prescription opiate use were also pertinent to this visit.  Status Diagnosis  Controlled Controlled Controlled 1. Arthropathy of left hip (Severe chronic left hip DJD)   2. Lumbar facet syndrome   3. Multiple sclerosis ([place])   4. Fibromyalgia   5. Chronic pain syndrome   6. Long term prescription opiate use     Problems updated and reviewed during this visit: No problems updated. Plan of Care  Pharmacotherapy (Medications Ordered): Meds ordered this encounter  Medications  . morphine (MS CONTIN) 30 MG 12 hr tablet    Sig: Take 1 tablet (30 mg total) by mouth every 8 (eight) hours as needed for pain.    Dispense:  90 tablet    Refill:  0    Do not place this medication, or any other prescription from our practice, on "Automatic Refill". Patient may have prescription filled one day early if pharmacy is closed on scheduled refill date. Do not fill until: 06/24/2017 To last until:07/24/2017    Order Specific Question:   Supervising Provider    Answer:   Milinda Pointer [phone]  . morphine (MS CONTIN) 30 MG 12 hr tablet    Sig: Take 1 tablet (30 mg total) by mouth every 8 (eight) hours as needed for pain.    Dispense:  90 tablet    Refill:  0    Do not place this medication, or any other prescription from our practice, on "Automatic Refill". Patient may have prescription filled one day early if pharmacy is closed on scheduled refill date. Do not fill until: 05/25/2017 To last until: 06/24/2017    Order Specific Question:    Supervising Provider    Answer:   Milinda Pointer [phone]  . morphine (MS CONTIN) 30 MG 12 hr tablet    Sig: Take 1 tablet (30 mg total) by mouth every 8 (eight) hours as needed for pain.    Dispense:  90 tablet    Refill:  0    Do not place this medication, or any other prescription from our practice, on "Automatic Refill". Patient may have prescription filled one day early if pharmacy is closed on scheduled refill date. Do not fill until:04/25/2017 To last until: 05/25/2017    Order Specific Question:   Supervising Provider    Answer:   Milinda Pointer [phone]  . HYDROcodone-acetaminophen (NORCO/VICODIN) 5-325 MG tablet    Sig: Take 1 tablet by mouth every 6 (six) hours as needed for severe pain.    Dispense:  120 tablet    Refill:  0    Do not place this medication, or any other prescription from our practice, on "Automatic Refill". Patient may have prescription filled one day early if pharmacy is closed on scheduled refill date. Do not fill until:6/16/2019To last until: 07/24/2017    Order Specific Question:   Supervising Provider    Answer:   Milinda Pointer [phone]  . HYDROcodone-acetaminophen (NORCO/VICODIN) 5-325 MG tablet    Sig: Take 1 tablet by mouth every 6 (six) hours as needed for severe pain.    Dispense:  120 tablet    Refill:  0    Do not place this medication, or any other prescription from our practice, on "Automatic Refill". Patient may have prescription filled one day early if pharmacy is closed  on scheduled refill date. Do not fill until: 05/25/2017 To last until: 06/24/2017    Order Specific Question:   Supervising Provider    Answer:   Milinda Pointer [phone]  . HYDROcodone-acetaminophen (NORCO/VICODIN) 5-325 MG tablet    Sig: Take 1 tablet by mouth every 6 (six) hours as needed for severe pain.    Dispense:  120 tablet    Refill:  0    Do not place this medication, or any other prescription from our practice, on "Automatic Refill". Patient may have  prescription filled one day early if pharmacy is closed on scheduled refill date. Do not fill until:04/25/2017 To last until: 05/25/2017    Order Specific Question:   Supervising Provider    Answer:   Milinda Pointer [phone]   New Prescriptions   No medications on file   Medications administered today: Thy A. Heiney had no medications administered during this visit. Lab-work, procedure(s), and/or referral(s): Orders Placed This Encounter  Procedures  . ToxASSURE Select 13 (MW), Urine   Imaging and/or referral(s): None   Interventional therapies: Planned, scheduled, and/or pending: Not at this time.   Considering: Not at this time.   Palliative PRN treatment(s): Not at this time.     Provider-requested follow-up: Return in about 3 months (around 07/24/2017) for MedMgmt with Me Donella Stade [facility] Pace).  Future Appointments  Date Time Provider [place]  07/24/2017  1:45 PM Vevelyn Francois, NP [place] [facility]   Primary Care Physician: Hortencia Pilar, MD Location: [facility] Note by: Vevelyn Francois NP Date: 04/24/2017; Time: 8:34 AM  Pain Score Disclaimer: We use the NRS-11 scale. This is a self-reported, subjective measurement of pain severity with only modest accuracy. It is used primarily to identify changes within a particular patient. It must be understood that outpatient pain scales are significantly less accurate that those used for research, where they can be applied under ideal controlled circumstances with minimal exposure to variables. In reality, the score is likely to be a combination of pain intensity and pain affect, where pain affect describes the degree of emotional arousal or changes in action readiness caused by the sensory experience of pain. Factors such as social and work situation, setting, emotional state, anxiety levels, expectation, and prior pain experience may influence pain perception and show large  inter-individual differences that may also be affected by time variables.  Patient instructions provided during this appointment: Patient Instructions   __You have been given 3 prescriptions today for Hydrocodone and 3 prescriptions for Morphine    __________________________________________________________________________________________  Medication Rules  Applies to: All patients receiving prescriptions (written or electronic).  Pharmacy of record: Pharmacy where electronic prescriptions will be sent. If written prescriptions are taken to a different pharmacy, please inform the nursing staff. The pharmacy listed in the electronic medical record should be the one where you would like electronic prescriptions to be sent.  Prescription refills: Only during scheduled appointments. Applies to both, written and electronic prescriptions.  NOTE: The following applies primarily to controlled substances (Opioid* Pain Medications).   Patient's responsibilities: 1. Pain Pills: Bring all pain pills to every appointment (except for procedure appointments). 2. Pill Bottles: Bring pills in original pharmacy bottle. Always bring newest bottle. Bring bottle, even if empty. 3. Medication refills: You are responsible for knowing and keeping track of what medications you need refilled. The day before your appointment, write a list of all prescriptions that need to be refilled. Bring that list to your appointment and give it  to the admitting nurse. Prescriptions will be written only during appointments. If you forget a medication, it will not be "Called in", "Faxed", or "electronically sent". You will need to get another appointment to get these prescribed. 4. Prescription Accuracy: You are responsible for carefully inspecting your prescriptions before leaving our office. Have the discharge nurse carefully go over each prescription with you, before taking them home. Make sure that your name is accurately spelled,  that your address is correct. Check the name and dose of your medication to make sure it is accurate. Check the number of pills, and the written instructions to make sure they are clear and accurate. Make sure that you are given enough medication to last until your next medication refill appointment. 5. Taking Medication: Take medication as prescribed. Never take more pills than instructed. Never take medication more frequently than prescribed. Taking less pills or less frequently is permitted and encouraged, when it comes to controlled substances (written prescriptions).  6. Inform other Doctors: Always inform, all of your healthcare providers, of all the medications you take. 7. Pain Medication from other Providers: You are not allowed to accept any additional pain medication from any other Doctor or Healthcare provider. There are two exceptions to this rule. (see below) In the event that you require additional pain medication, you are responsible for notifying us, as stated below. 8. Medication Agreement: You are responsible for carefully reading and following our Medication Agreement. This must be signed before receiving any prescriptions from our practice. Safely store a copy of your signed Agreement. Violations to the Agreement will result in no further prescriptions. (Additional copies of our Medication Agreement are available upon request.) 9. Laws, Rules, & Regulations: All patients are expected to follow all Federal and [employer], Rules, [employer]. Ignorance of the Laws does not constitute a valid excuse. The use of any illegal substances is prohibited. 10. Adopted CDC guidelines & recommendations: Target dosing levels will be at or below 60 MME/day. Use of benzodiazepines** is not recommended.  Exceptions: There are only two exceptions to the rule of not receiving pain medications from other Healthcare Providers. 1. Exception #1 (Emergencies): In the event of an emergency (i.e.:  accident requiring emergency care), you are allowed to receive additional pain medication. However, you are responsible for: As soon as you are able, call our office [phone], at any time of the day or night, and leave a message stating your name, the date and nature of the emergency, and the name and dose of the medication prescribed. In the event that your call is answered by a member of our staff, make sure to document and save the date, time, and the name of the person that took your information.  2. Exception #2 (Planned Surgery): In the event that you are scheduled by another doctor or dentist to have any type of surgery or procedure, you are allowed (for a period no longer than 30 days), to receive additional pain medication, for the acute post-op pain. However, in this case, you are responsible for picking up a copy of our "Post-op Pain Management for Surgeons" handout, and giving it to your surgeon or dentist. This document is available at our office, and does not require an appointment to obtain it. Simply go to our office during business hours (Monday-Thursday from 8:00 AM to 4:00 PM) (Friday 8:00 AM to 12:00 Noon) or if you have a scheduled appointment with [place], prior to your surgery, and ask for it by  name. In addition, you will need to provide [place] with your name, name of your surgeon, type of surgery, and date of procedure or surgery.  *Opioid medications include: morphine, codeine, oxycodone, oxymorphone, hydrocodone, hydromorphone, meperidine, tramadol, tapentadol, buprenorphine, fentanyl, methadone. **Benzodiazepine medications include: diazepam (Valium), alprazolam (Xanax), clonazepam (Klonopine), lorazepam (Ativan), clorazepate (Tranxene), chlordiazepoxide (Librium), estazolam (Prosom), oxazepam (Serax), temazepam (Restoril), triazolam (Halcion) (Last updated: 03/08/2017) ____________________________________________________________________________________________    BMI Assessment:  Estimated body mass index is 57.61 kg/m as calculated from the following:   Height as of this encounter: _0  (1.575 m).   Weight as of this encounter: 315 lb (142.9 kg).  BMI interpretation table: BMI level Category Range association with higher incidence of chronic pain  <18 kg/m2 Underweight   18.5-24.9 kg/m2 Ideal body weight   25-29.9 kg/m2 Overweight Increased incidence by 20%  30-34.9 kg/m2 Obese (Class I) Increased incidence by 68%  35-39.9 kg/m2 Severe obesity (Class II) Increased incidence by 136%  >40 kg/m2 Extreme obesity (Class III) Increased incidence by 254%   BMI Readings from Last 4 Encounters:  04/24/17 57.61 kg/m  02/01/17 57.61 kg/m  11/01/16 55.79 kg/m  08/02/16 57.61 kg/m   Wt Readings from Last 4 Encounters:  04/24/17 (!) 315 lb (142.9 kg)  02/01/17 (!) 315 lb (142.9 kg)  11/01/16 (!) 305 lb (138.3 kg)  08/02/16 (!) 315 lb (142.9 kg)

## 2017-04-24 NOTE — Progress Notes (Signed)
Nursing Pain Medication Assessment:  Safety precautions to be maintained throughout the outpatient stay will include: orient to surroundings, keep bed in low position, maintain call bell within reach at all times, provide assistance with transfer out of bed and ambulation.  Medication Inspection Compliance: Pill count conducted under aseptic conditions, in front of the patient. Neither the pills nor the bottle was removed from the patient's sight at any time. Once count was completed pills were immediately returned to the patient in their original bottle.  Medication #1: Oxycodone IR Pill/Patch Count: 10 of 120 pills remain Pill/Patch Appearance: Markings consistent with prescribed medication Bottle Appearance: Standard pharmacy container. Clearly labeled. Filled Date: 03/ 19/ 2019 Last Medication intake:  Today  Medication #2: Morphine IR Pill/Patch Count: 8 of 90 pills remain Pill/Patch Appearance: Markings consistent with prescribed medication Bottle Appearance: Standard pharmacy container. Clearly labeled. Filled Date: 03 / 18 / 2019 Last Medication intake:  TodaySafety precautions to be maintained throughout the outpatient stay will include: orient to surroundings, keep bed in low position, maintain call bell within reach at all times, provide assistance with transfer out of bed and ambulation.

## 2017-04-26 ENCOUNTER — Encounter: Payer: Medicare HMO | Admitting: Nurse Practitioner

## 2017-04-28 LAB — TOXASSURE SELECT 13 (MW), URINE

## 2017-07-24 ENCOUNTER — Ambulatory Visit: Payer: BLUE CROSS/BLUE SHIELD | Attending: Nurse Practitioner | Admitting: Nurse Practitioner

## 2017-07-24 ENCOUNTER — Other Ambulatory Visit: Payer: Self-pay

## 2017-07-24 ENCOUNTER — Encounter: Payer: Self-pay | Admitting: Nurse Practitioner

## 2017-07-24 VITALS — BP 127/76 | HR 95 | Temp 98.7°F | Resp 18 | Ht 62.0 in | Wt 315.0 lb

## 2017-07-24 DIAGNOSIS — K219 Gastro-esophageal reflux disease without esophagitis: Secondary | ICD-10-CM | POA: Insufficient documentation

## 2017-07-24 DIAGNOSIS — E7800 Pure hypercholesterolemia, unspecified: Secondary | ICD-10-CM | POA: Insufficient documentation

## 2017-07-24 DIAGNOSIS — M47816 Spondylosis without myelopathy or radiculopathy, lumbar region: Secondary | ICD-10-CM | POA: Insufficient documentation

## 2017-07-24 DIAGNOSIS — Z882 Allergy status to sulfonamides status: Secondary | ICD-10-CM | POA: Insufficient documentation

## 2017-07-24 DIAGNOSIS — I129 Hypertensive chronic kidney disease with stage 1 through stage 4 chronic kidney disease, or unspecified chronic kidney disease: Secondary | ICD-10-CM | POA: Insufficient documentation

## 2017-07-24 DIAGNOSIS — G8929 Other chronic pain: Secondary | ICD-10-CM

## 2017-07-24 DIAGNOSIS — Z87891 Personal history of nicotine dependence: Secondary | ICD-10-CM | POA: Insufficient documentation

## 2017-07-24 DIAGNOSIS — N183 Chronic kidney disease, stage 3 (moderate): Secondary | ICD-10-CM | POA: Diagnosis not present

## 2017-07-24 DIAGNOSIS — E559 Vitamin D deficiency, unspecified: Secondary | ICD-10-CM | POA: Diagnosis not present

## 2017-07-24 DIAGNOSIS — M1612 Unilateral primary osteoarthritis, left hip: Secondary | ICD-10-CM | POA: Insufficient documentation

## 2017-07-24 DIAGNOSIS — G35 Multiple sclerosis: Secondary | ICD-10-CM | POA: Diagnosis not present

## 2017-07-24 DIAGNOSIS — M25552 Pain in left hip: Secondary | ICD-10-CM | POA: Diagnosis not present

## 2017-07-24 DIAGNOSIS — Z79899 Other long term (current) drug therapy: Secondary | ICD-10-CM | POA: Insufficient documentation

## 2017-07-24 DIAGNOSIS — M797 Fibromyalgia: Secondary | ICD-10-CM | POA: Insufficient documentation

## 2017-07-24 DIAGNOSIS — M5441 Lumbago with sciatica, right side: Secondary | ICD-10-CM | POA: Diagnosis not present

## 2017-07-24 DIAGNOSIS — Z7982 Long term (current) use of aspirin: Secondary | ICD-10-CM | POA: Diagnosis not present

## 2017-07-24 DIAGNOSIS — Z5181 Encounter for therapeutic drug level monitoring: Secondary | ICD-10-CM | POA: Diagnosis present

## 2017-07-24 DIAGNOSIS — E1122 Type 2 diabetes mellitus with diabetic chronic kidney disease: Secondary | ICD-10-CM | POA: Insufficient documentation

## 2017-07-24 DIAGNOSIS — G4733 Obstructive sleep apnea (adult) (pediatric): Secondary | ICD-10-CM | POA: Diagnosis not present

## 2017-07-24 DIAGNOSIS — Z794 Long term (current) use of insulin: Secondary | ICD-10-CM | POA: Insufficient documentation

## 2017-07-24 DIAGNOSIS — Z880 Allergy status to penicillin: Secondary | ICD-10-CM | POA: Diagnosis not present

## 2017-07-24 DIAGNOSIS — E538 Deficiency of other specified B group vitamins: Secondary | ICD-10-CM | POA: Diagnosis not present

## 2017-07-24 DIAGNOSIS — G894 Chronic pain syndrome: Secondary | ICD-10-CM | POA: Diagnosis not present

## 2017-07-24 DIAGNOSIS — Z888 Allergy status to other drugs, medicaments and biological substances status: Secondary | ICD-10-CM | POA: Diagnosis not present

## 2017-07-24 DIAGNOSIS — M25562 Pain in left knee: Secondary | ICD-10-CM | POA: Diagnosis present

## 2017-07-24 MED ORDER — MORPHINE SULFATE ER 30 MG PO TBCR: 30.0000 mg | EXTENDED_RELEASE_TABLET | Freq: Three times a day (TID) | ORAL | 0 refills | Status: DC | PRN

## 2017-07-24 MED ORDER — HYDROCODONE-ACETAMINOPHEN 5-325 MG PO TABS: 1.0000 | ORAL_TABLET | Freq: Four times a day (QID) | ORAL | 0 refills | Status: DC | PRN

## 2017-07-24 NOTE — Progress Notes (Signed)
Patient's Name: Tiffany Herring  MRN: [id number]  Referring Provider: Hortencia Pilar, MD  DOB: [birth year]  PCP: Hortencia Pilar, MD  DOS: 07/24/2017  Note by: Vevelyn Francois NP  Service setting: Ambulatory outpatient  Specialty: Interventional Pain Management  Location: [facility] (AMB) Pain Management Facility    Patient type: Established    Primary Reason(s) for Visit: Encounter for prescription drug management. (Level of risk: moderate)  CC: Hip Pain (left) and Knee Pain (left)  HPI  Ms. Hare is a 57 y.o. year old, female patient, who comes today for a medication management evaluation. She has Failed back surgical syndrome; Epidural fibrosis; Chronic bilateral low back pain with right-sided sciatica; Chronic lumbar radicular pain (Right); Long term current use of opiate analgesic; Long term prescription opiate use; Opiate use (80 MME/Day); Opiate dependence ([place]); Encounter for therapeutic drug level monitoring; Allergic rhinitis; Carotid artery bruit; Chronic kidney disease (CKD), stage III (moderate) ([place]); Colon polyp; Diabetes mellitus ([place]); Dysthymia; Essential (primary) hypertension; Generalized OA; Acid reflux; History of intermenstrual bleeding; History of migraine; H/O nutritional disorder; Hypercholesterolemia; Type 2 diabetes mellitus treated with insulin ([place]); Other long term (current) drug therapy; Menopausal and perimenopausal disorder; Class III Morbid obesity ([place]) (254% higher incidence of chronic low back pain); Multiple sclerosis ([place]); Obstructive apnea; Degenerative arthritis of hip; Awareness of heartbeats; History of colon polyps; Post menopausal syndrome; Pure hypercholesterolemia; B12 deficiency; Fibromyalgia; Lumbar spondylosis; Grade 1 Anterolisthesis of L4 over L5; Chronic sacroiliac joint pain (bilateral); Chronic hip pain (Left); Arthropathy of left hip (Severe chronic left hip DJD); Osteoarthritis of hip (Left); Lumbar facet arthropathy ([place]); Lumbar facet hypertrophy;  Lumbar facet syndrome; Chronic flank pain (Right); Chronic lower extremity pain (Right); Breast cancer screening; Chronic pain syndrome; Morbid obesity with BMI of 50.0-59.9, adult ([place]); Post-menopausal; Venous stasis ulcer ([place]); Vitamin D deficiency; GERD (gastroesophageal reflux disease); OA (osteoarthritis); Palpitations; Vitamin B12 deficiency; Carotid bruit; CKD (chronic kidney disease); Osteoarthrosis, hip; Type 2 diabetes mellitus with stage 3 chronic kidney disease, with long-term current use of insulin ([place]); Long term use of drug; Splenomegaly; Essential hypertension with goal blood pressure less than 140/90; Generalized osteoarthritis; History of colonic polyps; History of irregular menstrual bleeding; Menopausal and postmenopausal disorder; and Obstructive sleep apnea on their problem list. Her primarily concern today is the Hip Pain (left) and Knee Pain (left)  Pain Assessment: Location:   Hip Radiating: right knee Onset: More than a month ago Duration: Chronic pain Quality: Sharp, Constant Severity: 4 /10 (subjective, self-reported pain score)  Note: Reported level is compatible with observation.                          Timing: Constant Modifying factors: medication, rest, ice BP: 127/76  HR: 95  Ms. Schmaltz was last scheduled for an appointment on 04/24/2017 for medication management. During today's appointment we reviewed Ms. Sanford's chronic pain status, as well as her outpatient medication regimen. She admits that her pain is unchanged. She denies any concerns today. Her pain is stable.   The patient  reports that she does not use drugs. Her body mass index is 57.61 kg/m.  Further details on both, my assessment(s), as well as the proposed treatment plan, please see below.  Controlled Substance Pharmacotherapy Assessment REMS (Risk Evaluation and Mitigation Strategy)   Analgesic:Morphine ER 30 mg every 8 hours (90 mg/day) + hydrocodone/APAP 5/325 one every 6 hours (20  mg/dayof hydrocodone) MME/day:18m/day.   SHart Rochester RN  07/24/2017  1:44 PM  Sign at close encounter Nursing Pain Medication Assessment:  Safety precautions to be maintained throughout the outpatient stay will include: orient to surroundings, keep bed in low position, maintain call bell within reach at all times, provide assistance with transfer out of bed and ambulation.  Medication Inspection Compliance: Pill count conducted under aseptic conditions, in front of the patient. Neither the pills nor the bottle was removed from the patient's sight at any time. Once count was completed pills were immediately returned to the patient in their original bottle.  Medication #1: Hydrocodone/APAP Pill/Patch Count: 117 of 120 pills remain Pill/Patch Appearance: Markings consistent with prescribed medication Bottle Appearance: Standard pharmacy container. Clearly labeled. Filled Date: 07 / 15 / 2019 Last Medication intake:  Today  Medication #2: Morphine ER (MSContin) Pill/Patch Count: 87 of 90 pills remain Pill/Patch Appearance: Markings consistent with prescribed medication Bottle Appearance: Standard pharmacy container. Clearly labeled. Filled Date: 07 / 15 / 2019 Last Medication intake:  Today   Pharmacokinetics: Liberation and absorption (onset of action): WNL Distribution (time to peak effect): WNL Metabolism and excretion (duration of action): WNL         Pharmacodynamics: Desired effects: Analgesia: Ms. Larose reports >50% benefit. Functional ability: Patient reports that medication allows her to accomplish basic ADLs Clinically meaningful improvement in function (CMIF): Sustained CMIF goals met Perceived effectiveness: Described as relatively effective, allowing for increase in activities of daily living (ADL) Undesirable effects: Side-effects or Adverse reactions: None reported Monitoring: [place] PMP: Online review of the past 51-monthperiod conducted. Compliant with  practice rules and regulations Last UDS on record: Summary  Date Value Ref Range Status  04/24/2017 FINAL  Final    Comment:    ==================================================================== TOXASSURE SELECT 13 (MW) ==================================================================== Test                             Result       Flag       Units Drug Present and Declared for Prescription Verification   Morphine                       >>02774      EXPECTED   ng/mg creat   Normorphine                    264          EXPECTED   ng/mg creat    Potential sources of large amounts of morphine in the absence of    codeine include administration of morphine or use of heroin.    Normorphine is an expected metabolite of morphine.   Hydrocodone                    1079         EXPECTED   ng/mg creat   Hydromorphone                  99           EXPECTED   ng/mg creat   Norhydrocodone                 1047         EXPECTED   ng/mg creat    Sources of hydrocodone include scheduled prescription    medications. Hydromorphone and norhydrocodone are expected    metabolites of hydrocodone. Hydromorphone is also available as a    scheduled prescription medication. ====================================================================  Test                      Result    Flag   Units      Ref Range   Creatinine              81               mg/dL      >=20 ==================================================================== Declared Medications:  The flagging and interpretation on this report are based on the  following declared medications.  Unexpected results may arise from  inaccuracies in the declared medications.  **Note: The testing scope of this panel includes these medications:  Hydrocodone (Hydrocodone-Acetaminophen)  Morphine  **Note: The testing scope of this panel does not include following  reported medications:  Acetaminophen (Hydrocodone-Acetaminophen)  Albuterol  Aspirin (Aspirin  81)  Bupropion  Cholecalciferol  Clotrimazole  Cyanocobalamin  Furosemide  Gabapentin  Insulin  Mirtazapine  Montelukast  Oxybutynin  Tizanidine  Triamcinolone ==================================================================== For clinical consultation, please call [phone]. ====================================================================    UDS interpretation: Compliant          Medication Assessment Form: Reviewed. Patient indicates being compliant with therapy Treatment compliance: Compliant Risk Assessment Profile: Aberrant behavior: See prior evaluations. None observed or detected today Comorbid factors increasing risk of overdose: See prior notes. No additional risks detected today Risk of substance use disorder (SUD): Low Opioid Risk Tool - 07/24/17 1341      Family History of Substance Abuse   Alcohol  Negative    Illegal Drugs  Negative    Rx Drugs  Negative      Personal History of Substance Abuse   Alcohol  Negative    Illegal Drugs  Negative    Rx Drugs  Negative      Age   Age between 51-45 years   No      History of Preadolescent Sexual Abuse   History of Preadolescent Sexual Abuse  Negative or Female      Psychological Disease   Psychological Disease  Negative    Depression  Positive      Total Score   Opioid Risk Tool Scoring  1    Opioid Risk Interpretation  Low Risk      ORT Scoring interpretation table:  Score <3 = Low Risk for SUD  Score between 4-7 = Moderate Risk for SUD  Score >8 = High Risk for Opioid Abuse   Risk Mitigation Strategies:  Patient Counseling: Covered Patient-Prescriber Agreement (PPA): Present and active  Notification to other healthcare providers: Done  Pharmacologic Plan: No change in therapy, at this time.             Laboratory Chemistry  Inflammation Markers (CRP: Acute Phase) (ESR: Chronic Phase) Lab Results  Component Value Date   CRP 2.0 (H) 02/15/2015   ESRSEDRATE 28 02/15/2015                          Rheumatology Markers No results found for: RF, ANA, LABURIC, URICUR, LYMEIGGIGMAB, LYMEABIGMQN, HLAB27                      Renal Function Markers Lab Results  Component Value Date   BUN 27 (H) 02/15/2015   CREATININE 1.42 (H) 02/15/2015   GFRAA 47 (L) 02/15/2015   GFRNONAA 41 (L) 02/15/2015  Hepatic Function Markers Lab Results  Component Value Date   AST 16 02/15/2015   ALT 19 02/15/2015   ALBUMIN 4.3 02/15/2015   ALKPHOS 77 02/15/2015                        Electrolytes Lab Results  Component Value Date   NA 138 02/15/2015   K 4.8 02/15/2015   CL 102 02/15/2015   CALCIUM 9.4 02/15/2015   MG 2.0 02/15/2015                        Neuropathy Markers No results found for: VITAMINB12, FOLATE, HGBA1C, HIV                      Bone Pathology Markers No results found for: VD25OH, LK562BW3SLH, TD4287GO1, LX7262MB5, 25OHVITD1, 25OHVITD2, 25OHVITD3, TESTOFREE, TESTOSTERONE                       Coagulation Parameters No results found for: INR, LABPROT, APTT, PLT, DDIMER                      Cardiovascular Markers No results found for: BNP, CKTOTAL, CKMB, TROPONINI, HGB, HCT                       CA Markers No results found for: CEA, CA125, LABCA2                      Note: Lab results reviewed.  Recent Diagnostic Imaging Results   Complexity Note: Imaging results reviewed. Results shared with Ms. Minette Brine, using [employer].                         Meds   Current Outpatient Medications:  .  albuterol (PROAIR HFA) 108 (90 Base) MCG/ACT inhaler, Inhale 1 puff into the lungs as needed. , Disp: , Rfl:  .  aspirin 81 MG chewable tablet, Chew 81 mg by mouth daily., Disp: , Rfl:  .  B-D INS SYR ULTRAFINE 1CC/30G 30G X 1/2" 1 ML MISC, , Disp: , Rfl:  .  Blood Glucose Monitoring Suppl (FIFTY50 GLUCOSE METER 2.0) w/Device KIT, Check sugars three times daily.  On insulin.  E11.9, Disp: , Rfl:  .  buPROPion (WELLBUTRIN SR) 150 MG 12  hr tablet, Take 150 mg by mouth 2 (two) times daily. , Disp: , Rfl:  .  Cholecalciferol (VITAMIN D3) 5000 units CAPS, Take 1 capsule by mouth daily. , Disp: , Rfl:  .  clotrimazole (MYCELEX) 10 MG troche, Take 1 tablet by mouth 5 (five) times daily as needed., Disp: , Rfl:  .  furosemide (LASIX) 20 MG tablet, Take 20 mg by mouth daily. , Disp: , Rfl:  .  gabapentin (NEURONTIN) 600 MG tablet, 600 mg 3 (three) times daily. , Disp: , Rfl:  .  Glucose Blood (BLOOD GLUCOSE TEST STRIPS) STRP, 1 each by Other route as needed. Use as instructed, Disp: , Rfl:  .  glucose blood ([employer] TEST STRIPS) test strip, Check sugars four times daily.  On insulin.  E11.65, Disp: , Rfl:  .  [START ON 09/21/2017] HYDROcodone-acetaminophen (NORCO/VICODIN) 5-325 MG tablet, Take 1 tablet by mouth every 6 (six) hours as needed for severe pain., Disp: 120 tablet, Rfl: 0 .  insulin NPH-regular Human (NOVOLIN 70/30) (70-30) 100 UNIT/ML injection, INJECT  74 UNITS SQ 30 MINUTES BEFORE BREAKFAST AND 74 UNITS 30 MINS BEFORE SUPPER, Disp: , Rfl:  .  insulin regular (NOVOLIN R,HUMULIN R) 100 units/mL injection, SSI: 151-200: 2 units, 201-250: 4 units, 251-300:6 units, 301-350:8 units, 351-400: 10 units > 400 call MD, Disp: , Rfl:  .  Lancets (ACCU-CHEK MULTICLIX) lancets, 1 each., Disp: , Rfl:  .  mirtazapine (REMERON) 15 MG tablet, Take 15 mg by mouth at bedtime. , Disp: , Rfl:  .  montelukast (SINGULAIR) 10 MG tablet, Take 10 mg by mouth daily. , Disp: , Rfl:  .  [START ON 09/21/2017] morphine (MS CONTIN) 30 MG 12 hr tablet, Take 1 tablet (30 mg total) by mouth every 8 (eight) hours as needed for pain., Disp: 90 tablet, Rfl: 0 .  oxybutynin (DITROPAN XL) 10 MG 24 hr tablet, Take 10 mg by mouth daily. , Disp: , Rfl:  .  tiZANidine (ZANAFLEX) 4 MG tablet, Take 4 mg by mouth every 6 (six) hours. , Disp: , Rfl:  .  triamcinolone cream (KENALOG) 0.5 %, Apply 1 application topically 2 (two) times daily. , Disp: , Rfl:  .  vitamin B-12  (CYANOCOBALAMIN) 1000 MCG tablet, 1,000 mcg. Take 1,000 mcg by mouth daily., Disp: , Rfl:  .  [START ON 10/21/2017] HYDROcodone-acetaminophen (NORCO/VICODIN) 5-325 MG tablet, Take 1 tablet by mouth every 6 (six) hours as needed for severe pain., Disp: 120 tablet, Rfl: 0 .  [START ON 08/22/2017] HYDROcodone-acetaminophen (NORCO/VICODIN) 5-325 MG tablet, Take 1 tablet by mouth every 6 (six) hours as needed for severe pain., Disp: 120 tablet, Rfl: 0 .  [START ON 10/21/2017] morphine (MS CONTIN) 30 MG 12 hr tablet, Take 1 tablet (30 mg total) by mouth every 8 (eight) hours as needed for pain., Disp: 90 tablet, Rfl: 0 .  [START ON 08/22/2017] morphine (MS CONTIN) 30 MG 12 hr tablet, Take 1 tablet (30 mg total) by mouth every 8 (eight) hours as needed for pain., Disp: 90 tablet, Rfl: 0  ROS  Constitutional: Denies any fever or chills Gastrointestinal: No reported hemesis, hematochezia, vomiting, or acute GI distress Musculoskeletal: Denies any acute onset joint swelling, redness, loss of ROM, or weakness Neurological: No reported episodes of acute onset apraxia, aphasia, dysarthria, agnosia, amnesia, paralysis, loss of coordination, or loss of consciousness  Allergies  Ms. Tuller is allergic to augmentin [amoxicillin-pot clavulanate]; sulfa antibiotics; and mobic [meloxicam].  [place]  Drug: Ms. Glace  reports that she does not use drugs. Alcohol:  reports that she does not drink alcohol. Tobacco:  reports that she has quit smoking. She has never used smokeless tobacco. Medical:  has a past medical history of Allergy, Arthritis, Asthma, Chronic kidney disease, Depression, Diabetes mellitus without complication ([place]), Hypertension, Multiple sclerosis ([place]), Neuromuscular disorder ([place]), and Sleep apnea. Surgical: Ms. Zuckerman  has a past surgical history that includes Cesarean section (1991 and 1996). Family: family history includes Dementia in her mother; Depression in her mother; Diabetes in her  mother; Hyperlipidemia in her mother; Stroke in her father.  Constitutional Exam  General appearance: Well nourished, well developed, and well hydrated. In no apparent acute distress Vitals:   07/24/17 1336  BP: 127/76  Pulse: 95  Resp: 18  Temp: 98.7 F (37.1 C)  SpO2: 97%  Weight: (!) 315 lb (142.9 kg)  Height: 5' 2"  (1.575 m)  Psych/Mental status: Alert, oriented x 3 (person, place, & time)       Eyes: PERLA Respiratory: No evidence of acute respiratory distress  Gait & Posture Assessment  Ambulation: Patient ambulates using a wheel chair Gait: Relatively normal for age and body habitus Posture: WNL   Lower Extremity Exam    Side: Right lower extremity  Side: Left lower extremity  Stability: No instability observed          Stability: No instability observed          Skin & Extremity Inspection: Skin color, temperature, and hair growth are WNL. No peripheral edema or cyanosis. No masses, redness, swelling, asymmetry, or associated skin lesions. No contractures.  Skin & Extremity Inspection: Skin color, temperature, and hair growth are WNL. No peripheral edema or cyanosis. No masses, redness, swelling, asymmetry, or associated skin lesions. No contractures.  Functional ROM: Unrestricted ROM                  Functional ROM: Unrestricted ROM                  Muscle Tone/Strength: Functionally intact. No obvious neuro-muscular anomalies detected.  Muscle Tone/Strength: Functionally intact. No obvious neuro-muscular anomalies detected.  Sensory (Neurological): Unimpaired  Sensory (Neurological): Unimpaired  Palpation: No palpable anomalies  Palpation: No palpable anomalies   Assessment  Primary Diagnosis & Pertinent Problem List: The primary encounter diagnosis was Lumbar spondylosis. Diagnoses of Fibromyalgia, Chronic hip pain (Left), and Chronic pain syndrome were also pertinent to this visit.  Status Diagnosis  Controlled Controlled Controlled 1. Lumbar spondylosis   2.  Fibromyalgia   3. Chronic hip pain (Left)   4. Chronic pain syndrome     Problems updated and reviewed during this visit: No problems updated. Plan of Care  Pharmacotherapy (Medications Ordered): Meds ordered this encounter  Medications  . morphine (MS CONTIN) 30 MG 12 hr tablet    Sig: Take 1 tablet (30 mg total) by mouth every 8 (eight) hours as needed for pain.    Dispense:  90 tablet    Refill:  0    Do not place this medication, or any other prescription from our practice, on "Automatic Refill". Patient may have prescription filled one day early if pharmacy is closed on scheduled refill date. Do not fill until: 10/21/2017 To last until: 11/20/2017    Order Specific Question:   Supervising Provider    Answer:   Milinda Pointer [phone]  . morphine (MS CONTIN) 30 MG 12 hr tablet    Sig: Take 1 tablet (30 mg total) by mouth every 8 (eight) hours as needed for pain.    Dispense:  90 tablet    Refill:  0    Do not place this medication, or any other prescription from our practice, on "Automatic Refill". Patient may have prescription filled one day early if pharmacy is closed on scheduled refill date. Do not fill until: 09/21/2017 To last until:10/21/2017    Order Specific Question:   Supervising Provider    Answer:   Milinda Pointer [phone]  . morphine (MS CONTIN) 30 MG 12 hr tablet    Sig: Take 1 tablet (30 mg total) by mouth every 8 (eight) hours as needed for pain.    Dispense:  90 tablet    Refill:  0    Do not place this medication, or any other prescription from our practice, on "Automatic Refill". Patient may have prescription filled one day early if pharmacy is closed on scheduled refill date. Do not fill until:08/22/2017 To last until:09/21/2017    Order Specific Question:   Supervising Provider    Answer:  [place], [place] HYDROcodone-acetaminophen (NORCO/VICODIN) 5-325 MG tablet    Sig: Take 1 tablet by mouth every 6 (six) hours as needed for severe  pain.    Dispense:  120 tablet    Refill:  0    Do not place this medication, or any other prescription from our practice, on "Automatic Refill". Patient may have prescription filled one day early if pharmacy is closed on scheduled refill date. Do not fill until: 10/21/2017 To last until: 11/20/2017    Order Specific Question:   Supervising Provider    Answer:   Milinda Pointer [phone]  . HYDROcodone-acetaminophen (NORCO/VICODIN) 5-325 MG tablet    Sig: Take 1 tablet by mouth every 6 (six) hours as needed for severe pain.    Dispense:  120 tablet    Refill:  0    Do not place this medication, or any other prescription from our practice, on "Automatic Refill". Patient may have prescription filled one day early if pharmacy is closed on scheduled refill date. Do not fill until:09/21/2017 To last until: 10/21/2017    Order Specific Question:   Supervising Provider    Answer:   Milinda Pointer [phone]  . HYDROcodone-acetaminophen (NORCO/VICODIN) 5-325 MG tablet    Sig: Take 1 tablet by mouth every 6 (six) hours as needed for severe pain.    Dispense:  120 tablet    Refill:  0    Do not place this medication, or any other prescription from our practice, on "Automatic Refill". Patient may have prescription filled one day early if pharmacy is closed on scheduled refill date. Do not fill until:08/22/2017 To last until: 09/21/2017    Order Specific Question:   Supervising Provider    Answer:   Milinda Pointer [phone]   New Prescriptions   No medications on file   Medications administered today: Chanda A. Dorfman had no medications administered during this visit. Lab-work, procedure(s), and/or referral(s): No orders of the defined types were placed in this encounter.  Imaging and/or referral(s): None  Interventional therapies: Planned, scheduled, and/or pending: Not at this time.   Considering: Not at this time.   Palliative PRN treatment(s): Not at this time.    Provider-requested follow-up: Return in about 3 months (around 11/07/2017) for MedMgmt with Me Donella Stade [facility] P[facility]).  Future Appointments  Date Time Provider [place]  11/07/2017  1:30 PM Vevelyn Francois, NP [facility] None   Primary Care Physician: Hortencia Pilar, MD Location: [facility] Outpatient Pain Management Facility Note by: Vevelyn Francois NP Date: 07/24/2017; Time: 2:21 PM  Pain Score Disclaimer: We use the NRS-11 scale. This is a self-reported, subjective measurement of pain severity with only modest accuracy. It is used primarily to identify changes within a particular patient. It must be understood that outpatient pain scales are significantly less accurate that those used for research, where they can be applied under ideal controlled circumstances with minimal exposure to variables. In reality, the score is likely to be a combination of pain intensity and pain affect, where pain affect describes the degree of emotional arousal or changes in action readiness caused by the sensory experience of pain. Factors such as social and work situation, setting, emotional state, anxiety levels, expectation, and prior pain experience may influence pain perception and show large inter-individual differences that may also be affected by time variables.  Patient instructions provided during this appointment: Patient Instructions   ____________________________________________________________________________________________  Medication Rules  Applies to: All patients receiving prescriptions (written or electronic).  Pharmacy of  record: Pharmacy where electronic prescriptions will be sent. If written prescriptions are taken to a different pharmacy, please inform the nursing staff. The pharmacy listed in the electronic medical record should be the one where you would like electronic prescriptions to be sent.  Prescription refills: Only during scheduled appointments. Applies to both, written and  electronic prescriptions.  NOTE: The following applies primarily to controlled substances (Opioid* Pain Medications).   Patient's responsibilities: 1. Pain Pills: Bring all pain pills to every appointment (except for procedure appointments). 2. Pill Bottles: Bring pills in original pharmacy bottle. Always bring newest bottle. Bring bottle, even if empty. 3. Medication refills: You are responsible for knowing and keeping track of what medications you need refilled. The day before your appointment, write a list of all prescriptions that need to be refilled. Bring that list to your appointment and give it to the admitting nurse. Prescriptions will be written only during appointments. If you forget a medication, it will not be "Called in", "Faxed", or "electronically sent". You will need to get another appointment to get these prescribed. 4. Prescription Accuracy: You are responsible for carefully inspecting your prescriptions before leaving our office. Have the discharge nurse carefully go over each prescription with you, before taking them home. Make sure that your name is accurately spelled, that your address is correct. Check the name and dose of your medication to make sure it is accurate. Check the number of pills, and the written instructions to make sure they are clear and accurate. Make sure that you are given enough medication to last until your next medication refill appointment. 5. Taking Medication: Take medication as prescribed. Never take more pills than instructed. Never take medication more frequently than prescribed. Taking less pills or less frequently is permitted and encouraged, when it comes to controlled substances (written prescriptions).  6. Inform other Doctors: Always inform, all of your healthcare providers, of all the medications you take. 7. Pain Medication from other Providers: You are not allowed to accept any additional pain medication from any other Doctor or Healthcare  provider. There are two exceptions to this rule. (see below) In the event that you require additional pain medication, you are responsible for notifying us, as stated below. 8. Medication Agreement: You are responsible for carefully reading and following our Medication Agreement. This must be signed before receiving any prescriptions from our practice. Safely store a copy of your signed Agreement. Violations to the Agreement will result in no further prescriptions. (Additional copies of our Medication Agreement are available upon request.) 9. Laws, Rules, & Regulations: All patients are expected to follow all Federal and [employer], [employer], Rules, [employer]. Ignorance of the Laws does not constitute a valid excuse. The use of any illegal substances is prohibited. 10. Adopted CDC guidelines & recommendations: Target dosing levels will be at or below 60 MME/day. Use of benzodiazepines** is not recommended.  Exceptions: There are only two exceptions to the rule of not receiving pain medications from other Healthcare Providers. 1. Exception #1 (Emergencies): In the event of an emergency (i.e.: accident requiring emergency care), you are allowed to receive additional pain medication. However, you are responsible for: As soon as you are able, call our office [phone], at any time of the day or night, and leave a message stating your name, the date and nature of the emergency, and the name and dose of the medication prescribed. In the event that your call is answered by a member of our staff, make sure  to document and save the date, time, and the name of the person that took your information.  2. Exception #2 (Planned Surgery): In the event that you are scheduled by another doctor or dentist to have any type of surgery or procedure, you are allowed (for a period no longer than 30 days), to receive additional pain medication, for the acute post-op pain. However, in this case, you are responsible for  picking up a copy of our "Post-op Pain Management for Surgeons" handout, and giving it to your surgeon or dentist. This document is available at our office, and does not require an appointment to obtain it. Simply go to our office during business hours (Monday-Thursday from 8:00 AM to 4:00 PM) (Friday 8:00 AM to 12:00 Noon) or if you have a scheduled appointment with [place], prior to your surgery, and ask for it by name. In addition, you will need to provide [place] with your name, name of your surgeon, type of surgery, and date of procedure or surgery.  *Opioid medications include: morphine, codeine, oxycodone, oxymorphone, hydrocodone, hydromorphone, meperidine, tramadol, tapentadol, buprenorphine, fentanyl, methadone. **Benzodiazepine medications include: diazepam (Valium), alprazolam (Xanax), clonazepam (Klonopine), lorazepam (Ativan), clorazepate (Tranxene), chlordiazepoxide (Librium), estazolam (Prosom), oxazepam (Serax), temazepam (Restoril), triazolam (Halcion) (Last updated: 03/08/2017) ____________________________________________________________________________________________   BMI Assessment: Estimated body mass index is 57.61 kg/m as calculated from the following:   Height as of this encounter: 5' 2"  (1.575 m).   Weight as of this encounter: 315 lb (142.9 kg).  BMI interpretation table: BMI level Category Range association with higher incidence of chronic pain  <18 kg/m2 Underweight   18.5-24.9 kg/m2 Ideal body weight   25-29.9 kg/m2 Overweight Increased incidence by 20%  30-34.9 kg/m2 Obese (Class I) Increased incidence by 68%  35-39.9 kg/m2 Severe obesity (Class II) Increased incidence by 136%  >40 kg/m2 Extreme obesity (Class III) Increased incidence by 254%   Patient's current BMI Ideal Body weight  Body mass index is 57.61 kg/m. Ideal body weight: 50.1 kg (110 lb 7.2 oz) Adjusted ideal body weight: 87.2 kg (192 lb 4.3 oz)   BMI Readings from Last 4 Encounters:  07/24/17 57.61  kg/m  04/24/17 57.61 kg/m  02/01/17 57.61 kg/m  11/01/16 55.79 kg/m   Wt Readings from Last 4 Encounters:  07/24/17 (!) 315 lb (142.9 kg)  04/24/17 (!) 315 lb (142.9 kg)  02/01/17 (!) 315 lb (142.9 kg)  11/01/16 (!) 305 lb (138.3 kg)

## 2017-07-24 NOTE — Patient Instructions (Addendum)
____________________________________________________________________________________________  Medication Rules  Applies to: All patients receiving prescriptions (written or electronic).  Pharmacy of record: Pharmacy where electronic prescriptions will be sent. If written prescriptions are taken to a different pharmacy, please inform the nursing staff. The pharmacy listed in the electronic medical record should be the one where you would like electronic prescriptions to be sent.  Prescription refills: Only during scheduled appointments. Applies to both, written and electronic prescriptions.  NOTE: The following applies primarily to controlled substances (Opioid* Pain Medications).   Patient's responsibilities: 1. Pain Pills: Bring all pain pills to every appointment (except for procedure appointments). 2. Pill Bottles: Bring pills in original pharmacy bottle. Always bring newest bottle. Bring bottle, even if empty. 3. Medication refills: You are responsible for knowing and keeping track of what medications you need refilled. The day before your appointment, write a list of all prescriptions that need to be refilled. Bring that list to your appointment and give it to the admitting nurse. Prescriptions will be written only during appointments. If you forget a medication, it will not be "Called in", "Faxed", or "electronically sent". You will need to get another appointment to get these prescribed. 4. Prescription Accuracy: You are responsible for carefully inspecting your prescriptions before leaving our office. Have the discharge nurse carefully go over each prescription with you, before taking them home. Make sure that your name is accurately spelled, that your address is correct. Check the name and dose of your medication to make sure it is accurate. Check the number of pills, and the written instructions to make sure they are clear and accurate. Make sure that you are given enough medication to last  until your next medication refill appointment. 5. Taking Medication: Take medication as prescribed. Never take more pills than instructed. Never take medication more frequently than prescribed. Taking less pills or less frequently is permitted and encouraged, when it comes to controlled substances (written prescriptions).  6. Inform other Doctors: Always inform, all of your healthcare providers, of all the medications you take. 7. Pain Medication from other Providers: You are not allowed to accept any additional pain medication from any other Doctor or Healthcare provider. There are two exceptions to this rule. (see below) In the event that you require additional pain medication, you are responsible for notifying us, as stated below. 8. Medication Agreement: You are responsible for carefully reading and following our Medication Agreement. This must be signed before receiving any prescriptions from our practice. Safely store a copy of your signed Agreement. Violations to the Agreement will result in no further prescriptions. (Additional copies of our Medication Agreement are available upon request.) 9. Laws, Rules, & Regulations: All patients are expected to follow all Federal and State Laws, Statutes, Rules, & Regulations. Ignorance of the Laws does not constitute a valid excuse. The use of any illegal substances is prohibited. 10. Adopted CDC guidelines & recommendations: Target dosing levels will be at or below 60 MME/day. Use of benzodiazepines** is not recommended.  Exceptions: There are only two exceptions to the rule of not receiving pain medications from other Healthcare Providers. 1. Exception #1 (Emergencies): In the event of an emergency (i.e.: accident requiring emergency care), you are allowed to receive additional pain medication. However, you are responsible for: As soon as you are able, call our office [phone], at any time of the day or night, and leave a message stating your name, the  date and nature of the emergency, and the name and dose of the medication   prescribed. In the event that your call is answered by a member of our staff, make sure to document and save the date, time, and the name of the person that took your information.  2. Exception #2 (Planned Surgery): In the event that you are scheduled by another doctor or dentist to have any type of surgery or procedure, you are allowed (for a period no longer than 30 days), to receive additional pain medication, for the acute post-op pain. However, in this case, you are responsible for picking up a copy of our "Post-op Pain Management for Surgeons" handout, and giving it to your surgeon or dentist. This document is available at our office, and does not require an appointment to obtain it. Simply go to our office during business hours (Monday-Thursday from 8:00 AM to 4:00 PM) (Friday 8:00 AM to 12:00 Noon) or if you have a scheduled appointment with [place], prior to your surgery, and ask for it by name. In addition, you will need to provide [place] with your name, name of your surgeon, type of surgery, and date of procedure or surgery.  *Opioid medications include: morphine, codeine, oxycodone, oxymorphone, hydrocodone, hydromorphone, meperidine, tramadol, tapentadol, buprenorphine, fentanyl, methadone. **Benzodiazepine medications include: diazepam (Valium), alprazolam (Xanax), clonazepam (Klonopine), lorazepam (Ativan), clorazepate (Tranxene), chlordiazepoxide (Librium), estazolam (Prosom), oxazepam (Serax), temazepam (Restoril), triazolam (Halcion) (Last updated: 03/08/2017) ____________________________________________________________________________________________   BMI Assessment: Estimated body mass index is 57.61 kg/m as calculated from the following:   Height as of this encounter: 5\' 2"  (1.575 m).   Weight as of this encounter: 315 lb (142.9 kg).  BMI interpretation table: BMI level Category Range association with higher  incidence of chronic pain  <18 kg/m2 Underweight   18.5-24.9 kg/m2 Ideal body weight   25-29.9 kg/m2 Overweight Increased incidence by 20%  30-34.9 kg/m2 Obese (Class I) Increased incidence by 68%  35-39.9 kg/m2 Severe obesity (Class II) Increased incidence by 136%  >40 kg/m2 Extreme obesity (Class III) Increased incidence by 254%   Patient's current BMI Ideal Body weight  Body mass index is 57.61 kg/m. Ideal body weight: 50.1 kg (110 lb 7.2 oz) Adjusted ideal body weight: 87.2 kg (192 lb 4.3 oz)   BMI Readings from Last 4 Encounters:  07/24/17 57.61 kg/m  04/24/17 57.61 kg/m  02/01/17 57.61 kg/m  11/01/16 55.79 kg/m   Wt Readings from Last 4 Encounters:  07/24/17 (!) 315 lb (142.9 kg)  04/24/17 (!) 315 lb (142.9 kg)  02/01/17 (!) 315 lb (142.9 kg)  11/01/16 (!) 305 lb (138.3 kg)

## 2017-07-24 NOTE — Progress Notes (Signed)
Nursing Pain Medication Assessment:  Safety precautions to be maintained throughout the outpatient stay will include: orient to surroundings, keep bed in low position, maintain call bell within reach at all times, provide assistance with transfer out of bed and ambulation.  Medication Inspection Compliance: Pill count conducted under aseptic conditions, in front of the patient. Neither the pills nor the bottle was removed from the patient's sight at any time. Once count was completed pills were immediately returned to the patient in their original bottle.  Medication #1: Hydrocodone/APAP Pill/Patch Count: 117 of 120 pills remain Pill/Patch Appearance: Markings consistent with prescribed medication Bottle Appearance: Standard pharmacy container. Clearly labeled. Filled Date: 07 / 15 / 2019 Last Medication intake:  Today  Medication #2: Morphine ER (MSContin) Pill/Patch Count: 87 of 90 pills remain Pill/Patch Appearance: Markings consistent with prescribed medication Bottle Appearance: Standard pharmacy container. Clearly labeled. Filled Date: 07 / 15 / 2019 Last Medication intake:  Today

## 2017-11-07 ENCOUNTER — Encounter: Payer: Self-pay | Admitting: Nurse Practitioner

## 2017-11-07 ENCOUNTER — Ambulatory Visit: Payer: BLUE CROSS/BLUE SHIELD | Attending: Nurse Practitioner | Admitting: Nurse Practitioner

## 2017-11-07 VITALS — BP 121/86 | HR 95 | Temp 98.5°F | Resp 16 | Ht 62.0 in | Wt 307.0 lb

## 2017-11-07 DIAGNOSIS — I129 Hypertensive chronic kidney disease with stage 1 through stage 4 chronic kidney disease, or unspecified chronic kidney disease: Secondary | ICD-10-CM | POA: Insufficient documentation

## 2017-11-07 DIAGNOSIS — G894 Chronic pain syndrome: Secondary | ICD-10-CM | POA: Diagnosis not present

## 2017-11-07 DIAGNOSIS — Z823 Family history of stroke: Secondary | ICD-10-CM | POA: Diagnosis not present

## 2017-11-07 DIAGNOSIS — M47816 Spondylosis without myelopathy or radiculopathy, lumbar region: Secondary | ICD-10-CM | POA: Diagnosis not present

## 2017-11-07 DIAGNOSIS — Z818 Family history of other mental and behavioral disorders: Secondary | ICD-10-CM | POA: Insufficient documentation

## 2017-11-07 DIAGNOSIS — Z5181 Encounter for therapeutic drug level monitoring: Secondary | ICD-10-CM | POA: Diagnosis not present

## 2017-11-07 DIAGNOSIS — M797 Fibromyalgia: Secondary | ICD-10-CM | POA: Insufficient documentation

## 2017-11-07 DIAGNOSIS — F329 Major depressive disorder, single episode, unspecified: Secondary | ICD-10-CM | POA: Diagnosis not present

## 2017-11-07 DIAGNOSIS — Z87891 Personal history of nicotine dependence: Secondary | ICD-10-CM | POA: Insufficient documentation

## 2017-11-07 DIAGNOSIS — K219 Gastro-esophageal reflux disease without esophagitis: Secondary | ICD-10-CM | POA: Insufficient documentation

## 2017-11-07 DIAGNOSIS — M4726 Other spondylosis with radiculopathy, lumbar region: Secondary | ICD-10-CM | POA: Insufficient documentation

## 2017-11-07 DIAGNOSIS — Z6841 Body Mass Index (BMI) 40.0 and over, adult: Secondary | ICD-10-CM | POA: Insufficient documentation

## 2017-11-07 DIAGNOSIS — Z79899 Other long term (current) drug therapy: Secondary | ICD-10-CM | POA: Insufficient documentation

## 2017-11-07 DIAGNOSIS — J45909 Unspecified asthma, uncomplicated: Secondary | ICD-10-CM | POA: Diagnosis not present

## 2017-11-07 DIAGNOSIS — E1122 Type 2 diabetes mellitus with diabetic chronic kidney disease: Secondary | ICD-10-CM | POA: Diagnosis not present

## 2017-11-07 DIAGNOSIS — Z833 Family history of diabetes mellitus: Secondary | ICD-10-CM | POA: Insufficient documentation

## 2017-11-07 DIAGNOSIS — Z794 Long term (current) use of insulin: Secondary | ICD-10-CM | POA: Diagnosis not present

## 2017-11-07 DIAGNOSIS — Z886 Allergy status to analgesic agent status: Secondary | ICD-10-CM | POA: Insufficient documentation

## 2017-11-07 DIAGNOSIS — N183 Chronic kidney disease, stage 3 (moderate): Secondary | ICD-10-CM | POA: Diagnosis not present

## 2017-11-07 DIAGNOSIS — Z7982 Long term (current) use of aspirin: Secondary | ICD-10-CM | POA: Insufficient documentation

## 2017-11-07 DIAGNOSIS — Z79891 Long term (current) use of opiate analgesic: Secondary | ICD-10-CM | POA: Insufficient documentation

## 2017-11-07 DIAGNOSIS — E559 Vitamin D deficiency, unspecified: Secondary | ICD-10-CM | POA: Insufficient documentation

## 2017-11-07 DIAGNOSIS — M2550 Pain in unspecified joint: Secondary | ICD-10-CM | POA: Diagnosis present

## 2017-11-07 DIAGNOSIS — R161 Splenomegaly, not elsewhere classified: Secondary | ICD-10-CM | POA: Insufficient documentation

## 2017-11-07 DIAGNOSIS — E538 Deficiency of other specified B group vitamins: Secondary | ICD-10-CM | POA: Insufficient documentation

## 2017-11-07 DIAGNOSIS — M4316 Spondylolisthesis, lumbar region: Secondary | ICD-10-CM | POA: Insufficient documentation

## 2017-11-07 DIAGNOSIS — M1612 Unilateral primary osteoarthritis, left hip: Secondary | ICD-10-CM | POA: Insufficient documentation

## 2017-11-07 DIAGNOSIS — E7800 Pure hypercholesterolemia, unspecified: Secondary | ICD-10-CM | POA: Insufficient documentation

## 2017-11-07 DIAGNOSIS — G4733 Obstructive sleep apnea (adult) (pediatric): Secondary | ICD-10-CM | POA: Insufficient documentation

## 2017-11-07 DIAGNOSIS — G35 Multiple sclerosis: Secondary | ICD-10-CM | POA: Diagnosis not present

## 2017-11-07 DIAGNOSIS — M533 Sacrococcygeal disorders, not elsewhere classified: Secondary | ICD-10-CM | POA: Insufficient documentation

## 2017-11-07 DIAGNOSIS — Z882 Allergy status to sulfonamides status: Secondary | ICD-10-CM | POA: Diagnosis not present

## 2017-11-07 DIAGNOSIS — Z86010 Personal history of colonic polyps: Secondary | ICD-10-CM | POA: Insufficient documentation

## 2017-11-07 DIAGNOSIS — Z880 Allergy status to penicillin: Secondary | ICD-10-CM | POA: Insufficient documentation

## 2017-11-07 MED ORDER — MORPHINE SULFATE ER 30 MG PO TBCR: 30.0000 mg | EXTENDED_RELEASE_TABLET | Freq: Three times a day (TID) | ORAL | 0 refills | Status: DC | PRN

## 2017-11-07 MED ORDER — HYDROCODONE-ACETAMINOPHEN 5-325 MG PO TABS: 1.0000 | ORAL_TABLET | Freq: Four times a day (QID) | ORAL | 0 refills | Status: DC | PRN

## 2017-11-07 NOTE — Patient Instructions (Addendum)
____________________________________________________________________________________________  Medication Rules  Applies to: All patients receiving prescriptions (written or electronic).  Pharmacy of record: Pharmacy where electronic prescriptions will be sent. If written prescriptions are taken to a different pharmacy, please inform the nursing staff. The pharmacy listed in the electronic medical record should be the one where you would like electronic prescriptions to be sent.  Prescription refills: Only during scheduled appointments. Applies to both, written and electronic prescriptions.  NOTE: The following applies primarily to controlled substances (Opioid* Pain Medications).   Patient's responsibilities: 1. Pain Pills: Bring all pain pills to every appointment (except for procedure appointments). 2. Pill Bottles: Bring pills in original pharmacy bottle. Always bring newest bottle. Bring bottle, even if empty. 3. Medication refills: You are responsible for knowing and keeping track of what medications you need refilled. The day before your appointment, write a list of all prescriptions that need to be refilled. Bring that list to your appointment and give it to the admitting nurse. Prescriptions will be written only during appointments. If you forget a medication, it will not be "Called in", "Faxed", or "electronically sent". You will need to get another appointment to get these prescribed. 4. Prescription Accuracy: You are responsible for carefully inspecting your prescriptions before leaving our office. Have the discharge nurse carefully go over each prescription with you, before taking them home. Make sure that your name is accurately spelled, that your address is correct. Check the name and dose of your medication to make sure it is accurate. Check the number of pills, and the written instructions to make sure they are clear and accurate. Make sure that you are given enough medication to last  until your next medication refill appointment. 5. Taking Medication: Take medication as prescribed. Never take more pills than instructed. Never take medication more frequently than prescribed. Taking less pills or less frequently is permitted and encouraged, when it comes to controlled substances (written prescriptions).  6. Inform other Doctors: Always inform, all of your healthcare providers, of all the medications you take. 7. Pain Medication from other Providers: You are not allowed to accept any additional pain medication from any other Doctor or Healthcare provider. There are two exceptions to this rule. (see below) In the event that you require additional pain medication, you are responsible for notifying us, as stated below. 8. Medication Agreement: You are responsible for carefully reading and following our Medication Agreement. This must be signed before receiving any prescriptions from our practice. Safely store a copy of your signed Agreement. Violations to the Agreement will result in no further prescriptions. (Additional copies of our Medication Agreement are available upon request.) 9. Laws, Rules, & Regulations: All patients are expected to follow all Federal and State Laws, Statutes, Rules, & Regulations. Ignorance of the Laws does not constitute a valid excuse. The use of any illegal substances is prohibited. 10. Adopted CDC guidelines & recommendations: Target dosing levels will be at or below 60 MME/day. Use of benzodiazepines** is not recommended.  Exceptions: There are only two exceptions to the rule of not receiving pain medications from other Healthcare Providers. 1. Exception #1 (Emergencies): In the event of an emergency (i.e.: accident requiring emergency care), you are allowed to receive additional pain medication. However, you are responsible for: As soon as you are able, call our office [phone], at any time of the day or night, and leave a message stating your name, the  date and nature of the emergency, and the name and dose of the medication   prescribed. In the event that your call is answered by a member of our staff, make sure to document and save the date, time, and the name of the person that took your information.  2. Exception #2 (Planned Surgery): In the event that you are scheduled by another doctor or dentist to have any type of surgery or procedure, you are allowed (for a period no longer than 30 days), to receive additional pain medication, for the acute post-op pain. However, in this case, you are responsible for picking up a copy of our "Post-op Pain Management for Surgeons" handout, and giving it to your surgeon or dentist. This document is available at our office, and does not require an appointment to obtain it. Simply go to our office during business hours (Monday-Thursday from 8:00 AM to 4:00 PM) (Friday 8:00 AM to 12:00 Noon) or if you have a scheduled appointment with [place], prior to your surgery, and ask for it by name. In addition, you will need to provide [place] with your name, name of your surgeon, type of surgery, and date of procedure or surgery.  *Opioid medications include: morphine, codeine, oxycodone, oxymorphone, hydrocodone, hydromorphone, meperidine, tramadol, tapentadol, buprenorphine, fentanyl, methadone. **Benzodiazepine medications include: diazepam (Valium), alprazolam (Xanax), clonazepam (Klonopine), lorazepam (Ativan), clorazepate (Tranxene), chlordiazepoxide (Librium), estazolam (Prosom), oxazepam (Serax), temazepam (Restoril), triazolam (Halcion) (Last updated: 03/08/2017) ____________________________________________________________________________________________   BMI Assessment: Estimated body mass index is 56.15 kg/m as calculated from the following:   Height as of this encounter: 5\' 2"  (1.575 m).   Weight as of this encounter: 307 lb (139.3 kg).  BMI interpretation table: BMI level Category Range association with higher  incidence of chronic pain  <18 kg/m2 Underweight   18.5-24.9 kg/m2 Ideal body weight   25-29.9 kg/m2 Overweight Increased incidence by 20%  30-34.9 kg/m2 Obese (Class I) Increased incidence by 68%  35-39.9 kg/m2 Severe obesity (Class II) Increased incidence by 136%  >40 kg/m2 Extreme obesity (Class III) Increased incidence by 254%   Patient's current BMI Ideal Body weight  Body mass index is 56.15 kg/m. Ideal body weight: 50.1 kg (110 lb 7.2 oz) Adjusted ideal body weight: 85.8 kg (189 lb 1.1 oz)   BMI Readings from Last 4 Encounters:  11/07/17 56.15 kg/m  07/24/17 57.61 kg/m  04/24/17 57.61 kg/m  02/01/17 57.61 kg/m   Wt Readings from Last 4 Encounters:  11/07/17 (!) 307 lb (139.3 kg)  07/24/17 (!) 315 lb (142.9 kg)  04/24/17 (!) 315 lb (142.9 kg)  02/01/17 (!) 315 lb (142.9 kg)   Hydrocodone - apap 5-325 mg x 3 months to begin filling on 11/20/17 escribed  MS Contin 30mg  x 3 months to begin filling on 11/20/17 escribed.

## 2017-11-07 NOTE — Progress Notes (Signed)
Patient's Name: Tiffany Herring  MRN: [id number]  Referring Provider: Hortencia Pilar, MD  DOB: 15-Apr-1960  PCP: Hortencia Pilar, MD  DOS: 11/07/2017  Note by: Vevelyn Francois NP  Service setting: Ambulatory outpatient  Specialty: Interventional Pain Management  Location: [facility] (AMB) Pain Management Facility    Patient type: Established    Primary Reason(s) for Visit: Encounter for prescription drug management. (Level of risk: moderate)  CC: Joint Pain (osteoarthritis) and Generalized Body Aches (MS pain )  HPI  [name] is a 57 y.o. year old, female patient, who comes today for a medication management evaluation. She has Failed back surgical syndrome; Epidural fibrosis; Chronic bilateral low back pain with right-sided sciatica; Chronic lumbar radicular pain (Right); Long term current use of opiate analgesic; Long term prescription opiate use; Opiate use (80 MME/Day); Opiate dependence ([place]); Encounter for therapeutic drug level monitoring; Allergic rhinitis; Carotid artery bruit; Chronic kidney disease (CKD), stage III (moderate) ([place]); Colon polyp; Diabetes mellitus ([place]); Dysthymia; Essential (primary) hypertension; Generalized OA; Acid reflux; History of intermenstrual bleeding; History of migraine; H/O nutritional disorder; Hypercholesterolemia; Type 2 diabetes mellitus treated with insulin ([place]); Other long term (current) drug therapy; Menopausal and perimenopausal disorder; Class III Morbid obesity ([place]) (254% higher incidence of chronic low back pain); Multiple sclerosis ([place]); Obstructive apnea; Degenerative arthritis of hip; Awareness of heartbeats; History of colon polyps; Post menopausal syndrome; Pure hypercholesterolemia; B12 deficiency; Fibromyalgia; Lumbar spondylosis; Grade 1 Anterolisthesis of L4 over L5; Chronic sacroiliac joint pain (bilateral); Chronic hip pain (Left); Arthropathy of left hip (Severe chronic left hip DJD); Osteoarthritis of hip (Left); Lumbar facet arthropathy ([place]);  Lumbar facet hypertrophy; Lumbar facet syndrome; Chronic flank pain (Right); Chronic lower extremity pain (Right); Breast cancer screening; Chronic pain syndrome; Morbid obesity with BMI of 50.0-59.9, adult ([place]); Post-menopausal; Venous stasis ulcer ([place]); Vitamin D deficiency; GERD (gastroesophageal reflux disease); OA (osteoarthritis); Palpitations; Vitamin B12 deficiency; Carotid bruit; CKD (chronic kidney disease); Osteoarthrosis, hip; Type 2 diabetes mellitus with stage 3 chronic kidney disease, with long-term current use of insulin ([place]); Long term use of drug; Splenomegaly; Essential hypertension with goal blood pressure less than 140/90; Generalized osteoarthritis; History of colonic polyps; History of irregular menstrual bleeding; Menopausal and postmenopausal disorder; and Obstructive sleep apnea on their problem list. Her primarily concern today is the Joint Pain (osteoarthritis) and Generalized Body Aches (MS pain )  Pain Assessment: Location: Other (Comment)(all over ) Other (Comment)(joint and generalized pain r/t osteoarthritis and MS) Radiating: left hip to the knee  Onset: More than a month ago Duration: Chronic pain Quality: Discomfort, Sharp, Constant Severity: 4 /10 (subjective, self-reported pain score)  Note: Reported level is compatible with observation.                          Effect on ADL: patient uses wheelchair at all times, has motorized wheelchair at home.  Timing: Constant Modifying factors: medications BP: 121/86  HR: 95  [name] was last scheduled for an appointment on 07/24/2017 for medication management. During today's appointment we reviewed Ms. Macgregor's chronic pain status, as well as her outpatient medication regimen. She admits hat her ain is stabel.She does have constipation but uses a stool softner which is effective.   The patient  reports that she does not use drugs. Her body mass index is 56.15 kg/m.  Further details on both, my assessment(s),  as well as the proposed treatment plan, please see below.  Controlled Substance Pharmacotherapy Assessment REMS (Risk Evaluation  and Mitigation Strategy)  Analgesic:Morphine ER 30 mg every 8 hours (90 mg/day) + hydrocodone/APAP 5/325 one every 6 hours (20 mg/dayof hydrocodone) MME/day:165m/day. PJanett Billow RN  11/07/2017  1:59 PM  Sign at close encounter Nursing Pain Medication Assessment:  Safety precautions to be maintained throughout the outpatient stay will include: orient to surroundings, keep bed in low position, maintain call bell within reach at all times, provide assistance with transfer out of bed and ambulation.  Medication Inspection Compliance: Pill count conducted under aseptic conditions, in front of the patient. Neither the pills nor the bottle was removed from the patient's sight at any time. Once count was completed pills were immediately returned to the patient in their original bottle.  Medication #1: Morphine ER (MSContin) Pill/Patch Count: 42 of 90 pills remain Pill/Patch Appearance: Markings consistent with prescribed medication Bottle Appearance: Standard pharmacy container. Clearly labeled. Filled Date: 10 / 12 / 2019 Last Medication intake:  Today  Medication #2: Hydrocodone/APAP Pill/Patch Count: 59 of 120 pills remain Pill/Patch Appearance: Markings consistent with prescribed medication Bottle Appearance: Standard pharmacy container. Clearly labeled. Filled Date: 10 / 12 / 2019 Last Medication intake:  Today   Pharmacokinetics: Liberation and absorption (onset of action): WNL Distribution (time to peak effect): WNL Metabolism and excretion (duration of action): WNL         Pharmacodynamics: Desired effects: Analgesia: Ms. SGorenreports >50% benefit. Functional ability: Patient reports that medication allows her to accomplish basic ADLs Clinically meaningful improvement in function (CMIF): Sustained CMIF goals met Perceived effectiveness:  Described as relatively effective, allowing for increase in activities of daily living (ADL) Undesirable effects: Side-effects or Adverse reactions: None reported Monitoring: [place] PMP: Online review of the past 178-montheriod conducted. Compliant with practice rules and regulations Last UDS on record: Summary  Date Value Ref Range Status  04/24/2017 FINAL  Final    Comment:    ==================================================================== TOXASSURE SELECT 13 (MW) ==================================================================== Test                             Result       Flag       Units Drug Present and Declared for Prescription Verification   Morphine                       >1>23536     EXPECTED   ng/mg creat   Normorphine                    264          EXPECTED   ng/mg creat    Potential sources of large amounts of morphine in the absence of    codeine include administration of morphine or use of heroin.    Normorphine is an expected metabolite of morphine.   Hydrocodone                    1079         EXPECTED   ng/mg creat   Hydromorphone                  99           EXPECTED   ng/mg creat   Norhydrocodone                 1047         EXPECTED   ng/mg creat  Sources of hydrocodone include scheduled prescription    medications. Hydromorphone and norhydrocodone are expected    metabolites of hydrocodone. Hydromorphone is also available as a    scheduled prescription medication. ==================================================================== Test                      Result    Flag   Units      Ref Range   Creatinine              81               mg/dL      >=20 ==================================================================== Declared Medications:  The flagging and interpretation on this report are based on the  following declared medications.  Unexpected results may arise from  inaccuracies in the declared medications.  **Note: The testing scope of this  panel includes these medications:  Hydrocodone (Hydrocodone-Acetaminophen)  Morphine  **Note: The testing scope of this panel does not include following  reported medications:  Acetaminophen (Hydrocodone-Acetaminophen)  Albuterol  Aspirin (Aspirin 81)  Bupropion  Cholecalciferol  Clotrimazole  Cyanocobalamin  Furosemide  Gabapentin  Insulin  Mirtazapine  Montelukast  Oxybutynin  Tizanidine  Triamcinolone ==================================================================== For clinical consultation, please call [phone]. ====================================================================    UDS interpretation: Compliant          Medication Assessment Form: Reviewed. Patient indicates being compliant with therapy Treatment compliance: Compliant Risk Assessment Profile: Aberrant behavior: See prior evaluations. None observed or detected today Comorbid factors increasing risk of overdose: See prior notes. No additional risks detected today Opioid risk tool (ORT) (Total Score):   Personal History of Substance Abuse (SUD-Substance use disorder):  Alcohol:    Illegal Drugs:    Rx Drugs:    ORT Risk Level calculation:   Risk of substance use disorder (SUD): Low  ORT Scoring interpretation table:  Score <3 = Low Risk for SUD  Score between 4-7 = Moderate Risk for SUD  Score >8 = High Risk for Opioid Abuse   Risk Mitigation Strategies:  Patient Counseling: Covered Patient-Prescriber Agreement (PPA): Present and active  Notification to other healthcare providers: Done  Pharmacologic Plan: No change in therapy, at this time.             Laboratory Chemistry  Inflammation Markers (CRP: Acute Phase) (ESR: Chronic Phase) Lab Results  Component Value Date   CRP 2.0 (H) 02/15/2015   ESRSEDRATE 28 02/15/2015                         Rheumatology Markers No results found for: RF, ANA, LABURIC, URICUR, LYMEIGGIGMAB, LYMEABIGMQN, HLAB27                      Renal  Function Markers Lab Results  Component Value Date   BUN 27 (H) 02/15/2015   CREATININE 1.42 (H) 02/15/2015   GFRAA 47 (L) 02/15/2015   GFRNONAA 41 (L) 02/15/2015                             Hepatic Function Markers Lab Results  Component Value Date   AST 16 02/15/2015   ALT 19 02/15/2015   ALBUMIN 4.3 02/15/2015   ALKPHOS 77 02/15/2015                        Electrolytes Lab Results  Component Value Date   NA 138 02/15/2015  K 4.8 02/15/2015   CL 102 02/15/2015   CALCIUM 9.4 02/15/2015   MG 2.0 02/15/2015                        Neuropathy Markers No results found for: VITAMINB12, FOLATE, HGBA1C, HIV                      CNS Tests No results found for: COLORCSF, APPEARCSF, RBCCOUNTCSF, WBCCSF, POLYSCSF, LYMPHSCSF, EOSCSF, PROTEINCSF, GLUCCSF, JCVIRUS, CSFOLI, IGGCSF                      Bone Pathology Markers No results found for: VD25OH, [id number], [id number], [id number], 25OHVITD1, 25OHVITD2, 25OHVITD3, TESTOFREE, TESTOSTERONE                       Coagulation Parameters No results found for: INR, LABPROT, APTT, PLT, DDIMER                      Cardiovascular Markers No results found for: BNP, CKTOTAL, CKMB, TROPONINI, HGB, HCT                       CA Markers No results found for: CEA, CA125, LABCA2                      Note: Lab results reviewed.  Recent Diagnostic Imaging Results  US Abdomen Complete PRIOR REPORT IMPORTED FROM AN EXTERNAL SYSTEM  PRIOR REPORT IMPORTED FROM THE [employer] WORKFLOW SYSTEM   REASON FOR EXAM:    Enlarged Liver Spleen  COMMENTS:   PROCEDURE:     [place]  - US ABDOMEN GENERAL SURVEY  - Jul 29 2012 10:20AM   RESULT:     The liver is enlarged. It demonstrates increased echo texture  consistent with fatty infiltration. Portal venous flow is normal in  direction toward the liver. There is splenomegaly with maximal dimension  of  the spleen of 16.2 cm.   The gallbladder is adequately distended with no evidence of stones, wall   thickening, or pericholecystic fluid. There is no positive sonographic  Murphy's sign. The common bile duct measures 5.5 mm in diameter.   Evaluation the pancreas is limited by bowel gas. Similarly the abdominal  aorta is only partially visualized. The inferior vena cava and kidneys are  normal in appearance.   IMPRESSION:  1. There is hepatosplenomegaly.  2. The gallbladder and common bile duct and observed portions of the  pancreas appear normal.  3. The kidneys exhibit no evidence of obstruction nor other acute  abnormality.   Dictation Site: 2     Complexity Note: Imaging results reviewed. Results shared with Ms. Minette Brine, using [employer].                         Meds   Current Outpatient Medications:  .  albuterol (PROAIR HFA) 108 (90 Base) MCG/ACT inhaler, Inhale 1 puff into the lungs as needed. , Disp: , Rfl:  .  aspirin 81 MG chewable tablet, Chew 81 mg by mouth daily., Disp: , Rfl:  .  B-D INS SYR ULTRAFINE 1CC/30G 30G X 1/2" 1 ML MISC, , Disp: , Rfl:  .  Blood Glucose Monitoring Suppl (FIFTY50 GLUCOSE METER 2.0) w/Device KIT, Check sugars three times daily.  On insulin.  E11.9, Disp: , Rfl:  .  buPROPion [facility] SR)  150 MG 12 hr tablet, Take 150 mg by mouth 2 (two) times daily. , Disp: , Rfl:  .  Cholecalciferol (VITAMIN D3) 5000 units CAPS, Take 1 capsule by mouth daily. , Disp: , Rfl:  .  clotrimazole (MYCELEX) 10 MG troche, Take 1 tablet by mouth 5 (five) times daily as needed., Disp: , Rfl:  .  enalapril (VASOTEC) 20 MG tablet, Take 20 mg by mouth daily., Disp: , Rfl:  .  furosemide (LASIX) 20 MG tablet, Take 20 mg by mouth daily. , Disp: , Rfl:  .  gabapentin (NEURONTIN) 600 MG tablet, 600 mg 3 (three) times daily. , Disp: , Rfl:  .  Glucose Blood (BLOOD GLUCOSE TEST STRIPS) STRP, 1 each by Other route as needed. Use as instructed, Disp: , Rfl:  .  glucose blood ([employer] TEST STRIPS) test strip, Check sugars four times daily.  On insulin.  E11.65, Disp: ,  Rfl:  .  [START ON 01/19/2018] HYDROcodone-acetaminophen (NORCO/VICODIN) 5-325 MG tablet, Take 1 tablet by mouth every 6 (six) hours as needed for severe pain., Disp: 120 tablet, Rfl: 0 .  insulin NPH-regular Human (NOVOLIN 70/30) (70-30) 100 UNIT/ML injection, Inject 88 Units into the skin daily with supper. , Disp: , Rfl:  .  insulin regular (NOVOLIN R,HUMULIN R) 100 units/mL injection, SSI: 151-200: 2 units, 201-250: 4 units, 251-300:6 units, 301-350:8 units, 351-400: 10 units > 400 call MD, Disp: , Rfl:  .  Lancets (ACCU-CHEK MULTICLIX) lancets, 1 each., Disp: , Rfl:  .  mirtazapine (REMERON) 15 MG tablet, Take 15 mg by mouth at bedtime. , Disp: , Rfl:  .  montelukast (SINGULAIR) 10 MG tablet, Take 10 mg by mouth daily. , Disp: , Rfl:  .  [START ON 12/20/2017] morphine (MS CONTIN) 30 MG 12 hr tablet, Take 1 tablet (30 mg total) by mouth every 8 (eight) hours as needed for pain., Disp: 90 tablet, Rfl: 0 .  oxybutynin (DITROPAN XL) 10 MG 24 hr tablet, Take 10 mg by mouth daily. , Disp: , Rfl:  .  tiZANidine (ZANAFLEX) 4 MG tablet, Take 4 mg by mouth every 6 (six) hours. , Disp: , Rfl:  .  triamcinolone cream (KENALOG) 0.5 %, Apply 1 application topically 2 (two) times daily. , Disp: , Rfl:  .  vitamin B-12 (CYANOCOBALAMIN) 1000 MCG tablet, 1,000 mcg. Take 1,000 mcg by mouth daily., Disp: , Rfl:  .  [START ON 12/20/2017] HYDROcodone-acetaminophen (NORCO/VICODIN) 5-325 MG tablet, Take 1 tablet by mouth every 6 (six) hours as needed for severe pain., Disp: 120 tablet, Rfl: 0 .  [START ON 11/20/2017] HYDROcodone-acetaminophen (NORCO/VICODIN) 5-325 MG tablet, Take 1 tablet by mouth every 6 (six) hours as needed for severe pain., Disp: 120 tablet, Rfl: 0 .  [START ON 01/19/2018] morphine (MS CONTIN) 30 MG 12 hr tablet, Take 1 tablet (30 mg total) by mouth every 8 (eight) hours as needed for pain., Disp: 90 tablet, Rfl: 0 .  [START ON 11/20/2017] morphine (MS CONTIN) 30 MG 12 hr tablet, Take 1 tablet (30 mg  total) by mouth every 8 (eight) hours as needed for pain., Disp: 90 tablet, Rfl: 0  ROS  Constitutional: Denies any fever or chills Gastrointestinal: No reported hemesis, hematochezia, vomiting, or acute GI distress Musculoskeletal: Denies any acute onset joint swelling, redness, loss of ROM, or weakness Neurological: No reported episodes of acute onset apraxia, aphasia, dysarthria, agnosia, amnesia, paralysis, loss of coordination, or loss of consciousness  Allergies  Ms. Messler is allergic to augmentin [amoxicillin-pot  clavulanate]; sulfa antibiotics; and mobic [meloxicam].  [place]  Drug: Ms. Hirt  reports that she does not use drugs. Alcohol:  reports that she does not drink alcohol. Tobacco:  reports that she has quit smoking. She has never used smokeless tobacco. Medical:  has a past medical history of Allergy, Arthritis, Asthma, Chronic kidney disease, Depression, Diabetes mellitus without complication ([place]), Hypertension, Multiple sclerosis ([place]), Neuromuscular disorder ([place]), and Sleep apnea. Surgical: Ms. Fowers  has a past surgical history that includes Cesarean section (1991 and 1996). Family: family history includes Dementia in her mother; Depression in her mother; Diabetes in her mother; Hyperlipidemia in her mother; Stroke in her father.  Constitutional Exam  General appearance: Well nourished, well developed, and well hydrated. In no apparent acute distress Vitals:   11/07/17 1341  BP: 121/86  Pulse: 95  Resp: 16  Temp: 98.5 F (36.9 C)  TempSrc: Oral  SpO2: 99%  Weight: (!) 307 lb (139.3 kg)  Height: 5' 2"  (1.575 m)  Psych/Mental status: Alert, oriented x 3 (person, place, & time)       Eyes: PERLA Respiratory: No evidence of acute respiratory distress  Cervical Spine Area Exam  Skin & Axial Inspection: No masses, redness, edema, swelling, or associated skin lesions Alignment: Symmetrical Functional ROM: Unrestricted ROM      Stability: No instability  detected Muscle Tone/Strength: Functionally intact. No obvious neuro-muscular anomalies detected. Sensory (Neurological): Unimpaired Palpation: No palpable anomalies              Upper Extremity (UE) Exam    Side: Right upper extremity  Side: Left upper extremity  Skin & Extremity Inspection: Skin color, temperature, and hair growth are WNL. No peripheral edema or cyanosis. No masses, redness, swelling, asymmetry, or associated skin lesions. No contractures.  Skin & Extremity Inspection: Skin color, temperature, and hair growth are WNL. No peripheral edema or cyanosis. No masses, redness, swelling, asymmetry, or associated skin lesions. No contractures.  Functional ROM: Unrestricted ROM          Functional ROM: Unrestricted ROM          Muscle Tone/Strength: Functionally intact. No obvious neuro-muscular anomalies detected.  Muscle Tone/Strength: Functionally intact. No obvious neuro-muscular anomalies detected.  Sensory (Neurological): Unimpaired          Sensory (Neurological): Unimpaired          Palpation: No palpable anomalies              Palpation: No palpable anomalies              Provocative Test(s):  Phalen's test: deferred Tinel's test: deferred Apley's scratch test (touch opposite shoulder):  Action 1 (Across chest): deferred Action 2 (Overhead): deferred Action 3 (LB reach): deferred   Provocative Test(s):  Phalen's test: deferred Tinel's test: deferred Apley's scratch test (touch opposite shoulder):  Action 1 (Across chest): deferred Action 2 (Overhead): deferred Action 3 (LB reach): deferred    Thoracic Spine Area Exam  Skin & Axial Inspection: No masses, redness, or swelling Alignment: Symmetrical Functional ROM: Unrestricted ROM Stability: No instability detected Muscle Tone/Strength: Functionally intact. No obvious neuro-muscular anomalies detected. Sensory (Neurological): Unimpaired Muscle strength & Tone: No palpable anomalies  Lumbar Spine Area Exam  Skin &  Axial Inspection: No masses, redness, or swelling Alignment: Symmetrical Functional ROM: Unrestricted ROM       Stability: No instability detected Muscle Tone/Strength: Functionally intact. No obvious neuro-muscular anomalies detected. Sensory (Neurological): Unimpaired Palpation: No palpable anomalies  Provocative Tests: Hyperextension/rotation test: deferred today       Lumbar quadrant test (Kemp's test): deferred today       Lateral bending test: deferred today       Patrick's Maneuver: deferred today                   FABER test: deferred today                   S-I anterior distraction/compression test: deferred today         S-I lateral compression test: deferred today         S-I Thigh-thrust test: deferred today         S-I Gaenslen's test: deferred today          Gait & Posture Assessment  Ambulation: Unassisted Gait: Relatively normal for age and body habitus Posture: WNL   Lower Extremity Exam    Side: Right lower extremity  Side: Left lower extremity  Stability: No instability observed          Stability: No instability observed          Skin & Extremity Inspection: Skin color, temperature, and hair growth are WNL. No peripheral edema or cyanosis. No masses, redness, swelling, asymmetry, or associated skin lesions. No contractures.  Skin & Extremity Inspection: Skin color, temperature, and hair growth are WNL. No peripheral edema or cyanosis. No masses, redness, swelling, asymmetry, or associated skin lesions. No contractures.  Functional ROM: Unrestricted ROM                  Functional ROM: Unrestricted ROM                  Muscle Tone/Strength: Functionally intact. No obvious neuro-muscular anomalies detected.  Muscle Tone/Strength: Functionally intact. No obvious neuro-muscular anomalies detected.  Sensory (Neurological): Unimpaired  Sensory (Neurological): Unimpaired  Palpation: No palpable anomalies  Palpation: No palpable anomalies   Assessment  Primary  Diagnosis & Pertinent Problem List: The primary encounter diagnosis was Lumbar spondylosis. 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  Status Diagnosis  Controlled Controlled Controlled 1. Lumbar spondylosis   2. Fibromyalgia   3. Arthropathy of left hip (Severe chronic left hip DJD)   4. Chronic pain syndrome     Problems updated and reviewed during this visit: No problems updated. Plan of Care  Pharmacotherapy (Medications Ordered): Meds ordered this encounter  Medications  . HYDROcodone-acetaminophen (NORCO/VICODIN) 5-325 MG tablet    Sig: Take 1 tablet by mouth every 6 (six) hours as needed for severe pain.    Dispense:  120 tablet    Refill:  0    Do not place this medication, or any other prescription from our practice, on "Automatic Refill". Patient may have prescription filled one day early if pharmacy is closed on scheduled refill date.    Order Specific Question:   Supervising Provider    Answer:   Milinda Pointer [phone]  . HYDROcodone-acetaminophen (NORCO/VICODIN) 5-325 MG tablet    Sig: Take 1 tablet by mouth every 6 (six) hours as needed for severe pain.    Dispense:  120 tablet    Refill:  0    Do not place this medication, or any other prescription from our practice, on "Automatic Refill". Patient may have prescription filled one day early if pharmacy is closed on scheduled refill date.    Order Specific  Question:   Supervising Provider    Answer:   Milinda Pointer [phone]  . HYDROcodone-acetaminophen (NORCO/VICODIN) 5-325 MG tablet    Sig: Take 1 tablet by mouth every 6 (six) hours as needed for severe pain.    Dispense:  120 tablet    Refill:  0    Do not place this medication, or any other prescription from our practice, on "Automatic Refill". Patient may have prescription filled one day early if pharmacy is closed on scheduled refill date.    Order  Specific Question:   Supervising Provider    Answer:   Milinda Pointer [phone]  . morphine (MS CONTIN) 30 MG 12 hr tablet    Sig: Take 1 tablet (30 mg total) by mouth every 8 (eight) hours as needed for pain.    Dispense:  90 tablet    Refill:  0    Do not place this medication, or any other prescription from our practice, on "Automatic Refill". Patient may have prescription filled one day early if pharmacy is closed on scheduled refill date.    Order Specific Question:   Supervising Provider    Answer:   Milinda Pointer [phone]  . morphine (MS CONTIN) 30 MG 12 hr tablet    Sig: Take 1 tablet (30 mg total) by mouth every 8 (eight) hours as needed for pain.    Dispense:  90 tablet    Refill:  0    Do not place this medication, or any other prescription from our practice, on "Automatic Refill". Patient may have prescription filled one day early if pharmacy is closed on scheduled refill date.    Order Specific Question:   Supervising Provider    Answer:   Milinda Pointer [phone]  . morphine (MS CONTIN) 30 MG 12 hr tablet    Sig: Take 1 tablet (30 mg total) by mouth every 8 (eight) hours as needed for pain.    Dispense:  90 tablet    Refill:  0    Do not place this medication, or any other prescription from our practice, on "Automatic Refill". Patient may have prescription filled one day early if pharmacy is closed on scheduled refill date.    Order Specific Question:   Supervising Provider    Answer:   Milinda Pointer [[id number]]   New Prescriptions   No medications on file   Medications administered today: Jaycelynn A. Fitch had no medications administered during this visit. Lab-work, procedure(s), and/or referral(s): No orders of the defined types were placed in this encounter.  Imaging and/or referral(s): None  Interventional therapies: Planned, scheduled, and/or pending: Not at this time.   Considering: Not at this time.   Palliative PRN treatment(s): Not  at this time.    Provider-requested follow-up: Return in about 3 months (around 02/07/2018) for MedMgmt.  Future Appointments  Date Time Provider [place]  02/06/2018  1:30 PM Vevelyn Francois, NP [facility] None   Primary Care Physician: Hortencia Pilar, MD Location: [facility] Note by: Vevelyn Francois NP Date: 11/07/2017; Time: 4:28 PM  Pain Score Disclaimer: We use the NRS-11 scale. This is a self-reported, subjective measurement of pain severity with only modest accuracy. It is used primarily to identify changes within a particular patient. It must be understood that outpatient pain scales are significantly less accurate that those used for research, where they can be applied under ideal controlled circumstances with minimal exposure to variables. In reality, the score is likely to be a combination  of pain intensity and pain affect, where pain affect describes the degree of emotional arousal or changes in action readiness caused by the sensory experience of pain. Factors such as social and work situation, setting, emotional state, anxiety levels, expectation, and prior pain experience may influence pain perception and show large inter-individual differences that may also be affected by time variables.  Patient instructions provided during this appointment: Patient Instructions   ____________________________________________________________________________________________  Medication Rules  Applies to: All patients receiving prescriptions (written or electronic).  Pharmacy of record: Pharmacy where electronic prescriptions will be sent. If written prescriptions are taken to a different pharmacy, please inform the nursing staff. The pharmacy listed in the electronic medical record should be the one where you would like electronic prescriptions to be sent.  Prescription refills: Only during scheduled appointments. Applies to both, written and electronic  prescriptions.  NOTE: The following applies primarily to controlled substances (Opioid* Pain Medications).   Patient's responsibilities: 1. Pain Pills: Bring all pain pills to every appointment (except for procedure appointments). 2. Pill Bottles: Bring pills in original pharmacy bottle. Always bring newest bottle. Bring bottle, even if empty. 3. Medication refills: You are responsible for knowing and keeping track of what medications you need refilled. The day before your appointment, write a list of all prescriptions that need to be refilled. Bring that list to your appointment and give it to the admitting nurse. Prescriptions will be written only during appointments. If you forget a medication, it will not be "Called in", "Faxed", or "electronically sent". You will need to get another appointment to get these prescribed. 4. Prescription Accuracy: You are responsible for carefully inspecting your prescriptions before leaving our office. Have the discharge nurse carefully go over each prescription with you, before taking them home. Make sure that your name is accurately spelled, that your address is correct. Check the name and dose of your medication to make sure it is accurate. Check the number of pills, and the written instructions to make sure they are clear and accurate. Make sure that you are given enough medication to last until your next medication refill appointment. 5. Taking Medication: Take medication as prescribed. Never take more pills than instructed. Never take medication more frequently than prescribed. Taking less pills or less frequently is permitted and encouraged, when it comes to controlled substances (written prescriptions).  6. Inform other Doctors: Always inform, all of your healthcare providers, of all the medications you take. 7. Pain Medication from other Providers: You are not allowed to accept any additional pain medication from any other Doctor or Healthcare provider. There  are two exceptions to this rule. (see below) In the event that you require additional pain medication, you are responsible for notifying us, as stated below. 8. Medication Agreement: You are responsible for carefully reading and following our Medication Agreement. This must be signed before receiving any prescriptions from our practice. Safely store a copy of your signed Agreement. Violations to the Agreement will result in no further prescriptions. (Additional copies of our Medication Agreement are available upon request.) 9. Laws, Rules, & Regulations: All patients are expected to follow all Federal and [employer], Rules, [employer]. Ignorance of the Laws does not constitute a valid excuse. The use of any illegal substances is prohibited. 10. Adopted CDC guidelines & recommendations: Target dosing levels will be at or below 60 MME/day. Use of benzodiazepines** is not recommended.  Exceptions: There are only two exceptions to the rule of not receiving pain medications from  other Healthcare Providers. 1. Exception #1 (Emergencies): In the event of an emergency (i.e.: accident requiring emergency care), you are allowed to receive additional pain medication. However, you are responsible for: As soon as you are able, call our office [phone], at any time of the day or night, and leave a message stating your name, the date and nature of the emergency, and the name and dose of the medication prescribed. In the event that your call is answered by a member of our staff, make sure to document and save the date, time, and the name of the person that took your information.  2. Exception #2 (Planned Surgery): In the event that you are scheduled by another doctor or dentist to have any type of surgery or procedure, you are allowed (for a period no longer than 30 days), to receive additional pain medication, for the acute post-op pain. However, in this case, you are responsible for picking up a copy of  our "Post-op Pain Management for Surgeons" handout, and giving it to your surgeon or dentist. This document is available at our office, and does not require an appointment to obtain it. Simply go to our office during business hours (Monday-Thursday from 8:00 AM to 4:00 PM) (Friday 8:00 AM to 12:00 Noon) or if you have a scheduled appointment with [place], prior to your surgery, and ask for it by name. In addition, you will need to provide [place] with your name, name of your surgeon, type of surgery, and date of procedure or surgery.  *Opioid medications include: morphine, codeine, oxycodone, oxymorphone, hydrocodone, hydromorphone, meperidine, tramadol, tapentadol, buprenorphine, fentanyl, methadone. **Benzodiazepine medications include: diazepam (Valium), alprazolam (Xanax), clonazepam (Klonopine), lorazepam (Ativan), clorazepate (Tranxene), chlordiazepoxide (Librium), estazolam (Prosom), oxazepam (Serax), temazepam (Restoril), triazolam (Halcion) (Last updated: 03/08/2017) ____________________________________________________________________________________________   BMI Assessment: Estimated body mass index is 56.15 kg/m as calculated from the following:   Height as of this encounter: 5' 2"  (1.575 m).   Weight as of this encounter: 307 lb (139.3 kg).  BMI interpretation table: BMI level Category Range association with higher incidence of chronic pain  <18 kg/m2 Underweight   18.5-24.9 kg/m2 Ideal body weight   25-29.9 kg/m2 Overweight Increased incidence by 20%  30-34.9 kg/m2 Obese (Class I) Increased incidence by 68%  35-39.9 kg/m2 Severe obesity (Class II) Increased incidence by 136%  >40 kg/m2 Extreme obesity (Class III) Increased incidence by 254%   Patient's current BMI Ideal Body weight  Body mass index is 56.15 kg/m. Ideal body weight: 50.1 kg (110 lb 7.2 oz) Adjusted ideal body weight: 85.8 kg (189 lb 1.1 oz)   BMI Readings from Last 4 Encounters:  11/07/17 56.15 kg/m  07/24/17 57.61  kg/m  04/24/17 57.61 kg/m  02/01/17 57.61 kg/m   Wt Readings from Last 4 Encounters:  11/07/17 (!) 307 lb (139.3 kg)  07/24/17 (!) 315 lb (142.9 kg)  04/24/17 (!) 315 lb (142.9 kg)  02/01/17 (!) 315 lb (142.9 kg)   Hydrocodone - apap 5-325 mg x 3 months to begin filling on 11/20/17 escribed  MS Contin 69m x 3 months to begin filling on 11/20/17 escribed.

## 2017-11-07 NOTE — Progress Notes (Signed)
Nursing Pain Medication Assessment:  Safety precautions to be maintained throughout the outpatient stay will include: orient to surroundings, keep bed in low position, maintain call bell within reach at all times, provide assistance with transfer out of bed and ambulation.  Medication Inspection Compliance: Pill count conducted under aseptic conditions, in front of the patient. Neither the pills nor the bottle was removed from the patient's sight at any time. Once count was completed pills were immediately returned to the patient in their original bottle.  Medication #1: Morphine ER (MSContin) Pill/Patch Count: 42 of 90 pills remain Pill/Patch Appearance: Markings consistent with prescribed medication Bottle Appearance: Standard pharmacy container. Clearly labeled. Filled Date: 10 / 12 / 2019 Last Medication intake:  Today  Medication #2: Hydrocodone/APAP Pill/Patch Count: 59 of 120 pills remain Pill/Patch Appearance: Markings consistent with prescribed medication Bottle Appearance: Standard pharmacy container. Clearly labeled. Filled Date: 10 / 12 / 2019 Last Medication intake:  Today

## 2017-11-26 DIAGNOSIS — G35 Multiple sclerosis: Secondary | ICD-10-CM | POA: Diagnosis not present

## 2018-02-06 ENCOUNTER — Encounter: Payer: Self-pay | Admitting: Nurse Practitioner

## 2018-02-06 ENCOUNTER — Other Ambulatory Visit: Payer: Self-pay

## 2018-02-06 ENCOUNTER — Ambulatory Visit: Payer: BLUE CROSS/BLUE SHIELD | Attending: Nurse Practitioner | Admitting: Nurse Practitioner

## 2018-02-06 VITALS — BP 124/74 | HR 99 | Temp 98.2°F | Resp 18 | Ht 62.0 in | Wt 310.0 lb

## 2018-02-06 DIAGNOSIS — M1612 Unilateral primary osteoarthritis, left hip: Secondary | ICD-10-CM

## 2018-02-06 DIAGNOSIS — M797 Fibromyalgia: Secondary | ICD-10-CM | POA: Diagnosis not present

## 2018-02-06 DIAGNOSIS — M899 Disorder of bone, unspecified: Secondary | ICD-10-CM | POA: Insufficient documentation

## 2018-02-06 DIAGNOSIS — Z79891 Long term (current) use of opiate analgesic: Secondary | ICD-10-CM | POA: Diagnosis present

## 2018-02-06 DIAGNOSIS — G894 Chronic pain syndrome: Secondary | ICD-10-CM | POA: Insufficient documentation

## 2018-02-06 DIAGNOSIS — M47816 Spondylosis without myelopathy or radiculopathy, lumbar region: Secondary | ICD-10-CM | POA: Insufficient documentation

## 2018-02-06 DIAGNOSIS — Z79899 Other long term (current) drug therapy: Secondary | ICD-10-CM | POA: Diagnosis present

## 2018-02-06 DIAGNOSIS — Z789 Other specified health status: Secondary | ICD-10-CM | POA: Insufficient documentation

## 2018-02-06 MED ORDER — HYDROCODONE-ACETAMINOPHEN 5-325 MG PO TABS: 1.0000 | ORAL_TABLET | Freq: Four times a day (QID) | ORAL | 0 refills | Status: DC | PRN

## 2018-02-06 MED ORDER — MORPHINE SULFATE ER 30 MG PO TBCR: 30.0000 mg | EXTENDED_RELEASE_TABLET | Freq: Three times a day (TID) | ORAL | 0 refills | Status: DC | PRN

## 2018-02-06 NOTE — Patient Instructions (Addendum)
____________________________________________________________________________________________  Medication Rules  Purpose: To inform patients, and their family members, of our rules and regulations.  Applies to: All patients receiving prescriptions (written or electronic).  Pharmacy of record: Pharmacy where electronic prescriptions will be sent. If written prescriptions are taken to a different pharmacy, please inform the nursing staff. The pharmacy listed in the electronic medical record should be the one where you would like electronic prescriptions to be sent.  Electronic prescriptions: In compliance with the [place] Strengthen Opioid Misuse Prevention (STOP) Act of 2017 (Session Law 2017-74/H243), effective January 09, 2018, all controlled substances must be electronically prescribed. Calling prescriptions to the pharmacy will cease to exist.  Prescription refills: Only during scheduled appointments. Applies to all prescriptions.  NOTE: The following applies primarily to controlled substances (Opioid* Pain Medications).   Patient's responsibilities: 1. Pain Pills: Bring all pain pills to every appointment (except for procedure appointments). 2. Pill Bottles: Bring pills in original pharmacy bottle. Always bring the newest bottle. Bring bottle, even if empty. 3. Medication refills: You are responsible for knowing and keeping track of what medications you take and those you need refilled. The day before your appointment: write a list of all prescriptions that need to be refilled. The day of the appointment: give the list to the admitting nurse. Prescriptions will be written only during appointments. If you forget a medication: it will not be "Called in", "Faxed", or "electronically sent". You will need to get another appointment to get these prescribed. No early refills. Do not call asking to have your prescription filled early. 4. Prescription Accuracy: You are responsible for  carefully inspecting your prescriptions before leaving our office. Have the discharge nurse carefully go over each prescription with you, before taking them home. Make sure that your name is accurately spelled, that your address is correct. Check the name and dose of your medication to make sure it is accurate. Check the number of pills, and the written instructions to make sure they are clear and accurate. Make sure that you are given enough medication to last until your next medication refill appointment. 5. Taking Medication: Take medication as prescribed. When it comes to controlled substances, taking less pills or less frequently than prescribed is permitted and encouraged. Never take more pills than instructed. Never take medication more frequently than prescribed.  6. Inform other Doctors: Always inform, all of your healthcare providers, of all the medications you take. 7. Pain Medication from other Providers: You are not allowed to accept any additional pain medication from any other Doctor or Healthcare provider. There are two exceptions to this rule. (see below) In the event that you require additional pain medication, you are responsible for notifying us, as stated below. 8. Medication Agreement: You are responsible for carefully reading and following our Medication Agreement. This must be signed before receiving any prescriptions from our practice. Safely store a copy of your signed Agreement. Violations to the Agreement will result in no further prescriptions. (Additional copies of our Medication Agreement are available upon request.) 9. Laws, Rules, & Regulations: All patients are expected to follow all Federal and State Laws, Statutes, Rules, & Regulations. Ignorance of the Laws does not constitute a valid excuse. The use of any illegal substances is prohibited. 10. Adopted CDC guidelines & recommendations: Target dosing levels will be at or below 60 MME/day. Use of benzodiazepines** is not  recommended.  Exceptions: There are only two exceptions to the rule of not receiving pain medications from other Healthcare Providers. 1.   Exception #1 (Emergencies): In the event of an emergency (i.e.: accident requiring emergency care), you are allowed to receive additional pain medication. However, you are responsible for: As soon as you are able, call our office [phone], at any time of the day or night, and leave a message stating your name, the date and nature of the emergency, and the name and dose of the medication prescribed. In the event that your call is answered by a member of our staff, make sure to document and save the date, time, and the name of the person that took your information.  2. Exception #2 (Planned Surgery): In the event that you are scheduled by another doctor or dentist to have any type of surgery or procedure, you are allowed (for a period no longer than 30 days), to receive additional pain medication, for the acute post-op pain. However, in this case, you are responsible for picking up a copy of our "Post-op Pain Management for Surgeons" handout, and giving it to your surgeon or dentist. This document is available at our office, and does not require an appointment to obtain it. Simply go to our office during business hours (Monday-Thursday from 8:00 AM to 4:00 PM) (Friday 8:00 AM to 12:00 Noon) or if you have a scheduled appointment with [place], prior to your surgery, and ask for it by name. In addition, you will need to provide [place] with your name, name of your surgeon, type of surgery, and date of procedure or surgery.  *Opioid medications include: morphine, codeine, oxycodone, oxymorphone, hydrocodone, hydromorphone, meperidine, tramadol, tapentadol, buprenorphine, fentanyl, methadone. **Benzodiazepine medications include: diazepam (Valium), alprazolam (Xanax), clonazepam (Klonopine), lorazepam (Ativan), clorazepate (Tranxene), chlordiazepoxide (Librium), estazolam (Prosom),  oxazepam (Serax), temazepam (Restoril), triazolam (Halcion) (Last updated: 03/08/2017) ____________________________________________________________________________________________    BMI Assessment: Estimated body mass index is 56.7 kg/m as calculated from the following:   Height as of this encounter: 5\' 2"  (1.575 m).   Weight as of this encounter: 310 lb (140.6 kg).  BMI interpretation table: BMI level Category Range association with higher incidence of chronic pain  <18 kg/m2 Underweight   18.5-24.9 kg/m2 Ideal body weight   25-29.9 kg/m2 Overweight Increased incidence by 20%  30-34.9 kg/m2 Obese (Class I) Increased incidence by 68%  35-39.9 kg/m2 Severe obesity (Class II) Increased incidence by 136%  >40 kg/m2 Extreme obesity (Class III) Increased incidence by 254%   Patient's current BMI Ideal Body weight  Body mass index is 56.7 kg/m. Ideal body weight: 50.1 kg (110 lb 7.2 oz) Adjusted ideal body weight: 86.3 kg (190 lb 4.3 oz)   BMI Readings from Last 4 Encounters:  02/06/18 56.70 kg/m  11/07/17 56.15 kg/m  07/24/17 57.61 kg/m  04/24/17 57.61 kg/m   Wt Readings from Last 4 Encounters:  02/06/18 (!) 310 lb (140.6 kg)  11/07/17 (!) 307 lb (139.3 kg)  07/24/17 (!) 315 lb (142.9 kg)  04/24/17 (!) 315 lb (142.9 kg)

## 2018-02-06 NOTE — Progress Notes (Signed)
Nursing Pain Medication Assessment:  Safety precautions to be maintained throughout the outpatient stay will include: orient to surroundings, keep bed in low position, maintain call bell within reach at all times, provide assistance with transfer out of bed and ambulation.  Medication Inspection Compliance: Pill count conducted under aseptic conditions, in front of the patient. Neither the pills nor the bottle was removed from the patient's sight at any time. Once count was completed pills were immediately returned to the patient in their original bottle.  Medication #1: Morphine ER (MSContin) Pill/Patch Count: 41 of 90 pills remain Pill/Patch Appearance: Markings consistent with prescribed medication Bottle Appearance: Standard pharmacy container. Clearly labeled. Filled Date: 01 / 11 / 2020 Last Medication intake:  Today  Medication #2: Hydrocodone/APAP Pill/Patch Count: 54 of 120 pills remain Pill/Patch Appearance: Markings consistent with prescribed medication Bottle Appearance: Standard pharmacy container. Clearly labeled. Filled Date: 01 /11 / 2020 Last Medication intake:  Today

## 2018-02-06 NOTE — Progress Notes (Signed)
Patient's Name: Tiffany Herring  MRN: [id number]  Referring Provider: Hortencia Pilar, MD  DOB: [birth year]  PCP: Hortencia Pilar, MD  DOS: 02/06/2018  Note by: Vevelyn Francois NP  Service setting: Ambulatory outpatient  Specialty: Interventional Pain Management  Location: [facility] (AMB) Pain Management Facility    Patient type: Established    Primary Reason(s) for Visit: Encounter for prescription drug management. (Level of risk: moderate)  CC: Shoulder Pain (left) and Hip Pain (left)  HPI  Tiffany Herring is a 58 y.o. year old, female patient, who comes today for a medication management evaluation. She has Failed back surgical syndrome; Epidural fibrosis; Chronic bilateral low back pain with right-sided sciatica; Chronic lumbar radicular pain (Right); Long term current use of opiate analgesic; Long term prescription opiate use; Opiate use (80 MME/Day); Opiate dependence ([place]); Encounter for therapeutic drug level monitoring; Allergic rhinitis; Carotid artery bruit; Chronic kidney disease (CKD), stage III (moderate) ([place]); Colon polyp; Diabetes mellitus ([place]); Dysthymia; Essential (primary) hypertension; Generalized OA; Acid reflux; History of intermenstrual bleeding; History of migraine; H/O nutritional disorder; Hypercholesterolemia; Type 2 diabetes mellitus treated with insulin ([place]); Other long term (current) drug therapy; Menopausal and perimenopausal disorder; Class III Morbid obesity ([place]) (254% higher incidence of chronic low back pain); Multiple sclerosis ([place]); Obstructive apnea; Degenerative arthritis of hip; Awareness of heartbeats; History of colon polyps; Post menopausal syndrome; Pure hypercholesterolemia; B12 deficiency; Fibromyalgia; Lumbar spondylosis; Grade 1 Anterolisthesis of L4 over L5; Chronic sacroiliac joint pain (bilateral); Chronic hip pain (Left); Arthropathy of left hip (Severe chronic left hip DJD); Osteoarthritis of hip (Left); Lumbar facet arthropathy ([place]); Lumbar facet hypertrophy;  Lumbar facet syndrome; Chronic flank pain (Right); Chronic lower extremity pain (Right); Breast cancer screening; Chronic pain syndrome; Morbid obesity with BMI of 50.0-59.9, adult ([place]); Post-menopausal; Venous stasis ulcer ([place]); Vitamin D deficiency; GERD (gastroesophageal reflux disease); OA (osteoarthritis); Palpitations; Vitamin B12 deficiency; Carotid bruit; CKD (chronic kidney disease); Osteoarthrosis, hip; Type 2 diabetes mellitus with stage 3 chronic kidney disease, with long-term current use of insulin ([place]); Long term use of drug; Splenomegaly; Essential hypertension with goal blood pressure less than 140/90; Generalized osteoarthritis; History of colonic polyps; History of irregular menstrual bleeding; Menopausal and postmenopausal disorder; Obstructive sleep apnea; Pharmacologic therapy; Disorder of skeletal system; and Problems influencing health status on their problem list. Her primarily concern today is the Shoulder Pain (left) and Hip Pain (left)  Pain Assessment: Location: Left Shoulder Radiating: pain hurts in different places Onset: More than a month ago Duration: Chronic pain Quality: Aching, Throbbing Severity: 4 /10 (subjective, self-reported pain score)  Note: Reported level is compatible with observation.                          Effect on ADL: Limits activities Timing: Constant Modifying factors: meds BP: 124/74  HR: 99  Tiffany Herring was last scheduled for an appointment on 11/07/2017 for medication management. During today's appointment we reviewed Tiffany Herring's chronic pain status, as well as her outpatient medication regimen.  She denies any changes in her pain.  She denies any current concerns.  She denies any side effects of her medication.  She admits that she will have to change pharmacies secondary to her insurance to [place].  The patient  reports no history of drug use. Her body mass index is 56.7 kg/m.  Further details on both, my assessment(s), as  well as the proposed treatment plan, please see below.  Controlled Substance Pharmacotherapy Assessment REMS (Risk  Evaluation and Mitigation Strategy)  Analgesic:Morphine ER 30 mg every 8 hours (90 mg/day) + hydrocodone/APAP 5/325 one every 6 hours (20 mg/dayof hydrocodone) MME/day:131m/day. TDewayne Shorter RN  02/06/2018  1:58 PM  Signed Nursing Pain Medication Assessment:  Safety precautions to be maintained throughout the outpatient stay will include: orient to surroundings, keep bed in low position, maintain call bell within reach at all times, provide assistance with transfer out of bed and ambulation.  Medication Inspection Compliance: Pill count conducted under aseptic conditions, in front of the patient. Neither the pills nor the bottle was removed from the patient's sight at any time. Once count was completed pills were immediately returned to the patient in their original bottle.  Medication #1: Morphine ER (MSContin) Pill/Patch Count: 41 of 90 pills remain Pill/Patch Appearance: Markings consistent with prescribed medication Bottle Appearance: Standard pharmacy container. Clearly labeled. Filled Date: 01 / 11 / 2020 Last Medication intake:  Today  Medication #2: Hydrocodone/APAP Pill/Patch Count: 54 of 120 pills remain Pill/Patch Appearance: Markings consistent with prescribed medication Bottle Appearance: Standard pharmacy container. Clearly labeled. Filled Date: 01 /11 / 2020 Last Medication intake:  Today   Pharmacokinetics: Liberation and absorption (onset of action): WNL Distribution (time to peak effect): WNL Metabolism and excretion (duration of action): WNL         Pharmacodynamics: Desired effects: Analgesia: Ms. SZervasreports >50% benefit. Functional ability: Patient reports that medication allows her to accomplish basic ADLs Clinically meaningful improvement in function (CMIF): Sustained CMIF goals met Perceived effectiveness: Described as relatively  effective, allowing for increase in activities of daily living (ADL) Undesirable effects: Side-effects or Adverse reactions: None reported Monitoring: [place] PMP: Online review of the past 120-montheriod conducted. Compliant with practice rules and regulations Last UDS on record: Summary  Date Value Ref Range Status  04/24/2017 FINAL  Final    Comment:    ==================================================================== TOXASSURE SELECT 13 (MW) ==================================================================== Test                             Result       Flag       Units Drug Present and Declared for Prescription Verification   Morphine                       >1>[id number]     EXPECTED   ng/mg creat   Normorphine                    264          EXPECTED   ng/mg creat    Potential sources of large amounts of morphine in the absence of    codeine include administration of morphine or use of heroin.    Normorphine is an expected metabolite of morphine.   Hydrocodone                    1079         EXPECTED   ng/mg creat   Hydromorphone                  99           EXPECTED   ng/mg creat   Norhydrocodone                 1047         EXPECTED   ng/mg creat    Sources of  hydrocodone include scheduled prescription    medications. Hydromorphone and norhydrocodone are expected    metabolites of hydrocodone. Hydromorphone is also available as a    scheduled prescription medication. ==================================================================== Test                      Result    Flag   Units      Ref Range   Creatinine              81               mg/dL      >=20 ==================================================================== Declared Medications:  The flagging and interpretation on this report are based on the  following declared medications.  Unexpected results may arise from  inaccuracies in the declared medications.  **Note: The testing scope of this panel includes these  medications:  Hydrocodone (Hydrocodone-Acetaminophen)  Morphine  **Note: The testing scope of this panel does not include following  reported medications:  Acetaminophen (Hydrocodone-Acetaminophen)  Albuterol  Aspirin (Aspirin 81)  Bupropion  Cholecalciferol  Clotrimazole  Cyanocobalamin  Furosemide  Gabapentin  Insulin  Mirtazapine  Montelukast  Oxybutynin  Tizanidine  Triamcinolone ==================================================================== For clinical consultation, please call [phone]. ====================================================================    UDS interpretation: Compliant          Medication Assessment Form: Reviewed. Patient indicates being compliant with therapy Treatment compliance: Compliant Risk Assessment Profile: Aberrant behavior: See prior evaluations. None observed or detected today Comorbid factors increasing risk of overdose: See prior notes. No additional risks detected today Opioid risk tool (ORT) (Total Score): 1 Personal History of Substance Abuse (SUD-Substance use disorder):  Alcohol: Negative  Illegal Drugs: Negative  Rx Drugs: Negative  ORT Risk Level calculation: Low Risk Risk of substance use disorder (SUD): Low Opioid Risk Tool - 02/06/18 1357      Family History of Substance Abuse   Alcohol  Negative    Illegal Drugs  Negative    Rx Drugs  Negative      Personal History of Substance Abuse   Alcohol  Negative    Illegal Drugs  Negative    Rx Drugs  Negative      Age   Age between 45-45 years   No      History of Preadolescent Sexual Abuse   History of Preadolescent Sexual Abuse  Negative or Female      Psychological Disease   Psychological Disease  Negative    Depression  Positive      Total Score   Opioid Risk Tool Scoring  1    Opioid Risk Interpretation  Low Risk      ORT Scoring interpretation table:  Score <3 = Low Risk for SUD  Score between 4-7 = Moderate Risk for SUD  Score >8 = High  Risk for Opioid Abuse   Risk Mitigation Strategies:  Patient Counseling: Covered Patient-Prescriber Agreement (PPA): Present and active  Notification to other healthcare providers: Done  Pharmacologic Plan: No change in therapy, at this time.             Laboratory Chemistry  Inflammation Markers (CRP: Acute Phase) (ESR: Chronic Phase) Lab Results  Component Value Date   CRP 2.0 (H) 02/15/2015   ESRSEDRATE 28 02/15/2015                         Rheumatology Markers No results found for: RF, ANA, [place], [place], [place], [place], HLAB27  Renal Function Markers Lab Results  Component Value Date   BUN 27 (H) 02/15/2015   CREATININE 1.42 (H) 02/15/2015   GFRAA 47 (L) 02/15/2015   GFRNONAA 41 (L) 02/15/2015                             Hepatic Function Markers Lab Results  Component Value Date   AST 16 02/15/2015   ALT 19 02/15/2015   ALBUMIN 4.3 02/15/2015   ALKPHOS 77 02/15/2015                        Electrolytes Lab Results  Component Value Date   NA 138 02/15/2015   K 4.8 02/15/2015   CL 102 02/15/2015   CALCIUM 9.4 02/15/2015   MG 2.0 02/15/2015                        Neuropathy Markers No results found for: VITAMINB12, FOLATE, HGBA1C, HIV                      CNS Tests No results found for: COLORCSF, APPEARCSF, RBCCOUNTCSF, WBCCSF, POLYSCSF, LYMPHSCSF, EOSCSF, PROTEINCSF, GLUCCSF, JCVIRUS, CSFOLI, IGGCSF                      Bone Pathology Markers No results found for: VD25OH, [id number], ZG0174BS4, [id number], 25OHVITD1, 25OHVITD2, 25OHVITD3, TESTOFREE, TESTOSTERONE                       Coagulation Parameters No results found for: INR, LABPROT, APTT, PLT, DDIMER, LABHEMA, VITAMINK1                      Cardiovascular Markers No results found for: BNP, CKTOTAL, CKMB, TROPONINI, HGB, HCT                       CA Markers No results found for: CEA, CA125, LABCA2                      Note: Lab results reviewed.  Recent  Diagnostic Imaging Results   Complexity Note: Imaging results reviewed. Results shared with Tiffany Herring, using [employer].                         Meds   Current Outpatient Medications:  .  albuterol (PROAIR HFA) 108 (90 Base) MCG/ACT inhaler, Inhale 1 puff into the lungs as needed. , Disp: , Rfl:  .  aspirin 81 MG chewable tablet, Chew 81 mg by mouth daily., Disp: , Rfl:  .  B-D INS SYR ULTRAFINE 1CC/30G 30G X 1/2" 1 ML MISC, , Disp: , Rfl:  .  Blood Glucose Monitoring Suppl (FIFTY50 GLUCOSE METER 2.0) w/Device KIT, Check sugars three times daily.  On insulin.  E11.9, Disp: , Rfl:  .  buPROPion (WELLBUTRIN SR) 150 MG 12 hr tablet, Take 150 mg by mouth 2 (two) times daily. , Disp: , Rfl:  .  Cholecalciferol (VITAMIN D3) 5000 units CAPS, Take 1 capsule by mouth daily. , Disp: , Rfl:  .  clotrimazole (MYCELEX) 10 MG troche, Take 1 tablet by mouth 5 (five) times daily as needed., Disp: , Rfl:  .  enalapril (VASOTEC) 20 MG tablet, Take 20 mg by mouth daily., Disp: , Rfl:  .  furosemide (LASIX)  20 MG tablet, Take 20 mg by mouth daily. , Disp: , Rfl:  .  gabapentin (NEURONTIN) 600 MG tablet, 600 mg 3 (three) times daily. , Disp: , Rfl:  .  Glucose Blood (BLOOD GLUCOSE TEST STRIPS) STRP, 1 each by Other route as needed. Use as instructed, Disp: , Rfl:  .  glucose blood ([employer] TEST STRIPS) test strip, Check sugars four times daily.  On insulin.  E11.65, Disp: , Rfl:  .  insulin NPH-regular Human (NOVOLIN 70/30) (70-30) 100 UNIT/ML injection, Inject 88 Units into the skin daily with supper. , Disp: , Rfl:  .  insulin regular (NOVOLIN R,HUMULIN R) 100 units/mL injection, SSI: 151-200: 2 units, 201-250: 4 units, 251-300:6 units, 301-350:8 units, 351-400: 10 units > 400 call MD, Disp: , Rfl:  .  Lancets (ACCU-CHEK MULTICLIX) lancets, 1 each., Disp: , Rfl:  .  mirtazapine (REMERON) 15 MG tablet, Take 15 mg by mouth at bedtime. , Disp: , Rfl:  .  montelukast (SINGULAIR) 10 MG tablet, Take 10 mg by  mouth daily. , Disp: , Rfl:  .  oxybutynin (DITROPAN XL) 10 MG 24 hr tablet, Take 10 mg by mouth daily. , Disp: , Rfl:  .  tiZANidine (ZANAFLEX) 4 MG tablet, Take 4 mg by mouth every 6 (six) hours. , Disp: , Rfl:  .  triamcinolone cream (KENALOG) 0.5 %, Apply 1 application topically 2 (two) times daily. , Disp: , Rfl:  .  vitamin B-12 (CYANOCOBALAMIN) 1000 MCG tablet, 1,000 mcg. Take 1,000 mcg by mouth daily., Disp: , Rfl:  .  [START ON 04/19/2018] HYDROcodone-acetaminophen (NORCO/VICODIN) 5-325 MG tablet, Take 1 tablet by mouth every 6 (six) hours as needed for up to 30 days for severe pain., Disp: 120 tablet, Rfl: 0 .  [START ON 03/20/2018] HYDROcodone-acetaminophen (NORCO/VICODIN) 5-325 MG tablet, Take 1 tablet by mouth every 6 (six) hours as needed for up to 30 days for severe pain., Disp: 120 tablet, Rfl: 0 .  [START ON 02/18/2018] HYDROcodone-acetaminophen (NORCO/VICODIN) 5-325 MG tablet, Take 1 tablet by mouth every 6 (six) hours as needed for up to 30 days for severe pain., Disp: 120 tablet, Rfl: 0 .  [START ON 04/19/2018] morphine (MS CONTIN) 30 MG 12 hr tablet, Take 1 tablet (30 mg total) by mouth every 8 (eight) hours as needed for up to 30 days for pain., Disp: 90 tablet, Rfl: 0 .  [START ON 03/20/2018] morphine (MS CONTIN) 30 MG 12 hr tablet, Take 1 tablet (30 mg total) by mouth every 8 (eight) hours as needed for up to 30 days for pain., Disp: 90 tablet, Rfl: 0 .  [START ON 02/18/2018] morphine (MS CONTIN) 30 MG 12 hr tablet, Take 1 tablet (30 mg total) by mouth every 8 (eight) hours as needed for up to 30 days for pain., Disp: 90 tablet, Rfl: 0  ROS  Constitutional: Denies any fever or chills Gastrointestinal: No reported hemesis, hematochezia, vomiting, or acute GI distress Musculoskeletal: Denies any acute onset joint swelling, redness, loss of ROM, or weakness Neurological: No reported episodes of acute onset apraxia, aphasia, dysarthria, agnosia, amnesia, paralysis, loss of coordination,  or loss of consciousness  Allergies  Tiffany Herring is allergic to amoxicillin-pot clavulanate; copaxone  [glatiramer acetate]; pseudoephedrine; rofecoxib; sulfa antibiotics; sulfamethoxazole-trimethoprim; and meloxicam.  PFSH  Drug: Tiffany Herring  reports no history of drug use. Alcohol:  reports no history of alcohol use. Tobacco:  reports that she has quit smoking. She has never used smokeless tobacco. Medical:  has a past  medical history of Allergy, Arthritis, Asthma, Chronic kidney disease, Depression, Diabetes mellitus without complication ([place]), Hypertension, Multiple sclerosis ([place]), Neuromuscular disorder ([place]), and Sleep apnea. Surgical: Tiffany Herring  has a past surgical history that includes Cesarean section (1991 and 1996). Family: family history includes Dementia in her mother; Depression in her mother; Diabetes in her mother; Hyperlipidemia in her mother; Stroke in her father.  Constitutional Exam  General appearance: Well nourished, well developed, and well hydrated. In no apparent acute distress Vitals:   02/06/18 1351 02/06/18 1352  BP:  124/74  Pulse: 99   Resp: 18   Temp: 98.2 F (36.8 C)   SpO2: 100%   Weight: (!) 310 lb (140.6 kg)   Height: _0  (1.575 m)   Psych/Mental status: Alert, oriented x 3 (person, place, & time)       Eyes: PERLA Respiratory: No evidence of acute respiratory distress  Cervical Spine Area Exam  Skin & Axial Inspection: No masses, redness, edema, swelling, or associated skin lesions Alignment: Symmetrical Functional ROM: Unrestricted ROM      Stability: No instability detected Muscle Tone/Strength: Functionally intact. No obvious neuro-muscular anomalies detected. Sensory (Neurological): Unimpaired Palpation: No palpable anomalies              Upper Extremity (UE) Exam    Side: Right upper extremity  Side: Left upper extremity  Skin & Extremity Inspection: Skin color, temperature, and hair growth are WNL. No peripheral edema or  cyanosis. No masses, redness, swelling, asymmetry, or associated skin lesions. No contractures.  Skin & Extremity Inspection: Skin color, temperature, and hair growth are WNL. No peripheral edema or cyanosis. No masses, redness, swelling, asymmetry, or associated skin lesions. No contractures.  Functional ROM: Unrestricted ROM          Functional ROM: Unrestricted ROM          Muscle Tone/Strength: Functionally intact. No obvious neuro-muscular anomalies detected.  Muscle Tone/Strength: Functionally intact. No obvious neuro-muscular anomalies detected.  Sensory (Neurological): Unimpaired          Sensory (Neurological): Unimpaired          Palpation: No palpable anomalies              Palpation: No palpable anomalies                   Thoracic Spine Area Exam  Skin & Axial Inspection: No masses, redness, or swelling Alignment: Symmetrical Functional ROM: Unrestricted ROM Stability: No instability detected Muscle Tone/Strength: Functionally intact. No obvious neuro-muscular anomalies detected. Sensory (Neurological): Unimpaired Muscle strength & Tone: No palpable anomalies  Lumbar Spine Area Exam  Skin & Axial Inspection: No masses, redness, or swelling Alignment: Symmetrical Functional ROM: Unrestricted ROM       Stability: No instability detected Muscle Tone/Strength: Functionally intact. No obvious neuro-muscular anomalies detected. Sensory (Neurological): Unimpaired Palpation: No palpable anomalies       Provocative Tests: Hyperextension/rotation test: deferred today       Lumbar quadrant test (Kemp's test): deferred today       Lateral bending test: deferred today       Patrick's Maneuver: deferred today                     Gait & Posture Assessment  Ambulation: Patient ambulates using a wheel chair Gait: Relatively normal for age and body habitus Posture: WNL   Lower Extremity Exam    Side: Right lower extremity  Side: Left lower extremity  Stability: No  instability  observed          Stability: No instability observed          Skin & Extremity Inspection: Skin color, temperature, and hair growth are WNL. No peripheral edema or cyanosis. No masses, redness, swelling, asymmetry, or associated skin lesions. No contractures.  Skin & Extremity Inspection: Skin color, temperature, and hair growth are WNL. No peripheral edema or cyanosis. No masses, redness, swelling, asymmetry, or associated skin lesions. No contractures.  Functional ROM: Unrestricted ROM                  Functional ROM: Unrestricted ROM                  Muscle Tone/Strength: Functionally intact. No obvious neuro-muscular anomalies detected.  Muscle Tone/Strength: Functionally intact. No obvious neuro-muscular anomalies detected.  Sensory (Neurological): Unimpaired        Sensory (Neurological): Unimpaired            Palpation: No palpable anomalies  Palpation: No palpable anomalies   Assessment  Primary Diagnosis & Pertinent Problem List: The primary encounter diagnosis was Lumbar spondylosis. Diagnoses of Fibromyalgia, Arthropathy of left hip (Severe chronic left hip DJD), Chronic pain syndrome, Long term current use of opiate analgesic, Pharmacologic therapy, Disorder of skeletal system, and Problems influencing health status were also pertinent to this visit.  Status Diagnosis  Controlled Controlled Controlled 1. Lumbar spondylosis   2. Fibromyalgia   3. Arthropathy of left hip (Severe chronic left hip DJD)   4. Chronic pain syndrome   5. Long term current use of opiate analgesic   6. Pharmacologic therapy   7. Disorder of skeletal system   8. Problems influencing health status     Problems updated and reviewed during this visit: Problem  Pharmacologic Therapy  Disorder of Skeletal System  Problems Influencing Health Status   Plan of Care  Pharmacotherapy (Medications Ordered): Meds ordered this encounter  Medications  . HYDROcodone-acetaminophen (NORCO/VICODIN) 5-325 MG tablet     Sig: Take 1 tablet by mouth every 6 (six) hours as needed for up to 30 days for severe pain.    Dispense:  120 tablet    Refill:  0    Do not place this medication, or any other prescription from our practice, on "Automatic Refill". Patient may have prescription filled one day early if pharmacy is closed on scheduled refill date.    Order Specific Question:   Supervising Provider    Answer:   Milinda Pointer [phone]  . HYDROcodone-acetaminophen (NORCO/VICODIN) 5-325 MG tablet    Sig: Take 1 tablet by mouth every 6 (six) hours as needed for up to 30 days for severe pain.    Dispense:  120 tablet    Refill:  0    Do not place this medication, or any other prescription from our practice, on "Automatic Refill". Patient may have prescription filled one day early if pharmacy is closed on scheduled refill date.    Order Specific Question:   Supervising Provider    Answer:   Milinda Pointer [phone]  . HYDROcodone-acetaminophen (NORCO/VICODIN) 5-325 MG tablet    Sig: Take 1 tablet by mouth every 6 (six) hours as needed for up to 30 days for severe pain.    Dispense:  120 tablet    Refill:  0    Do not place this medication, or any other prescription from our practice, on "Automatic Refill". Patient may have prescription filled one day early if pharmacy is  closed on scheduled refill date.    Order Specific Question:   Supervising Provider    Answer:   Milinda Pointer [phone]  . morphine (MS CONTIN) 30 MG 12 hr tablet    Sig: Take 1 tablet (30 mg total) by mouth every 8 (eight) hours as needed for up to 30 days for pain.    Dispense:  90 tablet    Refill:  0    Do not place this medication, or any other prescription from our practice, on "Automatic Refill". Patient may have prescription filled one day early if pharmacy is closed on scheduled refill date.    Order Specific Question:   Supervising Provider    Answer:   Milinda Pointer [phone]  . morphine (MS CONTIN) 30 MG 12 hr  tablet    Sig: Take 1 tablet (30 mg total) by mouth every 8 (eight) hours as needed for up to 30 days for pain.    Dispense:  90 tablet    Refill:  0    Do not place this medication, or any other prescription from our practice, on "Automatic Refill". Patient may have prescription filled one day early if pharmacy is closed on scheduled refill date.    Order Specific Question:   Supervising Provider    Answer:   Milinda Pointer [phone]  . morphine (MS CONTIN) 30 MG 12 hr tablet    Sig: Take 1 tablet (30 mg total) by mouth every 8 (eight) hours as needed for up to 30 days for pain.    Dispense:  90 tablet    Refill:  0    Do not place this medication, or any other prescription from our practice, on "Automatic Refill". Patient may have prescription filled one day early if pharmacy is closed on scheduled refill date.    Order Specific Question:   Supervising Provider    Answer:   Milinda Pointer [[id number]]   [place] Prescriptions   No medications on file   Medications administered today: Tiffany Herring had no medications administered during this visit. Lab-work, procedure(s), and/or referral(s): Orders Placed This Encounter  Procedures  . ToxASSURE Select 13 (MW), Urine  . Comp. Metabolic Panel (12)  . Magnesium  . Vitamin B12  . Sedimentation rate  . 25-Hydroxyvitamin D Lcms D2+D3  . C-reactive protein   Imaging and/or referral(s): None  Interventional therapies: Planned, scheduled, and/or pending: Not at this time.   Considering: Not at this time.   Palliative PRN treatment(s): Not at this time.    Provider-requested follow-up: Return in about 3 months (around 05/08/2018) for MedMgmt.  Future Appointments  Date Time Provider [place]  05/08/2018  1:30 PM Vevelyn Francois, NP [facility] None   Primary Care Physician: Hortencia Pilar, MD Location: [facility] Note by: Vevelyn Francois NP Date: 02/06/2018; Time: 7:29 PM  Pain  Score Disclaimer: We use the NRS-11 scale. This is a self-reported, subjective measurement of pain severity with only modest accuracy. It is used primarily to identify changes within a particular patient. It must be understood that outpatient pain scales are significantly less accurate that those used for research, where they can be applied under ideal controlled circumstances with minimal exposure to variables. In reality, the score is likely to be a combination of pain intensity and pain affect, where pain affect describes the degree of emotional arousal or changes in action readiness caused by the sensory experience of pain. Factors such as social and work situation, setting, emotional  state, anxiety levels, expectation, and prior pain experience may influence pain perception and show large inter-individual differences that may also be affected by time variables.  Patient instructions provided during this appointment: Patient Instructions   ____________________________________________________________________________________________  Medication Rules  Purpose: To inform patients, and their family members, of our rules and regulations.  Applies to: All patients receiving prescriptions (written or electronic).  Pharmacy of record: Pharmacy where electronic prescriptions will be sent. If written prescriptions are taken to a different pharmacy, please inform the nursing staff. The pharmacy listed in the electronic medical record should be the one where you would like electronic prescriptions to be sent.  Electronic prescriptions: In compliance with the [place] (STOP) Act of 2017 (Session Lanny Cramp [phone]), effective January 09, 2018, all controlled substances must be electronically prescribed. Calling prescriptions to the pharmacy will cease to exist.  Prescription refills: Only during scheduled appointments. Applies to all prescriptions.  NOTE: The  following applies primarily to controlled substances (Opioid* Pain Medications).   Patient's responsibilities: 1. Pain Pills: Bring all pain pills to every appointment (except for procedure appointments). 2. Pill Bottles: Bring pills in original pharmacy bottle. Always bring the newest bottle. Bring bottle, even if empty. 3. Medication refills: You are responsible for knowing and keeping track of what medications you take and those you need refilled. The day before your appointment: write a list of all prescriptions that need to be refilled. The day of the appointment: give the list to the admitting nurse. Prescriptions will be written only during appointments. If you forget a medication: it will not be "Called in", "Faxed", or "electronically sent". You will need to get another appointment to get these prescribed. No early refills. Do not call asking to have your prescription filled early. 4. Prescription Accuracy: You are responsible for carefully inspecting your prescriptions before leaving our office. Have the discharge nurse carefully go over each prescription with you, before taking them home. Make sure that your name is accurately spelled, that your address is correct. Check the name and dose of your medication to make sure it is accurate. Check the number of pills, and the written instructions to make sure they are clear and accurate. Make sure that you are given enough medication to last until your next medication refill appointment. 5. Taking Medication: Take medication as prescribed. When it comes to controlled substances, taking less pills or less frequently than prescribed is permitted and encouraged. Never take more pills than instructed. Never take medication more frequently than prescribed.  6. Inform other Doctors: Always inform, all of your healthcare providers, of all the medications you take. 7. Pain Medication from other Providers: You are not allowed to accept any additional pain  medication from any other Doctor or Healthcare provider. There are two exceptions to this rule. (see below) In the event that you require additional pain medication, you are responsible for notifying us, as stated below. 8. Medication Agreement: You are responsible for carefully reading and following our Medication Agreement. This must be signed before receiving any prescriptions from our practice. Safely store a copy of your signed Agreement. Violations to the Agreement will result in no further prescriptions. (Additional copies of our Medication Agreement are available upon request.) 9. Laws, Rules, & Regulations: All patients are expected to follow all Federal and [employer], Rules, [employer]. Ignorance of the Laws does not constitute a valid excuse. The use of any illegal substances is prohibited. 10. Adopted CDC guidelines & recommendations:  Target dosing levels will be at or below 60 MME/day. Use of benzodiazepines** is not recommended.  Exceptions: There are only two exceptions to the rule of not receiving pain medications from other Healthcare Providers. 1. Exception #1 (Emergencies): In the event of an emergency (i.e.: accident requiring emergency care), you are allowed to receive additional pain medication. However, you are responsible for: As soon as you are able, call our office [phone], at any time of the day or night, and leave a message stating your name, the date and nature of the emergency, and the name and dose of the medication prescribed. In the event that your call is answered by a member of our staff, make sure to document and save the date, time, and the name of the person that took your information.  2. Exception #2 (Planned Surgery): In the event that you are scheduled by another doctor or dentist to have any type of surgery or procedure, you are allowed (for a period no longer than 30 days), to receive additional pain medication, for the acute post-op pain.  However, in this case, you are responsible for picking up a copy of our "Post-op Pain Management for Surgeons" handout, and giving it to your surgeon or dentist. This document is available at our office, and does not require an appointment to obtain it. Simply go to our office during business hours (Monday-Thursday from 8:00 AM to 4:00 PM) (Friday 8:00 AM to 12:00 Noon) or if you have a scheduled appointment with [place], prior to your surgery, and ask for it by name. In addition, you will need to provide [place] with your name, name of your surgeon, type of surgery, and date of procedure or surgery.  *Opioid medications include: morphine, codeine, oxycodone, oxymorphone, hydrocodone, hydromorphone, meperidine, tramadol, tapentadol, buprenorphine, fentanyl, methadone. **Benzodiazepine medications include: diazepam (Valium), alprazolam (Xanax), clonazepam (Klonopine), lorazepam (Ativan), clorazepate (Tranxene), chlordiazepoxide (Librium), estazolam (Prosom), oxazepam (Serax), temazepam (Restoril), triazolam (Halcion) (Last updated: 03/08/2017) ____________________________________________________________________________________________    BMI Assessment: Estimated body mass index is 56.7 kg/m as calculated from the following:   Height as of this encounter: _0  (1.575 m).   Weight as of this encounter: 310 lb (140.6 kg).  BMI interpretation table: BMI level Category Range association with higher incidence of chronic pain  <18 kg/m2 Underweight   18.5-24.9 kg/m2 Ideal body weight   25-29.9 kg/m2 Overweight Increased incidence by 20%  30-34.9 kg/m2 Obese (Class I) Increased incidence by 68%  35-39.9 kg/m2 Severe obesity (Class II) Increased incidence by 136%  >40 kg/m2 Extreme obesity (Class III) Increased incidence by 254%   Patient's current BMI Ideal Body weight  Body mass index is 56.7 kg/m. Ideal body weight: 50.1 kg (110 lb 7.2 oz) Adjusted ideal body weight: 86.3 kg (190 lb 4.3 oz)   BMI  Readings from Last 4 Encounters:  02/06/18 56.70 kg/m  11/07/17 56.15 kg/m  07/24/17 57.61 kg/m  04/24/17 57.61 kg/m   Wt Readings from Last 4 Encounters:  02/06/18 (!) 310 lb (140.6 kg)  11/07/17 (!) 307 lb (139.3 kg)  07/24/17 (!) 315 lb (142.9 kg)  04/24/17 (!) 315 lb (142.9 kg)

## 2018-02-10 LAB — COMP. METABOLIC PANEL (12)
AST: 25 IU/L (ref 0–40)
Albumin/Globulin Ratio: 2.4 — ABNORMAL HIGH (ref 1.2–2.2)
Albumin: 4.6 g/dL (ref 3.8–4.9)
Alkaline Phosphatase: 97 IU/L (ref 39–117)
BUN/Creatinine Ratio: 15 (ref 9–23)
BUN: 18 mg/dL (ref 6–24)
Bilirubin Total: 0.2 mg/dL (ref 0.0–1.2)
Calcium: 9.8 mg/dL (ref 8.7–10.2)
Chloride: 95 mmol/L — ABNORMAL LOW (ref 96–106)
Creatinine, Ser: 1.22 mg/dL — ABNORMAL HIGH (ref 0.57–1.00)
GFR calc Af Amer: 56 mL/min/{1.73_m2} — ABNORMAL LOW (ref >59)
GFR calc non Af Amer: 49 mL/min/{1.73_m2} — ABNORMAL LOW (ref >59)
Globulin, Total: 1.9 g/dL (ref 1.5–4.5)
Glucose: 199 mg/dL — ABNORMAL HIGH (ref 65–99)
Potassium: 4.9 mmol/L (ref 3.5–5.2)
Sodium: 136 mmol/L (ref 134–144)
Total Protein: 6.5 g/dL (ref 6.0–8.5)

## 2018-02-10 LAB — 25-HYDROXY VITAMIN D LCMS D2+D3
25-Hydroxy, Vitamin D-2: 2.9 ng/mL
25-Hydroxy, Vitamin D-3: 52 ng/mL
25-Hydroxy, Vitamin D: 55 ng/mL

## 2018-02-10 LAB — MAGNESIUM: Magnesium: 1.8 mg/dL (ref 1.6–2.3)

## 2018-02-10 LAB — C-REACTIVE PROTEIN: CRP: 17 mg/L — ABNORMAL HIGH (ref 0–10)

## 2018-02-10 LAB — SEDIMENTATION RATE: Sed Rate: 28 mm/hr (ref 0–40)

## 2018-02-10 LAB — TOXASSURE SELECT 13 (MW), URINE

## 2018-02-10 LAB — VITAMIN B12: Vitamin B-12: 1484 pg/mL — ABNORMAL HIGH (ref 232–1245)

## 2018-05-08 ENCOUNTER — Ambulatory Visit: Payer: Medicare HMO | Attending: Nurse Practitioner | Admitting: Nurse Practitioner

## 2018-05-08 ENCOUNTER — Other Ambulatory Visit: Payer: Self-pay

## 2018-05-08 DIAGNOSIS — M797 Fibromyalgia: Secondary | ICD-10-CM | POA: Diagnosis not present

## 2018-05-08 DIAGNOSIS — M47816 Spondylosis without myelopathy or radiculopathy, lumbar region: Secondary | ICD-10-CM | POA: Diagnosis not present

## 2018-05-08 DIAGNOSIS — M1612 Unilateral primary osteoarthritis, left hip: Secondary | ICD-10-CM | POA: Diagnosis not present

## 2018-05-08 DIAGNOSIS — G894 Chronic pain syndrome: Secondary | ICD-10-CM | POA: Diagnosis not present

## 2018-05-08 MED ORDER — MORPHINE SULFATE ER 30 MG PO TBCR: 30.0000 mg | EXTENDED_RELEASE_TABLET | Freq: Three times a day (TID) | ORAL | 0 refills | Status: DC | PRN

## 2018-05-08 MED ORDER — HYDROCODONE-ACETAMINOPHEN 5-325 MG PO TABS: 1.0000 | ORAL_TABLET | Freq: Four times a day (QID) | ORAL | 0 refills | Status: DC | PRN

## 2018-05-08 NOTE — Patient Instructions (Signed)
____________________________________________________________________________________________  Medication Rules  Purpose: To inform patients, and their family members, of our rules and regulations.  Applies to: All patients receiving prescriptions (written or electronic).  Pharmacy of record: Pharmacy where electronic prescriptions will be sent. If written prescriptions are taken to a different pharmacy, please inform the nursing staff. The pharmacy listed in the electronic medical record should be the one where you would like electronic prescriptions to be sent.  Electronic prescriptions: In compliance with the [place] Strengthen Opioid Misuse Prevention (STOP) Act of 2017 (Session Law 2017-74/H243), effective January 09, 2018, all controlled substances must be electronically prescribed. Calling prescriptions to the pharmacy will cease to exist.  Prescription refills: Only during scheduled appointments. Applies to all prescriptions.  NOTE: The following applies primarily to controlled substances (Opioid* Pain Medications).   Patient's responsibilities: 1. Pain Pills: Bring all pain pills to every appointment (except for procedure appointments). 2. Pill Bottles: Bring pills in original pharmacy bottle. Always bring the newest bottle. Bring bottle, even if empty. 3. Medication refills: You are responsible for knowing and keeping track of what medications you take and those you need refilled. The day before your appointment: write a list of all prescriptions that need to be refilled. The day of the appointment: give the list to the admitting nurse. Prescriptions will be written only during appointments. No prescriptions will be written on procedure days. If you forget a medication: it will not be "Called in", "Faxed", or "electronically sent". You will need to get another appointment to get these prescribed. No early refills. Do not call asking to have your prescription filled  early. 4. Prescription Accuracy: You are responsible for carefully inspecting your prescriptions before leaving our office. Have the discharge nurse carefully go over each prescription with you, before taking them home. Make sure that your name is accurately spelled, that your address is correct. Check the name and dose of your medication to make sure it is accurate. Check the number of pills, and the written instructions to make sure they are clear and accurate. Make sure that you are given enough medication to last until your next medication refill appointment. 5. Taking Medication: Take medication as prescribed. When it comes to controlled substances, taking less pills or less frequently than prescribed is permitted and encouraged. Never take more pills than instructed. Never take medication more frequently than prescribed.  6. Inform other Doctors: Always inform, all of your healthcare providers, of all the medications you take. 7. Pain Medication from other Providers: You are not allowed to accept any additional pain medication from any other Doctor or Healthcare provider. There are two exceptions to this rule. (see below) In the event that you require additional pain medication, you are responsible for notifying us, as stated below. 8. Medication Agreement: You are responsible for carefully reading and following our Medication Agreement. This must be signed before receiving any prescriptions from our practice. Safely store a copy of your signed Agreement. Violations to the Agreement will result in no further prescriptions. (Additional copies of our Medication Agreement are available upon request.) 9. Laws, Rules, & Regulations: All patients are expected to follow all Federal and State Laws, Statutes, Rules, & Regulations. Ignorance of the Laws does not constitute a valid excuse. The use of any illegal substances is prohibited. 10. Adopted CDC guidelines & recommendations: Target dosing levels will be  at or below 60 MME/day. Use of benzodiazepines** is not recommended.  Exceptions: There are only two exceptions to the rule of not   receiving pain medications from other Healthcare Providers. 1. Exception #1 (Emergencies): In the event of an emergency (i.e.: accident requiring emergency care), you are allowed to receive additional pain medication. However, you are responsible for: As soon as you are able, call our office [phone], at any time of the day or night, and leave a message stating your name, the date and nature of the emergency, and the name and dose of the medication prescribed. In the event that your call is answered by a member of our staff, make sure to document and save the date, time, and the name of the person that took your information.  2. Exception #2 (Planned Surgery): In the event that you are scheduled by another doctor or dentist to have any type of surgery or procedure, you are allowed (for a period no longer than 30 days), to receive additional pain medication, for the acute post-op pain. However, in this case, you are responsible for picking up a copy of our "Post-op Pain Management for Surgeons" handout, and giving it to your surgeon or dentist. This document is available at our office, and does not require an appointment to obtain it. Simply go to our office during business hours (Monday-Thursday from 8:00 AM to 4:00 PM) (Friday 8:00 AM to 12:00 Noon) or if you have a scheduled appointment with us, prior to your surgery, and ask for it by name. In addition, you will need to provide us with your name, name of your surgeon, type of surgery, and date of procedure or surgery.  *Opioid medications include: morphine, codeine, oxycodone, oxymorphone, hydrocodone, hydromorphone, meperidine, tramadol, tapentadol, buprenorphine, fentanyl, methadone. **Benzodiazepine medications include: diazepam (Valium), alprazolam (Xanax), clonazepam (Klonopine), lorazepam (Ativan), clorazepate  (Tranxene), chlordiazepoxide (Librium), estazolam (Prosom), oxazepam (Serax), temazepam (Restoril), triazolam (Halcion) (Last updated: 03/08/2017) ____________________________________________________________________________________________    

## 2018-05-08 NOTE — Progress Notes (Signed)
Pain Management Encounter Note - Virtual Visit via Telephone Telehealth (real-time audio visits between healthcare provider and patient).  Patient's Phone No. & Preferred Pharmacy:  [phone] (home); [phone] (mobile); (Preferred) [phone]  [facility] [name], [place] - [address] [place] [address] Phone: [phone] Fax: [phone]   Pre-screening note:  Our staff contacted [name] and offered her an "in person", "face-to-face" appointment versus a telephone encounter. She indicated preferring the telephone encounter, at this time.  Reason for Virtual Visit: COVID-19*  Social distancing based on CDC and AMA recommendations.   I contacted Tiffany Herring on 05/08/2018 at 8:30 AM by telephone and clearly identified myself as Thad Ranger, NP. I verified that I was speaking with the correct person using two identifiers (Name and date of birth: 10-Jan-1961).  Advanced Informed Consent I sought verbal advanced consent from Tiffany Herring for telemedicine interactions and virtual visit. I informed [name] of the security and privacy concerns, risks, and limitations associated with performing an evaluation and management service by telephone. I also informed [name] of the availability of "in person" appointments and I informed her of the possibility of a patient responsible charge related to this service. Ms. Andring expressed understanding and agreed to proceed.   Historic Elements   [name] is a 58 y.o. year old, female patient evaluated today after her last encounter by our practice on 02/06/2018. [name]  has a past medical history of Allergy, Arthritis, Asthma, Chronic kidney disease, Depression, Diabetes mellitus without complication (HCC), Hypertension, Multiple sclerosis (HCC), Neuromuscular disorder (HCC), and Sleep apnea. She also  has a past surgical history that includes Cesarean section (1991 and 1996). [name] has a  current medication list which includes the following prescription(s): enalapril, albuterol, aspirin, b-d ins syr ultrafine 1cc/30g, fifty50 glucose meter 2.0, bupropion, vitamin d3, clotrimazole, enalapril, furosemide, gabapentin, blood glucose test strips, glucose blood, hydrocodone-acetaminophen, hydrocodone-acetaminophen, hydrocodone-acetaminophen, insulin nph-regular human, insulin regular, accu-chek multiclix, mirtazapine, montelukast, morphine, morphine, morphine, oxybutynin, tizanidine, triamcinolone cream, and vitamin b-12. She  reports that she has quit smoking. She has never used smokeless tobacco. She reports that she does not drink alcohol or use drugs. Ms. Glasford is allergic to amoxicillin-pot clavulanate; copaxone  [glatiramer acetate]; pseudoephedrine; rofecoxib; sulfa antibiotics; sulfamethoxazole-trimethoprim; and meloxicam.   HPI  I last saw her on 02/06/2018. She is being evaluated for medication management. She rates her pain a 4/10. She is having right hip and leg pain. She is also been working more. She admits that she has been cleaning. She denies any numbness or tingling. She denies any weakness. She denies any chagnes in her pain. She states that it is not any better or any worse.   Pharmacotherapy Assessment  Analgesic:Morphine ER 30 mg every 8 hours (90 mg/day) + hydrocodone/APAP 5/325 one every 6 hours (20 mg/dayof hydrocodone) MME/day:110mg /day.  Monitoring: Pharmacotherapy: No side-effects or adverse reactions reported. [place] PMP: PDMP reviewed during this encounter.       Compliance: No problems identified. Plan: Refer to "POC".  Review of recent tests  US Abdomen Complete * PRIOR REPORT IMPORTED FROM AN EXTERNAL SYSTEM *   PRIOR REPORT IMPORTED FROM THE [employer] WORKFLOW SYSTEM   REASON FOR EXAM:    Enlarged Liver Spleen  COMMENTS:   PROCEDURE:     [place]  - US ABDOMEN GENERAL SURVEY  - Jul 29 2012 10:20AM   RESULT:     The liver is enlarged. It demonstrates  increased echo texture  consistent with fatty infiltration. Portal venous flow is normal in  direction toward the liver. There is splenomegaly with maximal dimension  of  the spleen of 16.2 cm.   The gallbladder is adequately distended with no evidence of stones, wall  thickening, or pericholecystic fluid. There is no positive sonographic  Murphy's sign. The common bile duct measures 5.5 mm in diameter.   Evaluation the pancreas is limited by bowel gas. Similarly the abdominal  aorta is only partially visualized. The inferior vena cava and kidneys are  normal in appearance.   IMPRESSION:  1. There is hepatosplenomegaly.  2. The gallbladder and common bile duct and observed portions of the  pancreas appear normal.  3. The kidneys exhibit no evidence of obstruction nor other acute  abnormality.   Dictation Site: 2      Office Visit on 02/06/2018  Component Date Value Ref Range Status  . Summary 02/06/2018 FINAL   Final   Comment: ==================================================================== TOXASSURE SELECT 13 (MW) ==================================================================== Test                             Result       Flag       Units Drug Present and Declared for Prescription Verification   Morphine                       14455        EXPECTED   ng/mg creat   Normorphine                    489          EXPECTED   ng/mg creat    Potential sources of large amounts of morphine in the absence of    codeine include administration of morphine or use of heroin.    Normorphine is an expected metabolite of morphine.   Hydrocodone                    1013         EXPECTED   ng/mg creat   Hydromorphone                  111          EXPECTED   ng/mg creat   Norhydrocodone                 1245         EXPECTED   ng/mg creat    Sources of hydrocodone include scheduled prescription    medications. Hydromorphone and norhydrocodone are expected    metabolites of hydrocodone.  Hydromorphone is also                           available as a    scheduled prescription medication. ==================================================================== Test                      Result    Flag   Units      Ref Range   Creatinine              53               mg/dL      >=16 ==================================================================== Declared Medications:  The flagging and interpretation on this report are based on the  following declared medications.  Unexpected results may arise from  inaccuracies in the declared medications.  **Note: The testing scope of this panel includes these medications:  Hydrocodone (Norco)  Hydrocodone (Vicodin)  Morphine (MS Contin)  **Note: The testing scope of this panel does not include following  reported medications:  Acetaminophen (Norco)  Acetaminophen (Vicodin)  Albuterol  Aspirin (Aspirin 81)  Bupropion (Wellbutrin)  Clotrimazole (Mycelex)  Cyanocobalamin  Enalapril (Vasotec)  Furosemide (Lasix)  Gabapentin (Neurontin)  Insulin  Insulin (Humuli                          n)  Insulin (Novolin)  Mirtazapine (Remeron)  Montelukast (Singulair)  Oxybutynin (Ditropan)  Tizanidine (Zanaflex)  Triamcinolone (Kenalog)  Vitamin D3 ==================================================================== For clinical consultation, please call [phone]. ====================================================================   . Glucose 02/06/2018 199* 65 - 99 mg/dL Final  . BUN 09/81/1914 18  6 - 24 mg/dL Final  . Creatinine, Ser 02/06/2018 1.22* 0.57 - 1.00 mg/dL Final  . GFR calc non Af Amer 02/06/2018 49* >59 mL/min/1.73 Final  . GFR calc Af Amer 02/06/2018 56* >59 mL/min/1.73 Final  . BUN/Creatinine Ratio 02/06/2018 15  9 - 23 Final  . Sodium 02/06/2018 136  134 - 144 mmol/L Final  . Potassium 02/06/2018 4.9  3.5 - 5.2 mmol/L Final  . Chloride 02/06/2018 95* 96 - 106 mmol/L Final  . Calcium 02/06/2018 9.8  8.7 - 10.2  mg/dL Final  . Total Protein 02/06/2018 6.5  6.0 - 8.5 g/dL Final  . Albumin 78/29/5621 4.6  3.8 - 4.9 g/dL Final                 **Please note reference interval change**  . Globulin, Total 02/06/2018 1.9  1.5 - 4.5 g/dL Final  . Albumin/Globulin Ratio 02/06/2018 2.4* 1.2 - 2.2 Final  . Bilirubin Total 02/06/2018 0.2  0.0 - 1.2 mg/dL Final  . Alkaline Phosphatase 02/06/2018 97  39 - 117 IU/L Final  . AST 02/06/2018 25  0 - 40 IU/L Final  . Magnesium 02/06/2018 1.8  1.6 - 2.3 mg/dL Final  . Vitamin H-08 65/78/4696 1,484* 232 - 1,245 pg/mL Final  . Sed Rate 02/06/2018 28  0 - 40 mm/hr Final  . 25-Hydroxy, Vitamin D 02/06/2018 55  ng/mL Final   Comment: Reference Range: All Ages: Target levels 30 - 100   . 25-Hydroxy, Vitamin D-2 02/06/2018 2.9  ng/mL Final  . 25-Hydroxy, Vitamin D-3 02/06/2018 52  ng/mL Final  . CRP 02/06/2018 17* 0 - 10 mg/L Final   Assessment  The primary encounter diagnosis was Lumbar spondylosis. 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  Plan of Care  I am having Kamri A. Burd maintain her vitamin B-12, insulin regular, accu-chek multiclix, tiZANidine, albuterol, furosemide, mirtazapine, montelukast, oxybutynin, buPROPion, B-D INS SYR ULTRAFINE 1CC/30G, triamcinolone cream, Vitamin D3, BLOOD GLUCOSE TEST STRIPS, Fifty50 Glucose Meter 2.0, glucose blood, insulin NPH-regular Human, gabapentin, clotrimazole, aspirin, enalapril, enalapril, morphine, morphine, morphine, HYDROcodone-acetaminophen, HYDROcodone-acetaminophen, and HYDROcodone-acetaminophen.  Pharmacotherapy (Medications Ordered): Meds ordered this encounter  Medications  . morphine (MS CONTIN) 30 MG 12 hr tablet    Sig: Take 1 tablet (30 mg total) by mouth every 8 (eight) hours as needed for up to 30 days for pain.    Dispense:  90 tablet    Refill:  0    Do not place this medication, or any other  prescription  from our practice, on "Automatic Refill". Patient may have prescription filled one day early if pharmacy is closed on scheduled refill date.    Order Specific Question:   Supervising Provider    Answer:   Delano MetzNAVEIRA, FRANCISCO [phone][[id number]]  . morphine (MS CONTIN) 30 MG 12 hr tablet    Sig: Take 1 tablet (30 mg total) by mouth every 8 (eight) hours as needed for up to 30 days for pain.    Dispense:  90 tablet    Refill:  0    Do not place this medication, or any other prescription from our practice, on "Automatic Refill". Patient may have prescription filled one day early if pharmacy is closed on scheduled refill date.    Order Specific Question:   Supervising Provider    Answer:   Delano MetzNAVEIRA, FRANCISCO [phone][[id number]]  . morphine (MS CONTIN) 30 MG 12 hr tablet    Sig: Take 1 tablet (30 mg total) by mouth every 8 (eight) hours as needed for up to 30 days for pain.    Dispense:  90 tablet    Refill:  0    Do not place this medication, or any other prescription from our practice, on "Automatic Refill". Patient may have prescription filled one day early if pharmacy is closed on scheduled refill date.    Order Specific Question:   Supervising Provider    Answer:   Delano MetzNAVEIRA, FRANCISCO [phone][[id number]]  . HYDROcodone-acetaminophen (NORCO/VICODIN) 5-325 MG tablet    Sig: Take 1 tablet by mouth every 6 (six) hours as needed for up to 30 days for severe pain.    Dispense:  120 tablet    Refill:  0    Do not place this medication, or any other prescription from our practice, on "Automatic Refill". Patient may have prescription filled one day early if pharmacy is closed on scheduled refill date.    Order Specific Question:   Supervising Provider    Answer:   Delano MetzNAVEIRA, FRANCISCO [phone][[id number]]  . HYDROcodone-acetaminophen (NORCO/VICODIN) 5-325 MG tablet    Sig: Take 1 tablet by mouth every 6 (six) hours as needed for up to 30 days for severe pain.    Dispense:  120 tablet    Refill:  0    Do not place this medication, or  any other prescription from our practice, on "Automatic Refill". Patient may have prescription filled one day early if pharmacy is closed on scheduled refill date.    Order Specific Question:   Supervising Provider    Answer:   Delano MetzNAVEIRA, FRANCISCO [phone][[id number]]  . HYDROcodone-acetaminophen (NORCO/VICODIN) 5-325 MG tablet    Sig: Take 1 tablet by mouth every 6 (six) hours as needed for up to 30 days for severe pain.    Dispense:  120 tablet    Refill:  0    Do not place this medication, or any other prescription from our practice, on "Automatic Refill". Patient may have prescription filled one day early if pharmacy is closed on scheduled refill date.    Order Specific Question:   Supervising Provider    Answer:   Delano MetzNAVEIRA, FRANCISCO [phone][[id number]]   Orders:  No orders of the defined types were placed in this encounter.  Follow-up plan:   Return in about 3 months (around 08/07/2018) for MedMgmt.   I discussed the assessment and treatment plan with the patient. The patient was provided an opportunity to ask questions and all were answered. The patient agreed with the plan and demonstrated an understanding of the instructions.  Patient advised to call back or seek an in-person evaluation if the symptoms or condition worsens.  Total duration of non-face-to-face encounter: 13 minutes.  Note by: Thad Ranger, NP Date: 05/08/2018; Time: 8:40 AM  Disclaimer:  * Given the special circumstances of the COVID-19 pandemic, the federal government has announced that the Office for Civil Rights (OCR) will exercise its enforcement discretion and will not impose penalties on physicians using telehealth in the event of noncompliance with regulatory requirements under the [employer] Portability and Accountability Act (HIPAA) in connection with the good faith provision of telehealth during the COVID-19 national public health emergency. (AMA)

## 2018-07-30 ENCOUNTER — Encounter: Payer: Self-pay | Admitting: Pain Medicine

## 2018-07-30 NOTE — Progress Notes (Signed)
Pain Management Virtual Encounter Note - Virtual Visit via Telephone Telehealth (real-time audio visits between healthcare provider and patient).   Patient's Phone No. & Preferred Pharmacy:  [phone] (home); [phone] (mobile); (Preferred) [phone] [email or web address]  [facility] [place], [place] - [address] [place] [address] Phone: [phone] Fax: [phone]    Pre-screening note:  Our staff contacted [name] and offered her an "in person", "face-to-face" appointment versus a telephone encounter. She indicated preferring the telephone encounter, at this time.   Reason for Virtual Visit: COVID-19*  Social distancing based on CDC and AMA recommendations.   I contacted Tiffany Herring on 07/31/2018 via telephone.      I clearly identified myself as Oswaldo DoneFrancisco A Felishia Wartman, MD. I verified that I was speaking with the correct person using two identifiers (Name: Tiffany Herring, and date of birth: 01-12-1960).  Advanced Informed Consent I sought verbal advanced consent from Tiffany Herring for virtual visit interactions. I informed [name] of possible security and privacy concerns, risks, and limitations associated with providing "not-in-person" medical evaluation and management services. I also informed [name] of the availability of "in-person" appointments. Finally, I informed her that there would be a charge for the virtual visit and that she could be  personally, fully or partially, financially responsible for it. [name] expressed understanding and agreed to proceed.   Historic Elements   [name] is a 58 y.o. year old, female patient evaluated today after her last encounter by our practice on 05/08/2018. [name]  has a past medical history of Allergy, Arthritis, Asthma, Chronic kidney disease, Depression, Diabetes mellitus without complication (HCC), Hypertension, Multiple sclerosis (HCC), Neuromuscular disorder (HCC), and  Sleep apnea. She also  has a past surgical history that includes Cesarean section (1991 and 1996). [name] has a current medication list which includes the following prescription(s): albuterol, aspirin, b-d ins syr ultrafine 1cc/30g, fifty50 glucose meter 2.0, bupropion, vitamin d3, clotrimazole, enalapril, furosemide, gabapentin, blood glucose test strips, glucose blood, hydrocodone-acetaminophen, hydrocodone-acetaminophen, hydrocodone-acetaminophen, insulin nph-regular human, insulin regular, accu-chek multiclix, mirtazapine, montelukast, morphine, morphine, morphine, oxybutynin, tizanidine, triamcinolone cream, and vitamin b-12. She  reports that she has quit smoking. She has never used smokeless tobacco. She reports that she does not drink alcohol or use drugs. [name] is allergic to amoxicillin-pot clavulanate; copaxone  [glatiramer acetate]; pseudoephedrine; rofecoxib; sulfa antibiotics; sulfamethoxazole-trimethoprim; and meloxicam.   HPI  Today, she is being contacted for medication management.  Pharmacotherapy Assessment  Analgesic: Morphine ER 30 mg, 1 tab PO q 8 hrs (90 mg/day) + hydrocodone/APAP 5/325, 1 tab PO q 6 hrs (20 mg/dayof hydrocodone) MME/day:110mg /day.   Monitoring: Pharmacotherapy: No side-effects or adverse reactions reported. [place] PMP: PDMP reviewed during this encounter.       Compliance: No problems identified. Effectiveness: Clinically acceptable. Plan: Refer to "POC".  Pertinent Labs   SAFETY SCREENING Profile No results found for: SARSCOV2NAA, COVIDSOURCE, STAPHAUREUS, MRSAPCR, HCVAB, HIV, PREGTESTUR Renal Function Lab Results  Component Value Date   BUN 18 02/06/2018   CREATININE 1.22 (H) 02/06/2018   BCR 15 02/06/2018   GFRAA 56 (L) 02/06/2018   GFRNONAA 49 (L) 02/06/2018   Hepatic Function Lab Results  Component Value Date   AST 25 02/06/2018   ALT 19 02/15/2015   ALBUMIN 4.6 02/06/2018   UDS Summary  Date Value Ref Range Status   02/06/2018 FINAL  Final    Comment:    ==================================================================== TOXASSURE SELECT 13 (MW) ====================================================================  Test                             Result       Flag       Units Drug Present and Declared for Prescription Verification   Morphine                       14455        EXPECTED   ng/mg creat   Normorphine                    489          EXPECTED   ng/mg creat    Potential sources of large amounts of morphine in the absence of    codeine include administration of morphine or use of heroin.    Normorphine is an expected metabolite of morphine.   Hydrocodone                    1013         EXPECTED   ng/mg creat   Hydromorphone                  111          EXPECTED   ng/mg creat   Norhydrocodone                 1245         EXPECTED   ng/mg creat    Sources of hydrocodone include scheduled prescription    medications. Hydromorphone and norhydrocodone are expected    metabolites of hydrocodone. Hydromorphone is also available as a    scheduled prescription medication. ==================================================================== Test                      Result    Flag   Units      Ref Range   Creatinine              53               mg/dL      >=08>=20 ==================================================================== Declared Medications:  The flagging and interpretation on this report are based on the  following declared medications.  Unexpected results may arise from  inaccuracies in the declared medications.  **Note: The testing scope of this panel includes these medications:  Hydrocodone (Norco)  Hydrocodone (Vicodin)  Morphine (MS Contin)  **Note: The testing scope of this panel does not include following  reported medications:  Acetaminophen (Norco)  Acetaminophen (Vicodin)  Albuterol  Aspirin (Aspirin 81)  Bupropion (Wellbutrin)  Clotrimazole (Mycelex)  Cyanocobalamin   Enalapril (Vasotec)  Furosemide (Lasix)  Gabapentin (Neurontin)  Insulin  Insulin (Humulin)  Insulin (Novolin)  Mirtazapine (Remeron)  Montelukast (Singulair)  Oxybutynin (Ditropan)  Tizanidine (Zanaflex)  Triamcinolone (Kenalog)  Vitamin D3 ==================================================================== For clinical consultation, please call [phone]. ====================================================================    Note: Above Lab results reviewed.  Recent imaging  US Abdomen Complete * PRIOR REPORT IMPORTED FROM AN EXTERNAL SYSTEM *   PRIOR REPORT IMPORTED FROM THE [facility] WORKFLOW SYSTEM   REASON FOR EXAM:    Enlarged Liver Spleen  COMMENTS:   PROCEDURE:     US  - US ABDOMEN GENERAL SURVEY  - Jul 29 2012 10:20AM   RESULT:     The liver is enlarged. It demonstrates increased echo texture  consistent with fatty infiltration. Portal venous  flow is normal in  direction toward the liver. There is splenomegaly with maximal dimension  of  the spleen of 16.2 cm.   The gallbladder is adequately distended with no evidence of stones, wall  thickening, or pericholecystic fluid. There is no positive sonographic  Murphy's sign. The common bile duct measures 5.5 mm in diameter.   Evaluation the pancreas is limited by bowel gas. Similarly the abdominal  aorta is only partially visualized. The inferior vena cava and kidneys are  normal in appearance.   IMPRESSION:  1. There is hepatosplenomegaly.  2. The gallbladder and common bile duct and observed portions of the  pancreas appear normal.  3. The kidneys exhibit no evidence of obstruction nor other acute  abnormality.   Dictation Site: 2     Assessment  The primary encounter diagnosis was Chronic pain syndrome. Diagnoses of Failed back surgical syndrome and Multiple sclerosis ([place]) were also pertinent to this visit.  Plan of Care  I have discontinued Tierney A. Bober's morphine, morphine,  HYDROcodone-acetaminophen, and HYDROcodone-acetaminophen. I have also changed her HYDROcodone-acetaminophen and morphine. Additionally, I am having her start on morphine, morphine, HYDROcodone-acetaminophen, and HYDROcodone-acetaminophen. Lastly, I am having her maintain her vitamin B-12, insulin regular, accu-chek multiclix, tiZANidine, albuterol, furosemide, mirtazapine, montelukast, oxybutynin, buPROPion, B-D INS SYR ULTRAFINE 1CC/30G, triamcinolone cream, Vitamin D3, BLOOD GLUCOSE TEST STRIPS, Fifty50 Glucose Meter 2.0, glucose blood, insulin NPH-regular Human, gabapentin, clotrimazole, aspirin, and enalapril.  Pharmacotherapy (Medications Ordered): Meds ordered this encounter  Medications  . HYDROcodone-acetaminophen (NORCO/VICODIN) 5-325 MG tablet    Sig: Take 1 tablet by mouth every 6 (six) hours as needed for severe pain. Must last 30 days    Dispense:  120 tablet    Refill:  0    Chronic Pain: STOP Act (Not applicable) Fill 1 day early if closed on refill date. Do not fill until: 08/16/2018. To last until: 09/15/2018. Avoid benzodiazepines within 8 hours of opioids  . morphine (MS CONTIN) 30 MG 12 hr tablet    Sig: Take 1 tablet (30 mg total) by mouth every 8 (eight) hours as needed for pain. Must last 30 days    Dispense:  90 tablet    Refill:  0    Chronic Pain: STOP Act (Not applicable) Fill 1 day early if closed on refill date. Do not fill until: 08/16/2018. To last until: 09/15/2018. Avoid benzodiazepines within 8 hours of opioids  . morphine (MS CONTIN) 30 MG 12 hr tablet    Sig: Take 1 tablet (30 mg total) by mouth every 8 (eight) hours as needed for pain. Must last 30 days    Dispense:  90 tablet    Refill:  0    Chronic Pain: STOP Act (Not applicable) Fill 1 day early if closed on refill date. Do not fill until: 09/15/2018. To last until: 10/15/2018. Avoid benzodiazepines within 8 hours of opioids  . morphine (MS CONTIN) 30 MG 12 hr tablet    Sig: Take 1 tablet (30 mg total) by mouth  every 8 (eight) hours as needed for pain. Must last 30 days    Dispense:  90 tablet    Refill:  0    Chronic Pain: STOP Act (Not applicable) Fill 1 day early if closed on refill date. Do not fill until: 10/15/2018. To last until: 11/14/2018. Avoid benzodiazepines within 8 hours of opioids  . HYDROcodone-acetaminophen (NORCO/VICODIN) 5-325 MG tablet    Sig: Take 1 tablet by mouth every 6 (six) hours as needed for severe  pain. Must last 30 days    Dispense:  120 tablet    Refill:  0    Chronic Pain: STOP Act (Not applicable) Fill 1 day early if closed on refill date. Do not fill until: 09/15/2018. To last until: 10/15/2018. Avoid benzodiazepines within 8 hours of opioids  . HYDROcodone-acetaminophen (NORCO/VICODIN) 5-325 MG tablet    Sig: Take 1 tablet by mouth every 6 (six) hours as needed for severe pain. Must last 30 days    Dispense:  120 tablet    Refill:  0    Chronic Pain: STOP Act (Not applicable) Fill 1 day early if closed on refill date. Do not fill until: 10/15/2018. To last until: 11/14/2018. Avoid benzodiazepines within 8 hours of opioids   Orders:  No orders of the defined types were placed in this encounter.  Follow-up plan:   Return in about 15 weeks (around 11/13/2018) for (VV), E/M (MM).      Interventional therapies: Planned, scheduled, and/or pending:   Not at this time.   Considering:   None at this time.    Palliative PRN treatment(s):   None at this time.     Recent Visits Date Type Provider Dept  05/08/18 Office Visit Barbette MerinoKing, Crystal M, NP [facility]  Showing recent visits within past 90 days and meeting all other requirements   Today's Visits Date Type Provider Dept  07/31/18 Office Visit Delano MetzNaveira, Rashunda Passon, MD [facility]  Showing today's visits and meeting all other requirements   Future Appointments No visits were found meeting these conditions.  Showing future appointments within next 90 days and meeting all other requirements   I  discussed the assessment and treatment plan with the patient. The patient was provided an opportunity to ask questions and all were answered. The patient agreed with the plan and demonstrated an understanding of the instructions.  Patient advised to call back or seek an in-person evaluation if the symptoms or condition worsens.  Total duration of non-face-to-face encounter: 12 minutes.  Note by: Oswaldo DoneFrancisco A Inella Kuwahara, MD Date: 07/31/2018; Time: 4:25 PM  Note: This dictation was prepared with Dragon dictation. Any transcriptional errors that may result from this process are unintentional.  Disclaimer:  * Given the special circumstances of the COVID-19 pandemic, the federal government has announced that the Office for Civil Rights (OCR) will exercise its enforcement discretion and will not impose penalties on physicians using telehealth in the event of noncompliance with regulatory requirements under the [employer] Insurance Portability and Accountability Act (HIPAA) in connection with the good faith provision of telehealth during the COVID-19 national public health emergency. (AMA)

## 2018-07-31 ENCOUNTER — Ambulatory Visit: Payer: Medicare HMO | Attending: Pain Medicine | Admitting: Pain Medicine

## 2018-07-31 ENCOUNTER — Other Ambulatory Visit: Payer: Self-pay

## 2018-07-31 DIAGNOSIS — G894 Chronic pain syndrome: Secondary | ICD-10-CM | POA: Diagnosis not present

## 2018-07-31 DIAGNOSIS — M961 Postlaminectomy syndrome, not elsewhere classified: Secondary | ICD-10-CM | POA: Diagnosis not present

## 2018-07-31 DIAGNOSIS — G35 Multiple sclerosis: Secondary | ICD-10-CM

## 2018-07-31 MED ORDER — MORPHINE SULFATE ER 30 MG PO TBCR: 30.0000 mg | EXTENDED_RELEASE_TABLET | Freq: Three times a day (TID) | ORAL | 0 refills | Status: DC | PRN

## 2018-07-31 MED ORDER — HYDROCODONE-ACETAMINOPHEN 5-325 MG PO TABS: 1.0000 | ORAL_TABLET | Freq: Four times a day (QID) | ORAL | 0 refills | Status: DC | PRN

## 2018-07-31 NOTE — Patient Instructions (Signed)
____________________________________________________________________________________________  Drug Holidays (Slow)  What is a "Drug Holiday"? Drug Holiday: is the name given to the period of time during which a patient stops taking a medication(s) for the purpose of eliminating tolerance to the drug.  Benefits . Improved effectiveness of opioids. . Decreased opioid dose needed to achieve benefits. . Improved pain with lesser dose.  What is tolerance? Tolerance: is the progressive decreased in effectiveness of a drug due to its repetitive use. With repetitive use, the body gets use to the medication and as a consequence, it loses its effectiveness. This is a common problem seen with opioid pain medications. As a result, a larger dose of the drug is needed to achieve the same effect that used to be obtained with a smaller dose.  How long should a "Drug Holiday" last? You should stay off of the pain medicine for at least 14 consecutive days. (2 weeks)  Should I stop the medicine "cold turkey"? No. You should always coordinate with your Pain Specialist so that he/she can provide you with the correct medication dose to make the transition as smoothly as possible.  How do I stop the medicine? Slowly. You will be instructed to decrease the daily amount of pills that you take by one (1) pill every seven (7) days. This is called a "slow downward taper" of your dose. For example: if you normally take four (4) pills per day, you will be asked to drop this dose to three (3) pills per day for seven (7) days, then to two (2) pills per day for seven (7) days, then to one (1) per day for seven (7) days, and at the end of those last seven (7) days, this is when the "Drug Holiday" would start.   Will I have withdrawals? By doing a "slow downward taper" like this one, it is unlikely that you will experience any significant withdrawal symptoms. Typically, what triggers withdrawals is the sudden stop of a high  dose opioid therapy. Withdrawals can usually be avoided by slowly decreasing the dose over a prolonged period of time.  What are withdrawals? Withdrawals: refers to the wide range of symptoms that occur after stopping or dramatically reducing opiate drugs after heavy and prolonged use. Withdrawal symptoms do not occur to patients that use low dose opioids, or those who take the medication sporadically. Contrary to benzodiazepine (example: Valium, Xanax, etc.) or alcohol withdrawals ("Delirium Tremens"), opioid withdrawals are not lethal. Withdrawals are the physical manifestation of the body getting rid of the excess receptors.  Expected Symptoms Early symptoms of withdrawal may include: . Agitation . Anxiety . Muscle aches . Increased tearing . Insomnia . Runny nose . Sweating . Yawning  Late symptoms of withdrawal may include: . Abdominal cramping . Diarrhea . Dilated pupils . Goose bumps . Nausea . Vomiting  Will I experience withdrawals? Due to the slow nature of the taper, it is very unlikely that you will experience any.  What is a slow taper? Taper: refers to the gradual decrease in dose.  ___________________________________________________________________________________________     

## 2018-08-05 ENCOUNTER — Encounter: Payer: Medicare HMO | Admitting: Pain Medicine

## 2018-09-06 DIAGNOSIS — E1122 Type 2 diabetes mellitus with diabetic chronic kidney disease: Secondary | ICD-10-CM | POA: Diagnosis not present

## 2018-09-06 DIAGNOSIS — N183 Chronic kidney disease, stage 3 (moderate): Secondary | ICD-10-CM | POA: Diagnosis not present

## 2018-09-06 DIAGNOSIS — Z794 Long term (current) use of insulin: Secondary | ICD-10-CM | POA: Diagnosis not present

## 2018-09-06 DIAGNOSIS — I10 Essential (primary) hypertension: Secondary | ICD-10-CM | POA: Diagnosis not present

## 2018-09-06 DIAGNOSIS — L0291 Cutaneous abscess, unspecified: Secondary | ICD-10-CM | POA: Diagnosis not present

## 2018-09-17 DIAGNOSIS — L0291 Cutaneous abscess, unspecified: Secondary | ICD-10-CM | POA: Diagnosis not present

## 2018-09-17 DIAGNOSIS — N189 Chronic kidney disease, unspecified: Secondary | ICD-10-CM | POA: Diagnosis not present

## 2018-09-17 DIAGNOSIS — E1122 Type 2 diabetes mellitus with diabetic chronic kidney disease: Secondary | ICD-10-CM | POA: Diagnosis not present

## 2018-09-17 DIAGNOSIS — Z87891 Personal history of nicotine dependence: Secondary | ICD-10-CM | POA: Diagnosis not present

## 2018-11-05 ENCOUNTER — Encounter: Payer: Self-pay | Admitting: Pain Medicine

## 2018-11-06 ENCOUNTER — Other Ambulatory Visit: Payer: Self-pay

## 2018-11-06 ENCOUNTER — Ambulatory Visit: Payer: Medicare HMO | Attending: Pain Medicine | Admitting: Pain Medicine

## 2018-11-06 DIAGNOSIS — G35 Multiple sclerosis: Secondary | ICD-10-CM | POA: Diagnosis not present

## 2018-11-06 DIAGNOSIS — M961 Postlaminectomy syndrome, not elsewhere classified: Secondary | ICD-10-CM | POA: Diagnosis not present

## 2018-11-06 DIAGNOSIS — G8929 Other chronic pain: Secondary | ICD-10-CM

## 2018-11-06 DIAGNOSIS — M47816 Spondylosis without myelopathy or radiculopathy, lumbar region: Secondary | ICD-10-CM

## 2018-11-06 DIAGNOSIS — M5441 Lumbago with sciatica, right side: Secondary | ICD-10-CM

## 2018-11-06 DIAGNOSIS — M79604 Pain in right leg: Secondary | ICD-10-CM

## 2018-11-06 DIAGNOSIS — G894 Chronic pain syndrome: Secondary | ICD-10-CM

## 2018-11-06 DIAGNOSIS — M4310 Spondylolisthesis, site unspecified: Secondary | ICD-10-CM

## 2018-11-06 MED ORDER — HYDROCODONE-ACETAMINOPHEN 5-325 MG PO TABS: 1.0000 | ORAL_TABLET | Freq: Four times a day (QID) | ORAL | 0 refills | Status: DC | PRN

## 2018-11-06 MED ORDER — MORPHINE SULFATE ER 30 MG PO TBCR: 30.0000 mg | EXTENDED_RELEASE_TABLET | Freq: Three times a day (TID) | ORAL | 0 refills | Status: DC | PRN

## 2018-11-06 NOTE — Progress Notes (Signed)
Pain Management Virtual Encounter Note - Virtual Visit via Telephone Telehealth (real-time audio visits between healthcare provider and patient).   Patient's Phone No. & Preferred Pharmacy:  [phone] (home); [phone] (mobile); (Preferred) [phone] [email or web address]  [place], [place] [address] Phone: [phone] Fax: [phone]    Pre-screening note:  Our staff contacted Tiffany Herring and offered her an "in person", "face-to-face" appointment versus a telephone encounter. She indicated preferring the telephone encounter, at this time.   Reason for Virtual Visit: COVID-19*  Social distancing based on CDC and AMA recommendations.   I contacted Tiffany Herring on 11/06/2018 via telephone.      I clearly identified myself as Gaspar Cola, MD. I verified that I was speaking with the correct person using two identifiers (Name: Tiffany Herring, and date of birth: 1960/08/19).  Advanced Informed Consent I sought verbal advanced consent from Tiffany Herring for virtual visit interactions. I informed Tiffany Herring of possible security and privacy concerns, risks, and limitations associated with providing "not-in-person" medical evaluation and management services. I also informed Tiffany Herring of the availability of "in-person" appointments. Finally, I informed her that there would be a charge for the virtual visit and that she could be  personally, fully or partially, financially responsible for it. Tiffany Herring expressed understanding and agreed to proceed.   Historic Elements   Tiffany Herring is a 58 y.o. year old, female patient evaluated today after her last encounter by our practice on 07/31/2018. Ms. Beining  has a past medical history of Allergy, Arthritis, Asthma, Chronic kidney disease, Depression, Diabetes mellitus without complication ([place]), Hypertension, Multiple sclerosis ([place]), Neuromuscular disorder ([place]), and  Sleep apnea. She also  has a past surgical history that includes Cesarean section (1991 and 1996). Ms. Morford has a current medication list which includes the following prescription(s): albuterol, aspirin, b-d ins syr ultrafine 1cc/30g, fifty50 glucose meter 2.0, bupropion, vitamin d3, clotrimazole, enalapril, furosemide, gabapentin, blood glucose test strips, glucose blood, hydrocodone-acetaminophen, hydrocodone-acetaminophen, hydrocodone-acetaminophen, insulin nph-regular human, insulin regular, accu-chek multiclix, mirtazapine, montelukast, morphine, morphine, morphine, oxybutynin, tizanidine, triamcinolone cream, and vitamin b-12. She  reports that she has quit smoking. She has never used smokeless tobacco. She reports that she does not drink alcohol or use drugs. Ms. Horseman is allergic to amoxicillin-pot clavulanate; copaxone  [glatiramer acetate]; pseudoephedrine; rofecoxib; sulfa antibiotics; sulfamethoxazole-trimethoprim; and meloxicam.   HPI  Today, she is being contacted for medication management.  The patient indicates doing well with the current medication regimen. No adverse reactions or side effects reported to the medications.   Pharmacotherapy Assessment  Analgesic: Morphine ER 30 mg, 1 tab PO q 8 hrs (90 mg/day) + hydrocodone/APAP 5/325, 1 tab PO q 6 hrs (20 mg/dayof hydrocodone) MME/day:110mg /day.   Monitoring: Pharmacotherapy: No side-effects or adverse reactions reported. [place] PMP: PDMP reviewed during this encounter.       Compliance: No problems identified. Effectiveness: Clinically acceptable. Plan: Refer to "POC".  UDS:  Summary  Date Value Ref Range Status  02/06/2018 FINAL  Final    Comment:    ==================================================================== TOXASSURE SELECT 13 (MW) ==================================================================== Test                             Result       Flag       Units Drug Present and Declared for Prescription  Verification   Morphine  14455        EXPECTED   ng/mg creat   Normorphine                    489          EXPECTED   ng/mg creat    Potential sources of large amounts of morphine in the absence of    codeine include administration of morphine or use of heroin.    Normorphine is an expected metabolite of morphine.   Hydrocodone                    1013         EXPECTED   ng/mg creat   Hydromorphone                  111          EXPECTED   ng/mg creat   Norhydrocodone                 1245         EXPECTED   ng/mg creat    Sources of hydrocodone include scheduled prescription    medications. Hydromorphone and norhydrocodone are expected    metabolites of hydrocodone. Hydromorphone is also available as a    scheduled prescription medication. ==================================================================== Test                      Result    Flag   Units      Ref Range   Creatinine              53               mg/dL      >=70 ==================================================================== Declared Medications:  The flagging and interpretation on this report are based on the  following declared medications.  Unexpected results may arise from  inaccuracies in the declared medications.  **Note: The testing scope of this panel includes these medications:  Hydrocodone (Norco)  Hydrocodone (Vicodin)  Morphine (MS Contin)  **Note: The testing scope of this panel does not include following  reported medications:  Acetaminophen (Norco)  Acetaminophen (Vicodin)  Albuterol  Aspirin (Aspirin 81)  Bupropion (Wellbutrin)  Clotrimazole (Mycelex)  Cyanocobalamin  Enalapril (Vasotec)  Furosemide (Lasix)  Gabapentin (Neurontin)  Insulin  Insulin (Humulin)  Insulin (Novolin)  Mirtazapine (Remeron)  Montelukast (Singulair)  Oxybutynin (Ditropan)  Tizanidine (Zanaflex)  Triamcinolone (Kenalog)  Vitamin  D3 ==================================================================== For clinical consultation, please call [phone]. ====================================================================    Laboratory Chemistry Profile (12 mo)  Renal: 02/06/2018: BUN 18; BUN/Creatinine Ratio 15; Creatinine, Ser 1.22  Lab Results  Component Value Date   GFRAA 56 (L) 02/06/2018   GFRNONAA 49 (L) 02/06/2018   Hepatic: 02/06/2018: Albumin 4.6 Lab Results  Component Value Date   AST 25 02/06/2018   ALT 19 02/15/2015   Other: 02/06/2018: 25-Hydroxy, Vitamin D 55; 25-Hydroxy, Vitamin D-2 2.9; 25-Hydroxy, Vitamin D-3 52; CRP 17; Sed Rate 28; Vitamin B-12 1,484 Note: Above Lab results reviewed.  Imaging  Last 90 days:  No results found.  Assessment  The primary encounter diagnosis was Chronic pain syndrome. 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  Plan of Care  I am having Tiffany Herring start on HYDROcodone-acetaminophen, HYDROcodone-acetaminophen, morphine, and morphine.  I am also having her maintain her vitamin B-12, insulin regular, accu-chek multiclix, tiZANidine, albuterol, furosemide, mirtazapine, montelukast, oxybutynin, buPROPion, B-D INS SYR ULTRAFINE 1CC/30G, triamcinolone cream, Vitamin D3, BLOOD GLUCOSE TEST STRIPS, Fifty50 Glucose Meter 2.0, glucose blood, insulin NPH-regular Human, gabapentin, clotrimazole, aspirin, enalapril, morphine, and HYDROcodone-acetaminophen.  Pharmacotherapy (Medications Ordered): Meds ordered this encounter  Medications  . morphine (MS CONTIN) 30 MG 12 hr tablet    Sig: Take 1 tablet (30 mg total) by mouth every 8 (eight) hours as needed for pain. Must last 30 days    Dispense:  90 tablet    Refill:  0    Chronic Pain: STOP Act  (Not applicable) Fill 1 day early if closed on refill date. Do not fill until: 11/14/2018. To last until: 12/14/2018. Avoid benzodiazepines within 8 hours of opioids  . HYDROcodone-acetaminophen (NORCO/VICODIN) 5-325 MG tablet    Sig: Take 1 tablet by mouth every 6 (six) hours as needed for severe pain. Must last 30 days    Dispense:  120 tablet    Refill:  0    Chronic Pain: STOP Act (Not applicable) Fill 1 day early if closed on refill date. Do not fill until: 11/14/2018. To last until: 12/14/2018. Avoid benzodiazepines within 8 hours of opioids  . HYDROcodone-acetaminophen (NORCO/VICODIN) 5-325 MG tablet    Sig: Take 1 tablet by mouth every 6 (six) hours as needed for severe pain. Must last 30 days    Dispense:  120 tablet    Refill:  0    Chronic Pain: STOP Act (Not applicable) Fill 1 day early if closed on refill date. Do not fill until: 12/14/2018. To last until: 01/13/2019. Avoid benzodiazepines within 8 hours of opioids  . HYDROcodone-acetaminophen (NORCO/VICODIN) 5-325 MG tablet    Sig: Take 1 tablet by mouth every 6 (six) hours as needed for severe pain. Must last 30 days    Dispense:  120 tablet    Refill:  0    Chronic Pain: STOP Act (Not applicable) Fill 1 day early if closed on refill date. Do not fill until: 01/13/2019. To last until: 02/12/2019. Avoid benzodiazepines within 8 hours of opioids  . morphine (MS CONTIN) 30 MG 12 hr tablet    Sig: Take 1 tablet (30 mg total) by mouth every 8 (eight) hours as needed for pain. Must last 30 days    Dispense:  90 tablet    Refill:  0    Chronic Pain: STOP Act (Not applicable) Fill 1 day early if closed on refill date. Do not fill until: 12/14/2018. To last until: 01/13/2019. Avoid benzodiazepines within 8 hours of opioids  . morphine (MS CONTIN) 30 MG 12 hr tablet    Sig: Take 1 tablet (30 mg total) by mouth every 8 (eight) hours as needed for pain. Must last 30 days    Dispense:  90 tablet    Refill:  0    Chronic Pain: STOP Act (Not applicable)  Fill 1 day early if closed on refill date. Do not fill until: 01/13/2019. To last until: 02/12/2019. Avoid benzodiazepines within 8 hours of opioids   Orders:  No orders of the defined types were placed in this encounter.  Follow-up plan:   Return in about 14 weeks (around 02/12/2019) for (VV), (MM).      Interventional therapies: Planned, scheduled, and/or pending:   Not at this time.   Considering:   None at this time.    Palliative PRN treatment(s):   None at this time.  Recent Visits No visits were found meeting these conditions.  Showing recent visits within past 90 days and meeting all other requirements   Today's Visits Date Type Provider Dept  11/06/18 Office Visit Delano MetzNaveira, Kipling Graser, MD [facility]  Showing today's visits and meeting all other requirements   Future Appointments No visits were found meeting these conditions.  Showing future appointments within next 90 days and meeting all other requirements   I discussed the assessment and treatment plan with the patient. The patient was provided an opportunity to ask questions and all were answered. The patient agreed with the plan and demonstrated an understanding of the instructions.  Patient advised to call back or seek an in-person evaluation if the symptoms or condition worsens.  Total duration of non-face-to-face encounter: 12 minutes.  Note by: Tiffany DoneFrancisco A Ki Corbo, MD Date: 11/06/2018; Time: 3:36 PM  Note: This dictation was prepared with Dragon dictation. Any transcriptional errors that may result from this process are unintentional.  Disclaimer:  * Given the special circumstances of the COVID-19 pandemic, the federal government has announced that the Office for Civil Rights (OCR) will exercise its enforcement discretion and will not impose penalties on physicians using telehealth in the event of noncompliance with regulatory requirements under the [employer] Insurance Portability and Accountability Act  (HIPAA) in connection with the good faith provision of telehealth during the COVID-19 national public health emergency. (AMA)

## 2019-02-11 ENCOUNTER — Encounter: Payer: Self-pay | Admitting: Pain Medicine

## 2019-02-12 ENCOUNTER — Other Ambulatory Visit: Payer: Self-pay

## 2019-02-12 ENCOUNTER — Ambulatory Visit: Payer: Medicare HMO | Attending: Pain Medicine | Admitting: Pain Medicine

## 2019-02-12 DIAGNOSIS — G894 Chronic pain syndrome: Secondary | ICD-10-CM | POA: Diagnosis not present

## 2019-02-12 DIAGNOSIS — G35 Multiple sclerosis: Secondary | ICD-10-CM | POA: Diagnosis not present

## 2019-02-12 DIAGNOSIS — M961 Postlaminectomy syndrome, not elsewhere classified: Secondary | ICD-10-CM

## 2019-02-12 MED ORDER — MORPHINE SULFATE ER 30 MG PO TBCR: 30.0000 mg | EXTENDED_RELEASE_TABLET | Freq: Three times a day (TID) | ORAL | 0 refills | Status: DC | PRN

## 2019-02-12 MED ORDER — HYDROCODONE-ACETAMINOPHEN 5-325 MG PO TABS: 1.0000 | ORAL_TABLET | Freq: Four times a day (QID) | ORAL | 0 refills | Status: DC | PRN

## 2019-02-12 NOTE — Progress Notes (Signed)
Patient: Tiffany Herring  Service Category: E/M  Provider: Oswaldo Done, MD  DOB: 12-04-1960  DOS: 02/12/2019  Location: Office  MRN: [id number]  Setting: Ambulatory outpatient  Referring Provider: Rolm Gala, MD  Type: Established Patient  Specialty: Interventional Pain Management  PCP: Rolm Gala, MD  Location: Remote location  Delivery: TeleHealth     Virtual Encounter - Pain Management PROVIDER NOTE: Information contained herein reflects review and annotations entered in association with encounter. Interpretation of such information and data should be left to medically-trained personnel. Information provided to patient can be located elsewhere in the medical record under "Patient Instructions". Document created using STT-dictation technology, any transcriptional errors that may result from process are unintentional.    Contact & Pharmacy Preferred: [phone] Home: [phone] (home) Mobile: [phone] (mobile) E-mail: [email or web address]  [name], [place] - [address] [place] [address] Phone: [phone] Fax: [phone]   Pre-screening  Tiffany Herring offered "in-person" vs "virtual" encounter. She indicated preferring virtual for this encounter.   Reason COVID-19*  Social distancing based on CDC and AMA recommendations.   I contacted Tiffany Herring on 02/12/2019 via telephone.      I clearly identified myself as Oswaldo Done, MD. I verified that I was speaking with the correct person using two identifiers (Name: Tiffany Herring, and date of birth: 26-Apr-1960).  Consent I sought verbal advanced consent from Tiffany Herring for virtual visit interactions. I informed Tiffany Herring of possible security and privacy concerns, risks, and limitations associated with providing "not-in-person" medical evaluation and management services. I also informed Tiffany Herring of the availability of "in-person" appointments. Finally, I informed her that  there would be a charge for the virtual visit and that she could be  personally, fully or partially, financially responsible for it. Tiffany Herring expressed understanding and agreed to proceed.   Historic Elements   Tiffany Herring is a 59 y.o. year old, female patient evaluated today after her last encounter by our practice on 11/06/2018. Tiffany Herring  has a past medical history of Allergy, Arthritis, Asthma, Chronic kidney disease, Depression, Diabetes mellitus without complication (HCC), Hypertension, Multiple sclerosis (HCC), Neuromuscular disorder (HCC), and Sleep apnea. She also  has a past surgical history that includes Cesarean section (1991 and 1996). Tiffany Herring has a current medication list which includes the following prescription(s): albuterol, aspirin, b-d ins syr ultrafine 1cc/30g, fifty50 glucose meter 2.0, bupropion, clotrimazole, enalapril, furosemide, gabapentin, blood glucose test strips, glucose blood, hydrocodone-acetaminophen, [START ON 03/14/2019] hydrocodone-acetaminophen, [START ON 04/13/2019] hydrocodone-acetaminophen, insulin nph-regular human, insulin regular, accu-chek multiclix, mirtazapine, montelukast, morphine, [START ON 03/14/2019] morphine, [START ON 04/13/2019] morphine, oxybutynin, tizanidine, triamcinolone cream, and vitamin b-12. She  reports that she has quit smoking. She has never used smokeless tobacco. She reports that she does not drink alcohol or use drugs. Tiffany Herring is allergic to amoxicillin-pot clavulanate; copaxone  [glatiramer acetate]; pseudoephedrine; rofecoxib; sulfa antibiotics; sulfamethoxazole-trimethoprim; and meloxicam.   HPI  Today, she is being contacted for medication management.  Pharmacotherapy Assessment  Analgesic: Morphine ER 30 mg, 1 tab PO q 8 hrs (90 mg/day) + hydrocodone/APAP 5/325, 1 tab PO q 6 hrs (20 mg/dayof hydrocodone) MME/day:110mg /day.   Monitoring: [place] PMP: PDMP reviewed during this encounter.       Pharmacotherapy: No  side-effects or adverse reactions reported. Compliance: No problems identified. Effectiveness: Clinically acceptable. Plan: Refer to "POC".  UDS:  Summary  Date Value Ref Range Status  02/06/2018 FINAL  Final    Comment:    ==================================================================== TOXASSURE SELECT 13 (MW) ==================================================================== Test                             Result       Flag       Units Drug Present and Declared for Prescription Verification   Morphine                       14455        EXPECTED   ng/mg creat   Normorphine                    489          EXPECTED   ng/mg creat    Potential sources of large amounts of morphine in the absence of    codeine include administration of morphine or use of heroin.    Normorphine is an expected metabolite of morphine.   Hydrocodone                    1013         EXPECTED   ng/mg creat   Hydromorphone                  111          EXPECTED   ng/mg creat   Norhydrocodone                 1245         EXPECTED   ng/mg creat    Sources of hydrocodone include scheduled prescription    medications. Hydromorphone and norhydrocodone are expected    metabolites of hydrocodone. Hydromorphone is also available as a    scheduled prescription medication. ==================================================================== Test                      Result    Flag   Units      Ref Range   Creatinine              53               mg/dL      >=20 ==================================================================== Declared Medications:  The flagging and interpretation on this report are based on the  following declared medications.  Unexpected results may arise from  inaccuracies in the declared medications.  **Note: The testing scope of this panel includes these medications:  Hydrocodone (Norco)  Hydrocodone (Vicodin)  Morphine (MS Contin)  **Note: The testing scope of this panel does not  include following  reported medications:  Acetaminophen (Norco)  Acetaminophen (Vicodin)  Albuterol  Aspirin (Aspirin 81)  Bupropion (Wellbutrin)  Clotrimazole (Mycelex)  Cyanocobalamin  Enalapril (Vasotec)  Furosemide (Lasix)  Gabapentin (Neurontin)  Insulin  Insulin (Humulin)  Insulin (Novolin)  Mirtazapine (Remeron)  Montelukast (Singulair)  Oxybutynin (Ditropan)  Tizanidine (Zanaflex)  Triamcinolone (Kenalog)  Vitamin D3 ==================================================================== For clinical consultation, please call [phone]. ====================================================================    Laboratory Chemistry Profile (12 mo)  Renal: No results found for requested labs within last 8760 hours.  Lab Results  Component Value Date   GFRAA 56 (L) 02/06/2018   GFRNONAA 49 (L) 02/06/2018   Hepatic: No results found for requested labs within last 8760 hours. Lab Results  Component Value Date   AST 25 02/06/2018   ALT 19 02/15/2015   Other: No results found for requested  labs within last 8760 hours.  Note: Above Lab results reviewed.  Imaging  US Abdomen Complete * PRIOR REPORT IMPORTED FROM AN EXTERNAL SYSTEM *   PRIOR REPORT IMPORTED FROM THE [employer] WORKFLOW SYSTEM   REASON FOR EXAM:    Enlarged Liver Spleen  COMMENTS:   PROCEDURE:     [place]  - US ABDOMEN GENERAL SURVEY  - Jul 29 2012 10:20AM   RESULT:     The liver is enlarged. It demonstrates increased echo texture  consistent with fatty infiltration. Portal venous flow is normal in  direction toward the liver. There is splenomegaly with maximal dimension  of  the spleen of 16.2 cm.   The gallbladder is adequately distended with no evidence of stones, wall  thickening, or pericholecystic fluid. There is no positive sonographic  Murphy's sign. The common bile duct measures 5.5 mm in diameter.   Evaluation the pancreas is limited by bowel gas. Similarly the abdominal  aorta is only  partially visualized. The inferior vena cava and kidneys are  normal in appearance.   IMPRESSION:  1. There is hepatosplenomegaly.  2. The gallbladder and common bile duct and observed portions of the  pancreas appear normal.  3. The kidneys exhibit no evidence of obstruction nor other acute  abnormality.    Assessment  The primary encounter diagnosis was Chronic pain syndrome. Diagnoses of Multiple sclerosis (HCC) and Failed back surgical syndrome were also pertinent to this visit.  Plan of Care  Problem-specific:  No problem-specific Assessment & Plan notes found for this encounter.  I have discontinued Candy [employer] Esters's Vitamin D3. I am also having her start on HYDROcodone-acetaminophen, HYDROcodone-acetaminophen, morphine, and morphine. Additionally, I am having her maintain her vitamin B-12, insulin regular, accu-chek multiclix, tiZANidine, albuterol, furosemide, mirtazapine, montelukast, oxybutynin, buPROPion, B-D INS SYR ULTRAFINE 1CC/30G, triamcinolone cream, BLOOD GLUCOSE TEST STRIPS, Fifty50 Glucose Meter 2.0, glucose blood, insulin NPH-regular Human, gabapentin, clotrimazole, aspirin, enalapril, HYDROcodone-acetaminophen, and morphine.  Pharmacotherapy (Medications Ordered): Meds ordered this encounter  Medications  . HYDROcodone-acetaminophen (NORCO/VICODIN) 5-325 MG tablet    Sig: Take 1 tablet by mouth every 6 (six) hours as needed for severe pain. Must last 30 days    Dispense:  120 tablet    Refill:  0    Chronic Pain: STOP Act (Not applicable) Fill 1 day early if closed on refill date. Do not fill until: 02/12/2019. To last until: 03/14/2019. Avoid benzodiazepines within 8 hours of opioids  . HYDROcodone-acetaminophen (NORCO/VICODIN) 5-325 MG tablet    Sig: Take 1 tablet by mouth every 6 (six) hours as needed for severe pain. Must last 30 days    Dispense:  120 tablet    Refill:  0    Chronic Pain: STOP Act (Not applicable) Fill 1 day early if closed on refill date. Do  not fill until: 03/14/2019. To last until: 04/13/2019. Avoid benzodiazepines within 8 hours of opioids  . HYDROcodone-acetaminophen (NORCO/VICODIN) 5-325 MG tablet    Sig: Take 1 tablet by mouth every 6 (six) hours as needed for severe pain. Must last 30 days    Dispense:  120 tablet    Refill:  0    Chronic Pain: STOP Act (Not applicable) Fill 1 day early if closed on refill date. Do not fill until: 04/13/2019. To last until: 05/13/2019. Avoid benzodiazepines within 8 hours of opioids  . morphine (MS CONTIN) 30 MG 12 hr tablet    Sig: Take 1 tablet (30 mg total) by mouth every 8 (eight) hours as  needed for pain. Must last 30 days    Dispense:  90 tablet    Refill:  0    Chronic Pain: STOP Act (Not applicable) Fill 1 day early if closed on refill date. Do not fill until: 02/12/2019. To last until: 03/14/2019. Avoid benzodiazepines within 8 hours of opioids  . morphine (MS CONTIN) 30 MG 12 hr tablet    Sig: Take 1 tablet (30 mg total) by mouth every 8 (eight) hours as needed for pain. Must last 30 days    Dispense:  90 tablet    Refill:  0    Chronic Pain: STOP Act (Not applicable) Fill 1 day early if closed on refill date. Do not fill until: 03/14/2019. To last until: 04/13/2019. Avoid benzodiazepines within 8 hours of opioids  . morphine (MS CONTIN) 30 MG 12 hr tablet    Sig: Take 1 tablet (30 mg total) by mouth every 8 (eight) hours as needed for pain. Must last 30 days    Dispense:  90 tablet    Refill:  0    Chronic Pain: STOP Act (Not applicable) Fill 1 day early if closed on refill date. Do not fill until: 04/13/2019. To last until: 05/13/2019. Avoid benzodiazepines within 8 hours of opioids   Orders:  No orders of the defined types were placed in this encounter.  Follow-up plan:   Return in about 3 months (around 05/12/2019) for (VV), (MM).      Interventional therapies: Planned, scheduled, and/or pending:   Not at this time.   Considering:   None at this time.    Palliative PRN treatment(s):    None at this time.      Recent Visits No visits were found meeting these conditions.  Showing recent visits within past 90 days and meeting all other requirements   Today's Visits Date Type Provider Dept  02/12/19 Telemedicine Delano Metz, MD [facility]  Showing today's visits and meeting all other requirements   Future Appointments No visits were found meeting these conditions.  Showing future appointments within next 90 days and meeting all other requirements   I discussed the assessment and treatment plan with the patient. The patient was provided an opportunity to ask questions and all were answered. The patient agreed with the plan and demonstrated an understanding of the instructions.  Patient advised to call back or seek an in-person evaluation if the symptoms or condition worsens.  Duration of encounter: 12 minutes.  Note by: Oswaldo Done, MD Date: 02/12/2019; Time: 12:18 PM

## 2019-05-07 ENCOUNTER — Telehealth: Payer: Self-pay

## 2019-05-07 NOTE — Telephone Encounter (Signed)
No answer, Left message to call our office so we could review medication and alleg for VV on 05/08/19

## 2019-05-07 NOTE — Progress Notes (Signed)
Patient: Tiffany Herring  Service Category: E/M  Provider: Gaspar Cola, MD  DOB: [birth year]  DOS: 05/08/2019  Location: Office  MRN: [id number]  Setting: Ambulatory outpatient  Referring Provider: Hortencia Pilar, MD  Type: Established Patient  Specialty: Interventional Pain Management  PCP: Tiffany Pilar, MD  Location: Remote location  Delivery: TeleHealth     Virtual Encounter - Pain Management PROVIDER NOTE: Information contained herein reflects review and annotations entered in association with encounter. Interpretation of such information and data should be left to medically-trained personnel. Information provided to patient can be located elsewhere in the medical record under "Patient Instructions". Document created using STT-dictation technology, any transcriptional errors that may result from process are unintentional.    Contact & Pharmacy Preferred: [phone] Home: [phone] (home) Mobile: [phone] (mobile) E-mail: [email or web address] [address] [place] [address] Phone: [phone] Fax: [phone]   Pre-screening  Tiffany Herring offered "in-person" vs "virtual" encounter. She indicated preferring virtual for this encounter.   Reason COVID-19*  Social distancing based on CDC and AMA recommendations.   I contacted Tiffany Herring on 05/08/2019 via telephone.      I clearly identified myself as Tiffany Cola, MD. I verified that I was speaking with the correct person using two identifiers (Name: Tiffany Herring, and date of birth: [birth year]).  Consent I sought verbal advanced consent from Tiffany Herring for virtual visit interactions. I informed Tiffany Herring of possible security and privacy concerns, risks, and limitations associated with providing "not-in-person" medical evaluation and management services. I also informed Tiffany Herring of the availability of "in-person" appointments. Finally, I informed her  that there would be a charge for the virtual visit and that she could be  personally, fully or partially, financially responsible for it. Tiffany Herring expressed understanding and agreed to proceed.   Historic Elements   Tiffany Herring is a 59 y.o. year old, female patient evaluated today after her last contact with our practice on 05/07/2019. Tiffany Herring  has a past medical history of Allergy, Arthritis, Asthma, Chronic kidney disease, Depression, Diabetes mellitus without complication ([place]), Hypertension, Multiple sclerosis ([place]), Neuromuscular disorder ([place]), and Sleep apnea. She also  has a past surgical history that includes Cesarean section (1991 and 1996). Tiffany Herring has a current medication list which includes the following prescription(s): albuterol, aspirin, b-d ins syr ultrafine 1cc/30g, fifty50 glucose meter 2.0, bupropion, clotrimazole, enalapril, furosemide, gabapentin, blood glucose test strips, glucose blood, [START ON 05/13/2019] hydrocodone-acetaminophen, [START ON 06/12/2019] hydrocodone-acetaminophen, [START ON 07/12/2019] hydrocodone-acetaminophen, insulin nph-regular human, insulin regular, accu-chek multiclix, mirtazapine, montelukast, [START ON 05/13/2019] morphine, [START ON 06/12/2019] morphine, [START ON 07/12/2019] morphine, oxybutynin, tizanidine, triamcinolone cream, and vitamin b-12. She  reports that she has quit smoking. She has never used smokeless tobacco. She reports that she does not drink alcohol or use drugs. Tiffany Herring is allergic to amoxicillin-pot clavulanate; copaxone  [glatiramer acetate]; pseudoephedrine; rofecoxib; sulfa antibiotics; sulfamethoxazole-trimethoprim; and meloxicam.   HPI  Today, she is being contacted for medication management. The patient indicates doing well with the current medication regimen. No adverse reactions or side effects reported to the medications.   Pharmacotherapy Assessment  Analgesic: Morphine ER 30 mg, 1 tab PO q 8 hrs (90 mg/day) +  hydrocodone/APAP 5/325, 1 tab PO q 6 hrs (20 mg/dayof hydrocodone) MME/day:124m/day.   Monitoring: [place] PMP: PDMP reviewed during this encounter.       Pharmacotherapy: No side-effects or  adverse reactions reported. Compliance: No problems identified. Effectiveness: Clinically acceptable. Plan: Refer to "POC".  UDS:  Summary  Date Value Ref Range Status  02/06/2018 FINAL  Final    Comment:    ==================================================================== TOXASSURE SELECT 13 (MW) ==================================================================== Test                             Result       Flag       Units Drug Present and Declared for Prescription Verification   Morphine                       14455        EXPECTED   ng/mg creat   Normorphine                    489          EXPECTED   ng/mg creat    Potential sources of large amounts of morphine in the absence of    codeine include administration of morphine or use of heroin.    Normorphine is an expected metabolite of morphine.   Hydrocodone                    1013         EXPECTED   ng/mg creat   Hydromorphone                  111          EXPECTED   ng/mg creat   Norhydrocodone                 1245         EXPECTED   ng/mg creat    Sources of hydrocodone include scheduled prescription    medications. Hydromorphone and norhydrocodone are expected    metabolites of hydrocodone. Hydromorphone is also available as a    scheduled prescription medication. ==================================================================== Test                      Result    Flag   Units      Ref Range   Creatinine              53               mg/dL      >=20 ==================================================================== Declared Medications:  The flagging and interpretation on this report are based on the  following declared medications.  Unexpected results may arise from  inaccuracies in the declared medications.  **Note: The  testing scope of this panel includes these medications:  Hydrocodone (Norco)  Hydrocodone (Vicodin)  Morphine (MS Contin)  **Note: The testing scope of this panel does not include following  reported medications:  Acetaminophen (Norco)  Acetaminophen (Vicodin)  Albuterol  Aspirin (Aspirin 81)  Bupropion (Wellbutrin)  Clotrimazole (Mycelex)  Cyanocobalamin  Enalapril (Vasotec)  Furosemide (Lasix)  Gabapentin (Neurontin)  Insulin  Insulin (Humulin)  Insulin (Novolin)  Mirtazapine (Remeron)  Montelukast (Singulair)  Oxybutynin (Ditropan)  Tizanidine (Zanaflex)  Triamcinolone (Kenalog)  Vitamin D3 ==================================================================== For clinical consultation, please call [phone]. ====================================================================    Laboratory Chemistry Profile   Renal Lab Results  Component Value Date   BUN 18 02/06/2018   CREATININE 1.22 (H) 02/06/2018   BCR 15 02/06/2018   GFRAA 56 (L) 02/06/2018   GFRNONAA 49 (L) 02/06/2018     Hepatic  Lab Results  Component Value Date   AST 25 02/06/2018   ALT 19 02/15/2015   ALBUMIN 4.6 02/06/2018   ALKPHOS 97 02/06/2018     Electrolytes Lab Results  Component Value Date   NA 136 02/06/2018   K 4.9 02/06/2018   CL 95 (L) 02/06/2018   CALCIUM 9.8 02/06/2018   MG 1.8 02/06/2018     Bone Lab Results  Component Value Date   25OHVITD1 55 02/06/2018   25OHVITD2 2.9 02/06/2018   25OHVITD3 52 02/06/2018     Inflammation (CRP: Acute Phase) (ESR: Chronic Phase) Lab Results  Component Value Date   CRP 17 (H) 02/06/2018   ESRSEDRATE 28 02/06/2018       Note: Above Lab results reviewed.  Imaging  US Abdomen Complete * PRIOR REPORT IMPORTED FROM AN EXTERNAL SYSTEM *   PRIOR REPORT IMPORTED FROM THE SYNGO WORKFLOW SYSTEM   REASON FOR EXAM:    Enlarged Liver Spleen  COMMENTS:   PROCEDURE:     [place]  - US ABDOMEN GENERAL SURVEY  - Jul 29 2012 10:20AM    RESULT:     The liver is enlarged. It demonstrates increased echo texture  consistent with fatty infiltration. Portal venous flow is normal in  direction toward the liver. There is splenomegaly with maximal dimension  of  the spleen of 16.2 cm.   The gallbladder is adequately distended with no evidence of stones, wall  thickening, or pericholecystic fluid. There is no positive sonographic  Murphy's sign. The common bile duct measures 5.5 mm in diameter.   Evaluation the pancreas is limited by bowel gas. Similarly the abdominal  aorta is only partially visualized. The inferior vena cava and kidneys are  normal in appearance.   IMPRESSION:  1. There is hepatosplenomegaly.  2. The gallbladder and common bile duct and observed portions of the  pancreas appear normal.  3. The kidneys exhibit no evidence of obstruction nor other acute  abnormality.   Dictation Site: 2     Assessment  The primary encounter diagnosis was Chronic pain syndrome. 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  Plan of Care  Problem-specific:  No problem-specific Assessment & Plan notes found for this encounter.  Tiffany Herring has a current medication list which includes the following long-term medication(s): albuterol, bupropion, furosemide, gabapentin, [START ON 05/13/2019] hydrocodone-acetaminophen, [START ON 06/12/2019] hydrocodone-acetaminophen, [START ON 07/12/2019] hydrocodone-acetaminophen, insulin nph-regular human, insulin regular, mirtazapine, montelukast, [START ON 05/13/2019] morphine, [START ON 06/12/2019] morphine, and [START ON 07/12/2019] morphine.  Pharmacotherapy (Medications Ordered): Meds ordered this encounter  Medications  . HYDROcodone-acetaminophen (NORCO/VICODIN) 5-325 MG tablet    Sig: Take 1  tablet by mouth every 6 (six) hours as needed for severe pain. Must last 30 days    Dispense:  120 tablet    Refill:  0    Chronic Pain: STOP Act (Not applicable) Fill 1 day early if closed on refill date. Do not fill until: 05/13/2019. To last until: 06/12/2019. Avoid benzodiazepines within 8 hours of opioids  . HYDROcodone-acetaminophen (NORCO/VICODIN) 5-325 MG tablet    Sig: Take 1 tablet by mouth every 6 (six) hours as needed for severe pain. Must last 30 days    Dispense:  120 tablet    Refill:  0    Chronic Pain: STOP Act (Not applicable) Fill 1 day early if  closed on refill date. Do not fill until: 06/12/2019. To last until: 07/12/2019. Avoid benzodiazepines within 8 hours of opioids  . HYDROcodone-acetaminophen (NORCO/VICODIN) 5-325 MG tablet    Sig: Take 1 tablet by mouth every 6 (six) hours as needed for severe pain. Must last 30 days    Dispense:  120 tablet    Refill:  0    Chronic Pain: STOP Act (Not applicable) Fill 1 day early if closed on refill date. Do not fill until: 07/12/2019. To last until: 08/11/2019. Avoid benzodiazepines within 8 hours of opioids  . morphine (MS CONTIN) 30 MG 12 hr tablet    Sig: Take 1 tablet (30 mg total) by mouth every 8 (eight) hours as needed for pain. Must last 30 days    Dispense:  90 tablet    Refill:  0    Chronic Pain: STOP Act (Not applicable) Fill 1 day early if closed on refill date. Do not fill until: 05/13/2019. To last until: 06/12/2019. Avoid benzodiazepines within 8 hours of opioids  . morphine (MS CONTIN) 30 MG 12 hr tablet    Sig: Take 1 tablet (30 mg total) by mouth every 8 (eight) hours as needed for pain. Must last 30 days    Dispense:  90 tablet    Refill:  0    Chronic Pain: STOP Act (Not applicable) Fill 1 day early if closed on refill date. Do not fill until: 06/12/2019. To last until: 07/12/2019. Avoid benzodiazepines within 8 hours of opioids  . morphine (MS CONTIN) 30 MG 12 hr tablet    Sig: Take 1 tablet (30 mg total) by mouth every 8  (eight) hours as needed for pain. Must last 30 days    Dispense:  90 tablet    Refill:  0    Chronic Pain: STOP Act (Not applicable) Fill 1 day early if closed on refill date. Do not fill until: 07/12/2019. To last until: 08/11/2019. Avoid benzodiazepines within 8 hours of opioids   Orders:  Orders Placed This Encounter  Procedures  . ToxASSURE Select 13 (MW), Urine    Volume: 30 ml(s). Minimum 3 ml of urine is needed. Document temperature of fresh sample. Indications: Long term (current) use of opiate analgesic (K27.062)    Order Specific Question:   Release to patient    Answer:   Immediate   Follow-up plan:   Return in about 3 months (around 08/11/2019).      Interventional therapies: Planned, scheduled, and/or pending:   Not at this time.   Considering:   None at this time.    Palliative PRN treatment(s):   None at this time.       Recent Visits Date Type Provider Dept  02/12/19 Telemedicine Milinda Pointer, MD [facility]  Showing recent visits within past 90 days and meeting all other requirements   Today's Visits Date Type Provider Dept  05/08/19 Telemedicine Milinda Pointer, MD [facility]  Showing today's visits and meeting all other requirements   Future Appointments No visits were found meeting these conditions.  Showing future appointments within next 90 days and meeting all other requirements   I discussed the assessment and treatment plan with the patient. The patient was provided an opportunity to ask questions and all were answered. The patient agreed with the plan and demonstrated an understanding of the instructions.  Patient advised to call back or seek an in-person evaluation if the symptoms or condition worsens.  Duration of encounter: 12 minutes.  Note  by: Tiffany Cola, MD Date: 05/08/2019; Time: 3:50 PM

## 2019-05-08 ENCOUNTER — Other Ambulatory Visit: Payer: Self-pay

## 2019-05-08 ENCOUNTER — Ambulatory Visit: Payer: Medicare HMO | Attending: Pain Medicine | Admitting: Pain Medicine

## 2019-05-08 DIAGNOSIS — Z79899 Other long term (current) drug therapy: Secondary | ICD-10-CM

## 2019-05-08 DIAGNOSIS — M5441 Lumbago with sciatica, right side: Secondary | ICD-10-CM | POA: Diagnosis not present

## 2019-05-08 DIAGNOSIS — G35 Multiple sclerosis: Secondary | ICD-10-CM

## 2019-05-08 DIAGNOSIS — G894 Chronic pain syndrome: Secondary | ICD-10-CM | POA: Diagnosis not present

## 2019-05-08 DIAGNOSIS — M961 Postlaminectomy syndrome, not elsewhere classified: Secondary | ICD-10-CM | POA: Diagnosis not present

## 2019-05-08 DIAGNOSIS — G8929 Other chronic pain: Secondary | ICD-10-CM

## 2019-05-08 DIAGNOSIS — M797 Fibromyalgia: Secondary | ICD-10-CM

## 2019-05-08 DIAGNOSIS — M79604 Pain in right leg: Secondary | ICD-10-CM

## 2019-05-08 MED ORDER — MORPHINE SULFATE ER 30 MG PO TBCR: 30.0000 mg | EXTENDED_RELEASE_TABLET | Freq: Three times a day (TID) | ORAL | 0 refills | Status: DC | PRN

## 2019-05-08 MED ORDER — HYDROCODONE-ACETAMINOPHEN 5-325 MG PO TABS: 1.0000 | ORAL_TABLET | Freq: Four times a day (QID) | ORAL | 0 refills | Status: DC | PRN

## 2019-05-12 ENCOUNTER — Telehealth: Payer: Medicare HMO | Admitting: Pain Medicine

## 2019-05-15 LAB — TOXASSURE SELECT 13 (MW), URINE

## 2019-08-10 NOTE — Progress Notes (Signed)
PROVIDER NOTE: Information contained herein reflects review and annotations entered in association with encounter. Interpretation of such information and data should be left to medically-trained personnel. Information provided to patient can be located elsewhere in the medical record under "Patient Instructions". Document created using STT-dictation technology, any transcriptional errors that may result from process are unintentional.    Patient: Tiffany Herring  Service Category: E/M  Provider: Gaspar Cola, MD  DOB: November 17, 1960  DOS: 08/11/2019  Specialty: Interventional Pain Management  MRN: [id number]  Setting: Ambulatory outpatient  PCP: Hortencia Pilar, MD  Type: Established Patient    Referring Provider: Hortencia Pilar, MD  Location: Office  Delivery: Face-to-face     HPI  Reason for encounter: Tiffany Herring, a 59 y.o. year old female, is here today for evaluation and management of her Chronic pain syndrome [G89.4]. [name] primary complain today is Leg Pain Last encounter: Practice (05/07/2019). My last encounter with her was on Visit date not found. Pertinent problems: [name] has Failed back surgical syndrome; Epidural fibrosis; Chronic low back pain (Bilateral) w/ sciatica (Right); Chronic lumbar radicular pain (Right); Generalized OA; Multiple sclerosis ([place]); Degenerative arthritis of hip; Fibromyalgia; Lumbar spondylosis; Grade 1 Anterolisthesis of L4/L5; Chronic sacroiliac joint pain (bilateral); Chronic hip pain (Left); Arthropathy of left hip (Severe chronic left hip DJD); Osteoarthritis of hip (Left); Lumbar facet arthropathy ([place]); Lumbar facet hypertrophy; Lumbar facet syndrome (Bilateral); Chronic flank pain (Right); Chronic lower extremity pain (Right); Chronic pain syndrome; Venous stasis ulcer ([place]); OA (osteoarthritis); Osteoarthrosis, hip; and Generalized osteoarthritis on their pertinent problem list. Pain Assessment: Severity of Chronic pain is reported as  a 5 /10. Location: Leg  /pain radiaties down to her foot. Onset: More than a month ago. Quality: Shooting. Timing: Constant. Modifying factor(s): meds. Vitals:  height is 5' 2"  (1.575 m) and weight is 317 lb (143.8 kg) (abnormal). Her temperature is 97.3 F (36.3 C) (abnormal). Her blood pressure is 118/63 and her pulse is 94. Her oxygen saturation is 99%.    The patient indicates doing well with the current medication regimen. No adverse reactions or side effects reported to the medications.   Pharmacotherapy Assessment   Analgesic: Morphine ER 30 mg, 1 tab PO q 8 hrs (90 mg/day) + hydrocodone/APAP 5/325, 1 tab PO q 6 hrs (20 mg/dayof hydrocodone) MME/day:183m/day.   Monitoring: [place] PMP: PDMP reviewed during this encounter.       Pharmacotherapy: No side-effects or adverse reactions reported. Compliance: No problems identified. Effectiveness: Clinically acceptable.  BChauncey Fischer RN  08/11/2019  1:52 PM  Sign when Signing Visit Nursing Pain Medication Assessment:  Safety precautions to be maintained throughout the outpatient stay will include: orient to surroundings, keep bed in low position, maintain call bell within reach at all times, provide assistance with transfer out of bed and ambulation.  Medication Inspection Compliance: Pill count conducted under aseptic conditions, in front of the patient. Neither the pills nor the bottle was removed from the patient's sight at any time. Once count was completed pills were immediately returned to the patient in their original bottle.  Medication #1: Hydrocodone/APAP Pill/Patch Count: 2 of 120 pills remain Pill/Patch Appearance: Markings consistent with prescribed medication Bottle Appearance: Standard pharmacy container. Clearly labeled. Filled Date: 7 / 2 / 21 Last Medication intake:  Today  Medication #2: Morphine IR Pill/Patch Count: 72 of 90 pills remain Pill/Patch Appearance: Markings consistent with prescribed medication Bottle  Appearance: Standard pharmacy container. Clearly labeled. Filled Date: 7 / 257/  21 Last Medication intake:  TodaySafety precautions to be maintained throughout the outpatient stay will include: orient to surroundings, keep bed in low position, maintain call bell within reach at all times, provide assistance with transfer out of bed and ambulation.     UDS:  Summary  Date Value Ref Range Status  05/13/2019 Note  Final    Comment:    ==================================================================== [employer] [facility] (MW) ==================================================================== Test                             Result       Flag       Units Drug Present and Declared for Prescription Verification   Morphine                       >7576        EXPECTED   ng/mg creat   Normorphine                    366          EXPECTED   ng/mg creat    Potential sources of large amounts of morphine in the absence of    codeine include administration of morphine or use of heroin.    Normorphine is an expected metabolite of morphine.   Hydrocodone                    1877         EXPECTED   ng/mg creat   Hydromorphone                  214          EXPECTED   ng/mg creat   Dihydrocodeine                 83           EXPECTED   ng/mg creat   Norhydrocodone                 1636         EXPECTED   ng/mg creat    Sources of hydrocodone include scheduled prescription medications.    Hydromorphone, dihydrocodeine and norhydrocodone are expected    metabolites of hydrocodone. Hydromorphone and dihydrocodeine are    also available as scheduled prescription medications. ==================================================================== Test                      Result    Flag   Units      Ref Range   Creatinine              132              mg/dL      >=20 ==================================================================== Declared Medications:  The flagging and interpretation on this report are  based on the  following declared medications.  Unexpected results may arise from  inaccuracies in the declared medications.  **Note: The testing scope of this panel includes these medications:  Hydrocodone (Norco)  Morphine (MS Contin)  **Note: The testing scope of this panel does not include the  following reported medications:  Acetaminophen (Norco)  Albuterol (Ventolin HFA)  Aspirin  Bupropion (Wellbutrin)  Clotrimazole (Mycelex)  Cyanocobalamin  Enalapril (Vasotec)  Furosemide (Lasix)  Gabapentin (Neurontin)  Insulin  Mirtazapine (Remeron)  Montelukast (Singulair)  Oxybutynin (Ditropan)  Tizanidine (Zanaflex)  Triamcinolone (Kenalog) ==================================================================== For clinical consultation, please  call [phone]. ====================================================================      ROS  Constitutional: Denies any fever or chills Gastrointestinal: No reported hemesis, hematochezia, vomiting, or acute GI distress Musculoskeletal: Denies any acute onset joint swelling, redness, loss of ROM, or weakness Neurological: No reported episodes of acute onset apraxia, aphasia, dysarthria, agnosia, amnesia, paralysis, loss of coordination, or loss of consciousness  Medication Review  BLOOD GLUCOSE TEST STRIPS, Fifty50 Glucose Meter 2.0, HYDROcodone-acetaminophen, Insulin Syringe-Needle U-100, accu-chek multiclix, albuterol, aspirin, buPROPion, clotrimazole, enalapril, ergocalciferol, furosemide, gabapentin, glucose blood, insulin NPH-regular Human, insulin regular, mirtazapine, montelukast, morphine, oxybutynin, tiZANidine, and vitamin B-12  History Review  Allergy: Ms. Pinette is allergic to amoxicillin-pot clavulanate, copaxone  [glatiramer acetate], pseudoephedrine, rofecoxib, sulfa antibiotics, sulfamethoxazole-trimethoprim, and meloxicam. Drug: Ms. Klingensmith  reports no history of drug use. Alcohol:  reports no history of alcohol  use. Tobacco:  reports that she has quit smoking. She has never used smokeless tobacco. Social: Ms. Broeker  reports that she has quit smoking. She has never used smokeless tobacco. She reports that she does not drink alcohol and does not use drugs. Medical:  has a past medical history of Allergy, Arthritis, Asthma, Chronic kidney disease, Depression, Diabetes mellitus without complication ([place]), Hypertension, Multiple sclerosis ([place]), Neuromuscular disorder ([place]), and Sleep apnea. Surgical: Ms. Wanner  has a past surgical history that includes Cesarean section (1991 and 1996). Family: family history includes Dementia in her mother; Depression in her mother; Diabetes in her mother; Hyperlipidemia in her mother; Stroke in her father.  Laboratory Chemistry Profile   Renal Lab Results  Component Value Date   BUN 18 02/06/2018   CREATININE 1.22 (H) 02/06/2018   BCR 15 02/06/2018   GFRAA 56 (L) 02/06/2018   GFRNONAA 49 (L) 02/06/2018     Hepatic Lab Results  Component Value Date   AST 25 02/06/2018   ALT 19 02/15/2015   ALBUMIN 4.6 02/06/2018   ALKPHOS 97 02/06/2018     Electrolytes Lab Results  Component Value Date   NA 136 02/06/2018   K 4.9 02/06/2018   CL 95 (L) 02/06/2018   CALCIUM 9.8 02/06/2018   MG 1.8 02/06/2018     Bone Lab Results  Component Value Date   25OHVITD1 55 02/06/2018   25OHVITD2 2.9 02/06/2018   25OHVITD3 52 02/06/2018     Inflammation (CRP: Acute Phase) (ESR: Chronic Phase) Lab Results  Component Value Date   CRP 17 (H) 02/06/2018   ESRSEDRATE 28 02/06/2018       Note: Above Lab results reviewed.  Recent Imaging Review  US Abdomen Complete * PRIOR REPORT IMPORTED FROM AN EXTERNAL SYSTEM *   PRIOR REPORT IMPORTED FROM THE [facility]NGO WORKFLOW SYSTEM   REASON FOR EXAM:    Enlarged Liver Spleen  COMMENTS:   PROCEDURE:     [place]  - US ABDOMEN GENERAL SURVEY  - Jul 29 2012 10:20AM   RESULT:     The liver is enlarged. It demonstrates increased  echo texture  consistent with fatty infiltration. Portal venous flow is normal in  direction toward the liver. There is splenomegaly with maximal dimension  of  the spleen of 16.2 cm.   The gallbladder is adequately distended with no evidence of stones, wall  thickening, or pericholecystic fluid. There is no positive sonographic  Murphy's sign. The common bile duct measures 5.5 mm in diameter.   Evaluation the pancreas is limited by bowel gas. Similarly the abdominal  aorta is only partially visualized. The inferior vena cava and kidneys are  normal  in appearance.   IMPRESSION:  1. There is hepatosplenomegaly.  2. The gallbladder and common bile duct and observed portions of the  pancreas appear normal.  3. The kidneys exhibit no evidence of obstruction nor other acute  abnormality.   Dictation Site: 2    Note: Reviewed        Physical Exam  General appearance: Well nourished, well developed, and well hydrated. In no apparent acute distress Mental status: Alert, oriented x 3 (person, place, & time)       Respiratory: No evidence of acute respiratory distress Eyes: PERLA Vitals: BP 118/63   Pulse 94   Temp (!) 97.3 F (36.3 C)   Ht 5' 2"  (1.575 m)   Wt (!) 317 lb (143.8 kg)   SpO2 99%   BMI 57.98 kg/m  BMI: Estimated body mass index is 57.98 kg/m as calculated from the following:   Height as of this encounter: 5' 2"  (1.575 m).   Weight as of this encounter: 317 lb (143.8 kg). Ideal: Ideal body weight: 50.1 kg (110 lb 7.2 oz) Adjusted ideal body weight: 87.6 kg (193 lb 1.1 oz)  Assessment   Status Diagnosis  Controlled Controlled Controlled 1. Chronic pain syndrome   2. Chronic low back pain (Bilateral) w/ sciatica (Right)   3. Chronic lower extremity pain (Right)   4. Arthropathy of left hip (Severe chronic left hip DJD)   5. Failed back surgical syndrome   6. Pharmacologic therapy   7. Multiple sclerosis ([place])   8. Grade 1 Anterolisthesis of L4/L5   9.  Lumbar facet syndrome (Bilateral)      Updated Problems: Problem  Multiple Sclerosis ([facility])   Overview:  Sees Dr. Otto Herb with [facility]. Ampara at higher doses decreased renal function  Overview:  Sees Dr. Otto Herb with [facility]. Ampara at higher doses decreased renal function  Formatting of this note might be different from the original. Sees Dr. Otto Herb with [facility]. Ampara at higher doses decreased renal function  Formatting of this note might be different from the original. Multiple sclerosis (2001)/ MRI (2001), (8/03), (10/07)   Gerd (Gastroesophageal Reflux Disease)   Overview:  H/O GER/ Neg UGI (4/03)   Formatting of this note might be different from the original. H/O GER/ Neg UGI (4/03)   Vitamin B12 Deficiency   Overview:  B12 deficiency (2010)/ Anti-IF and anti-PC Ab's NEG   Formatting of this note might be different from the original. B12 deficiency (2010)/ Anti-IF and anti-PC Ab's NEG   Obstructive Sleep Apnea   Overview:  OSA (9/06) AHI 26 on Sleep Study/ Auto-titrating BiPAP (2010)   Formatting of this note might be different from the original. OSA (9/06) AHI 26 on Sleep Study/ Auto-titrating BiPAP (2010)   Splenomegaly   Overview:  Splenomegaly on [place] 03/08/12  Hematol consult 04/2012 LDH, CBC WNL;  Hep B/C, CMV, EBV, HIV NEG, ANA NEG,  ?related to IFN, suggested F/U with Neurol, GI Overview:  Sees Dr. Murvin Natal.  16.2 cm on [place] 7/14  Formatting of this note might be different from the original. Sees Dr. Murvin Natal.  16.2 cm on [place] 7/14  Formatting of this note might be different from the original. Splenomegaly on [place] 03/08/12  Hematol consult 04/2012 LDH, CBC WNL;  Hep B/C, CMV, EBV, HIV NEG, ANA NEG,  ?related to IFN, suggested F/U with Neurol, GI   Diabetes Mellitus ([facility])   Overview:  Diabetes (1/10)/ iFBG (2005)   Formatting of this note  might be different from the original. Diabetes (1/10)/ iFBG (2005)    Dysthymia   Overview:  Anxiety/ dysphoria/ decreased mood   Formatting of this note might be different from the original. Gets palpitations.  On wellbutrin, risperdol  Formatting of this note might be different from the original. Anxiety/ dysphoria/ decreased mood   Palpitations   Overview:  Palpitations/ ECHO, Holter (2007)  Overview:  Anxiety related  Formatting of this note might be different from the original. Anxiety related  Formatting of this note might be different from the original. Palpitations/ ECHO, Holter (2007)   Carotid Bruit   Overview:  Carotid bruit/ NEG Carotid Dopplers (3/08)   Formatting of this note might be different from the original. Carotid bruit/ NEG Carotid Dopplers (3/08)   History of Colonic Polyps   Overview:  Adenomatous colon polyp (7/09)/ F/U 85yr   10/1 IMO update  Formatting of this note might be different from the original. Adenomatous colon polyp (7/09)/ F/U 553yr  10/1 IMO update   History of Irregular Menstrual Bleeding   Overview:  Menstrual irregularities/ Menopausal (10/08, 4/10)   Formatting of this note might be different from the original. Menstrual irregularities/ Menopausal (10/08, 4/10)     Plan of Care  Problem-specific:  No problem-specific Assessment & Plan notes found for this encounter.  Ms. KaJohnice Riebeas a current medication list which includes the following long-term medication(s): albuterol, bupropion, furosemide, gabapentin, hydrocodone-acetaminophen, [START ON 09/10/2019] hydrocodone-acetaminophen, [START ON 10/10/2019] hydrocodone-acetaminophen, insulin nph-regular human, insulin regular, mirtazapine, montelukast, morphine, [START ON 09/10/2019] morphine, and [START ON 10/10/2019] morphine.  Pharmacotherapy (Medications Ordered): Meds ordered this encounter  Medications  . HYDROcodone-acetaminophen (NORCO/VICODIN) 5-325 MG tablet    Sig: Take 1 tablet by mouth every 6 (six) hours as needed for  severe pain. Must last 30 days    Dispense:  120 tablet    Refill:  0    Chronic Pain: STOP Act (Not applicable) Fill 1 day early if closed on refill date. Do not fill until: 08/11/2019. To last until: 09/10/2019. Avoid benzodiazepines within 8 hours of opioids  . HYDROcodone-acetaminophen (NORCO/VICODIN) 5-325 MG tablet    Sig: Take 1 tablet by mouth every 6 (six) hours as needed for severe pain. Must last 30 days    Dispense:  120 tablet    Refill:  0    Chronic Pain: STOP Act (Not applicable) Fill 1 day early if closed on refill date. Do not fill until: 09/10/2019. To last until: 10/10/2019. Avoid benzodiazepines within 8 hours of opioids  . HYDROcodone-acetaminophen (NORCO/VICODIN) 5-325 MG tablet    Sig: Take 1 tablet by mouth every 6 (six) hours as needed for severe pain. Must last 30 days    Dispense:  120 tablet    Refill:  0    Chronic Pain: STOP Act (Not applicable) Fill 1 day early if closed on refill date. Do not fill until: 10/10/2019. To last until: 11/09/2019. Avoid benzodiazepines within 8 hours of opioids  . morphine (MS CONTIN) 30 MG 12 hr tablet    Sig: Take 1 tablet (30 mg total) by mouth every 8 (eight) hours as needed for pain. Must last 30 days    Dispense:  90 tablet    Refill:  0    Chronic Pain: STOP Act (Not applicable) Fill 1 day early if closed on refill date. Do not fill until: 08/11/2019. To last until: 09/10/2019. Avoid benzodiazepines within 8 hours of opioids  . morphine (MS CONTIN) 30 MG  12 hr tablet    Sig: Take 1 tablet (30 mg total) by mouth every 8 (eight) hours as needed for pain. Must last 30 days    Dispense:  90 tablet    Refill:  0    Chronic Pain: STOP Act (Not applicable) Fill 1 day early if closed on refill date. Do not fill until: 09/10/2019. To last until: 10/10/2019. Avoid benzodiazepines within 8 hours of opioids  . morphine (MS CONTIN) 30 MG 12 hr tablet    Sig: Take 1 tablet (30 mg total) by mouth every 8 (eight) hours as needed for pain. Must last 30  days    Dispense:  90 tablet    Refill:  0    Chronic Pain: STOP Act (Not applicable) Fill 1 day early if closed on refill date. Do not fill until: 10/10/2019. To last until: 11/09/2019. Avoid benzodiazepines within 8 hours of opioids   Orders:  No orders of the defined types were placed in this encounter.  Follow-up plan:   Return in about 3 months (around 11/05/2019) for VV(78mn), PP-(on eval day).      Interventional therapies: Planned, scheduled, and/or pending:   Not at this time.   Considering:   None at this time.    Palliative PRN treatment(s):   None at this time.     Recent Visits No visits were found meeting these conditions. Showing recent visits within past 90 days and meeting all other requirements Today's Visits Date Type Provider Dept  08/11/19 Office Visit NMilinda Pointer MD [facility]  Showing today's visits and meeting all other requirements Future Appointments Date Type Provider Dept  10/22/19 Appointment NMilinda Pointer MD [facility]  Showing future appointments within next 90 days and meeting all other requirements  I discussed the assessment and treatment plan with the patient. The patient was provided an opportunity to ask questions and all were answered. The patient agreed with the plan and demonstrated an understanding of the instructions.  Patient advised to call back or seek an in-person evaluation if the symptoms or condition worsens.  Duration of encounter: 30 minutes.  Note by: FGaspar Cola MD Date: 08/11/2019; Time: 6:31 PM

## 2019-08-11 ENCOUNTER — Ambulatory Visit: Payer: Medicare HMO | Attending: Pain Medicine | Admitting: Pain Medicine

## 2019-08-11 ENCOUNTER — Encounter: Payer: Self-pay | Admitting: Pain Medicine

## 2019-08-11 ENCOUNTER — Other Ambulatory Visit: Payer: Self-pay

## 2019-08-11 VITALS — BP 118/63 | HR 94 | Temp 97.3°F | Ht 62.0 in | Wt 317.0 lb

## 2019-08-11 DIAGNOSIS — M961 Postlaminectomy syndrome, not elsewhere classified: Secondary | ICD-10-CM | POA: Diagnosis present

## 2019-08-11 DIAGNOSIS — Z79899 Other long term (current) drug therapy: Secondary | ICD-10-CM | POA: Insufficient documentation

## 2019-08-11 DIAGNOSIS — M79604 Pain in right leg: Secondary | ICD-10-CM | POA: Diagnosis not present

## 2019-08-11 DIAGNOSIS — M4310 Spondylolisthesis, site unspecified: Secondary | ICD-10-CM | POA: Diagnosis present

## 2019-08-11 DIAGNOSIS — M5441 Lumbago with sciatica, right side: Secondary | ICD-10-CM | POA: Insufficient documentation

## 2019-08-11 DIAGNOSIS — G35 Multiple sclerosis: Secondary | ICD-10-CM | POA: Diagnosis present

## 2019-08-11 DIAGNOSIS — M47816 Spondylosis without myelopathy or radiculopathy, lumbar region: Secondary | ICD-10-CM | POA: Diagnosis present

## 2019-08-11 DIAGNOSIS — G894 Chronic pain syndrome: Secondary | ICD-10-CM | POA: Insufficient documentation

## 2019-08-11 DIAGNOSIS — G8929 Other chronic pain: Secondary | ICD-10-CM | POA: Insufficient documentation

## 2019-08-11 DIAGNOSIS — M1612 Unilateral primary osteoarthritis, left hip: Secondary | ICD-10-CM | POA: Insufficient documentation

## 2019-08-11 MED ORDER — HYDROCODONE-ACETAMINOPHEN 5-325 MG PO TABS: 1.0000 | ORAL_TABLET | Freq: Four times a day (QID) | ORAL | 0 refills | Status: DC | PRN

## 2019-08-11 MED ORDER — MORPHINE SULFATE ER 30 MG PO TBCR: 30.0000 mg | EXTENDED_RELEASE_TABLET | Freq: Three times a day (TID) | ORAL | 0 refills | Status: DC | PRN

## 2019-08-11 NOTE — Progress Notes (Signed)
Nursing Pain Medication Assessment:  Safety precautions to be maintained throughout the outpatient stay will include: orient to surroundings, keep bed in low position, maintain call bell within reach at all times, provide assistance with transfer out of bed and ambulation.  Medication Inspection Compliance: Pill count conducted under aseptic conditions, in front of the patient. Neither the pills nor the bottle was removed from the patient's sight at any time. Once count was completed pills were immediately returned to the patient in their original bottle.  Medication #1: Hydrocodone/APAP Pill/Patch Count: 2 of 120 pills remain Pill/Patch Appearance: Markings consistent with prescribed medication Bottle Appearance: Standard pharmacy container. Clearly labeled. Filled Date: 7 / 2 / 21 Last Medication intake:  Today  Medication #2: Morphine IR Pill/Patch Count: 72 of 90 pills remain Pill/Patch Appearance: Markings consistent with prescribed medication Bottle Appearance: Standard pharmacy container. Clearly labeled. Filled Date: 7 / 26 / 21 Last Medication intake:  TodaySafety precautions to be maintained throughout the outpatient stay will include: orient to surroundings, keep bed in low position, maintain call bell within reach at all times, provide assistance with transfer out of bed and ambulation.

## 2019-08-11 NOTE — Patient Instructions (Addendum)
____________________________________________________________________________________________  Drug Holidays (Slow)  What is a "Drug Holiday"? Drug Holiday: is the name given to the period of time during which a patient stops taking a medication(s) for the purpose of eliminating tolerance to the drug.  Benefits . Improved effectiveness of opioids. . Decreased opioid dose needed to achieve benefits. . Improved pain with lesser dose.  What is tolerance? Tolerance: is the progressive decreased in effectiveness of a drug due to its repetitive use. With repetitive use, the body gets use to the medication and as a consequence, it loses its effectiveness. This is a common problem seen with opioid pain medications. As a result, a larger dose of the drug is needed to achieve the same effect that used to be obtained with a smaller dose.  How long should a "Drug Holiday" last? You should stay off of the pain medicine for at least 14 consecutive days. (2 weeks)  Should I stop the medicine "cold turkey"? No. You should always coordinate with your Pain Specialist so that he/she can provide you with the correct medication dose to make the transition as smoothly as possible.  How do I stop the medicine? Slowly. You will be instructed to decrease the daily amount of pills that you take by one (1) pill every seven (7) days. This is called a "slow downward taper" of your dose. For example: if you normally take four (4) pills per day, you will be asked to drop this dose to three (3) pills per day for seven (7) days, then to two (2) pills per day for seven (7) days, then to one (1) per day for seven (7) days, and at the end of those last seven (7) days, this is when the "Drug Holiday" would start.   Will I have withdrawals? By doing a "slow downward taper" like this one, it is unlikely that you will experience any significant withdrawal symptoms. Typically, what triggers withdrawals is the sudden stop of a high  dose opioid therapy. Withdrawals can usually be avoided by slowly decreasing the dose over a prolonged period of time. If you do not follow these instructions and decide to stop your medication abruptly, withdrawals may be possible.  What are withdrawals? Withdrawals: refers to the wide range of symptoms that occur after stopping or dramatically reducing opiate drugs after heavy and prolonged use. Withdrawal symptoms do not occur to patients that use low dose opioids, or those who take the medication sporadically. Contrary to benzodiazepine (example: Valium, Xanax, etc.) or alcohol withdrawals ("Delirium Tremens"), opioid withdrawals are not lethal. Withdrawals are the physical manifestation of the body getting rid of the excess receptors.  Expected Symptoms Early symptoms of withdrawal may include: . Agitation . Anxiety . Muscle aches . Increased tearing . Insomnia . Runny nose . Sweating . Yawning  Late symptoms of withdrawal may include: . Abdominal cramping . Diarrhea . Dilated pupils . Goose bumps . Nausea . Vomiting  Will I experience withdrawals? Due to the slow nature of the taper, it is very unlikely that you will experience any.  What is a slow taper? Taper: refers to the gradual decrease in dose.  (Last update: 07/30/2019) ____________________________________________________________________________________________    ____________________________________________________________________________________________  Medication Rules  Purpose: To inform patients, and their family members, of our rules and regulations.  Applies to: All patients receiving prescriptions (written or electronic).  Pharmacy of record: Pharmacy where electronic prescriptions will be sent. If written prescriptions are taken to a different pharmacy, please inform the nursing staff. The pharmacy   listed in the electronic medical record should be the one where you would like electronic prescriptions  to be sent.  Electronic prescriptions: In compliance with the [place] Strengthen Opioid Misuse Prevention (STOP) Act of 2017 (Session Law 2017-74/H243), effective January 09, 2018, all controlled substances must be electronically prescribed. Calling prescriptions to the pharmacy will cease to exist.  Prescription refills: Only during scheduled appointments. Applies to all prescriptions.  NOTE: The following applies primarily to controlled substances (Opioid* Pain Medications).   Type of encounter (visit): For patients receiving controlled substances, face-to-face visits are required. (Not an option or up to the patient.)  Patient's responsibilities: 1. Pain Pills: Bring all pain pills to every appointment (except for procedure appointments). 2. Pill Bottles: Bring pills in original pharmacy bottle. Always bring the newest bottle. Bring bottle, even if empty. 3. Medication refills: You are responsible for knowing and keeping track of what medications you take and those you need refilled. The day before your appointment: write a list of all prescriptions that need to be refilled. The day of the appointment: give the list to the admitting nurse. Prescriptions will be written only during appointments. No prescriptions will be written on procedure days. If you forget a medication: it will not be "Called in", "Faxed", or "electronically sent". You will need to get another appointment to get these prescribed. No early refills. Do not call asking to have your prescription filled early. 4. Prescription Accuracy: You are responsible for carefully inspecting your prescriptions before leaving our office. Have the discharge nurse carefully go over each prescription with you, before taking them home. Make sure that your name is accurately spelled, that your address is correct. Check the name and dose of your medication to make sure it is accurate. Check the number of pills, and the written instructions to  make sure they are clear and accurate. Make sure that you are given enough medication to last until your next medication refill appointment. 5. Taking Medication: Take medication as prescribed. When it comes to controlled substances, taking less pills or less frequently than prescribed is permitted and encouraged. Never take more pills than instructed. Never take medication more frequently than prescribed.  6. Inform other Doctors: Always inform, all of your healthcare providers, of all the medications you take. 7. Pain Medication from other Providers: You are not allowed to accept any additional pain medication from any other Doctor or Healthcare provider. There are two exceptions to this rule. (see below) In the event that you require additional pain medication, you are responsible for notifying us, as stated below. 8. Medication Agreement: You are responsible for carefully reading and following our Medication Agreement. This must be signed before receiving any prescriptions from our practice. Safely store a copy of your signed Agreement. Violations to the Agreement will result in no further prescriptions. (Additional copies of our Medication Agreement are available upon request.) 9. Laws, Rules, & Regulations: All patients are expected to follow all Federal and State Laws, Statutes, Rules, & Regulations. Ignorance of the Laws does not constitute a valid excuse.  10. Illegal drugs and Controlled Substances: The use of illegal substances (including, but not limited to marijuana and its derivatives) and/or the illegal use of any controlled substances is strictly prohibited. Violation of this rule may result in the immediate and permanent discontinuation of any and all prescriptions being written by our practice. The use of any illegal substances is prohibited. 11. Adopted CDC guidelines & recommendations: Target dosing levels will be at or   below 60 MME/day. Use of benzodiazepines** is not  recommended.  Exceptions: There are only two exceptions to the rule of not receiving pain medications from other Healthcare Providers. 1. Exception #1 (Emergencies): In the event of an emergency (i.e.: accident requiring emergency care), you are allowed to receive additional pain medication. However, you are responsible for: As soon as you are able, call our office [phone], at any time of the day or night, and leave a message stating your name, the date and nature of the emergency, and the name and dose of the medication prescribed. In the event that your call is answered by a member of our staff, make sure to document and save the date, time, and the name of the person that took your information.  2. Exception #2 (Planned Surgery): In the event that you are scheduled by another doctor or dentist to have any type of surgery or procedure, you are allowed (for a period no longer than 30 days), to receive additional pain medication, for the acute post-op pain. However, in this case, you are responsible for picking up a copy of our "Post-op Pain Management for Surgeons" handout, and giving it to your surgeon or dentist. This document is available at our office, and does not require an appointment to obtain it. Simply go to our office during business hours (Monday-Thursday from 8:00 AM to 4:00 PM) (Friday 8:00 AM to 12:00 Noon) or if you have a scheduled appointment with us, prior to your surgery, and ask for it by name. In addition, you will need to provide us with your name, name of your surgeon, type of surgery, and date of procedure or surgery.  *Opioid medications include: morphine, codeine, oxycodone, oxymorphone, hydrocodone, hydromorphone, meperidine, tramadol, tapentadol, buprenorphine, fentanyl, methadone. **Benzodiazepine medications include: diazepam (Valium), alprazolam (Xanax), clonazepam (Klonopine), lorazepam (Ativan), clorazepate (Tranxene), chlordiazepoxide (Librium), estazolam (Prosom),  oxazepam (Serax), temazepam (Restoril), triazolam (Halcion) (Last updated: 03/08/2017) ____________________________________________________________________________________________   ____________________________________________________________________________________________  Medication Recommendations and Reminders  Applies to: All patients receiving prescriptions (written and/or electronic).  Medication Rules & Regulations: These rules and regulations exist for your safety and that of others. They are not flexible and neither are we. Dismissing or ignoring them will be considered "non-compliance" with medication therapy, resulting in complete and irreversible termination of such therapy. (See document titled "Medication Rules" for more details.) In all conscience, because of safety reasons, we cannot continue providing a therapy where the patient does not follow instructions.  Pharmacy of record:   Definition: This is the pharmacy where your electronic prescriptions will be sent.   We do not endorse any particular pharmacy, however, we have experienced problems with [facility] not securing enough medication supply for the community.  We do not restrict you in your choice of pharmacy. However, once we write for your prescriptions, we will NOT be re-sending more prescriptions to fix restricted supply problems created by your pharmacy, or your insurance.   The pharmacy listed in the electronic medical record should be the one where you want electronic prescriptions to be sent.  If you choose to change pharmacy, simply notify our nursing staff.  Recommendations:  Keep all of your pain medications in a safe place, under lock and key, even if you live alone. We will NOT replace lost, stolen, or damaged medication.  After you fill your prescription, take 1 week's worth of pills and put them away in a safe place. You should keep a separate, properly labeled bottle for this purpose. The remainder    should be kept in the original bottle. Use this as your primary supply, until it runs out. Once it's gone, then you know that you have 1 week's worth of medicine, and it is time to come in for a prescription refill. If you do this correctly, it is unlikely that you will ever run out of medicine.  To make sure that the above recommendation works, it is very important that you make sure your medication refill appointments are scheduled at least 1 week before you run out of medicine. To do this in an effective manner, make sure that you do not leave the office without scheduling your next medication management appointment. Always ask the nursing staff to show you in your prescription , when your medication will be running out. Then arrange for the receptionist to get you a return appointment, at least 7 days before you run out of medicine. Do not wait until you have 1 or 2 pills left, to come in. This is very poor planning and does not take into consideration that we may need to cancel appointments due to bad weather, sickness, or emergencies affecting our staff.  DO NOT ACCEPT A "Partial Fill": If for any reason your pharmacy does not have enough pills/tablets to completely fill or refill your prescription, do not allow for a "partial fill". The law allows the pharmacy to complete that prescription within 72 hours, without requiring a new prescription. If they do not fill the rest of your prescription within those 72 hours, you will need a separate prescription to fill the remaining amount, which we will NOT provide. If the reason for the partial fill is your insurance, you will need to talk to the pharmacist about payment alternatives for the remaining tablets, but again, DO NOT ACCEPT A PARTIAL FILL, unless you can trust your pharmacist to obtain the remainder of the pills within 72 hours.  Prescription refills and/or changes in medication(s):   Prescription refills, and/or changes in dose or medication,  will be conducted only during scheduled medication management appointments. (Applies to both, written and electronic prescriptions.)  No refills on procedure days. No medication will be changed or started on procedure days. No changes, adjustments, and/or refills will be conducted on a procedure day. Doing so will interfere with the diagnostic portion of the procedure.  No phone refills. No medications will be "called into the pharmacy".  No Fax refills.  No weekend refills.  No Holliday refills.  No after hours refills.  Remember:  Business hours are:  Monday to Thursday 8:00 AM to 4:00 PM Provider's Schedule: Anah Billard, MD - Appointments are:  Medication management: Monday and Wednesday 8:00 AM to 4:00 PM Procedure day: Tuesday and Thursday 7:30 AM to 4:00 PM Bilal Lateef, MD - Appointments are:  Medication management: Tuesday and Thursday 8:00 AM to 4:00 PM Procedure day: Monday and Wednesday 7:30 AM to 4:00 PM (Last update: 07/30/2019) ____________________________________________________________________________________________   ____________________________________________________________________________________________  CANNABIDIOL (AKA: CBD Oil or Pills)  Applies to: All patients receiving prescriptions of controlled substances (written and/or electronic).  General Information: Cannabidiol (CBD), a derivative of Marijuana, was discovered in 1940. It is one of some 113 identified cannabinoids in cannabis (Marijuana) plants, accounting for up to 40% of the plant's extract. As of 2018, preliminary clinical research on cannabidiol included studies of anxiety, cognition, movement disorders, and pain.  Cannabidiol is consummed in multiple ways, including inhalation of cannabis smoke or vapor, as an aerosol spray into the cheek, and by mouth. It   may be supplied as CBD oil containing CBD as the active ingredient (no added tetrahydrocannabinol (THC) or terpenes), a full-plant  CBD-dominant hemp extract oil, capsules, dried cannabis, or as a liquid solution. CBD is thought not have the same psychoactivity as THC, and may affect the actions of THC. Studies suggest that CBD may interact with different biological targets, including cannabinoid receptors and other neurotransmitter receptors. As of 2018 the mechanism of action for its biological effects has not been determined.  In the [place], cannabidiol has a limited approval by the Food and Drug Administration (FDA) for treatment of only two types of epilepsy disorders. The side effects of long-term use of the drug include somnolence, decreased appetite, diarrhea, fatigue, malaise, weakness, sleeping problems, and others.  CBD remains a Schedule I drug prohibited for any use.  Legality: Some manufacturers ship CBD products nationally, an illegal action which the FDA has not enforced in 2018, with CBD remaining the subject of an FDA investigational new drug evaluation, and is not considered legal as a dietary supplement or food ingredient as of December 2018. Federal illegality has made it difficult historically to conduct research on CBD. CBD is openly sold in head shops and health food stores in some states where such sales have not been explicitly legalized.  Warning: Because it is not FDA approved for general use or treatment of pain, it is not required to undergo the same manufacturing controls as prescription drugs.  This means that the available cannabidiol (CBD) may be contaminated with THC.  If this is the case, it will trigger a positive urine drug screen (UDS) test for cannabinoids (Marijuana).  Because a positive UDS for illicit substances is a violation of our medication agreement, your opioid analgesics (pain medicine) may be permanently discontinued. (Last update:  07/30/2019) ____________________________________________________________________________________________   ____________________________________________________________________________________________  Virtual Visits   Eligibility In order to be eligible for "Virtual Visit Encounters", you must be available over the phone. We understand how people are reluctant to pickup on "unknown" calls. We suggest adding the following telephone numbers to your phone, as a "CONTACT".  When will this type of visits be use? On a case by case, as decided by your provider.  Can I request my medication visits to be "Virtual"? No. This is decided by your provider. Control substances require specific monitoring that requires Face-to-Face encounters. The number of encounters  and the extent of the monitoring is determined on a case by case basis.  Add a new contact to your smart phone and label it "PAIN CLINIC" Under this contact add the following numbers: Main: [phone] Nurses: [phone] Dr. Laban Emperor: [phone] or [phone]  ____________________________________________________________________________________________

## 2019-10-21 ENCOUNTER — Telehealth: Payer: Self-pay

## 2019-10-21 ENCOUNTER — Encounter: Payer: Self-pay | Admitting: Pain Medicine

## 2019-10-21 NOTE — Progress Notes (Signed)
Patient: Tiffany Herring  Service Category: E/M  Provider: Gaspar Cola, MD  DOB: [birth year]  DOS: 10/22/2019  Location: Office  MRN: [id number]  Setting: Ambulatory outpatient  Referring Provider: Hortencia Pilar, MD  Type: Established Patient  Specialty: Interventional Pain Management  PCP: Hortencia Pilar, MD  Location: Remote location  Delivery: TeleHealth     Virtual Encounter - Pain Management PROVIDER NOTE: Information contained herein reflects review and annotations entered in association with encounter. Interpretation of such information and data should be left to medically-trained personnel. Information provided to patient can be located elsewhere in the medical record under "Patient Instructions". Document created using STT-dictation technology, any transcriptional errors that may result from process are unintentional.    Contact & Pharmacy Preferred: [phone] Home: [phone] (home) Mobile: [phone] (mobile) E-mail: [email or web address] [place] [address] [place] [address] Phone: [phone] Fax: [phone]   Pre-screening  Tiffany Herring offered "in-person" vs "virtual" encounter. She indicated preferring virtual for this encounter.   Reason COVID-19*  Social distancing based on CDC and AMA recommendations.   I contacted Tiffany Herring on 10/22/2019 via telephone.      I clearly identified myself as Gaspar Cola, MD. I verified that I was speaking with the correct person using two identifiers (Name: Tiffany Herring, and date of birth: 1960/07/21).  Consent I sought verbal advanced consent from Tiffany Herring for virtual visit interactions. I informed Tiffany Herring of possible security and privacy concerns, risks, and limitations associated with providing "not-in-person" medical evaluation and management services. I also informed Tiffany Herring of the availability of "in-person" appointments. Finally, I informed her  that there would be a charge for the virtual visit and that she could be  personally, fully or partially, financially responsible for it. Tiffany Herring expressed understanding and agreed to proceed.   Historic Elements   Tiffany Herring is a 59 y.o. year old, female patient evaluated today after our last contact on 08/11/2019. Tiffany Herring  has a past medical history of Allergy, Arthritis, Asthma, Chronic kidney disease, Depression, Diabetes mellitus without complication ([place]), Hypertension, Multiple sclerosis ([place]), Neuromuscular disorder ([place]), and Sleep apnea. She also  has a past surgical history that includes Cesarean section (1991 and 1996). Tiffany Herring has a current medication list which includes the following prescription(s): albuterol, aspirin, b-d ins syr ultrafine 1cc/30g, fifty50 glucose meter 2.0, bupropion, clotrimazole, enalapril, ergocalciferol, furosemide, gabapentin, blood glucose test strips, glucose blood, [START ON 11/09/2019] hydrocodone-acetaminophen, [START ON 12/09/2019] hydrocodone-acetaminophen, [START ON 01/08/2020] hydrocodone-acetaminophen, insulin nph-regular human, insulin regular, accu-chek multiclix, mirtazapine, montelukast, [START ON 11/09/2019] morphine, [START ON 12/09/2019] morphine, [START ON 01/08/2020] morphine, oxybutynin, tizanidine, and vitamin b-12. She  reports that she has quit smoking. She has never used smokeless tobacco. She reports that she does not drink alcohol and does not use drugs. Tiffany Herring is allergic to amoxicillin-pot clavulanate, copaxone  [glatiramer acetate], pseudoephedrine, rofecoxib, sulfa antibiotics, sulfamethoxazole-trimethoprim, and meloxicam.   HPI  Today, she is being contacted for medication management. The patient indicates doing well with the current medication regimen. No adverse reactions or side effects reported to the medications.   Pharmacotherapy Assessment  Analgesic: Morphine ER 30 mg, 1 tab PO q 8 hrs (90 mg/day) +  hydrocodone/APAP 5/325, 1 tab PO q 6 hrs (20 mg/dayof hydrocodone) MME/day:147m/day.   Monitoring: [place] PMP: PDMP reviewed during this encounter.       Pharmacotherapy: No side-effects or adverse reactions  reported. Compliance: No problems identified. Effectiveness: Clinically acceptable. Plan: Refer to "POC".  UDS:  Summary  Date Value Ref Range Status  05/13/2019 Note  Final    Comment:    ==================================================================== [employer] 13 (MW) ==================================================================== Test                             Result       Flag       Units Drug Present and Declared for Prescription Verification   Morphine                       >7576        EXPECTED   ng/mg creat   Normorphine                    366          EXPECTED   ng/mg creat    Potential sources of large amounts of morphine in the absence of    codeine include administration of morphine or use of heroin.    Normorphine is an expected metabolite of morphine.   Hydrocodone                    1877         EXPECTED   ng/mg creat   Hydromorphone                  214          EXPECTED   ng/mg creat   Dihydrocodeine                 83           EXPECTED   ng/mg creat   Norhydrocodone                 1636         EXPECTED   ng/mg creat    Sources of hydrocodone include scheduled prescription medications.    Hydromorphone, dihydrocodeine and norhydrocodone are expected    metabolites of hydrocodone. Hydromorphone and dihydrocodeine are    also available as scheduled prescription medications. ==================================================================== Test                      Result    Flag   Units      Ref Range   Creatinine              132              mg/dL      >=20 ==================================================================== Declared Medications:  The flagging and interpretation on this report are based on the  following declared  medications.  Unexpected results may arise from  inaccuracies in the declared medications.  **Note: The testing scope of this panel includes these medications:  Hydrocodone (Norco)  Morphine (MS Contin)  **Note: The testing scope of this panel does not include the  following reported medications:  Acetaminophen (Norco)  Albuterol (Ventolin HFA)  Aspirin  Bupropion (Wellbutrin)  Clotrimazole (Mycelex)  Cyanocobalamin  Enalapril (Vasotec)  Furosemide (Lasix)  Gabapentin (Neurontin)  Insulin  Mirtazapine (Remeron)  Montelukast (Singulair)  Oxybutynin (Ditropan)  Tizanidine (Zanaflex)  Triamcinolone (Kenalog) ==================================================================== For clinical consultation, please call [phone]. ====================================================================     Laboratory Chemistry Profile   Renal Lab Results  Component Value Date   BUN 18 02/06/2018   CREATININE 1.22 (H) 02/06/2018  BCR 15 02/06/2018   GFRAA 56 (L) 02/06/2018   GFRNONAA 49 (L) 02/06/2018     Hepatic Lab Results  Component Value Date   AST 25 02/06/2018   ALT 19 02/15/2015   ALBUMIN 4.6 02/06/2018   ALKPHOS 97 02/06/2018     Electrolytes Lab Results  Component Value Date   NA 136 02/06/2018   K 4.9 02/06/2018   CL 95 (L) 02/06/2018   CALCIUM 9.8 02/06/2018   MG 1.8 02/06/2018     Bone Lab Results  Component Value Date   25OHVITD1 55 02/06/2018   25OHVITD2 2.9 02/06/2018   25OHVITD3 52 02/06/2018     Inflammation (CRP: Acute Phase) (ESR: Chronic Phase) Lab Results  Component Value Date   CRP 17 (H) 02/06/2018   ESRSEDRATE 28 02/06/2018       Note: Above Lab results reviewed.  Imaging  US Abdomen Complete * PRIOR REPORT IMPORTED FROM AN EXTERNAL SYSTEM *   PRIOR REPORT IMPORTED FROM THE SYNGO WORKFLOW SYSTEM   REASON FOR EXAM:    Enlarged Liver Spleen  COMMENTS:   PROCEDURE:     [place]  - US ABDOMEN GENERAL SURVEY  - Jul 29 2012  10:20AM   RESULT:     The liver is enlarged. It demonstrates increased echo texture  consistent with fatty infiltration. Portal venous flow is normal in  direction toward the liver. There is splenomegaly with maximal dimension  of  the spleen of 16.2 cm.   The gallbladder is adequately distended with no evidence of stones, wall  thickening, or pericholecystic fluid. There is no positive sonographic  Murphy's sign. The common bile duct measures 5.5 mm in diameter.   Evaluation the pancreas is limited by bowel gas. Similarly the abdominal  aorta is only partially visualized. The inferior vena cava and kidneys are  normal in appearance.   IMPRESSION:  1. There is hepatosplenomegaly.  2. The gallbladder and common bile duct and observed portions of the  pancreas appear normal.  3. The kidneys exhibit no evidence of obstruction nor other acute  abnormality.   Dictation Site: 2     Assessment  The primary encounter diagnosis was Chronic pain syndrome. 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  Plan of Care  Problem-specific:  No problem-specific Assessment & Plan notes found for this encounter.  Ms. Pamula Luther has a current medication list which includes the following long-term medication(s): albuterol, bupropion, furosemide, gabapentin, [START ON 11/09/2019] hydrocodone-acetaminophen, [START ON 12/09/2019] hydrocodone-acetaminophen, [START ON 01/08/2020] hydrocodone-acetaminophen, insulin nph-regular human, insulin regular, mirtazapine, montelukast, [START ON 11/09/2019] morphine, [START ON 12/09/2019] morphine, and [START ON 01/08/2020] morphine.  Pharmacotherapy (Medications Ordered): Meds ordered this encounter  Medications  . HYDROcodone-acetaminophen (NORCO/VICODIN) 5-325 MG tablet    Sig: Take 1 tablet by mouth every 6  (six) hours as needed for severe pain. Must last 30 days    Dispense:  120 tablet    Refill:  0    Chronic Pain: STOP Act (Not applicable) Fill 1 day early if closed on refill date. Avoid benzodiazepines within 8 hours of opioids  . morphine (MS CONTIN) 30 MG 12 hr tablet    Sig: Take 1 tablet (30 mg total) by mouth every 8 (eight) hours as needed for pain. Must last 30 days    Dispense:  90 tablet    Refill:  0  Chronic Pain: STOP Act (Not applicable) Fill 1 day early if closed on refill date. Avoid benzodiazepines within 8 hours of opioids  . morphine (MS CONTIN) 30 MG 12 hr tablet    Sig: Take 1 tablet (30 mg total) by mouth every 8 (eight) hours as needed for pain. Must last 30 days    Dispense:  90 tablet    Refill:  0    Chronic Pain: STOP Act (Not applicable) Fill 1 day early if closed on refill date. Avoid benzodiazepines within 8 hours of opioids  . morphine (MS CONTIN) 30 MG 12 hr tablet    Sig: Take 1 tablet (30 mg total) by mouth every 8 (eight) hours as needed for pain. Must last 30 days    Dispense:  90 tablet    Refill:  0    Chronic Pain: STOP Act (Not applicable) Fill 1 day early if closed on refill date. Avoid benzodiazepines within 8 hours of opioids  . HYDROcodone-acetaminophen (NORCO/VICODIN) 5-325 MG tablet    Sig: Take 1 tablet by mouth every 6 (six) hours as needed for severe pain. Must last 30 days    Dispense:  120 tablet    Refill:  0    Chronic Pain: STOP Act (Not applicable) Fill 1 day early if closed on refill date. Avoid benzodiazepines within 8 hours of opioids  . HYDROcodone-acetaminophen (NORCO/VICODIN) 5-325 MG tablet    Sig: Take 1 tablet by mouth every 6 (six) hours as needed for severe pain. Must last 30 days    Dispense:  120 tablet    Refill:  0    Chronic Pain: STOP Act (Not applicable) Fill 1 day early if closed on refill date. Avoid benzodiazepines within 8 hours of opioids   Orders:  No orders of the defined types were placed in this  encounter.  Follow-up plan:   Return in about 4 months (around 02/07/2020) for (F2F), (Med Mgmt).      Interventional therapies: Planned, scheduled, and/or pending:   Not at this time.   Considering:   None at this time.    Palliative PRN treatment(s):   None at this time.      Recent Visits Date Type Provider Dept  08/11/19 Office Visit Milinda Pointer, MD [facility]  Showing recent visits within past 90 days and meeting all other requirements Today's Visits Date Type Provider Dept  10/22/19 Telemedicine Milinda Pointer, MD [facility]  Showing today's visits and meeting all other requirements Future Appointments No visits were found meeting these conditions. Showing future appointments within next 90 days and meeting all other requirements  I discussed the assessment and treatment plan with the patient. The patient was provided an opportunity to ask questions and all were answered. The patient agreed with the plan and demonstrated an understanding of the instructions.  Patient advised to call back or seek an in-person evaluation if the symptoms or condition worsens.  Duration of encounter: 12 minutes.  Note by: Gaspar Cola, MD Date: 10/22/2019; Time: 4:10 PM

## 2019-10-22 ENCOUNTER — Ambulatory Visit: Payer: Medicare HMO | Attending: Pain Medicine | Admitting: Pain Medicine

## 2019-10-22 ENCOUNTER — Other Ambulatory Visit: Payer: Self-pay

## 2019-10-22 DIAGNOSIS — G894 Chronic pain syndrome: Secondary | ICD-10-CM | POA: Diagnosis not present

## 2019-10-22 DIAGNOSIS — G35 Multiple sclerosis: Secondary | ICD-10-CM | POA: Diagnosis not present

## 2019-10-22 DIAGNOSIS — Z79899 Other long term (current) drug therapy: Secondary | ICD-10-CM

## 2019-10-22 DIAGNOSIS — M79604 Pain in right leg: Secondary | ICD-10-CM | POA: Diagnosis not present

## 2019-10-22 DIAGNOSIS — M5441 Lumbago with sciatica, right side: Secondary | ICD-10-CM

## 2019-10-22 DIAGNOSIS — G8929 Other chronic pain: Secondary | ICD-10-CM

## 2019-10-22 MED ORDER — HYDROCODONE-ACETAMINOPHEN 5-325 MG PO TABS: 1.0000 | ORAL_TABLET | Freq: Four times a day (QID) | ORAL | 0 refills | Status: DC | PRN

## 2019-10-22 MED ORDER — MORPHINE SULFATE ER 30 MG PO TBCR: 30.0000 mg | EXTENDED_RELEASE_TABLET | Freq: Three times a day (TID) | ORAL | 0 refills | Status: DC | PRN

## 2020-01-24 ENCOUNTER — Encounter: Payer: Self-pay | Admitting: Pain Medicine

## 2020-01-27 DIAGNOSIS — F1120 Opioid dependence, uncomplicated: Secondary | ICD-10-CM | POA: Insufficient documentation

## 2020-01-27 NOTE — Progress Notes (Signed)
Patient: Tiffany Herring  Service Category: E/M  Provider: Gaspar Cola, MD  DOB: 1960/09/08  DOS: 01/28/2020  Location: Office  MRN: [id number]  Setting: Ambulatory outpatient  Referring Provider: Hortencia Pilar, MD  Type: Established Patient  Specialty: Interventional Pain Management  PCP: Hortencia Pilar, MD  Location: Remote location  Delivery: TeleHealth     Virtual Encounter - Pain Management PROVIDER NOTE: Information contained herein reflects review and annotations entered in association with encounter. Interpretation of such information and data should be left to medically-trained personnel. Information provided to patient can be located elsewhere in the medical record under "Patient Instructions". Document created using STT-dictation technology, any transcriptional errors that may result from process are unintentional.    Contact & Pharmacy Preferred: [phone] Home: [phone] (home) Mobile: [phone] (mobile) E-mail: [email or web address]  [place], [place] [address] [place] [address] Phone: [phone] Fax: [phone]   Pre-screening  Ms. Dannenberg offered "in-person" vs "virtual" encounter. She indicated preferring virtual for this encounter.   Reason COVID-19*  Social distancing based on CDC and AMA recommendations.   I contacted April Holding on 01/28/2020 via telephone.      I clearly identified myself as Gaspar Cola, MD. I verified that I was speaking with the correct person using two identifiers (Name: Azjah Pardo, and date of birth: Oct 22, 1960).  Consent I sought verbal advanced consent from April Holding for virtual visit interactions. I informed Ms. Rasp of possible security and privacy concerns, risks, and limitations associated with providing "not-in-person" medical evaluation and management services. I also informed Ms. Trawick of the availability of "in-person" appointments. Finally, I informed her  that there would be a charge for the virtual visit and that she could be  personally, fully or partially, financially responsible for it. Ms. Panek expressed understanding and agreed to proceed.   Historic Elements   Ms. Hadyn Azer is a 60 y.o. year old, female patient evaluated today after our last contact on 08/11/2019. [name]  has a past medical history of Allergy, Arthritis, Asthma, Chronic kidney disease, Depression, Diabetes mellitus without complication ([place]), Hypertension, Multiple sclerosis ([place]), Neuromuscular disorder ([place]), and Sleep apnea. She also  has a past surgical history that includes Cesarean section (1991 and 1996). Ms. Tedesco has a current medication list which includes the following prescription(s): albuterol, aspirin, b-d ins syr ultrafine 1cc/30g, fifty50 glucose meter 2.0, bupropion, clotrimazole, enalapril, ergocalciferol, furosemide, gabapentin, blood glucose test strips, glucose blood, insulin nph-regular human, insulin regular, accu-chek multiclix, mirtazapine, montelukast, naloxone, oxybutynin, tizanidine, vitamin b-12, [START ON 02/07/2020] hydrocodone-acetaminophen, and [START ON 02/07/2020] morphine. She  reports that she has quit smoking. She has never used smokeless tobacco. She reports that she does not drink alcohol and does not use drugs. Ms. Minkler is allergic to amoxicillin-pot clavulanate, copaxone  [glatiramer acetate], pseudoephedrine, rofecoxib, sulfa antibiotics, sulfamethoxazole-trimethoprim, and meloxicam.   HPI  Today, she is being contacted for medication management. The patient indicates doing well with the current medication regimen. No adverse reactions or side effects reported to the medications.  The patient indicates that she had to switch her appointment not only because of the road conditions, but because she also tested positive for COVID 19.  She had had the 2 vaccines and the booster, which means that she should be protected against  the serious complications of the COVID infection.  She was reminded that these virtual visits cannot become a norm when he comes to  the management of controlled substances since it is required that we do face-to-face evaluations for the purpose of doing unannounced UDS testing and pill counts among other monitoring requirements.  She understood and accepted.  RTCB: 03/08/2020  Pharmacotherapy Assessment  Analgesic: Morphine ER 30 mg, 1 tab PO q 8 hrs (90 mg/day) + hydrocodone/APAP 5/325, 1 tab PO q 6 hrs (20 mg/dayof hydrocodone) MME/day:169m/day.   Monitoring:  PMP: PDMP reviewed during this encounter.       Pharmacotherapy: No side-effects or adverse reactions reported. Compliance: No problems identified. Effectiveness: Clinically acceptable. Plan: Refer to "POC".  UDS:  Summary  Date Value Ref Range Status  05/13/2019 Note  Final    Comment:    ==================================================================== [facility] 13 (MW) ==================================================================== Test                             Result       Flag       Units Drug Present and Declared for Prescription Verification   Morphine                       >7576        EXPECTED   ng/mg creat   Normorphine                    366          EXPECTED   ng/mg creat    Potential sources of large amounts of morphine in the absence of    codeine include administration of morphine or use of heroin.    Normorphine is an expected metabolite of morphine.   Hydrocodone                    1877         EXPECTED   ng/mg creat   Hydromorphone                  214          EXPECTED   ng/mg creat   Dihydrocodeine                 83           EXPECTED   ng/mg creat   Norhydrocodone                 1636         EXPECTED   ng/mg creat    Sources of hydrocodone include scheduled prescription medications.    Hydromorphone, dihydrocodeine and norhydrocodone are expected    metabolites of hydrocodone.  Hydromorphone and dihydrocodeine are    also available as scheduled prescription medications. ==================================================================== Test                      Result    Flag   Units      Ref Range   Creatinine              132              mg/dL      >=20 ==================================================================== Declared Medications:  The flagging and interpretation on this report are based on the  following declared medications.  Unexpected results may arise from  inaccuracies in the declared medications.  **Note: The testing scope of this panel includes these medications:  Hydrocodone (Norco)  Morphine (MS Contin)  **Note: The testing scope of this  panel does not include the  following reported medications:  Acetaminophen (Norco)  Albuterol (Ventolin HFA)  Aspirin  Bupropion (Wellbutrin)  Clotrimazole (Mycelex)  Cyanocobalamin  Enalapril (Vasotec)  Furosemide (Lasix)  Gabapentin (Neurontin)  Insulin  Mirtazapine (Remeron)  Montelukast (Singulair)  Oxybutynin (Ditropan)  Tizanidine (Zanaflex)  Triamcinolone (Kenalog) ==================================================================== For clinical consultation, please call [phone]. ====================================================================     Laboratory Chemistry Profile   Renal Lab Results  Component Value Date   BUN 18 02/06/2018   CREATININE 1.22 (H) 02/06/2018   BCR 15 02/06/2018   GFRAA 56 (L) 02/06/2018   GFRNONAA 49 (L) 02/06/2018     Hepatic Lab Results  Component Value Date   AST 25 02/06/2018   ALT 19 02/15/2015   ALBUMIN 4.6 02/06/2018   ALKPHOS 97 02/06/2018     Electrolytes Lab Results  Component Value Date   NA 136 02/06/2018   K 4.9 02/06/2018   CL 95 (L) 02/06/2018   CALCIUM 9.8 02/06/2018   MG 1.8 02/06/2018     Bone Lab Results  Component Value Date   25OHVITD1 55 02/06/2018   25OHVITD2 2.9 02/06/2018   25OHVITD3 52  02/06/2018     Inflammation (CRP: Acute Phase) (ESR: Chronic Phase) Lab Results  Component Value Date   CRP 17 (H) 02/06/2018   ESRSEDRATE 28 02/06/2018       Note: Above Lab results reviewed.  Imaging  US Abdomen Complete * PRIOR REPORT IMPORTED FROM AN EXTERNAL SYSTEM *   PRIOR REPORT IMPORTED FROM THE [employer] WORKFLOW SYSTEM   REASON FOR EXAM:    Enlarged Liver Spleen  COMMENTS:   PROCEDURE:     [place]  - US ABDOMEN GENERAL SURVEY  - Jul 29 2012 10:20AM   RESULT:     The liver is enlarged. It demonstrates increased echo texture  consistent with fatty infiltration. Portal venous flow is normal in  direction toward the liver. There is splenomegaly with maximal dimension  of  the spleen of 16.2 cm.   The gallbladder is adequately distended with no evidence of stones, wall  thickening, or pericholecystic fluid. There is no positive sonographic  Murphy's sign. The common bile duct measures 5.5 mm in diameter.   Evaluation the pancreas is limited by bowel gas. Similarly the abdominal  aorta is only partially visualized. The inferior vena cava and kidneys are  normal in appearance.   IMPRESSION:  1. There is hepatosplenomegaly.  2. The gallbladder and common bile duct and observed portions of the  pancreas appear normal.  3. The kidneys exhibit no evidence of obstruction nor other acute  abnormality.   Dictation Site: 2     Assessment  The primary encounter diagnosis was Chronic pain syndrome. 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  Plan of Care  Problem-specific:  No problem-specific Assessment & Plan notes found for this encounter.  Ms. Makylie Rivere has a current medication list  which includes the following long-term medication(s): albuterol, bupropion, furosemide, gabapentin, insulin nph-regular human, insulin regular, mirtazapine, montelukast, [START ON 02/07/2020] hydrocodone-acetaminophen, and [START ON 02/07/2020] morphine.  Pharmacotherapy (Medications Ordered): Meds ordered this encounter  Medications  . HYDROcodone-acetaminophen (NORCO/VICODIN) 5-325 MG tablet    Sig: Take 1 tablet by mouth every 6 (six) hours as needed for severe pain. Must  last 30 days    Dispense:  120 tablet    Refill:  0    Chronic Pain: STOP Act (Not applicable) Fill 1 day early if closed on refill date. Avoid benzodiazepines within 8 hours of opioids  . morphine (MS CONTIN) 30 MG 12 hr tablet    Sig: Take 1 tablet (30 mg total) by mouth every 8 (eight) hours as needed for pain. Must last 30 days    Dispense:  90 tablet    Refill:  0    Chronic Pain: STOP Act (Not applicable) Fill 1 day early if closed on refill date. Avoid benzodiazepines within 8 hours of opioids   Orders:  No orders of the defined types were placed in this encounter.  Follow-up plan:   Return in about 6 weeks (around 03/08/2020) for (F2F), (Med Mgmt).      Interventional therapies: Planned, scheduled, and/or pending:   Not at this time.   Considering:   None at this time.    Palliative PRN treatment(s):   None at this time.       Recent Visits No visits were found meeting these conditions. Showing recent visits within past 90 days and meeting all other requirements Today's Visits Date Type Provider Dept  01/28/20 Telemedicine Milinda Pointer, MD [facility]  Showing today's visits and meeting all other requirements Future Appointments No visits were found meeting these conditions. Showing future appointments within next 90 days and meeting all other requirements  I discussed the assessment and treatment plan with the patient. The patient was provided an opportunity to ask questions and  all were answered. The patient agreed with the plan and demonstrated an understanding of the instructions.  Patient advised to call back or seek an in-person evaluation if the symptoms or condition worsens.  Duration of encounter: 14 minutes.  Note by: Gaspar Cola, MD Date: 01/28/2020; Time: 10:08 AM

## 2020-01-28 ENCOUNTER — Ambulatory Visit: Payer: Medicare HMO | Attending: Pain Medicine | Admitting: Pain Medicine

## 2020-01-28 ENCOUNTER — Other Ambulatory Visit: Payer: Self-pay

## 2020-01-28 ENCOUNTER — Encounter: Payer: Self-pay | Admitting: Pain Medicine

## 2020-01-28 DIAGNOSIS — M961 Postlaminectomy syndrome, not elsewhere classified: Secondary | ICD-10-CM

## 2020-01-28 DIAGNOSIS — M4310 Spondylolisthesis, site unspecified: Secondary | ICD-10-CM

## 2020-01-28 DIAGNOSIS — G8929 Other chronic pain: Secondary | ICD-10-CM

## 2020-01-28 DIAGNOSIS — G35 Multiple sclerosis: Secondary | ICD-10-CM

## 2020-01-28 DIAGNOSIS — G894 Chronic pain syndrome: Secondary | ICD-10-CM

## 2020-01-28 DIAGNOSIS — F1120 Opioid dependence, uncomplicated: Secondary | ICD-10-CM

## 2020-01-28 DIAGNOSIS — Z79899 Other long term (current) drug therapy: Secondary | ICD-10-CM

## 2020-01-28 DIAGNOSIS — M5441 Lumbago with sciatica, right side: Secondary | ICD-10-CM

## 2020-01-28 DIAGNOSIS — M47816 Spondylosis without myelopathy or radiculopathy, lumbar region: Secondary | ICD-10-CM

## 2020-01-28 DIAGNOSIS — M79604 Pain in right leg: Secondary | ICD-10-CM | POA: Diagnosis not present

## 2020-01-28 MED ORDER — HYDROCODONE-ACETAMINOPHEN 5-325 MG PO TABS: 1.0000 | ORAL_TABLET | Freq: Four times a day (QID) | ORAL | 0 refills | Status: DC | PRN

## 2020-01-28 MED ORDER — MORPHINE SULFATE ER 30 MG PO TBCR: 30.0000 mg | EXTENDED_RELEASE_TABLET | Freq: Three times a day (TID) | ORAL | 0 refills | Status: DC | PRN

## 2020-03-07 NOTE — Progress Notes (Signed)
PROVIDER NOTE: Information contained herein reflects review and annotations entered in association with encounter. Interpretation of such information and data should be left to medically-trained personnel. Information provided to patient can be located elsewhere in the medical record under "Patient Instructions". Document created using STT-dictation technology, any transcriptional errors that may result from process are unintentional.    Patient: Tiffany Herring  Service Category: E/M  Provider: Gaspar Cola, MD  DOB: 21-Jan-1960  DOS: 03/08/2020  Specialty: Interventional Pain Management  MRN: [id number]  Setting: Ambulatory outpatient  PCP: Hortencia Pilar, MD  Type: Established Patient    Referring Provider: Hortencia Pilar, MD  Location: Office  Delivery: Face-to-face     HPI  [name], a 60 y.o. year old female, is here today because of her Chronic pain syndrome [G89.4]. [name] primary complain today is Hip Pain (Left hip pain) Last encounter: My last encounter with her was on Visit date not found. Pertinent problems: [name] has Failed back surgical syndrome; Epidural fibrosis; Chronic low back pain (Bilateral) w/ sciatica (Right); Chronic lumbar radicular pain (Right); Generalized OA; Multiple sclerosis ([place]); Degenerative arthritis of hip; Fibromyalgia; Lumbar spondylosis; Grade 1 Anterolisthesis of L4/L5; Chronic sacroiliac joint pain (bilateral); Chronic hip pain (Left); Arthropathy of left hip (Severe chronic left hip DJD); Osteoarthritis of hip (Left); Lumbar facet arthropathy ([place]); Lumbar facet hypertrophy; Lumbar facet syndrome (Bilateral); Chronic flank pain (Right); Chronic lower extremity pain (Right); Chronic pain syndrome; Venous stasis ulcer ([place]); OA (osteoarthritis); Osteoarthrosis, hip; and Generalized osteoarthritis on their pertinent problem list. Pain Assessment: Severity of Chronic pain is reported as a 4 /10. Location: Hip Left/pain radiaties down  to herknee. Onset: More than a month ago. Quality: Constant,Throbbing,Aching. Timing: Constant. Modifying factor(s): meds. Vitals:  height is 5' 2"  (1.575 m) and weight is 320 lb (145.2 kg) (abnormal). Her temperature is 97.1 F (36.2 C) (abnormal). Her blood pressure is 142/65 (abnormal) and her pulse is 96. Her oxygen saturation is 100%.   Reason for encounter: medication management. The patient indicates doing well with the current medication regimen. No adverse reactions or side effects reported to the medications.    I took the opportunity to explain the concept of tolerance and how "Drug Holidays" help control it.  I detailed how to do a slow taper to avoid withdrawals.  The patient was initially seen in our practice on 02/06/2018, by that time she was already taking morphine ER 30 mg every 8 hours (90 mg/day) + hydrocodone/APAP 5/325 1 tab p.o. every 6 hours as needed for breakthrough pain (20 mg/day of hydrocodone) for a total MME of 110 mg/day.  Since joining the practice, the patient has not done a "Drug Holiday" and therefore she still remains the same dose.  RTCB: 06/06/2020  Pharmacotherapy Assessment   Analgesic: Morphine ER 30 mg, 1 tab PO q 8 hrs (90 mg/day) + hydrocodone/APAP 5/325, 1 tab PO q 6 hrs (20 mg/dayof hydrocodone) MME/day:179m/day.   Monitoring: [place] PMP: PDMP reviewed during this encounter.       Pharmacotherapy: No side-effects or adverse reactions reported. Compliance: No problems identified. Effectiveness: Clinically acceptable.  BChauncey Fischer RN  03/08/2020  1:23 PM  Sign when Signing Visit Nursing Pain Medication Assessment:  Safety precautions to be maintained throughout the outpatient stay will include: orient to surroundings, keep bed in low position, maintain call bell within reach at all times, provide assistance with transfer out of bed and ambulation.  Medication Inspection Compliance: Pill count conducted under aseptic  conditions, in front of the  patient. Neither the pills nor the bottle was removed from the patient's sight at any time. Once count was completed pills were immediately returned to the patient in their original bottle.  Medication #1: Hydrocodone/APAP Pill/Patch Count: 11 of 120 pills remain Pill/Patch Appearance: Markings consistent with prescribed medication Bottle Appearance: Standard pharmacy container. Clearly labeled. Filled Date: 1 / 72 / 22 Last Medication intake:  Today  Medication #2: Morphine ER (MSContin) Pill/Patch Count: 8 of 90 pills remain Pill/Patch Appearance: Markings consistent with prescribed medication Bottle Appearance: Standard pharmacy container. Clearly labeled. Filled Date: 1 / 52 / 22 Last Medication intake:  TodaySafety precautions to be maintained throughout the outpatient stay will include: orient to surroundings, keep bed in low position, maintain call bell within reach at all times, provide assistance with transfer out of bed and ambulation.     UDS:  Summary  Date Value Ref Range Status  05/13/2019 Note  Final    Comment:    ==================================================================== [employer] [facility] 13 (MW) ==================================================================== Test                             Result       Flag       Units Drug Present and Declared for Prescription Verification   Morphine                       >7576        EXPECTED   ng/mg creat   Normorphine                    366          EXPECTED   ng/mg creat    Potential sources of large amounts of morphine in the absence of    codeine include administration of morphine or use of heroin.    Normorphine is an expected metabolite of morphine.   Hydrocodone                    1877         EXPECTED   ng/mg creat   Hydromorphone                  214          EXPECTED   ng/mg creat   Dihydrocodeine                 83           EXPECTED   ng/mg creat   Norhydrocodone                 1636         EXPECTED    ng/mg creat    Sources of hydrocodone include scheduled prescription medications.    Hydromorphone, dihydrocodeine and norhydrocodone are expected    metabolites of hydrocodone. Hydromorphone and dihydrocodeine are    also available as scheduled prescription medications. ==================================================================== Test                      Result    Flag   Units      Ref Range   Creatinine              132              mg/dL      >=20 ==================================================================== Declared Medications:  The flagging  and interpretation on this report are based on the  following declared medications.  Unexpected results may arise from  inaccuracies in the declared medications.  **Note: The testing scope of this panel includes these medications:  Hydrocodone (Norco)  Morphine (MS Contin)  **Note: The testing scope of this panel does not include the  following reported medications:  Acetaminophen (Norco)  Albuterol (Ventolin HFA)  Aspirin  Bupropion (Wellbutrin)  Clotrimazole (Mycelex)  Cyanocobalamin  Enalapril (Vasotec)  Furosemide (Lasix)  Gabapentin (Neurontin)  Insulin  Mirtazapine (Remeron)  Montelukast (Singulair)  Oxybutynin (Ditropan)  Tizanidine (Zanaflex)  Triamcinolone (Kenalog) ==================================================================== For clinical consultation, please call [phone]. ====================================================================      ROS  Constitutional: Denies any fever or chills Gastrointestinal: No reported hemesis, hematochezia, vomiting, or acute GI distress Musculoskeletal: Denies any acute onset joint swelling, redness, loss of ROM, or weakness Neurological: No reported episodes of acute onset apraxia, aphasia, dysarthria, agnosia, amnesia, paralysis, loss of coordination, or loss of consciousness  Medication Review  BLOOD GLUCOSE TEST STRIPS, Fifty50 Glucose Meter 2.0,  HYDROcodone-acetaminophen, Insulin Syringe-Needle U-100, accu-chek multiclix, albuterol, aspirin, buPROPion, clotrimazole, enalapril, ergocalciferol, furosemide, gabapentin, glucose blood, insulin NPH-regular Human, insulin regular, mirtazapine, montelukast, morphine, naloxone, oxybutynin, tiZANidine, and vitamin B-12  History Review  Allergy: [name] is allergic to amoxicillin-pot clavulanate, copaxone  [glatiramer acetate], pseudoephedrine, rofecoxib, sulfa antibiotics, sulfamethoxazole-trimethoprim, and meloxicam. Drug: [name]  reports no history of drug use. Alcohol:  reports no history of alcohol use. Tobacco:  reports that she has quit smoking. She has never used smokeless tobacco. Social: [name]  reports that she has quit smoking. She has never used smokeless tobacco. She reports that she does not drink alcohol and does not use drugs. Medical:  has a past medical history of Allergy, Arthritis, Asthma, Chronic kidney disease, Depression, Diabetes mellitus without complication ([place]), Hypertension, Multiple sclerosis ([place]), Neuromuscular disorder ([place]), and Sleep apnea. Surgical: [name]  has a past surgical history that includes Cesarean section (1991 and 1996). Family: family history includes Dementia in her mother; Depression in her mother; Diabetes in her mother; Hyperlipidemia in her mother; Stroke in her father.  Laboratory Chemistry Profile   Renal Lab Results  Component Value Date   BUN 18 02/06/2018   CREATININE 1.22 (H) 02/06/2018   BCR 15 02/06/2018   GFRAA 56 (L) 02/06/2018   GFRNONAA 49 (L) 02/06/2018     Hepatic Lab Results  Component Value Date   AST 25 02/06/2018   ALT 19 02/15/2015   ALBUMIN 4.6 02/06/2018   ALKPHOS 97 02/06/2018     Electrolytes Lab Results  Component Value Date   NA 136 02/06/2018   K 4.9 02/06/2018   CL 95 (L) 02/06/2018   CALCIUM 9.8 02/06/2018   MG 1.8 02/06/2018     Bone Lab Results  Component Value Date    25OHVITD1 55 02/06/2018   25OHVITD2 2.9 02/06/2018   25OHVITD3 52 02/06/2018     Inflammation (CRP: Acute Phase) (ESR: Chronic Phase) Lab Results  Component Value Date   CRP 17 (H) 02/06/2018   ESRSEDRATE 28 02/06/2018       Note: Above Lab results reviewed.  Recent Imaging Review  US Abdomen Complete * PRIOR REPORT IMPORTED FROM AN EXTERNAL SYSTEM *   PRIOR REPORT IMPORTED FROM THE [facility] WORKFLOW SYSTEM   REASON FOR EXAM:    Enlarged Liver Spleen  COMMENTS:   PROCEDURE:     [place]  - US ABDOMEN GENERAL SURVEY  - Jul 29 2012 10:20AM  RESULT:     The liver is enlarged. It demonstrates increased echo texture  consistent with fatty infiltration. Portal venous flow is normal in  direction toward the liver. There is splenomegaly with maximal dimension  of  the spleen of 16.2 cm.   The gallbladder is adequately distended with no evidence of stones, wall  thickening, or pericholecystic fluid. There is no positive sonographic  Murphy's sign. The common bile duct measures 5.5 mm in diameter.   Evaluation the pancreas is limited by bowel gas. Similarly the abdominal  aorta is only partially visualized. The inferior vena cava and kidneys are  normal in appearance.   IMPRESSION:  1. There is hepatosplenomegaly.  2. The gallbladder and common bile duct and observed portions of the  pancreas appear normal.  3. The kidneys exhibit no evidence of obstruction nor other acute  abnormality.   Dictation Site: 2    Note: Reviewed        Physical Exam  General appearance: Well nourished, well developed, and well hydrated. In no apparent acute distress Mental status: Alert, oriented x 3 (person, place, & time)       Respiratory: No evidence of acute respiratory distress Eyes: PERLA Vitals: BP (!) 142/65   Pulse 96   Temp (!) 97.1 F (36.2 C)   Ht 5' 2"  (1.575 m)   Wt (!) 320 lb (145.2 kg)   SpO2 100%   BMI 58.53 kg/m  BMI: Estimated body mass index is 58.53 kg/m as calculated  from the following:   Height as of this encounter: 5' 2"  (1.575 m).   Weight as of this encounter: 320 lb (145.2 kg). Ideal: Ideal body weight: 50.1 kg (110 lb 7.2 oz) Adjusted ideal body weight: 88.1 kg (194 lb 4.3 oz)  Assessment   Status Diagnosis  Controlled Controlled Controlled 1. Chronic pain syndrome   2. Chronic low back pain (Bilateral) w/ sciatica (Right)   3. Chronic lower extremity pain (Right)   4. Multiple sclerosis ([place])   5. Failed back surgical syndrome   6. Lumbar facet syndrome (Bilateral)   7. Pharmacologic therapy   8. Uncomplicated opioid dependence ([place])      Updated Problems: No problems updated.  Plan of Care  Problem-specific:  No problem-specific Assessment & Plan notes found for this encounter.  [name] has a current medication list which includes the following long-term medication(s): albuterol, bupropion, furosemide, gabapentin, hydrocodone-acetaminophen, [START ON 04/07/2020] hydrocodone-acetaminophen, [START ON 05/07/2020] hydrocodone-acetaminophen, insulin nph-regular human, insulin regular, mirtazapine, montelukast, morphine, [START ON 04/07/2020] morphine, and [START ON 05/07/2020] morphine.  Pharmacotherapy (Medications Ordered): Meds ordered this encounter  Medications  . HYDROcodone-acetaminophen (NORCO/VICODIN) 5-325 MG tablet    Sig: Take 1 tablet by mouth every 6 (six) hours as needed for severe pain. Must last 30 days    Dispense:  120 tablet    Refill:  0    Chronic Pain: STOP Act (Not applicable) Fill 1 day early if closed on refill date. Avoid benzodiazepines within 8 hours of opioids  . morphine (MS CONTIN) 30 MG 12 hr tablet    Sig: Take 1 tablet (30 mg total) by mouth every 8 (eight) hours as needed for pain. Must last 30 days    Dispense:  90 tablet    Refill:  0    Chronic Pain: STOP Act (Not applicable) Fill 1 day early if closed on refill date. Avoid benzodiazepines within 8 hours of opioids  .  HYDROcodone-acetaminophen (NORCO/VICODIN) 5-325 MG tablet  Sig: Take 1 tablet by mouth every 6 (six) hours as needed for severe pain. Must last 30 days    Dispense:  120 tablet    Refill:  0    Chronic Pain: STOP Act (Not applicable) Fill 1 day early if closed on refill date. Avoid benzodiazepines within 8 hours of opioids  . HYDROcodone-acetaminophen (NORCO/VICODIN) 5-325 MG tablet    Sig: Take 1 tablet by mouth every 6 (six) hours as needed for severe pain. Must last 30 days    Dispense:  120 tablet    Refill:  0    Chronic Pain: STOP Act (Not applicable) Fill 1 day early if closed on refill date. Avoid benzodiazepines within 8 hours of opioids  . morphine (MS CONTIN) 30 MG 12 hr tablet    Sig: Take 1 tablet (30 mg total) by mouth every 8 (eight) hours as needed for pain. Must last 30 days    Dispense:  90 tablet    Refill:  0    Chronic Pain: STOP Act (Not applicable) Fill 1 day early if closed on refill date. Avoid benzodiazepines within 8 hours of opioids  . morphine (MS CONTIN) 30 MG 12 hr tablet    Sig: Take 1 tablet (30 mg total) by mouth every 8 (eight) hours as needed for pain. Must last 30 days    Dispense:  90 tablet    Refill:  0    Chronic Pain: STOP Act (Not applicable) Fill 1 day early if closed on refill date. Avoid benzodiazepines within 8 hours of opioids   Orders:  No orders of the defined types were placed in this encounter.  Follow-up plan:   Return in about 3 months (around 06/06/2020) for (F2F), (Med Mgmt).      Interventional therapies: Planned, scheduled, and/or pending:   Not at this time.   Considering:   None at this time.    Palliative PRN treatment(s):   None at this time.        Recent Visits Date Type Provider Dept  01/28/20 Telemedicine Milinda Pointer, MD [facility]  Showing recent visits within past 90 days and meeting all other requirements Today's Visits Date Type Provider Dept  03/08/20 Office Visit Milinda Pointer,  MD [facility]  Showing today's visits and meeting all other requirements Future Appointments No visits were found meeting these conditions. Showing future appointments within next 90 days and meeting all other requirements  I discussed the assessment and treatment plan with the patient. The patient was provided an opportunity to ask questions and all were answered. The patient agreed with the plan and demonstrated an understanding of the instructions.  Patient advised to call back or seek an in-person evaluation if the symptoms or condition worsens.  Duration of encounter: 30 minutes.  Note by: Tiffany Cola, MD Date: 03/08/2020; Time: 2:15 PM

## 2020-03-08 ENCOUNTER — Other Ambulatory Visit: Payer: Self-pay

## 2020-03-08 ENCOUNTER — Ambulatory Visit: Payer: Medicare HMO | Attending: Pain Medicine | Admitting: Pain Medicine

## 2020-03-08 ENCOUNTER — Encounter: Payer: Self-pay | Admitting: Pain Medicine

## 2020-03-08 VITALS — BP 142/65 | HR 96 | Temp 97.1°F | Ht 62.0 in | Wt 320.0 lb

## 2020-03-08 DIAGNOSIS — G35 Multiple sclerosis: Secondary | ICD-10-CM | POA: Diagnosis not present

## 2020-03-08 DIAGNOSIS — M47816 Spondylosis without myelopathy or radiculopathy, lumbar region: Secondary | ICD-10-CM | POA: Diagnosis present

## 2020-03-08 DIAGNOSIS — M79604 Pain in right leg: Secondary | ICD-10-CM | POA: Insufficient documentation

## 2020-03-08 DIAGNOSIS — G894 Chronic pain syndrome: Secondary | ICD-10-CM

## 2020-03-08 DIAGNOSIS — Z79899 Other long term (current) drug therapy: Secondary | ICD-10-CM

## 2020-03-08 DIAGNOSIS — G8929 Other chronic pain: Secondary | ICD-10-CM

## 2020-03-08 DIAGNOSIS — F1120 Opioid dependence, uncomplicated: Secondary | ICD-10-CM | POA: Diagnosis present

## 2020-03-08 DIAGNOSIS — M5441 Lumbago with sciatica, right side: Secondary | ICD-10-CM | POA: Diagnosis not present

## 2020-03-08 DIAGNOSIS — M961 Postlaminectomy syndrome, not elsewhere classified: Secondary | ICD-10-CM | POA: Diagnosis present

## 2020-03-08 MED ORDER — HYDROCODONE-ACETAMINOPHEN 5-325 MG PO TABS: 1.0000 | ORAL_TABLET | Freq: Four times a day (QID) | ORAL | 0 refills | Status: DC | PRN

## 2020-03-08 MED ORDER — MORPHINE SULFATE ER 30 MG PO TBCR: 30.0000 mg | EXTENDED_RELEASE_TABLET | Freq: Three times a day (TID) | ORAL | 0 refills | Status: DC | PRN

## 2020-03-08 NOTE — Patient Instructions (Signed)
____________________________________________________________________________________________  Drug Holidays (Slow)  What is a "Drug Holiday"? Drug Holiday: is the name given to the period of time during which a patient stops taking a medication(s) for the purpose of eliminating tolerance to the drug.  Benefits . Improved effectiveness of opioids. . Decreased opioid dose needed to achieve benefits. . Improved pain with lesser dose.  What is tolerance? Tolerance: is the progressive decreased in effectiveness of a drug due to its repetitive use. With repetitive use, the body gets use to the medication and as a consequence, it loses its effectiveness. This is a common problem seen with opioid pain medications. As a result, a larger dose of the drug is needed to achieve the same effect that used to be obtained with a smaller dose.  How long should a "Drug Holiday" last? You should stay off of the pain medicine for at least 14 consecutive days. (2 weeks)  Should I stop the medicine "cold turkey"? No. You should always coordinate with your Pain Specialist so that he/she can provide you with the correct medication dose to make the transition as smoothly as possible.  How do I stop the medicine? Slowly. You will be instructed to decrease the daily amount of pills that you take by one (1) pill every seven (7) days. This is called a "slow downward taper" of your dose. For example: if you normally take four (4) pills per day, you will be asked to drop this dose to three (3) pills per day for seven (7) days, then to two (2) pills per day for seven (7) days, then to one (1) per day for seven (7) days, and at the end of those last seven (7) days, this is when the "Drug Holiday" would start.   Will I have withdrawals? By doing a "slow downward taper" like this one, it is unlikely that you will experience any significant withdrawal symptoms. Typically, what triggers withdrawals is the sudden stop of a high  dose opioid therapy. Withdrawals can usually be avoided by slowly decreasing the dose over a prolonged period of time. If you do not follow these instructions and decide to stop your medication abruptly, withdrawals may be possible.  What are withdrawals? Withdrawals: refers to the wide range of symptoms that occur after stopping or dramatically reducing opiate drugs after heavy and prolonged use. Withdrawal symptoms do not occur to patients that use low dose opioids, or those who take the medication sporadically. Contrary to benzodiazepine (example: Valium, Xanax, etc.) or alcohol withdrawals ("Delirium Tremens"), opioid withdrawals are not lethal. Withdrawals are the physical manifestation of the body getting rid of the excess receptors.  Expected Symptoms Early symptoms of withdrawal may include: . Agitation . Anxiety . Muscle aches . Increased tearing . Insomnia . Runny nose . Sweating . Yawning  Late symptoms of withdrawal may include: . Abdominal cramping . Diarrhea . Dilated pupils . Goose bumps . Nausea . Vomiting  Will I experience withdrawals? Due to the slow nature of the taper, it is very unlikely that you will experience any.  What is a slow taper? Taper: refers to the gradual decrease in dose.  (Last update: 07/30/2019) ____________________________________________________________________________________________    ____________________________________________________________________________________________  Medication Rules  Purpose: To inform patients, and their family members, of our rules and regulations.  Applies to: All patients receiving prescriptions (written or electronic).  Pharmacy of record: Pharmacy where electronic prescriptions will be sent. If written prescriptions are taken to a different pharmacy, please inform the nursing staff. The pharmacy   listed in the electronic medical record should be the one where you would like electronic prescriptions  to be sent.  Electronic prescriptions: In compliance with the [place] Strengthen Opioid Misuse Prevention (STOP) Act of 2017 (Session Law 2017-74/H243), effective January 09, 2018, all controlled substances must be electronically prescribed. Calling prescriptions to the pharmacy will cease to exist.  Prescription refills: Only during scheduled appointments. Applies to all prescriptions.  NOTE: The following applies primarily to controlled substances (Opioid* Pain Medications).   Type of encounter (visit): For patients receiving controlled substances, face-to-face visits are required. (Not an option or up to the patient.)  Patient's responsibilities: 1. Pain Pills: Bring all pain pills to every appointment (except for procedure appointments). 2. Pill Bottles: Bring pills in original pharmacy bottle. Always bring the newest bottle. Bring bottle, even if empty. 3. Medication refills: You are responsible for knowing and keeping track of what medications you take and those you need refilled. The day before your appointment: write a list of all prescriptions that need to be refilled. The day of the appointment: give the list to the admitting nurse. Prescriptions will be written only during appointments. No prescriptions will be written on procedure days. If you forget a medication: it will not be "Called in", "Faxed", or "electronically sent". You will need to get another appointment to get these prescribed. No early refills. Do not call asking to have your prescription filled early. 4. Prescription Accuracy: You are responsible for carefully inspecting your prescriptions before leaving our office. Have the discharge nurse carefully go over each prescription with you, before taking them home. Make sure that your name is accurately spelled, that your address is correct. Check the name and dose of your medication to make sure it is accurate. Check the number of pills, and the written instructions to  make sure they are clear and accurate. Make sure that you are given enough medication to last until your next medication refill appointment. 5. Taking Medication: Take medication as prescribed. When it comes to controlled substances, taking less pills or less frequently than prescribed is permitted and encouraged. Never take more pills than instructed. Never take medication more frequently than prescribed.  6. Inform other Doctors: Always inform, all of your healthcare providers, of all the medications you take. 7. Pain Medication from other Providers: You are not allowed to accept any additional pain medication from any other Doctor or Healthcare provider. There are two exceptions to this rule. (see below) In the event that you require additional pain medication, you are responsible for notifying us, as stated below. 8. Cough Medicine: Often these contain an opioid, such as codeine or hydrocodone. Never accept or take cough medicine containing these opioids if you are already taking an opioid* medication. The combination may cause respiratory failure and death. 9. Medication Agreement: You are responsible for carefully reading and following our Medication Agreement. This must be signed before receiving any prescriptions from our practice. Safely store a copy of your signed Agreement. Violations to the Agreement will result in no further prescriptions. (Additional copies of our Medication Agreement are available upon request.) 10. Laws, Rules, & Regulations: All patients are expected to follow all Federal and State Laws, Statutes, Rules, & Regulations. Ignorance of the Laws does not constitute a valid excuse.  11. Illegal drugs and Controlled Substances: The use of illegal substances (including, but not limited to marijuana and its derivatives) and/or the illegal use of any controlled substances is strictly prohibited. Violation of this rule may   result in the immediate and permanent discontinuation of any  and all prescriptions being written by our practice. The use of any illegal substances is prohibited. 12. Adopted CDC guidelines & recommendations: Target dosing levels will be at or below 60 MME/day. Use of benzodiazepines** is not recommended.  Exceptions: There are only two exceptions to the rule of not receiving pain medications from other Healthcare Providers. 1. Exception #1 (Emergencies): In the event of an emergency (i.e.: accident requiring emergency care), you are allowed to receive additional pain medication. However, you are responsible for: As soon as you are able, call our office [phone], at any time of the day or night, and leave a message stating your name, the date and nature of the emergency, and the name and dose of the medication prescribed. In the event that your call is answered by a member of our staff, make sure to document and save the date, time, and the name of the person that took your information.  2. Exception #2 (Planned Surgery): In the event that you are scheduled by another doctor or dentist to have any type of surgery or procedure, you are allowed (for a period no longer than 30 days), to receive additional pain medication, for the acute post-op pain. However, in this case, you are responsible for picking up a copy of our "Post-op Pain Management for Surgeons" handout, and giving it to your surgeon or dentist. This document is available at our office, and does not require an appointment to obtain it. Simply go to our office during business hours (Monday-Thursday from 8:00 AM to 4:00 PM) (Friday 8:00 AM to 12:00 Noon) or if you have a scheduled appointment with us, prior to your surgery, and ask for it by name. In addition, you are responsible for: calling our office [phone], at any time of the day or night, and leaving a message stating your name, name of your surgeon, type of surgery, and date of procedure or surgery. Failure to comply with your responsibilities  may result in termination of therapy involving the controlled substances.  *Opioid medications include: morphine, codeine, oxycodone, oxymorphone, hydrocodone, hydromorphone, meperidine, tramadol, tapentadol, buprenorphine, fentanyl, methadone. **Benzodiazepine medications include: diazepam (Valium), alprazolam (Xanax), clonazepam (Klonopine), lorazepam (Ativan), clorazepate (Tranxene), chlordiazepoxide (Librium), estazolam (Prosom), oxazepam (Serax), temazepam (Restoril), triazolam (Halcion) (Last updated: 12/08/2019) ____________________________________________________________________________________________   ____________________________________________________________________________________________  Medication Recommendations and Reminders  Applies to: All patients receiving prescriptions (written and/or electronic).  Medication Rules & Regulations: These rules and regulations exist for your safety and that of others. They are not flexible and neither are we. Dismissing or ignoring them will be considered "non-compliance" with medication therapy, resulting in complete and irreversible termination of such therapy. (See document titled "Medication Rules" for more details.) In all conscience, because of safety reasons, we cannot continue providing a therapy where the patient does not follow instructions.  Pharmacy of record:   Definition: This is the pharmacy where your electronic prescriptions will be sent.   We do not endorse any particular pharmacy, however, we have experienced problems with [facility] not securing enough medication supply for the community.  We do not restrict you in your choice of pharmacy. However, once we write for your prescriptions, we will NOT be re-sending more prescriptions to fix restricted supply problems created by your pharmacy, or your insurance.   The pharmacy listed in the electronic medical record should be the one where you want electronic prescriptions  to be sent.  If you choose to change pharmacy,   simply notify our nursing staff.  Recommendations:  Keep all of your pain medications in a safe place, under lock and key, even if you live alone. We will NOT replace lost, stolen, or damaged medication.  After you fill your prescription, take 1 week's worth of pills and put them away in a safe place. You should keep a separate, properly labeled bottle for this purpose. The remainder should be kept in the original bottle. Use this as your primary supply, until it runs out. Once it's gone, then you know that you have 1 week's worth of medicine, and it is time to come in for a prescription refill. If you do this correctly, it is unlikely that you will ever run out of medicine.  To make sure that the above recommendation works, it is very important that you make sure your medication refill appointments are scheduled at least 1 week before you run out of medicine. To do this in an effective manner, make sure that you do not leave the office without scheduling your next medication management appointment. Always ask the nursing staff to show you in your prescription , when your medication will be running out. Then arrange for the receptionist to get you a return appointment, at least 7 days before you run out of medicine. Do not wait until you have 1 or 2 pills left, to come in. This is very poor planning and does not take into consideration that we may need to cancel appointments due to bad weather, sickness, or emergencies affecting our staff.  DO NOT ACCEPT A "Partial Fill": If for any reason your pharmacy does not have enough pills/tablets to completely fill or refill your prescription, do not allow for a "partial fill". The law allows the pharmacy to complete that prescription within 72 hours, without requiring a new prescription. If they do not fill the rest of your prescription within those 72 hours, you will need a separate prescription to fill the  remaining amount, which we will NOT provide. If the reason for the partial fill is your insurance, you will need to talk to the pharmacist about payment alternatives for the remaining tablets, but again, DO NOT ACCEPT A PARTIAL FILL, unless you can trust your pharmacist to obtain the remainder of the pills within 72 hours.  Prescription refills and/or changes in medication(s):   Prescription refills, and/or changes in dose or medication, will be conducted only during scheduled medication management appointments. (Applies to both, written and electronic prescriptions.)  No refills on procedure days. No medication will be changed or started on procedure days. No changes, adjustments, and/or refills will be conducted on a procedure day. Doing so will interfere with the diagnostic portion of the procedure.  No phone refills. No medications will be "called into the pharmacy".  No Fax refills.  No weekend refills.  No Holliday refills.  No after hours refills.  Remember:  Business hours are:  Monday to Thursday 8:00 AM to 4:00 PM Provider's Schedule: Erandy Mceachern, MD - Appointments are:  Medication management: Monday and Wednesday 8:00 AM to 4:00 PM Procedure day: Tuesday and Thursday 7:30 AM to 4:00 PM Bilal Lateef, MD - Appointments are:  Medication management: Tuesday and Thursday 8:00 AM to 4:00 PM Procedure day: Monday and Wednesday 7:30 AM to 4:00 PM (Last update: 07/30/2019) ____________________________________________________________________________________________   ____________________________________________________________________________________________  CBD (cannabidiol) WARNING  Applicable to: All individuals currently taking or considering taking CBD (cannabidiol) and, more important, all patients taking opioid analgesic controlled substances (pain   medication). (Example: oxycodone; oxymorphone; hydrocodone; hydromorphone; morphine; methadone; tramadol; tapentadol;  fentanyl; buprenorphine; butorphanol; dextromethorphan; meperidine; codeine; etc.)  Legal status: CBD remains a Schedule I drug prohibited for any use. CBD is illegal with one exception. In the [place], CBD has a limited Food and Drug Administration (FDA) approval for the treatment of two specific types of epilepsy disorders. Only one CBD product has been approved by the FDA for this purpose: "Epidiolex". FDA is aware that some companies are marketing products containing cannabis and cannabis-derived compounds in ways that violate the Federal Food, Drug and Cosmetic Act (FD&C Act) and that may put the health and safety of consumers at risk. The FDA, a Federal agency, has not enforced the CBD status since 2018.   Legality: Some manufacturers ship CBD products nationally, which is illegal. Often such products are sold online and are therefore available throughout the country. CBD is openly sold in head shops and health food stores in some states where such sales have not been explicitly legalized. Selling unapproved products with unsubstantiated therapeutic claims is not only a violation of the law, but also can put patients at risk, as these products have not been proven to be safe or effective. Federal illegality makes it difficult to conduct research on CBD.  Reference: "FDA Regulation of Cannabis and Cannabis-Derived Products, Including Cannabidiol (CBD)" - https://www.fda.gov/news-events/public-health-focus/fda-regulation-cannabis-and-cannabis-derived-products-including-cannabidiol-cbd  Warning: CBD is not FDA approved and has not undergo the same manufacturing controls as prescription drugs.  This means that the purity and safety of available CBD may be questionable. Most of the time, despite manufacturer's claims, it is contaminated with THC (delta-9-tetrahydrocannabinol - the chemical in marijuana responsible for the "HIGH").  When this is the case, the THC contaminant will trigger a positive  urine drug screen (UDS) test for Marijuana (carboxy-THC). Because a positive UDS for any illicit substance is a violation of our medication agreement, your opioid analgesics (pain medicine) may be permanently discontinued.  MORE ABOUT CBD  General Information: CBD  is a derivative of the Marijuana (cannabis sativa) plant discovered in 1940. It is one of the 113 identified substances found in Marijuana. It accounts for up to 40% of the plant's extract. As of 2018, preliminary clinical studies on CBD included research for the treatment of anxiety, movement disorders, and pain. CBD is available and consumed in multiple forms, including inhalation of smoke or vapor, as an aerosol spray, and by mouth. It may be supplied as an oil containing CBD, capsules, dried cannabis, or as a liquid solution. CBD is thought not to be as psychoactive as THC (delta-9-tetrahydrocannabinol - the chemical in marijuana responsible for the "HIGH"). Studies suggest that CBD may interact with different biological target receptors in the body, including cannabinoid and other neurotransmitter receptors. As of 2018 the mechanism of action for its biological effects has not been determined.  Side-effects  Adverse reactions: Dry mouth, diarrhea, decreased appetite, fatigue, drowsiness, malaise, weakness, sleep disturbances, and others.  Drug interactions: CBC may interact with other medications such as blood-thinners. (Last update: 08/16/2019) ____________________________________________________________________________________________    

## 2020-03-08 NOTE — Progress Notes (Signed)
Nursing Pain Medication Assessment:  Safety precautions to be maintained throughout the outpatient stay will include: orient to surroundings, keep bed in low position, maintain call bell within reach at all times, provide assistance with transfer out of bed and ambulation.  Medication Inspection Compliance: Pill count conducted under aseptic conditions, in front of the patient. Neither the pills nor the bottle was removed from the patient's sight at any time. Once count was completed pills were immediately returned to the patient in their original bottle.  Medication #1: Hydrocodone/APAP Pill/Patch Count: 11 of 120 pills remain Pill/Patch Appearance: Markings consistent with prescribed medication Bottle Appearance: Standard pharmacy container. Clearly labeled. Filled Date: 1 / 66 / 22 Last Medication intake:  Today  Medication #2: Morphine ER (MSContin) Pill/Patch Count: 8 of 90 pills remain Pill/Patch Appearance: Markings consistent with prescribed medication Bottle Appearance: Standard pharmacy container. Clearly labeled. Filled Date: 1 / 3 / 22 Last Medication intake:  TodaySafety precautions to be maintained throughout the outpatient stay will include: orient to surroundings, keep bed in low position, maintain call bell within reach at all times, provide assistance with transfer out of bed and ambulation.

## 2020-05-25 DIAGNOSIS — Z79891 Long term (current) use of opiate analgesic: Secondary | ICD-10-CM | POA: Insufficient documentation

## 2020-05-25 NOTE — Progress Notes (Signed)
PROVIDER NOTE: Information contained herein reflects review and annotations entered in association with encounter. Interpretation of such information and data should be left to medically-trained personnel. Information provided to patient can be located elsewhere in the medical record under "Patient Instructions". Document created using STT-dictation technology, any transcriptional errors that may result from process are unintentional.    Patient: Tiffany Herring  Service Category: E/M  Provider: Gaspar Cola, MD  DOB: May 08, 1960  DOS: 05/26/2020  Specialty: Interventional Pain Management  MRN: [id number]  Setting: Ambulatory outpatient  PCP: Hortencia Pilar, MD  Type: Established Patient    Referring Provider: Hortencia Pilar, MD  Location: Office  Delivery: Face-to-face     HPI  [name], a 60 y.o. year old female, is here today because of her Chronic pain syndrome [G89.4]. Ms. Salvador primary complain today is Multiple Sclerosis and Arthritis Last encounter: My last encounter with her was on 03/08/2020. Pertinent problems: Ms. Plass has Failed back surgical syndrome; Epidural fibrosis; Chronic low back pain (Bilateral) w/ sciatica (Right); Chronic lumbar radicular pain (Right); Generalized OA; Multiple sclerosis ([place]); Degenerative arthritis of hip; Fibromyalgia; Lumbar spondylosis; Grade 1 Anterolisthesis of L4/L5; Chronic sacroiliac joint pain (bilateral); Chronic hip pain (Left); Arthropathy of left hip (Severe chronic left hip DJD); Osteoarthritis of hip (Left); Lumbar facet arthropathy ([place]); Lumbar facet hypertrophy; Lumbar facet syndrome (Bilateral); Chronic flank pain (Right); Chronic lower extremity pain (Right); Chronic pain syndrome; Venous stasis ulcer ([place]); OA (osteoarthritis); Osteoarthrosis, hip; and Generalized osteoarthritis on their pertinent problem list. Pain Assessment: Severity of Chronic pain is reported as a 5 /10. Location:  (all over)  /all over due to MS  and arthritis. Onset: More than a month ago. Quality: Stabbing,Sharp,Constant. Timing: Constant. Modifying factor(s): medications, rest. Vitals:  height is 5' 2"  (1.575 m) and weight is 320 lb (145.2 kg) (abnormal). Her temporal temperature is 98.1 F (36.7 C). Her blood pressure is 120/70 and her pulse is 86. Her respiration is 16 and oxygen saturation is 100%.   Reason for encounter: medication management.   The patient indicates doing well with the current medication regimen. No adverse reactions or side effects reported to the medications.   RTCB: 09/04/2020  Pharmacotherapy Assessment   Analgesic: Morphine ER 30 mg, 1 tab PO q 8 hrs (90 mg/day) + hydrocodone/APAP 5/325, 1 tab PO q 6 hrs (20 mg/dayof hydrocodone) MME/day:197m/day.   Monitoring: [place] PMP: PDMP reviewed during this encounter.       Pharmacotherapy: No side-effects or adverse reactions reported. Compliance: No problems identified. Effectiveness: Clinically acceptable.  SHart Rochester RN  05/26/2020  2:25 PM  Sign when Signing Visit Nursing Pain Medication Assessment:  Safety precautions to be maintained throughout the outpatient stay will include: orient to surroundings, keep bed in low position, maintain call bell within reach at all times, provide assistance with transfer out of bed and ambulation.  Medication Inspection Compliance: Pill count conducted under aseptic conditions, in front of the patient. Neither the pills nor the bottle was removed from the patient's sight at any time. Once count was completed pills were immediately returned to the patient in their original bottle.  Medication #1: Morphine ER (MSContin) Pill/Patch Count: 38 of 90 pills remain Pill/Patch Appearance: Markings consistent with prescribed medication Bottle Appearance: Standard pharmacy container. Clearly labeled. Filled Date: 04 / 29 / 2022 Last Medication intake:  Today  Medication #2: Hydrocodone/APAP Pill/Patch Count: 51 of 120  pills remain Pill/Patch Appearance: Markings consistent with prescribed medication Bottle Appearance: Standard  pharmacy container. Clearly labeled. Filled Date: 04 / 29 / 2022 Last Medication intake:  Today    UDS:  Summary  Date Value Ref Range Status  05/13/2019 Note  Final    Comment:    ==================================================================== ToxASSU[employer] Select 13 (MW) ==================================================================== Test                             Result       Flag       Units Drug Present and Declared for Prescription Verification   Morphine                       >7576        EXPECTED   ng/mg creat   Normorphine                    366          EXPECTED   ng/mg creat    Potential sources of large amounts of morphine in the absence of    codeine include administration of morphine or use of heroin.    Normorphine is an expected metabolite of morphine.   Hydrocodone                    1877         EXPECTED   ng/mg creat   Hydromorphone                  214          EXPECTED   ng/mg creat   Dihydrocodeine                 83           EXPECTED   ng/mg creat   Norhydrocodone                 1636         EXPECTED   ng/mg creat    Sources of hydrocodone include scheduled prescription medications.    Hydromorphone, dihydrocodeine and norhydrocodone are expected    metabolites of hydrocodone. Hydromorphone and dihydrocodeine are    also available as scheduled prescription medications. ==================================================================== Test                      Result    Flag   Units      Ref Range   Creatinine              132              mg/dL      >=20 ==================================================================== Declared Medications:  The flagging and interpretation on this report are based on the  following declared medications.  Unexpected results may arise from  inaccuracies in the declared medications.  **Note: The  testing scope of this panel includes these medications:  Hydrocodone (Norco)  Morphine (MS Contin)  **Note: The testing scope of this panel does not include the  following reported medications:  Acetaminophen (Norco)  Albuterol (Ventolin HFA)  Aspirin  Bupropion (Wellbutrin)  Clotrimazole (Mycelex)  Cyanocobalamin  Enalapril (Vasotec)  Furosemide (Lasix)  Gabapentin (Neurontin)  Insulin  Mirtazapine (Remeron)  Montelukast (Singulair)  Oxybutynin (Ditropan)  Tizanidine (Zanaflex)  Triamcinolone (Kenalog) ==================================================================== For clinical consultation, please call [phone]. ====================================================================      ROS  Constitutional: Denies any fever or chills Gastrointestinal: No reported hemesis, hematochezia, vomiting, or acute GI  distress Musculoskeletal: Denies any acute onset joint swelling, redness, loss of ROM, or weakness Neurological: No reported episodes of acute onset apraxia, aphasia, dysarthria, agnosia, amnesia, paralysis, loss of coordination, or loss of consciousness  Medication Review  BLOOD GLUCOSE TEST STRIPS, Fifty50 Glucose Meter 2.0, HYDROcodone-acetaminophen, Insulin Syringe-Needle U-100, accu-chek multiclix, albuterol, aspirin, buPROPion, clotrimazole, enalapril, ergocalciferol, furosemide, gabapentin, glucose blood, insulin NPH-regular Human, insulin regular, mirtazapine, montelukast, morphine, naloxone, oxybutynin, tiZANidine, and vitamin B-12  History Review  Allergy: Ms. Sasaki is allergic to amoxicillin-pot clavulanate, copaxone  [glatiramer acetate], pseudoephedrine, rofecoxib, sulfa antibiotics, sulfamethoxazole-trimethoprim, and meloxicam. Drug: Ms. Paszkiewicz  reports no history of drug use. Alcohol:  reports no history of alcohol use. Tobacco:  reports that she has quit smoking. She has never used smokeless tobacco. Social: Ms. Gauer  reports that she has  quit smoking. She has never used smokeless tobacco. She reports that she does not drink alcohol and does not use drugs. Medical:  has a past medical history of Allergy, Arthritis, Asthma, Chronic kidney disease, Depression, Diabetes mellitus without complication ([place]), Hypertension, Multiple sclerosis ([place]), Neuromuscular disorder ([place]), and Sleep apnea. Surgical: Ms. Mcmurry  has a past surgical history that includes Cesarean section (1991 and 1996). Family: family history includes Dementia in her mother; Depression in her mother; Diabetes in her mother; Hyperlipidemia in her mother; Stroke in her father.  Laboratory Chemistry Profile   Renal Lab Results  Component Value Date   BUN 18 02/06/2018   CREATININE 1.22 (H) 02/06/2018   BCR 15 02/06/2018   GFRAA 56 (L) 02/06/2018   GFRNONAA 49 (L) 02/06/2018     Hepatic Lab Results  Component Value Date   AST 25 02/06/2018   ALT 19 02/15/2015   ALBUMIN 4.6 02/06/2018   ALKPHOS 97 02/06/2018     Electrolytes Lab Results  Component Value Date   NA 136 02/06/2018   K 4.9 02/06/2018   CL 95 (L) 02/06/2018   CALCIUM 9.8 02/06/2018   MG 1.8 02/06/2018     Bone Lab Results  Component Value Date   25OHVITD1 55 02/06/2018   25OHVITD2 2.9 02/06/2018   25OHVITD3 52 02/06/2018     Inflammation (CRP: Acute Phase) (ESR: Chronic Phase) Lab Results  Component Value Date   CRP 17 (H) 02/06/2018   ESRSEDRATE 28 02/06/2018       Note: Above Lab results reviewed.  Recent Imaging Review  US Abdomen Complete * PRIOR REPORT IMPORTED FROM AN EXTERNAL SYSTEM *   PRIOR REPORT IMPORTED FROM THE SYNGO WORKFLOW SYSTEM   REASON FOR EXAM:    Enlarged Liver Spleen  COMMENTS:   PROCEDURE:     [place]  - US ABDOMEN GENERAL SURVEY  - Jul 29 2012 10:20AM   RESULT:     The liver is enlarged. It demonstrates increased echo texture  consistent with fatty infiltration. Portal venous flow is normal in  direction toward the liver. There is splenomegaly  with maximal dimension  of  the spleen of 16.2 cm.   The gallbladder is adequately distended with no evidence of stones, wall  thickening, or pericholecystic fluid. There is no positive sonographic  Murphy's sign. The common bile duct measures 5.5 mm in diameter.   Evaluation the pancreas is limited by bowel gas. Similarly the abdominal  aorta is only partially visualized. The inferior vena cava and kidneys are  normal in appearance.   IMPRESSION:  1. There is hepatosplenomegaly.  2. The gallbladder and common bile duct and observed portions of the  pancreas  appear normal.  3. The kidneys exhibit no evidence of obstruction nor other acute  abnormality.   Dictation Site: 2    Note: Reviewed        Physical Exam  General appearance: Well nourished, well developed, and well hydrated. In no apparent acute distress Mental status: Alert, oriented x 3 (person, place, & time)       Respiratory: No evidence of acute respiratory distress Eyes: PERLA Vitals: BP 120/70 (BP Location: Right Arm, Patient Position: Sitting, Cuff Size: Large)   Pulse 86   Temp 98.1 F (36.7 C) (Temporal)   Resp 16   Ht 5' 2"  (1.575 m)   Wt (!) 320 lb (145.2 kg)   SpO2 100%   BMI 58.53 kg/m  BMI: Estimated body mass index is 58.53 kg/m as calculated from the following:   Height as of this encounter: 5' 2"  (1.575 m).   Weight as of this encounter: 320 lb (145.2 kg). Ideal: Ideal body weight: 50.1 kg (110 lb 7.2 oz) Adjusted ideal body weight: 88.1 kg (194 lb 4.3 oz)  Assessment   Status Diagnosis  Controlled Controlled Controlled 1. Chronic pain syndrome   2. Failed back surgical syndrome   3. Chronic low back pain (Bilateral) w/ sciatica (Right)   4. Chronic lower extremity pain (Right)   5. Chronic lumbar radicular pain (Right)   6. Chronic flank pain (Right)   7. Chronic hip pain (Left)   8. Fibromyalgia   9. Grade 1 Anterolisthesis of L4/L5   10. Lumbar facet syndrome (Bilateral)   11.  Pharmacologic therapy   12. Chronic use of opiate for therapeutic purpose   13. Uncomplicated opioid dependence ([place])      Updated Problems: No problems updated.  Plan of Care  Problem-specific:  No problem-specific Assessment & Plan notes found for this encounter.  Ms. Hawa Henly has a current medication list which includes the following long-term medication(s): albuterol, bupropion, furosemide, gabapentin, [START ON 06/06/2020] hydrocodone-acetaminophen, [START ON 07/06/2020] hydrocodone-acetaminophen, [START ON 08/05/2020] hydrocodone-acetaminophen, insulin nph-regular human, insulin regular, mirtazapine, montelukast, [START ON 06/06/2020] morphine, [START ON 07/06/2020] morphine, and [START ON 08/05/2020] morphine.  Pharmacotherapy (Medications Ordered): Meds ordered this encounter  Medications  . HYDROcodone-acetaminophen (NORCO/VICODIN) 5-325 MG tablet    Sig: Take 1 tablet by mouth every 6 (six) hours as needed for severe pain. Must last 30 days    Dispense:  120 tablet    Refill:  0    Not a duplicate. Do NOT delete! Dispense 1 day early if closed on refill date. Avoid benzodiazepines within 8 hours of opioids. Do not send refill requests.  Marland Kitchen morphine (MS CONTIN) 30 MG 12 hr tablet    Sig: Take 1 tablet (30 mg total) by mouth every 8 (eight) hours as needed for pain. Must last 30 days    Dispense:  90 tablet    Refill:  0    Not a duplicate. Do NOT delete! Dispense 1 day early if closed on refill date. Avoid benzodiazepines within 8 hours of opioids. Do not send refill requests.  Marland Kitchen HYDROcodone-acetaminophen (NORCO/VICODIN) 5-325 MG tablet    Sig: Take 1 tablet by mouth every 6 (six) hours as needed for severe pain. Must last 30 days    Dispense:  120 tablet    Refill:  0    Not a duplicate. Do NOT delete! Dispense 1 day early if closed on refill date. Avoid benzodiazepines within 8 hours of opioids. Do not send refill requests.  Marland Kitchen  HYDROcodone-acetaminophen (NORCO/VICODIN)  5-325 MG tablet    Sig: Take 1 tablet by mouth every 6 (six) hours as needed for severe pain. Must last 30 days    Dispense:  120 tablet    Refill:  0    Not a duplicate. Do NOT delete! Dispense 1 day early if closed on refill date. Avoid benzodiazepines within 8 hours of opioids. Do not send refill requests.  Marland Kitchen morphine (MS CONTIN) 30 MG 12 hr tablet    Sig: Take 1 tablet (30 mg total) by mouth every 8 (eight) hours as needed for pain. Must last 30 days    Dispense:  90 tablet    Refill:  0    Not a duplicate. Do NOT delete! Dispense 1 day early if closed on refill date. Avoid benzodiazepines within 8 hours of opioids. Do not send refill requests.  Marland Kitchen morphine (MS CONTIN) 30 MG 12 hr tablet    Sig: Take 1 tablet (30 mg total) by mouth every 8 (eight) hours as needed for pain. Must last 30 days    Dispense:  90 tablet    Refill:  0    Not a duplicate. Do NOT delete! Dispense 1 day early if closed on refill date. Avoid benzodiazepines within 8 hours of opioids. Do not send refill requests.   Orders:  Orders Placed This Encounter  Procedures  . ToxASSURE Select 13 (MW), Urine    Volume: 30 ml(s). Minimum 3 ml of urine is needed. Document temperature of fresh sample. Indications: Long term (current) use of opiate analgesic (H73.428)    Order Specific Question:   Release to patient    Answer:   Immediate   Follow-up plan:   Return in about 3 months (around 09/04/2020) for evaluation day (F2F) (MM).      Interventional therapies: Planned, scheduled, and/or pending:   Not at this time.   Considering:   None at this time.    Palliative PRN treatment(s):   None at this time.     Recent Visits Date Type Provider Dept  03/08/20 Office Visit Milinda Pointer, MD [facility]  Showing recent visits within past 90 days and meeting all other requirements Today's Visits Date Type Provider Dept  05/26/20 Office Visit Milinda Pointer, MD [facility]  Showing today's  visits and meeting all other requirements Future Appointments No visits were found meeting these conditions. Showing future appointments within next 90 days and meeting all other requirements  I discussed the assessment and treatment plan with the patient. The patient was provided an opportunity to ask questions and all were answered. The patient agreed with the plan and demonstrated an understanding of the instructions.  Patient advised to call back or seek an in-person evaluation if the symptoms or condition worsens.  Duration of encounter: 30 minutes.  Note by: Gaspar Cola, MD Date: 05/26/2020; Time: 3:11 PM

## 2020-05-26 ENCOUNTER — Encounter: Payer: Self-pay | Admitting: Pain Medicine

## 2020-05-26 ENCOUNTER — Other Ambulatory Visit: Payer: Self-pay

## 2020-05-26 ENCOUNTER — Ambulatory Visit: Payer: Medicare HMO | Attending: Pain Medicine | Admitting: Pain Medicine

## 2020-05-26 VITALS — BP 120/70 | HR 86 | Temp 98.1°F | Resp 16 | Ht 62.0 in | Wt 320.0 lb

## 2020-05-26 DIAGNOSIS — M5441 Lumbago with sciatica, right side: Secondary | ICD-10-CM | POA: Diagnosis not present

## 2020-05-26 DIAGNOSIS — M79604 Pain in right leg: Secondary | ICD-10-CM | POA: Insufficient documentation

## 2020-05-26 DIAGNOSIS — G8929 Other chronic pain: Secondary | ICD-10-CM | POA: Diagnosis present

## 2020-05-26 DIAGNOSIS — G894 Chronic pain syndrome: Secondary | ICD-10-CM | POA: Diagnosis not present

## 2020-05-26 DIAGNOSIS — F1120 Opioid dependence, uncomplicated: Secondary | ICD-10-CM | POA: Insufficient documentation

## 2020-05-26 DIAGNOSIS — Z79891 Long term (current) use of opiate analgesic: Secondary | ICD-10-CM | POA: Diagnosis present

## 2020-05-26 DIAGNOSIS — M797 Fibromyalgia: Secondary | ICD-10-CM | POA: Diagnosis present

## 2020-05-26 DIAGNOSIS — M47816 Spondylosis without myelopathy or radiculopathy, lumbar region: Secondary | ICD-10-CM

## 2020-05-26 DIAGNOSIS — Z79899 Other long term (current) drug therapy: Secondary | ICD-10-CM | POA: Insufficient documentation

## 2020-05-26 DIAGNOSIS — M961 Postlaminectomy syndrome, not elsewhere classified: Secondary | ICD-10-CM | POA: Diagnosis not present

## 2020-05-26 DIAGNOSIS — M25552 Pain in left hip: Secondary | ICD-10-CM

## 2020-05-26 DIAGNOSIS — R109 Unspecified abdominal pain: Secondary | ICD-10-CM | POA: Insufficient documentation

## 2020-05-26 DIAGNOSIS — M5416 Radiculopathy, lumbar region: Secondary | ICD-10-CM | POA: Diagnosis present

## 2020-05-26 DIAGNOSIS — M4310 Spondylolisthesis, site unspecified: Secondary | ICD-10-CM | POA: Insufficient documentation

## 2020-05-26 MED ORDER — HYDROCODONE-ACETAMINOPHEN 5-325 MG PO TABS: 1.0000 | ORAL_TABLET | Freq: Four times a day (QID) | ORAL | 0 refills | Status: DC | PRN

## 2020-05-26 MED ORDER — MORPHINE SULFATE ER 30 MG PO TBCR
30.0000 mg | EXTENDED_RELEASE_TABLET | Freq: Three times a day (TID) | ORAL | 0 refills | Status: DC | PRN
Start: 2020-08-05 — End: 2020-08-31

## 2020-05-26 MED ORDER — MORPHINE SULFATE ER 30 MG PO TBCR
30.0000 mg | EXTENDED_RELEASE_TABLET | Freq: Three times a day (TID) | ORAL | 0 refills | Status: DC | PRN
Start: 2020-07-06 — End: 2020-08-31

## 2020-05-26 MED ORDER — MORPHINE SULFATE ER 30 MG PO TBCR: 30.0000 mg | EXTENDED_RELEASE_TABLET | Freq: Three times a day (TID) | ORAL | 0 refills | Status: DC | PRN

## 2020-05-26 NOTE — Progress Notes (Signed)
Nursing Pain Medication Assessment:  Safety precautions to be maintained throughout the outpatient stay will include: orient to surroundings, keep bed in low position, maintain call bell within reach at all times, provide assistance with transfer out of bed and ambulation.  Medication Inspection Compliance: Pill count conducted under aseptic conditions, in front of the patient. Neither the pills nor the bottle was removed from the patient's sight at any time. Once count was completed pills were immediately returned to the patient in their original bottle.  Medication #1: Morphine ER (MSContin) Pill/Patch Count: 38 of 90 pills remain Pill/Patch Appearance: Markings consistent with prescribed medication Bottle Appearance: Standard pharmacy container. Clearly labeled. Filled Date: 04 / 29 / 2022 Last Medication intake:  Today  Medication #2: Hydrocodone/APAP Pill/Patch Count: 51 of 120 pills remain Pill/Patch Appearance: Markings consistent with prescribed medication Bottle Appearance: Standard pharmacy container. Clearly labeled. Filled Date: 04 / 29 / 2022 Last Medication intake:  Today

## 2020-05-26 NOTE — Patient Instructions (Signed)
____________________________________________________________________________________________  Drug Holidays (Slow)  What is a "Drug Holiday"? Drug Holiday: is the name given to the period of time during which a patient stops taking a medication(s) for the purpose of eliminating tolerance to the drug.  Benefits . Improved effectiveness of opioids. . Decreased opioid dose needed to achieve benefits. . Improved pain with lesser dose.  What is tolerance? Tolerance: is the progressive decreased in effectiveness of a drug due to its repetitive use. With repetitive use, the body gets use to the medication and as a consequence, it loses its effectiveness. This is a common problem seen with opioid pain medications. As a result, a larger dose of the drug is needed to achieve the same effect that used to be obtained with a smaller dose.  How long should a "Drug Holiday" last? You should stay off of the pain medicine for at least 14 consecutive days. (2 weeks)  Should I stop the medicine "cold turkey"? No. You should always coordinate with your Pain Specialist so that he/she can provide you with the correct medication dose to make the transition as smoothly as possible.  How do I stop the medicine? Slowly. You will be instructed to decrease the daily amount of pills that you take by one (1) pill every seven (7) days. This is called a "slow downward taper" of your dose. For example: if you normally take four (4) pills per day, you will be asked to drop this dose to three (3) pills per day for seven (7) days, then to two (2) pills per day for seven (7) days, then to one (1) per day for seven (7) days, and at the end of those last seven (7) days, this is when the "Drug Holiday" would start.   Will I have withdrawals? By doing a "slow downward taper" like this one, it is unlikely that you will experience any significant withdrawal symptoms. Typically, what triggers withdrawals is the sudden stop of a high  dose opioid therapy. Withdrawals can usually be avoided by slowly decreasing the dose over a prolonged period of time. If you do not follow these instructions and decide to stop your medication abruptly, withdrawals may be possible.  What are withdrawals? Withdrawals: refers to the wide range of symptoms that occur after stopping or dramatically reducing opiate drugs after heavy and prolonged use. Withdrawal symptoms do not occur to patients that use low dose opioids, or those who take the medication sporadically. Contrary to benzodiazepine (example: Valium, Xanax, etc.) or alcohol withdrawals ("Delirium Tremens"), opioid withdrawals are not lethal. Withdrawals are the physical manifestation of the body getting rid of the excess receptors.  Expected Symptoms Early symptoms of withdrawal may include: . Agitation . Anxiety . Muscle aches . Increased tearing . Insomnia . Runny nose . Sweating . Yawning  Late symptoms of withdrawal may include: . Abdominal cramping . Diarrhea . Dilated pupils . Goose bumps . Nausea . Vomiting  Will I experience withdrawals? Due to the slow nature of the taper, it is very unlikely that you will experience any.  What is a slow taper? Taper: refers to the gradual decrease in dose.  (Last update: 07/30/2019) ____________________________________________________________________________________________    ____________________________________________________________________________________________  Medication Recommendations and Reminders  Applies to: All patients receiving prescriptions (written and/or electronic).  Medication Rules & Regulations: These rules and regulations exist for your safety and that of others. They are not flexible and neither are we. Dismissing or ignoring them will be considered "non-compliance" with medication therapy, resulting in complete   and irreversible termination of such therapy. (See document titled "Medication Rules" for  more details.) In all conscience, because of safety reasons, we cannot continue providing a therapy where the patient does not follow instructions.  Pharmacy of record:   Definition: This is the pharmacy where your electronic prescriptions will be sent.   We do not endorse any particular pharmacy, however, we have experienced problems with [facility] not securing enough medication supply for the community.  We do not restrict you in your choice of pharmacy. However, once we write for your prescriptions, we will NOT be re-sending more prescriptions to fix restricted supply problems created by your pharmacy, or your insurance.   The pharmacy listed in the electronic medical record should be the one where you want electronic prescriptions to be sent.  If you choose to change pharmacy, simply notify our nursing staff.  Recommendations:  Keep all of your pain medications in a safe place, under lock and key, even if you live alone. We will NOT replace lost, stolen, or damaged medication.  After you fill your prescription, take 1 week's worth of pills and put them away in a safe place. You should keep a separate, properly labeled bottle for this purpose. The remainder should be kept in the original bottle. Use this as your primary supply, until it runs out. Once it's gone, then you know that you have 1 week's worth of medicine, and it is time to come in for a prescription refill. If you do this correctly, it is unlikely that you will ever run out of medicine.  To make sure that the above recommendation works, it is very important that you make sure your medication refill appointments are scheduled at least 1 week before you run out of medicine. To do this in an effective manner, make sure that you do not leave the office without scheduling your next medication management appointment. Always ask the nursing staff to show you in your prescription , when your medication will be running out. Then arrange for  the receptionist to get you a return appointment, at least 7 days before you run out of medicine. Do not wait until you have 1 or 2 pills left, to come in. This is very poor planning and does not take into consideration that we may need to cancel appointments due to bad weather, sickness, or emergencies affecting our staff.  DO NOT ACCEPT A "Partial Fill": If for any reason your pharmacy does not have enough pills/tablets to completely fill or refill your prescription, do not allow for a "partial fill". The law allows the pharmacy to complete that prescription within 72 hours, without requiring a new prescription. If they do not fill the rest of your prescription within those 72 hours, you will need a separate prescription to fill the remaining amount, which we will NOT provide. If the reason for the partial fill is your insurance, you will need to talk to the pharmacist about payment alternatives for the remaining tablets, but again, DO NOT ACCEPT A PARTIAL FILL, unless you can trust your pharmacist to obtain the remainder of the pills within 72 hours.  Prescription refills and/or changes in medication(s):   Prescription refills, and/or changes in dose or medication, will be conducted only during scheduled medication management appointments. (Applies to both, written and electronic prescriptions.)  No refills on procedure days. No medication will be changed or started on procedure days. No changes, adjustments, and/or refills will be conducted on a procedure day. Doing so   will interfere with the diagnostic portion of the procedure.  No phone refills. No medications will be "called into the pharmacy".  No Fax refills.  No weekend refills.  No Holliday refills.  No after hours refills.  Remember:  Business hours are:  Monday to Thursday 8:00 AM to 4:00 PM Provider's Schedule: Jessia Kief, MD - Appointments are:  Medication management: Monday and Wednesday 8:00 AM to 4:00 PM Procedure  day: Tuesday and Thursday 7:30 AM to 4:00 PM Bilal Lateef, MD - Appointments are:  Medication management: Tuesday and Thursday 8:00 AM to 4:00 PM Procedure day: Monday and Wednesday 7:30 AM to 4:00 PM (Last update: 07/30/2019) ____________________________________________________________________________________________   ____________________________________________________________________________________________  CBD (cannabidiol) WARNING  Applicable to: All individuals currently taking or considering taking CBD (cannabidiol) and, more important, all patients taking opioid analgesic controlled substances (pain medication). (Example: oxycodone; oxymorphone; hydrocodone; hydromorphone; morphine; methadone; tramadol; tapentadol; fentanyl; buprenorphine; butorphanol; dextromethorphan; meperidine; codeine; etc.)  Legal status: CBD remains a Schedule I drug prohibited for any use. CBD is illegal with one exception. In the [place], CBD has a limited Food and Drug Administration (FDA) approval for the treatment of two specific types of epilepsy disorders. Only one CBD product has been approved by the FDA for this purpose: "Epidiolex". FDA is aware that some companies are marketing products containing cannabis and cannabis-derived compounds in ways that violate the Federal Food, Drug and Cosmetic Act (FD&C Act) and that may put the health and safety of consumers at risk. The FDA, a Federal agency, has not enforced the CBD status since 2018.   Legality: Some manufacturers ship CBD products nationally, which is illegal. Often such products are sold online and are therefore available throughout the country. CBD is openly sold in head shops and health food stores in some states where such sales have not been explicitly legalized. Selling unapproved products with unsubstantiated therapeutic claims is not only a violation of the law, but also can put patients at risk, as these products have not been proven to  be safe or effective. Federal illegality makes it difficult to conduct research on CBD.  Reference: "FDA Regulation of Cannabis and Cannabis-Derived Products, Including Cannabidiol (CBD)" - https://www.fda.gov/news-events/public-health-focus/fda-regulation-cannabis-and-cannabis-derived-products-including-cannabidiol-cbd  Warning: CBD is not FDA approved and has not undergo the same manufacturing controls as prescription drugs.  This means that the purity and safety of available CBD may be questionable. Most of the time, despite manufacturer's claims, it is contaminated with THC (delta-9-tetrahydrocannabinol - the chemical in marijuana responsible for the "HIGH").  When this is the case, the THC contaminant will trigger a positive urine drug screen (UDS) test for Marijuana (carboxy-THC). Because a positive UDS for any illicit substance is a violation of our medication agreement, your opioid analgesics (pain medicine) may be permanently discontinued.  MORE ABOUT CBD  General Information: CBD  is a derivative of the Marijuana (cannabis sativa) plant discovered in 1940. It is one of the 113 identified substances found in Marijuana. It accounts for up to 40% of the plant's extract. As of 2018, preliminary clinical studies on CBD included research for the treatment of anxiety, movement disorders, and pain. CBD is available and consumed in multiple forms, including inhalation of smoke or vapor, as an aerosol spray, and by mouth. It may be supplied as an oil containing CBD, capsules, dried cannabis, or as a liquid solution. CBD is thought not to be as psychoactive as THC (delta-9-tetrahydrocannabinol - the chemical in marijuana responsible for the "HIGH"). Studies suggest that CBD may interact   with different biological target receptors in the body, including cannabinoid and other neurotransmitter receptors. As of 2018 the mechanism of action for its biological effects has not been determined.  Side-effects   Adverse reactions: Dry mouth, diarrhea, decreased appetite, fatigue, drowsiness, malaise, weakness, sleep disturbances, and others.  Drug interactions: CBC may interact with other medications such as blood-thinners. (Last update: 08/16/2019) ____________________________________________________________________________________________   ____________________________________________________________________________________________  Medication Rules  Purpose: To inform patients, and their family members, of our rules and regulations.  Applies to: All patients receiving prescriptions (written or electronic).  Pharmacy of record: Pharmacy where electronic prescriptions will be sent. If written prescriptions are taken to a different pharmacy, please inform the nursing staff. The pharmacy listed in the electronic medical record should be the one where you would like electronic prescriptions to be sent.  Electronic prescriptions: In compliance with the [place] Strengthen Opioid Misuse Prevention (STOP) Act of 2017 (Session Law 2017-74/H243), effective January 09, 2018, all controlled substances must be electronically prescribed. Calling prescriptions to the pharmacy will cease to exist.  Prescription refills: Only during scheduled appointments. Applies to all prescriptions.  NOTE: The following applies primarily to controlled substances (Opioid* Pain Medications).   Type of encounter (visit): For patients receiving controlled substances, face-to-face visits are required. (Not an option or up to the patient.)  Patient's responsibilities: 1. Pain Pills: Bring all pain pills to every appointment (except for procedure appointments). 2. Pill Bottles: Bring pills in original pharmacy bottle. Always bring the newest bottle. Bring bottle, even if empty. 3. Medication refills: You are responsible for knowing and keeping track of what medications you take and those you need refilled. The day before  your appointment: write a list of all prescriptions that need to be refilled. The day of the appointment: give the list to the admitting nurse. Prescriptions will be written only during appointments. No prescriptions will be written on procedure days. If you forget a medication: it will not be "Called in", "Faxed", or "electronically sent". You will need to get another appointment to get these prescribed. No early refills. Do not call asking to have your prescription filled early. 4. Prescription Accuracy: You are responsible for carefully inspecting your prescriptions before leaving our office. Have the discharge nurse carefully go over each prescription with you, before taking them home. Make sure that your name is accurately spelled, that your address is correct. Check the name and dose of your medication to make sure it is accurate. Check the number of pills, and the written instructions to make sure they are clear and accurate. Make sure that you are given enough medication to last until your next medication refill appointment. 5. Taking Medication: Take medication as prescribed. When it comes to controlled substances, taking less pills or less frequently than prescribed is permitted and encouraged. Never take more pills than instructed. Never take medication more frequently than prescribed.  6. Inform other Doctors: Always inform, all of your healthcare providers, of all the medications you take. 7. Pain Medication from other Providers: You are not allowed to accept any additional pain medication from any other Doctor or Healthcare provider. There are two exceptions to this rule. (see below) In the event that you require additional pain medication, you are responsible for notifying us, as stated below. 8. Cough Medicine: Often these contain an opioid, such as codeine or hydrocodone. Never accept or take cough medicine containing these opioids if you are already taking an opioid* medication. The  combination may cause respiratory failure and   death. 9. Medication Agreement: You are responsible for carefully reading and following our Medication Agreement. This must be signed before receiving any prescriptions from our practice. Safely store a copy of your signed Agreement. Violations to the Agreement will result in no further prescriptions. (Additional copies of our Medication Agreement are available upon request.) 10. Laws, Rules, & Regulations: All patients are expected to follow all Federal and State Laws, Statutes, Rules, & Regulations. Ignorance of the Laws does not constitute a valid excuse.  11. Illegal drugs and Controlled Substances: The use of illegal substances (including, but not limited to marijuana and its derivatives) and/or the illegal use of any controlled substances is strictly prohibited. Violation of this rule may result in the immediate and permanent discontinuation of any and all prescriptions being written by our practice. The use of any illegal substances is prohibited. 12. Adopted CDC guidelines & recommendations: Target dosing levels will be at or below 60 MME/day. Use of benzodiazepines** is not recommended.  Exceptions: There are only two exceptions to the rule of not receiving pain medications from other Healthcare Providers. 1. Exception #1 (Emergencies): In the event of an emergency (i.e.: accident requiring emergency care), you are allowed to receive additional pain medication. However, you are responsible for: As soon as you are able, call our office [phone], at any time of the day or night, and leave a message stating your name, the date and nature of the emergency, and the name and dose of the medication prescribed. In the event that your call is answered by a member of our staff, make sure to document and save the date, time, and the name of the person that took your information.  2. Exception #2 (Planned Surgery): In the event that you are scheduled by  another doctor or dentist to have any type of surgery or procedure, you are allowed (for a period no longer than 30 days), to receive additional pain medication, for the acute post-op pain. However, in this case, you are responsible for picking up a copy of our "Post-op Pain Management for Surgeons" handout, and giving it to your surgeon or dentist. This document is available at our office, and does not require an appointment to obtain it. Simply go to our office during business hours (Monday-Thursday from 8:00 AM to 4:00 PM) (Friday 8:00 AM to 12:00 Noon) or if you have a scheduled appointment with us, prior to your surgery, and ask for it by name. In addition, you are responsible for: calling our office [phone], at any time of the day or night, and leaving a message stating your name, name of your surgeon, type of surgery, and date of procedure or surgery. Failure to comply with your responsibilities may result in termination of therapy involving the controlled substances.  *Opioid medications include: morphine, codeine, oxycodone, oxymorphone, hydrocodone, hydromorphone, meperidine, tramadol, tapentadol, buprenorphine, fentanyl, methadone. **Benzodiazepine medications include: diazepam (Valium), alprazolam (Xanax), clonazepam (Klonopine), lorazepam (Ativan), clorazepate (Tranxene), chlordiazepoxide (Librium), estazolam (Prosom), oxazepam (Serax), temazepam (Restoril), triazolam (Halcion) (Last updated: 12/08/2019) ____________________________________________________________________________________________    

## 2020-05-31 ENCOUNTER — Encounter: Payer: Medicare HMO | Admitting: Pain Medicine

## 2020-06-03 LAB — TOXASSURE SELECT 13 (MW), URINE

## 2020-08-31 NOTE — Progress Notes (Signed)
PROVIDER NOTE: Information contained herein reflects review and annotations entered in association with encounter. Interpretation of such information and data should be left to medically-trained personnel. Information provided to patient can be located elsewhere in the medical record under "Patient Instructions". Document created using STT-dictation technology, any transcriptional errors that may result from process are unintentional.    Patient: Tiffany Herring  Service Category: E/M  Provider: Gaspar Cola, MD  DOB: 1960-04-13  DOS: 09/01/2020  Specialty: Interventional Pain Management  MRN: [id number]  Setting: Ambulatory outpatient  PCP: Hortencia Pilar, MD  Type: Established Patient    Referring Provider: Hortencia Pilar, MD  Location: Office  Delivery: Face-to-face     HPI  [name], a 60 y.o. year old female, is here today because of her Failed back surgical syndrome [M96.1]. [name] primary complain today is Hip Pain (left) Last encounter: My last encounter with her was on 05/26/2020. Pertinent problems: [name] has Failed back surgical syndrome; Epidural fibrosis; Chronic low back pain (Bilateral) w/ sciatica (Right); Chronic lumbar radicular pain (Right); Generalized OA; Multiple sclerosis ([place]); Degenerative arthritis of hip; Fibromyalgia; Lumbar spondylosis; Grade 1 Anterolisthesis of L4/L5; Chronic sacroiliac joint pain (bilateral); Chronic hip pain (Left); Arthropathy of left hip (Severe chronic left hip DJD); Osteoarthritis of hip (Left); Lumbar facet arthropathy ([place]); Lumbar facet hypertrophy; Lumbar facet syndrome (Bilateral); Chronic flank pain (Right); Chronic lower extremity pain (Right); Chronic pain syndrome; Venous stasis ulcer ([place]); OA (osteoarthritis); Osteoarthrosis, hip; and Generalized osteoarthritis on their pertinent problem list. Pain Assessment: Severity of Chronic pain is reported as a 3 /10. Location: Hip Left/left leg to just below the knee.  Onset: More than a month ago. Quality: Sharp, Stabbing. Timing: Constant. Modifying factor(s): medications. Vitals:  height is 5' 2"  (1.575 m) and weight is 310 lb (140.6 kg) (abnormal). Her temporal temperature is 97.2 F (36.2 C) (abnormal). Her blood pressure is 97/52 (abnormal) and her pulse is 80. Her respiration is 16 and oxygen saturation is 100%.   Reason for encounter: medication management.   The patient indicates doing well with the current medication regimen. No adverse reactions or side effects reported to the medications.   RTCB: 12/03/2020  Pharmacotherapy Assessment  Analgesic: Morphine ER 30 mg, 1 tab PO q 8 hrs (90 mg/day) + hydrocodone/APAP 5/325, 1 tab PO q 6 hrs (20 mg/day of hydrocodone) MME/day: 110 mg/day.   Monitoring: [place] PMP: PDMP reviewed during this encounter.       Pharmacotherapy: No side-effects or adverse reactions reported. Compliance: No problems identified. Effectiveness: Clinically acceptable.  Landis Martins, RN  09/01/2020 12:51 PM  Sign when Signing Visit Nursing Pain Medication Assessment:  Safety precautions to be maintained throughout the outpatient stay will include: orient to surroundings, keep bed in low position, maintain call bell within reach at all times, provide assistance with transfer out of bed and ambulation.  Medication Inspection Compliance: Pill count conducted under aseptic conditions, in front of the patient. Neither the pills nor the bottle was removed from the patient's sight at any time. Once count was completed pills were immediately returned to the patient in their original bottle.  Medication #1: Hydrocodone/APAP Pill/Patch Count:  19 of 120 pills remain Pill/Patch Appearance: Markings consistent with prescribed medication Bottle Appearance: Standard pharmacy container. Clearly labeled. Filled Date: 07 / 28 / 2022 Last Medication intake:  Today  Medication #2: Morphine IR Pill/Patch Count:  14 of 90 pills  remain Pill/Patch Appearance: Markings consistent with prescribed medication Bottle Appearance:  Standard pharmacy container. Clearly labeled. Filled Date: 07 / 28 / 2024 Last Medication intake:  Today    UDS:  Summary  Date Value Ref Range Status  05/27/2020 Note  Final    Comment:    ==================================================================== [employer] (MW) ==================================================================== Test                             Result       Flag       Units  Drug Present and Declared for Prescription Verification   Morphine                       >[id number]        EXPECTED   ng/mg creat   Normorphine                    263          EXPECTED   ng/mg creat    Potential sources of large amounts of morphine in the absence of    codeine include administration of morphine or use of heroin.     Normorphine is an expected metabolite of morphine.    Hydrocodone                    778          EXPECTED   ng/mg creat   Hydromorphone                  155          EXPECTED   ng/mg creat   Dihydrocodeine                 55           EXPECTED   ng/mg creat   Norhydrocodone                 952          EXPECTED   ng/mg creat    Sources of hydrocodone include scheduled prescription medications.    Hydromorphone, dihydrocodeine and norhydrocodone are expected    metabolites of hydrocodone. Hydromorphone and dihydrocodeine are    also available as scheduled prescription medications.  ==================================================================== Test                      Result    Flag   Units      Ref Range   Creatinine              130              mg/dL      >=20 ==================================================================== Declared Medications:  The flagging and interpretation on this report are based on the  following declared medications.  Unexpected results may arise from  inaccuracies in the declared medications.   **Note: The  testing scope of this panel includes these medications:   Hydrocodone (Norco)  Morphine (MS Contin)   **Note: The testing scope of this panel does not include the  following reported medications:   Acetaminophen (Norco)  Insulin  Mirtazapine (Remeron)  Montelukast (Singulair)  Naloxone (Narcan)  Oxybutynin (Ditropan)  Tizanidine (Zanaflex) ==================================================================== For clinical consultation, please call [phone]. ====================================================================      ROS  Constitutional: Denies any fever or chills Gastrointestinal: No reported hemesis, hematochezia, vomiting, or acute GI distress Musculoskeletal: Denies any acute onset joint swelling, redness, loss of ROM,  or weakness Neurological: No reported episodes of acute onset apraxia, aphasia, dysarthria, agnosia, amnesia, paralysis, loss of coordination, or loss of consciousness  Medication Review  BLOOD GLUCOSE TEST STRIPS, Fifty50 Glucose Meter 2.0, HYDROcodone-acetaminophen, Insulin Syringe-Needle U-100, accu-chek multiclix, albuterol, aspirin, buPROPion, clotrimazole, doxycycline, enalapril, ergocalciferol, furosemide, gabapentin, glucose blood, insulin NPH-regular Human, insulin regular, mirtazapine, montelukast, morphine, mupirocin ointment, naloxone, oxybutynin, tiZANidine, timolol, and vitamin B-12  History Review  Allergy: [name] is allergic to amoxicillin-pot clavulanate, copaxone  [glatiramer acetate], pseudoephedrine, rofecoxib, sulfa antibiotics, sulfamethoxazole-trimethoprim, and meloxicam. Drug: [name]  reports no history of drug use. Alcohol:  reports no history of alcohol use. Tobacco:  reports that she has quit smoking. She has never used smokeless tobacco. Social: [name]  reports that she has quit smoking. She has never used smokeless tobacco. She reports that she does not drink alcohol and does not use drugs. Medical:   has a past medical history of Allergy, Arthritis, Asthma, Chronic kidney disease, Depression, Diabetes mellitus without complication ([place]), Hypertension, Multiple sclerosis ([place]), Neuromuscular disorder ([place]), and Sleep apnea. Surgical: [name]  has a past surgical history that includes Cesarean section (1991 and 1996). Family: family history includes Dementia in her mother; Depression in her mother; Diabetes in her mother; Hyperlipidemia in her mother; Stroke in her father.  Laboratory Chemistry Profile   Renal Lab Results  Component Value Date   BUN 18 02/06/2018   CREATININE 1.22 (H) 02/06/2018   BCR 15 02/06/2018   GFRAA 56 (L) 02/06/2018   GFRNONAA 49 (L) 02/06/2018    Hepatic Lab Results  Component Value Date   AST 25 02/06/2018   ALT 19 02/15/2015   ALBUMIN 4.6 02/06/2018   ALKPHOS 97 02/06/2018    Electrolytes Lab Results  Component Value Date   NA 136 02/06/2018   K 4.9 02/06/2018   CL 95 (L) 02/06/2018   CALCIUM 9.8 02/06/2018   MG 1.8 02/06/2018    Bone Lab Results  Component Value Date   25OHVITD1 55 02/06/2018   25OHVITD2 2.9 02/06/2018   25OHVITD3 52 02/06/2018    Inflammation (CRP: Acute Phase) (ESR: Chronic Phase) Lab Results  Component Value Date   CRP 17 (H) 02/06/2018   ESRSEDRATE 28 02/06/2018         Note: Above Lab results reviewed.  Recent Imaging Review  (08/19/2013) US Abdomen Complete * PRIOR REPORT IMPORTED FROM AN EXTERNAL SYSTEM *   PRIOR REPORT IMPORTED FROM THE [facility] WORKFLOW SYSTEM   REASON FOR EXAM:    Enlarged Liver Spleen  COMMENTS:   PROCEDURE:     [place]  - US ABDOMEN GENERAL SURVEY  - Jul 29 2012 10:20AM   RESULT:     The liver is enlarged. It demonstrates increased echo texture  consistent with fatty infiltration. Portal venous flow is normal in  direction toward the liver. There is splenomegaly with maximal dimension  of  the spleen of 16.2 cm.   The gallbladder is adequately distended with no evidence of stones,  wall  thickening, or pericholecystic fluid. There is no positive sonographic  Murphy's sign. The common bile duct measures 5.5 mm in diameter.   Evaluation the pancreas is limited by bowel gas. Similarly the abdominal  aorta is only partially visualized. The inferior vena cava and kidneys are  normal in appearance.   IMPRESSION:  1. There is hepatosplenomegaly.  2. The gallbladder and common bile duct and observed portions of the  pancreas appear normal.  3. The kidneys exhibit no evidence  of obstruction nor other acute  abnormality.   Dictation Site: 2    Note: Reviewed        Physical Exam  General appearance: Well nourished, well developed, and well hydrated. In no apparent acute distress Mental status: Alert, oriented x 3 (person, place, & time)       Respiratory: No evidence of acute respiratory distress Eyes: PERLA Vitals: BP (!) 97/52 (BP Location: Right Arm, Patient Position: Sitting, Cuff Size: Large)   Pulse 80   Temp (!) 97.2 F (36.2 C) (Temporal)   Resp 16   Ht 5' 2"  (1.575 m)   Wt (!) 310 lb (140.6 kg)   SpO2 100%   BMI 56.70 kg/m  BMI: Estimated body mass index is 56.7 kg/m as calculated from the following:   Height as of this encounter: 5' 2"  (1.575 m).   Weight as of this encounter: 310 lb (140.6 kg). Ideal: Ideal body weight: 50.1 kg (110 lb 7.2 oz) Adjusted ideal body weight: 86.3 kg (190 lb 4.3 oz)  Assessment   Status Diagnosis  Controlled Controlled Controlled 1. Failed back surgical syndrome   2. Multiple sclerosis ([place])   3. Chronic low back pain (Bilateral) w/ sciatica (Right)   4. Epidural fibrosis   5. Chronic lower extremity pain (Right)   6. Chronic lumbar radicular pain (Right)   7. Grade 1 Anterolisthesis of L4/L5   8. Lumbar facet hypertrophy   9. Lumbar facet arthropathy ([place])   10. Lumbar facet syndrome (Bilateral)   11. Fibromyalgia   12. Chronic pain syndrome   13. Pharmacologic therapy   14. Chronic use of opiate for  therapeutic purpose   15. Encounter for medication management      Updated Problems: No problems updated.  Plan of Care  Problem-specific:  No problem-specific Assessment & Plan notes found for this encounter.  [name] has a current medication list which includes the following long-term medication(s): albuterol, bupropion, bupropion, furosemide, gabapentin, [START ON 09/04/2020] hydrocodone-acetaminophen, [START ON 10/04/2020] hydrocodone-acetaminophen, [START ON 11/03/2020] hydrocodone-acetaminophen, insulin nph-regular human, insulin regular, mirtazapine, montelukast, [START ON 09/04/2020] morphine, [START ON 10/04/2020] morphine, and [START ON 11/03/2020] morphine.  Pharmacotherapy (Medications Ordered): Meds ordered this encounter  Medications   HYDROcodone-acetaminophen (NORCO/VICODIN) 5-325 MG tablet    Sig: Take 1 tablet by mouth every 6 (six) hours as needed for severe pain. Must last 30 days    Dispense:  120 tablet    Refill:  0    Not a duplicate. Do NOT delete! Dispense 1 day early if closed on refill date. Avoid benzodiazepines within 8 hours of opioids. Do not send refill requests.   HYDROcodone-acetaminophen (NORCO/VICODIN) 5-325 MG tablet    Sig: Take 1 tablet by mouth every 6 (six) hours as needed for severe pain. Must last 30 days    Dispense:  120 tablet    Refill:  0    Not a duplicate. Do NOT delete! Dispense 1 day early if closed on refill date. Avoid benzodiazepines within 8 hours of opioids. Do not send refill requests.   HYDROcodone-acetaminophen (NORCO/VICODIN) 5-325 MG tablet    Sig: Take 1 tablet by mouth every 6 (six) hours as needed for severe pain. Must last 30 days    Dispense:  120 tablet    Refill:  0    Not a duplicate. Do NOT delete! Dispense 1 day early if closed on refill date. Avoid benzodiazepines within 8 hours of opioids. Do not send refill requests.   morphine (  MS CONTIN) 30 MG 12 hr tablet    Sig: Take 1 tablet (30 mg total) by  mouth every 8 (eight) hours as needed for pain. Must last 30 days    Dispense:  90 tablet    Refill:  0    Not a duplicate. Do NOT delete! Dispense 1 day early if closed on refill date. Avoid benzodiazepines within 8 hours of opioids. Do not send refill requests.   morphine (MS CONTIN) 30 MG 12 hr tablet    Sig: Take 1 tablet (30 mg total) by mouth every 8 (eight) hours as needed for pain. Must last 30 days    Dispense:  90 tablet    Refill:  0    Not a duplicate. Do NOT delete! Dispense 1 day early if closed on refill date. Avoid benzodiazepines within 8 hours of opioids. Do not send refill requests.   morphine (MS CONTIN) 30 MG 12 hr tablet    Sig: Take 1 tablet (30 mg total) by mouth every 8 (eight) hours as needed for pain. Must last 30 days    Dispense:  90 tablet    Refill:  0    Not a duplicate. Do NOT delete! Dispense 1 day early if closed on refill date. Avoid benzodiazepines within 8 hours of opioids. Do not send refill requests.    Orders:  No orders of the defined types were placed in this encounter.  Follow-up plan:   Return in about 3 months (around 12/03/2020) for Eval-day(M,W), (F2F), (MM).     Interventional therapies: Planned, scheduled, and/or pending:   Not at this time.   Considering:   None at this time.    Palliative PRN treatment(s):   None at this time.     Recent Visits No visits were found meeting these conditions. Showing recent visits within past 90 days and meeting all other requirements Today's Visits Date Type Provider Dept  09/01/20 Office Visit Milinda Pointer, MD [facility]  Showing today's visits and meeting all other requirements Future Appointments No visits were found meeting these conditions. Showing future appointments within next 90 days and meeting all other requirements I discussed the assessment and treatment plan with the patient. The patient was provided an opportunity to ask questions and all were answered. The  patient agreed with the plan and demonstrated an understanding of the instructions.  Patient advised to call back or seek an in-person evaluation if the symptoms or condition worsens.  Duration of encounter: 30 minutes.  Note by: Gaspar Cola, MD Date: 09/01/2020; Time: 2:06 PM

## 2020-09-01 ENCOUNTER — Encounter: Payer: Self-pay | Admitting: Pain Medicine

## 2020-09-01 ENCOUNTER — Ambulatory Visit: Payer: Medicare HMO | Attending: Pain Medicine | Admitting: Pain Medicine

## 2020-09-01 ENCOUNTER — Other Ambulatory Visit: Payer: Self-pay

## 2020-09-01 VITALS — BP 97/52 | HR 80 | Temp 97.2°F | Resp 16 | Ht 62.0 in | Wt 310.0 lb

## 2020-09-01 DIAGNOSIS — M47816 Spondylosis without myelopathy or radiculopathy, lumbar region: Secondary | ICD-10-CM | POA: Insufficient documentation

## 2020-09-01 DIAGNOSIS — M5441 Lumbago with sciatica, right side: Secondary | ICD-10-CM | POA: Insufficient documentation

## 2020-09-01 DIAGNOSIS — G894 Chronic pain syndrome: Secondary | ICD-10-CM | POA: Insufficient documentation

## 2020-09-01 DIAGNOSIS — Z79891 Long term (current) use of opiate analgesic: Secondary | ICD-10-CM | POA: Diagnosis present

## 2020-09-01 DIAGNOSIS — M4310 Spondylolisthesis, site unspecified: Secondary | ICD-10-CM | POA: Diagnosis present

## 2020-09-01 DIAGNOSIS — G96198 Other disorders of meninges, not elsewhere classified: Secondary | ICD-10-CM | POA: Insufficient documentation

## 2020-09-01 DIAGNOSIS — M961 Postlaminectomy syndrome, not elsewhere classified: Secondary | ICD-10-CM | POA: Diagnosis not present

## 2020-09-01 DIAGNOSIS — M5416 Radiculopathy, lumbar region: Secondary | ICD-10-CM | POA: Insufficient documentation

## 2020-09-01 DIAGNOSIS — Z79899 Other long term (current) drug therapy: Secondary | ICD-10-CM | POA: Insufficient documentation

## 2020-09-01 DIAGNOSIS — M797 Fibromyalgia: Secondary | ICD-10-CM | POA: Insufficient documentation

## 2020-09-01 DIAGNOSIS — G8929 Other chronic pain: Secondary | ICD-10-CM

## 2020-09-01 DIAGNOSIS — M79604 Pain in right leg: Secondary | ICD-10-CM | POA: Diagnosis present

## 2020-09-01 DIAGNOSIS — G35 Multiple sclerosis: Secondary | ICD-10-CM | POA: Insufficient documentation

## 2020-09-01 MED ORDER — HYDROCODONE-ACETAMINOPHEN 5-325 MG PO TABS: 1.0000 | ORAL_TABLET | Freq: Four times a day (QID) | ORAL | 0 refills | Status: DC | PRN

## 2020-09-01 MED ORDER — MORPHINE SULFATE ER 30 MG PO TBCR: 30.0000 mg | EXTENDED_RELEASE_TABLET | Freq: Three times a day (TID) | ORAL | 0 refills | Status: DC | PRN

## 2020-09-01 NOTE — Patient Instructions (Signed)
____________________________________________________________________________________________  Medication Rules  Purpose: To inform patients, and their family members, of our rules and regulations.  Applies to: All patients receiving prescriptions (written or electronic).  Pharmacy of record: Pharmacy where electronic prescriptions will be sent. If written prescriptions are taken to a different pharmacy, please inform the nursing staff. The pharmacy listed in the electronic medical record should be the one where you would like electronic prescriptions to be sent.  Electronic prescriptions: In compliance with the [place] Strengthen Opioid Misuse Prevention (STOP) Act of 2017 (Session Law 2017-74/H243), effective January 09, 2018, all controlled substances must be electronically prescribed. Calling prescriptions to the pharmacy will cease to exist.  Prescription refills: Only during scheduled appointments. Applies to all prescriptions.  NOTE: The following applies primarily to controlled substances (Opioid* Pain Medications).   Type of encounter (visit): For patients receiving controlled substances, face-to-face visits are required. (Not an option or up to the patient.)  Patient's responsibilities: Pain Pills: Bring all pain pills to every appointment (except for procedure appointments). Pill Bottles: Bring pills in original pharmacy bottle. Always bring the newest bottle. Bring bottle, even if empty. Medication refills: You are responsible for knowing and keeping track of what medications you take and those you need refilled. The day before your appointment: write a list of all prescriptions that need to be refilled. The day of the appointment: give the list to the admitting nurse. Prescriptions will be written only during appointments. No prescriptions will be written on procedure days. If you forget a medication: it will not be "Called in", "Faxed", or "electronically sent". You will  need to get another appointment to get these prescribed. No early refills. Do not call asking to have your prescription filled early. Prescription Accuracy: You are responsible for carefully inspecting your prescriptions before leaving our office. Have the discharge nurse carefully go over each prescription with you, before taking them home. Make sure that your name is accurately spelled, that your address is correct. Check the name and dose of your medication to make sure it is accurate. Check the number of pills, and the written instructions to make sure they are clear and accurate. Make sure that you are given enough medication to last until your next medication refill appointment. Taking Medication: Take medication as prescribed. When it comes to controlled substances, taking less pills or less frequently than prescribed is permitted and encouraged. Never take more pills than instructed. Never take medication more frequently than prescribed.  Inform other Doctors: Always inform, all of your healthcare providers, of all the medications you take. Pain Medication from other Providers: You are not allowed to accept any additional pain medication from any other Doctor or Healthcare provider. There are two exceptions to this rule. (see below) In the event that you require additional pain medication, you are responsible for notifying us, as stated below. Cough Medicine: Often these contain an opioid, such as codeine or hydrocodone. Never accept or take cough medicine containing these opioids if you are already taking an opioid* medication. The combination may cause respiratory failure and death. Medication Agreement: You are responsible for carefully reading and following our Medication Agreement. This must be signed before receiving any prescriptions from our practice. Safely store a copy of your signed Agreement. Violations to the Agreement will result in no further prescriptions. (Additional copies of our  Medication Agreement are available upon request.) Laws, Rules, & Regulations: All patients are expected to follow all Federal and State Laws, Statutes, Rules, & Regulations. Ignorance of   the Laws does not constitute a valid excuse.  Illegal drugs and Controlled Substances: The use of illegal substances (including, but not limited to marijuana and its derivatives) and/or the illegal use of any controlled substances is strictly prohibited. Violation of this rule may result in the immediate and permanent discontinuation of any and all prescriptions being written by our practice. The use of any illegal substances is prohibited. Adopted CDC guidelines & recommendations: Target dosing levels will be at or below 60 MME/day. Use of benzodiazepines** is not recommended.  Exceptions: There are only two exceptions to the rule of not receiving pain medications from other Healthcare Providers. Exception #1 (Emergencies): In the event of an emergency (i.e.: accident requiring emergency care), you are allowed to receive additional pain medication. However, you are responsible for: As soon as you are able, call our office [phone], at any time of the day or night, and leave a message stating your name, the date and nature of the emergency, and the name and dose of the medication prescribed. In the event that your call is answered by a member of our staff, make sure to document and save the date, time, and the name of the person that took your information.  Exception #2 (Planned Surgery): In the event that you are scheduled by another doctor or dentist to have any type of surgery or procedure, you are allowed (for a period no longer than 30 days), to receive additional pain medication, for the acute post-op pain. However, in this case, you are responsible for picking up a copy of our "Post-op Pain Management for Surgeons" handout, and giving it to your surgeon or dentist. This document is available at our office, and  does not require an appointment to obtain it. Simply go to our office during business hours (Monday-Thursday from 8:00 AM to 4:00 PM) (Friday 8:00 AM to 12:00 Noon) or if you have a scheduled appointment with us, prior to your surgery, and ask for it by name. In addition, you are responsible for: calling our office [phone], at any time of the day or night, and leaving a message stating your name, name of your surgeon, type of surgery, and date of procedure or surgery. Failure to comply with your responsibilities may result in termination of therapy involving the controlled substances.  *Opioid medications include: morphine, codeine, oxycodone, oxymorphone, hydrocodone, hydromorphone, meperidine, tramadol, tapentadol, buprenorphine, fentanyl, methadone. **Benzodiazepine medications include: diazepam (Valium), alprazolam (Xanax), clonazepam (Klonopine), lorazepam (Ativan), clorazepate (Tranxene), chlordiazepoxide (Librium), estazolam (Prosom), oxazepam (Serax), temazepam (Restoril), triazolam (Halcion) (Last updated: 12/08/2019) ____________________________________________________________________________________________  ____________________________________________________________________________________________  Medication Recommendations and Reminders  Applies to: All patients receiving prescriptions (written and/or electronic).  Medication Rules & Regulations: These rules and regulations exist for your safety and that of others. They are not flexible and neither are we. Dismissing or ignoring them will be considered "non-compliance" with medication therapy, resulting in complete and irreversible termination of such therapy. (See document titled "Medication Rules" for more details.) In all conscience, because of safety reasons, we cannot continue providing a therapy where the patient does not follow instructions.  Pharmacy of record:  Definition: This is the pharmacy where your electronic  prescriptions will be sent.  We do not endorse any particular pharmacy, however, we have experienced problems with [facility] not securing enough medication supply for the community. We do not restrict you in your choice of pharmacy. However, once we write for your prescriptions, we will NOT be re-sending more prescriptions to fix restricted supply problems   created by your pharmacy, or your insurance.  The pharmacy listed in the electronic medical record should be the one where you want electronic prescriptions to be sent. If you choose to change pharmacy, simply notify our nursing staff.  Recommendations: Keep all of your pain medications in a safe place, under lock and key, even if you live alone. We will NOT replace lost, stolen, or damaged medication. After you fill your prescription, take 1 week's worth of pills and put them away in a safe place. You should keep a separate, properly labeled bottle for this purpose. The remainder should be kept in the original bottle. Use this as your primary supply, until it runs out. Once it's gone, then you know that you have 1 week's worth of medicine, and it is time to come in for a prescription refill. If you do this correctly, it is unlikely that you will ever run out of medicine. To make sure that the above recommendation works, it is very important that you make sure your medication refill appointments are scheduled at least 1 week before you run out of medicine. To do this in an effective manner, make sure that you do not leave the office without scheduling your next medication management appointment. Always ask the nursing staff to show you in your prescription , when your medication will be running out. Then arrange for the receptionist to get you a return appointment, at least 7 days before you run out of medicine. Do not wait until you have 1 or 2 pills left, to come in. This is very poor planning and does not take into consideration that we may need to  cancel appointments due to bad weather, sickness, or emergencies affecting our staff. DO NOT ACCEPT A "Partial Fill": If for any reason your pharmacy does not have enough pills/tablets to completely fill or refill your prescription, do not allow for a "partial fill". The law allows the pharmacy to complete that prescription within 72 hours, without requiring a new prescription. If they do not fill the rest of your prescription within those 72 hours, you will need a separate prescription to fill the remaining amount, which we will NOT provide. If the reason for the partial fill is your insurance, you will need to talk to the pharmacist about payment alternatives for the remaining tablets, but again, DO NOT ACCEPT A PARTIAL FILL, unless you can trust your pharmacist to obtain the remainder of the pills within 72 hours.  Prescription refills and/or changes in medication(s):  Prescription refills, and/or changes in dose or medication, will be conducted only during scheduled medication management appointments. (Applies to both, written and electronic prescriptions.) No refills on procedure days. No medication will be changed or started on procedure days. No changes, adjustments, and/or refills will be conducted on a procedure day. Doing so will interfere with the diagnostic portion of the procedure. No phone refills. No medications will be "called into the pharmacy". No Fax refills. No weekend refills. No Holliday refills. No after hours refills.  Remember:  Business hours are:  Monday to Thursday 8:00 AM to 4:00 PM Provider's Schedule: Kamran Coker, MD - Appointments are:  Medication management: Monday and Wednesday 8:00 AM to 4:00 PM Procedure day: Tuesday and Thursday 7:30 AM to 4:00 PM Bilal Lateef, MD - Appointments are:  Medication management: Tuesday and Thursday 8:00 AM to 4:00 PM Procedure day: Monday and Wednesday 7:30 AM to 4:00 PM (Last update:  07/30/2019) ____________________________________________________________________________________________  ____________________________________________________________________________________________  CBD (cannabidiol) WARNING    Applicable to: All individuals currently taking or considering taking CBD (cannabidiol) and, more important, all patients taking opioid analgesic controlled substances (pain medication). (Example: oxycodone; oxymorphone; hydrocodone; hydromorphone; morphine; methadone; tramadol; tapentadol; fentanyl; buprenorphine; butorphanol; dextromethorphan; meperidine; codeine; etc.)  Legal status: CBD remains a Schedule I drug prohibited for any use. CBD is illegal with one exception. In the [place], CBD has a limited Food and Drug Administration (FDA) approval for the treatment of two specific types of epilepsy disorders. Only one CBD product has been approved by the FDA for this purpose: "Epidiolex". FDA is aware that some companies are marketing products containing cannabis and cannabis-derived compounds in ways that violate the Federal Food, Drug and Cosmetic Act (FD&C Act) and that may put the health and safety of consumers at risk. The FDA, a Federal agency, has not enforced the CBD status since 2018.   Legality: Some manufacturers ship CBD products nationally, which is illegal. Often such products are sold online and are therefore available throughout the country. CBD is openly sold in head shops and health food stores in some states where such sales have not been explicitly legalized. Selling unapproved products with unsubstantiated therapeutic claims is not only a violation of the law, but also can put patients at risk, as these products have not been proven to be safe or effective. Federal illegality makes it difficult to conduct research on CBD.  Reference: "FDA Regulation of Cannabis and Cannabis-Derived Products, Including Cannabidiol (CBD)" -  https://www.fda.gov/news-events/public-health-focus/fda-regulation-cannabis-and-cannabis-derived-products-including-cannabidiol-cbd  Warning: CBD is not FDA approved and has not undergo the same manufacturing controls as prescription drugs.  This means that the purity and safety of available CBD may be questionable. Most of the time, despite manufacturer's claims, it is contaminated with THC (delta-9-tetrahydrocannabinol - the chemical in marijuana responsible for the "HIGH").  When this is the case, the THC contaminant will trigger a positive urine drug screen (UDS) test for Marijuana (carboxy-THC). Because a positive UDS for any illicit substance is a violation of our medication agreement, your opioid analgesics (pain medicine) may be permanently discontinued.  MORE ABOUT CBD  General Information: CBD  is a derivative of the Marijuana (cannabis sativa) plant discovered in 1940. It is one of the 113 identified substances found in Marijuana. It accounts for up to 40% of the plant's extract. As of 2018, preliminary clinical studies on CBD included research for the treatment of anxiety, movement disorders, and pain. CBD is available and consumed in multiple forms, including inhalation of smoke or vapor, as an aerosol spray, and by mouth. It may be supplied as an oil containing CBD, capsules, dried cannabis, or as a liquid solution. CBD is thought not to be as psychoactive as THC (delta-9-tetrahydrocannabinol - the chemical in marijuana responsible for the "HIGH"). Studies suggest that CBD may interact with different biological target receptors in the body, including cannabinoid and other neurotransmitter receptors. As of 2018 the mechanism of action for its biological effects has not been determined.  Side-effects  Adverse reactions: Dry mouth, diarrhea, decreased appetite, fatigue, drowsiness, malaise, weakness, sleep disturbances, and others.  Drug interactions: CBC may interact with other medications  such as blood-thinners. (Last update: 08/16/2019) ____________________________________________________________________________________________  ____________________________________________________________________________________________  Drug Holidays (Slow)  What is a "Drug Holiday"? Drug Holiday: is the name given to the period of time during which a patient stops taking a medication(s) for the purpose of eliminating tolerance to the drug.  Benefits Improved effectiveness of opioids. Decreased opioid dose needed to achieve benefits. Improved pain with lesser dose.    What is tolerance? Tolerance: is the progressive decreased in effectiveness of a drug due to its repetitive use. With repetitive use, the body gets use to the medication and as a consequence, it loses its effectiveness. This is a common problem seen with opioid pain medications. As a result, a larger dose of the drug is needed to achieve the same effect that used to be obtained with a smaller dose.  How long should a "Drug Holiday" last? You should stay off of the pain medicine for at least 14 consecutive days. (2 weeks)  Should I stop the medicine "cold turkey"? No. You should always coordinate with your Pain Specialist so that he/she can provide you with the correct medication dose to make the transition as smoothly as possible.  How do I stop the medicine? Slowly. You will be instructed to decrease the daily amount of pills that you take by one (1) pill every seven (7) days. This is called a "slow downward taper" of your dose. For example: if you normally take four (4) pills per day, you will be asked to drop this dose to three (3) pills per day for seven (7) days, then to two (2) pills per day for seven (7) days, then to one (1) per day for seven (7) days, and at the end of those last seven (7) days, this is when the "Drug Holiday" would start.   Will I have withdrawals? By doing a "slow downward taper" like this one, it  is unlikely that you will experience any significant withdrawal symptoms. Typically, what triggers withdrawals is the sudden stop of a high dose opioid therapy. Withdrawals can usually be avoided by slowly decreasing the dose over a prolonged period of time. If you do not follow these instructions and decide to stop your medication abruptly, withdrawals may be possible.  What are withdrawals? Withdrawals: refers to the wide range of symptoms that occur after stopping or dramatically reducing opiate drugs after heavy and prolonged use. Withdrawal symptoms do not occur to patients that use low dose opioids, or those who take the medication sporadically. Contrary to benzodiazepine (example: Valium, Xanax, etc.) or alcohol withdrawals ("Delirium Tremens"), opioid withdrawals are not lethal. Withdrawals are the physical manifestation of the body getting rid of the excess receptors.  Expected Symptoms Early symptoms of withdrawal may include: Agitation Anxiety Muscle aches Increased tearing Insomnia Runny nose Sweating Yawning  Late symptoms of withdrawal may include: Abdominal cramping Diarrhea Dilated pupils Goose bumps Nausea Vomiting  Will I experience withdrawals? Due to the slow nature of the taper, it is very unlikely that you will experience any.  What is a slow taper? Taper: refers to the gradual decrease in dose.  (Last update: 07/30/2019) ____________________________________________________________________________________________    

## 2020-09-01 NOTE — Progress Notes (Signed)
Nursing Pain Medication Assessment:  Safety precautions to be maintained throughout the outpatient stay will include: orient to surroundings, keep bed in low position, maintain call bell within reach at all times, provide assistance with transfer out of bed and ambulation.  Medication Inspection Compliance: Pill count conducted under aseptic conditions, in front of the patient. Neither the pills nor the bottle was removed from the patient's sight at any time. Once count was completed pills were immediately returned to the patient in their original bottle.  Medication #1: Hydrocodone/APAP Pill/Patch Count:  19 of 120 pills remain Pill/Patch Appearance: Markings consistent with prescribed medication Bottle Appearance: Standard pharmacy container. Clearly labeled. Filled Date: 07 / 28 / 2022 Last Medication intake:  Today  Medication #2: Morphine IR Pill/Patch Count:  14 of 90 pills remain Pill/Patch Appearance: Markings consistent with prescribed medication Bottle Appearance: Standard pharmacy container. Clearly labeled. Filled Date: 07 / 28 / 2024 Last Medication intake:  Today

## 2020-12-01 ENCOUNTER — Ambulatory Visit: Payer: Medicare HMO | Attending: Pain Medicine | Admitting: Pain Medicine

## 2020-12-01 ENCOUNTER — Encounter: Payer: Self-pay | Admitting: Pain Medicine

## 2020-12-01 ENCOUNTER — Telehealth: Payer: Self-pay | Admitting: *Deleted

## 2020-12-01 ENCOUNTER — Other Ambulatory Visit: Payer: Self-pay

## 2020-12-01 VITALS — BP 120/65 | HR 86 | Temp 97.3°F | Resp 16 | Ht 62.0 in | Wt 300.0 lb

## 2020-12-01 DIAGNOSIS — M159 Polyosteoarthritis, unspecified: Secondary | ICD-10-CM | POA: Insufficient documentation

## 2020-12-01 DIAGNOSIS — M47816 Spondylosis without myelopathy or radiculopathy, lumbar region: Secondary | ICD-10-CM | POA: Diagnosis present

## 2020-12-01 DIAGNOSIS — Z79891 Long term (current) use of opiate analgesic: Secondary | ICD-10-CM | POA: Diagnosis present

## 2020-12-01 DIAGNOSIS — M797 Fibromyalgia: Secondary | ICD-10-CM | POA: Diagnosis present

## 2020-12-01 DIAGNOSIS — M533 Sacrococcygeal disorders, not elsewhere classified: Secondary | ICD-10-CM | POA: Diagnosis present

## 2020-12-01 DIAGNOSIS — M961 Postlaminectomy syndrome, not elsewhere classified: Secondary | ICD-10-CM | POA: Insufficient documentation

## 2020-12-01 DIAGNOSIS — M5137 Other intervertebral disc degeneration, lumbosacral region: Secondary | ICD-10-CM | POA: Diagnosis present

## 2020-12-01 DIAGNOSIS — G35 Multiple sclerosis: Secondary | ICD-10-CM | POA: Insufficient documentation

## 2020-12-01 DIAGNOSIS — M25552 Pain in left hip: Secondary | ICD-10-CM | POA: Diagnosis present

## 2020-12-01 DIAGNOSIS — G894 Chronic pain syndrome: Secondary | ICD-10-CM | POA: Diagnosis not present

## 2020-12-01 DIAGNOSIS — Z79899 Other long term (current) drug therapy: Secondary | ICD-10-CM | POA: Diagnosis present

## 2020-12-01 DIAGNOSIS — M79604 Pain in right leg: Secondary | ICD-10-CM | POA: Insufficient documentation

## 2020-12-01 DIAGNOSIS — M5441 Lumbago with sciatica, right side: Secondary | ICD-10-CM | POA: Insufficient documentation

## 2020-12-01 DIAGNOSIS — G8929 Other chronic pain: Secondary | ICD-10-CM | POA: Insufficient documentation

## 2020-12-01 MED ORDER — MORPHINE SULFATE ER 30 MG PO TBCR: 30.0000 mg | EXTENDED_RELEASE_TABLET | Freq: Three times a day (TID) | ORAL | 0 refills | Status: DC | PRN

## 2020-12-01 MED ORDER — OXYCODONE HCL 5 MG PO TABS: 5.0000 mg | ORAL_TABLET | Freq: Three times a day (TID) | ORAL | 0 refills | Status: DC

## 2020-12-01 MED ORDER — OXYCODONE HCL 5 MG PO TABS: 5.0000 mg | ORAL_TABLET | Freq: Four times a day (QID) | ORAL | 0 refills | Status: DC

## 2020-12-01 MED ORDER — OXYCODONE HCL 5 MG PO TABS: 5.0000 mg | ORAL_TABLET | Freq: Every day | ORAL | 0 refills | Status: DC

## 2020-12-01 MED ORDER — HYDROCODONE-ACETAMINOPHEN 5-325 MG PO TABS: 1.0000 | ORAL_TABLET | Freq: Four times a day (QID) | ORAL | 0 refills | Status: DC | PRN

## 2020-12-01 MED ORDER — MORPHINE SULFATE ER 15 MG PO TBCR: 15.0000 mg | EXTENDED_RELEASE_TABLET | Freq: Two times a day (BID) | ORAL | 0 refills | Status: DC

## 2020-12-01 MED ORDER — OXYCODONE HCL 5 MG PO TABS: 5.0000 mg | ORAL_TABLET | Freq: Two times a day (BID) | ORAL | 0 refills | Status: DC

## 2020-12-01 MED ORDER — MORPHINE SULFATE ER 15 MG PO TBCR: 15.0000 mg | EXTENDED_RELEASE_TABLET | Freq: Three times a day (TID) | ORAL | 0 refills | Status: DC

## 2020-12-01 NOTE — Telephone Encounter (Signed)
Called to [facility] to cancel Rx's for hydro - apap and MS contin that were sent in today for 3 months.  All 6 have been noted to be cancelled by [facility].

## 2020-12-01 NOTE — Progress Notes (Signed)
PROVIDER NOTE: Information contained herein reflects review and annotations entered in association with encounter. Interpretation of such information and data should be left to medically-trained personnel. Information provided to patient can be located elsewhere in the medical record under "Patient Instructions". Document created using STT-dictation technology, any transcriptional errors that may result from process are unintentional.    Patient: Tiffany Herring  Service Category: E/M  Provider: Gaspar Cola, MD  DOB: 10/23/1960  DOS: 12/01/2020  Specialty: Interventional Pain Management  MRN: [id number]  Setting: Ambulatory outpatient  PCP: Hortencia Pilar, MD  Type: Established Patient    Referring Provider: Hortencia Pilar, MD  Location: Office  Delivery: Face-to-face     HPI  Tiffany Herring, a 60 y.o. year old female, is here today because of her Chronic pain syndrome [G89.4]. Ms. Molchan primary complain today is Hip Pain (left) Last encounter: My last encounter with her was on 09/01/2020. Pertinent problems: Ms. Ghosh has Failed back surgical syndrome; Epidural fibrosis; Chronic low back pain (Bilateral) w/ sciatica (Right); Chronic lumbar radicular pain (Right); Generalized OA; Multiple sclerosis ([place]); Degenerative arthritis of hip; Fibromyalgia; Lumbar spondylosis; Grade 1 Anterolisthesis of L4/L5; Chronic sacroiliac joint pain (bilateral); Chronic hip pain (Left); Arthropathy of left hip (Severe chronic left hip DJD); Osteoarthritis of hip (Left); Lumbar facet arthropathy ([place]); Lumbar facet hypertrophy; Lumbar facet syndrome (Bilateral); Chronic flank pain (Right); Chronic lower extremity pain (Right); Chronic pain syndrome; Venous stasis ulcer ([place]); OA (osteoarthritis); Osteoarthrosis, hip; Generalized osteoarthritis of multiple sites; and DDD (degenerative disc disease), lumbosacral on their pertinent problem list. Pain Assessment: Severity of Chronic pain is reported as a 3  /10. Location: Hip Left/radiates into left knee on the side. Onset: More than a month ago. Quality: [place], [place]. Timing: Constant. Modifying factor(s): meds. Vitals:  height is 5' 2"  (1.575 m) and weight is 300 lb (136.1 kg). Her temperature is 97.3 F (36.3 C) (abnormal). Her blood pressure is 120/65 and her pulse is 86. Her respiration is 16 and oxygen saturation is 97%.   Reason for encounter: medication management.   The patient indicates doing well with the current medication regimen. No adverse reactions or side effects reported to the medications.  However, the patient indicates that she has been having problems with renal impairment and has noticed that some of the medications are beginning to accumulate in the system were she is not feel like she needs them as she used to.  She is interested in lowering the dose and perhaps doing a drug holiday.  Today we will begin that process.  RTCB: 03/03/2021  Pharmacotherapy Assessment  Analgesic: Morphine ER 30 mg, 1 tab PO q 8 hrs (90 mg/day) + hydrocodone/APAP 5/325, 1 tab PO q 6 hrs (20 mg/day of hydrocodone) MME/day: 110 mg/day.   Monitoring: [place] PMP: PDMP reviewed during this encounter.       Pharmacotherapy: No side-effects or adverse reactions reported. Compliance: No problems identified. Effectiveness: Clinically acceptable.  Dewayne Shorter, RN  12/01/2020  1:01 PM  Sign when Signing Visit Nursing Pain Medication Assessment:  Safety precautions to be maintained throughout the outpatient stay will include: orient to surroundings, keep bed in low position, maintain call bell within reach at all times, provide assistance with transfer out of bed and ambulation.  Medication Inspection Compliance: Pill count conducted under aseptic conditions, in front of the patient. Neither the pills nor the bottle was removed from the patient's sight at any time. Once count was completed pills were immediately returned to  the patient in their original  bottle.  Medication: Morphine ER (MSContin) Pill/Patch Count:  11 of 90 pills remain Pill/Patch Appearance: Markings consistent with prescribed medication Bottle Appearance: Standard pharmacy container. Clearly labeled. Filled Date: 86 / 26 / 2022 Last Medication intake:  Today  Hydrocodone 15/120 Filled 11-03-2020 last took today    UDS:  Summary  Date Value Ref Range Status  05/27/2020 Note  Final    Comment:    ==================================================================== [employer] (MW) ==================================================================== Test                             Result       Flag       Units  Drug Present and Declared for Prescription Verification   Morphine                       >7692        EXPECTED   ng/mg creat   Normorphine                    263          EXPECTED   ng/mg creat    Potential sources of large amounts of morphine in the absence of    codeine include administration of morphine or use of heroin.     Normorphine is an expected metabolite of morphine.    Hydrocodone                    778          EXPECTED   ng/mg creat   Hydromorphone                  155          EXPECTED   ng/mg creat   Dihydrocodeine                 55           EXPECTED   ng/mg creat   Norhydrocodone                 952          EXPECTED   ng/mg creat    Sources of hydrocodone include scheduled prescription medications.    Hydromorphone, dihydrocodeine and norhydrocodone are expected    metabolites of hydrocodone. Hydromorphone and dihydrocodeine are    also available as scheduled prescription medications.  ==================================================================== Test                      Result    Flag   Units      Ref Range   Creatinine              130              mg/dL      >=20 ==================================================================== Declared Medications:  The flagging and interpretation on this report are based  on the  following declared medications.  Unexpected results may arise from  inaccuracies in the declared medications.   **Note: The testing scope of this panel includes these medications:   Hydrocodone (Norco)  Morphine (MS Contin)   **Note: The testing scope of this panel does not include the  following reported medications:   Acetaminophen (Norco)  Insulin  Mirtazapine (Remeron)  Montelukast (Singulair)  Naloxone (Narcan)  Oxybutynin (Ditropan)  Tizanidine (Zanaflex) ==================================================================== For clinical consultation, please call [phone]. ====================================================================      ROS  Constitutional: Denies any fever or chills Gastrointestinal: No reported hemesis, hematochezia, vomiting, or acute GI distress Musculoskeletal: Denies any acute onset joint swelling, redness, loss of ROM, or weakness Neurological: No reported episodes of acute onset apraxia, aphasia, dysarthria, agnosia, amnesia, paralysis, loss of coordination, or loss of consciousness  Medication Review  BLOOD GLUCOSE TEST STRIPS, Fifty50 Glucose Meter 2.0, Insulin Syringe-Needle U-100, accu-chek multiclix, albuterol, aspirin, buPROPion, clotrimazole, enalapril, furosemide, gabapentin, glucose blood, insulin regular, mirtazapine, montelukast, morphine, mupirocin ointment, naloxone, oxyCODONE, oxybutynin, tiZANidine, timolol, and vitamin B-12  History Review  Allergy: Ms. Liptak is allergic to amoxicillin-pot clavulanate, copaxone  [glatiramer acetate], pseudoephedrine, rofecoxib, sulfa antibiotics, sulfamethoxazole-trimethoprim, and meloxicam. Drug: Ms. Warren  reports no history of drug use. Alcohol:  reports no history of alcohol use. Tobacco:  reports that she has quit smoking. She has never used smokeless tobacco. Social: Ms. Nienow  reports that she has quit smoking. She has never used smokeless tobacco. She reports  that she does not drink alcohol and does not use drugs. Medical:  has a past medical history of Allergy, Arthritis, Asthma, Chronic kidney disease, Depression, Diabetes mellitus without complication ([place]), Hypertension, Multiple sclerosis ([place]), Neuromuscular disorder ([place]), and Sleep apnea. Surgical: Ms. Searing  has a past surgical history that includes Cesarean section (1991 and 1996). Family: family history includes Dementia in her mother; Depression in her mother; Diabetes in her mother; Hyperlipidemia in her mother; Stroke in her father.  Laboratory Chemistry Profile   Renal Lab Results  Component Value Date   BUN 18 02/06/2018   CREATININE 1.22 (H) 02/06/2018   BCR 15 02/06/2018   GFRAA 56 (L) 02/06/2018   GFRNONAA 49 (L) 02/06/2018    Hepatic Lab Results  Component Value Date   AST 25 02/06/2018   ALT 19 02/15/2015   ALBUMIN 4.6 02/06/2018   ALKPHOS 97 02/06/2018    Electrolytes Lab Results  Component Value Date   NA 136 02/06/2018   K 4.9 02/06/2018   CL 95 (L) 02/06/2018   CALCIUM 9.8 02/06/2018   MG 1.8 02/06/2018    Bone Lab Results  Component Value Date   25OHVITD1 55 02/06/2018   25OHVITD2 2.9 02/06/2018   25OHVITD3 52 02/06/2018    Inflammation (CRP: Acute Phase) (ESR: Chronic Phase) Lab Results  Component Value Date   CRP 17 (H) 02/06/2018   ESRSEDRATE 28 02/06/2018         Note: Above Lab results reviewed.  Recent Imaging Review  US Abdomen Complete * PRIOR REPORT IMPORTED FROM AN EXTERNAL SYSTEM *   PRIOR REPORT IMPORTED FROM THE [facility] WORKFLOW SYSTEM   REASON FOR EXAM:    Enlarged Liver Spleen  COMMENTS:   PROCEDURE:     [place]  - US ABDOMEN GENERAL SURVEY  - Jul 29 2012 10:20AM   RESULT:     The liver is enlarged. It demonstrates increased echo texture  consistent with fatty infiltration. Portal venous flow is normal in  direction toward the liver. There is splenomegaly with maximal dimension  of  the spleen of 16.2 cm.   The  gallbladder is adequately distended with no evidence of stones, wall  thickening, or pericholecystic fluid. There is no positive sonographic  Murphy's sign. The common bile duct measures 5.5 mm in diameter.   Evaluation the pancreas is limited by bowel gas. Similarly the abdominal  aorta is only partially visualized. The inferior vena cava and kidneys are  normal in appearance.  IMPRESSION:  1. There is hepatosplenomegaly.  2. The gallbladder and common bile duct and observed portions of the  pancreas appear normal.  3. The kidneys exhibit no evidence of obstruction nor other acute  abnormality.   Dictation Site: 2    Note: Reviewed        Physical Exam  General appearance: Well nourished, well developed, and well hydrated. In no apparent acute distress Mental status: Alert, oriented x 3 (person, place, & time)       Respiratory: No evidence of acute respiratory distress Eyes: PERLA Vitals: BP 120/65 (BP Location: Left Arm, Patient Position: Sitting, Cuff Size: Large)   Pulse 86   Temp (!) 97.3 F (36.3 C)   Resp 16   Ht 5' 2"  (1.575 m)   Wt 300 lb (136.1 kg)   SpO2 97%   BMI 54.87 kg/m  BMI: Estimated body mass index is 54.87 kg/m as calculated from the following:   Height as of this encounter: 5' 2"  (1.575 m).   Weight as of this encounter: 300 lb (136.1 kg). Ideal: Ideal body weight: 50.1 kg (110 lb 7.2 oz) Adjusted ideal body weight: 84.5 kg (186 lb 4.3 oz)  Assessment   Status Diagnosis  Controlled Controlled Controlled 1. Chronic pain syndrome   2. Multiple sclerosis ([place])   3. Failed back surgical syndrome   4. Chronic low back pain (Bilateral) w/ sciatica (Right)   5. DDD (degenerative disc disease), lumbosacral   6. Lumbar facet syndrome (Bilateral)   7. Chronic lower extremity pain (Right)   8. Chronic hip pain (Left)   9. Chronic sacroiliac joint pain (bilateral)   10. Fibromyalgia   11. Generalized osteoarthritis of multiple sites   12.  Pharmacologic therapy   13. Chronic use of opiate for therapeutic purpose      Updated Problems: Problem  Ddd (Degenerative Disc Disease), Lumbosacral  Generalized Osteoarthritis of Multiple Sites    Plan of Care  Problem-specific:  No problem-specific Assessment & Plan notes found for this encounter.  Ms. Mandee Pluta has a current medication list which includes the following long-term medication(s): albuterol, bupropion, furosemide, gabapentin, insulin regular, mirtazapine, montelukast, morphine, [START ON 12/29/2020] morphine, oxycodone, [START ON 12/08/2020] oxycodone, [START ON 12/15/2020] oxycodone, [START ON 12/29/2020] oxycodone, [START ON 01/05/2021] oxycodone, [START ON 01/19/2021] oxycodone, [START ON 01/26/2021] oxycodone, [START ON 02/02/2021] oxycodone, and [START ON 02/09/2021] oxycodone.  Pharmacotherapy (Medications Ordered): Meds ordered this encounter  Medications   DISCONTD: HYDROcodone-acetaminophen (NORCO/VICODIN) 5-325 MG tablet    Sig: Take 1 tablet by mouth every 6 (six) hours as needed for severe pain. Must last 30 days    Dispense:  120 tablet    Refill:  0    DO NOT: delete (not duplicate); no partial-fill (will deny script to complete), no refill request (F/U required). DISPENSE: 1 day early if closed on fill date. WARN: No CNS-depressants within 8 hrs of med.   DISCONTD: HYDROcodone-acetaminophen (NORCO/VICODIN) 5-325 MG tablet    Sig: Take 1 tablet by mouth every 6 (six) hours as needed for severe pain. Must last 30 days    Dispense:  120 tablet    Refill:  0    DO NOT: delete (not duplicate); no partial-fill (will deny script to complete), no refill request (F/U required). DISPENSE: 1 day early if closed on fill date. WARN: No CNS-depressants within 8 hrs of med.   DISCONTD: HYDROcodone-acetaminophen (NORCO/VICODIN) 5-325 MG tablet    Sig: Take 1 tablet by mouth every 6 (six)  hours as needed for severe pain. Must last 30 days    Dispense:  120 tablet     Refill:  0    DO NOT: delete (not duplicate); no partial-fill (will deny script to complete), no refill request (F/U required). DISPENSE: 1 day early if closed on fill date. WARN: No CNS-depressants within 8 hrs of med.   DISCONTD: morphine (MS CONTIN) 30 MG 12 hr tablet    Sig: Take 1 tablet (30 mg total) by mouth every 8 (eight) hours as needed for pain. Must last 30 days    Dispense:  90 tablet    Refill:  0    DO NOT: delete (not duplicate); no partial-fill (will deny script to complete), no refill request (F/U required). DISPENSE: 1 day early if closed on fill date. WARN: No CNS-depressants within 8 hrs of med.   DISCONTD: morphine (MS CONTIN) 30 MG 12 hr tablet    Sig: Take 1 tablet (30 mg total) by mouth every 8 (eight) hours as needed for pain. Must last 30 days    Dispense:  90 tablet    Refill:  0    DO NOT: delete (not duplicate); no partial-fill (will deny script to complete), no refill request (F/U required). DISPENSE: 1 day early if closed on fill date. WARN: No CNS-depressants within 8 hrs of med.   DISCONTD: morphine (MS CONTIN) 30 MG 12 hr tablet    Sig: Take 1 tablet (30 mg total) by mouth every 8 (eight) hours as needed for pain. Must last 30 days    Dispense:  90 tablet    Refill:  0    DO NOT: delete (not duplicate); no partial-fill (will deny script to complete), no refill request (F/U required). DISPENSE: 1 day early if closed on fill date. WARN: No CNS-depressants within 8 hrs of med.   morphine (MS CONTIN) 15 MG 12 hr tablet    Sig: Take 1 tablet (15 mg total) by mouth in the morning, at noon, and at bedtime for 28 days. Must last 30 days. Do not break tablet    Dispense:  84 tablet    Refill:  0    DO NOT: delete (not duplicate); no partial-fill (will deny script to complete), no refill request (F/U required). DISPENSE: 1 day early if closed on fill date. WARN: No CNS-depressants within 8 hrs of med.   morphine (MS CONTIN) 15 MG 12 hr tablet    Sig: Take 1 tablet (15  mg total) by mouth every 12 (twelve) hours for 21 days. Must last 30 days. Do not break tablet    Dispense:  42 tablet    Refill:  0    DO NOT: delete (not duplicate); no partial-fill (will deny script to complete), no refill request (F/U required). DISPENSE: 1 day early if closed on fill date. WARN: No CNS-depressants within 8 hrs of med.   oxyCODONE (OXY IR/ROXICODONE) 5 MG immediate release tablet    Sig: Take 1 tablet (5 mg total) by mouth 3 (three) times daily for 7 days. Max: 3/day. Must last 7 days.    Dispense:  21 tablet    Refill:  0    Prescription is part of a downward taper. Fill prescriptions in the correct order. Failure to do so may trigger withdrawal syndrome. Fill date: 12/01/2020. To last until: 12/08/2020.   oxyCODONE (OXY IR/ROXICODONE) 5 MG immediate release tablet    Sig: Take 1 tablet (5 mg total) by mouth 2 (two) times daily  for 7 days. Max: 2/day. Must last 7 days.    Dispense:  14 tablet    Refill:  0    Prescription is part of a downward taper. Fill prescriptions in the correct order. Failure to do so may trigger withdrawal syndrome. Fill date: 12/08/2020. To last until: 12/15/2020.   oxyCODONE (OXY IR/ROXICODONE) 5 MG immediate release tablet    Sig: Take 1 tablet (5 mg total) by mouth daily for 7 days. Max: 1/day. Must last 7 days.    Dispense:  7 tablet    Refill:  0    Prescription is part of a downward taper. Fill prescriptions in the correct order. Failure to do so may trigger withdrawal syndrome. Fill date: 12/15/2020. To last until: 12/22/2020.   oxyCODONE (OXY IR/ROXICODONE) 5 MG immediate release tablet    Sig: Take 1 tablet (5 mg total) by mouth 2 (two) times daily for 7 days. Max: 2/day. Must last 7 days.    Dispense:  14 tablet    Refill:  0    Prescription is part of a downward taper. Fill prescriptions in the correct order. Failure to do so may trigger withdrawal syndrome. Fill date: 12/29/2020. To last until: 01/05/2021.   oxyCODONE (OXY  IR/ROXICODONE) 5 MG immediate release tablet    Sig: Take 1 tablet (5 mg total) by mouth daily for 7 days. Max: 1/day. Must last 7 days.    Dispense:  7 tablet    Refill:  0    Prescription is part of a downward taper. Fill prescriptions in the correct order. Failure to do so may trigger withdrawal syndrome. Fill date: 01/05/2021. To last until: 01/12/2021.   oxyCODONE (OXY IR/ROXICODONE) 5 MG immediate release tablet    Sig: Take 1 tablet (5 mg total) by mouth 4 (four) times daily for 7 days. Max: 4/day. Must last 7 days.    Dispense:  28 tablet    Refill:  0    Prescription is part of a downward taper. Fill prescriptions in the correct order. Failure to do so may trigger withdrawal syndrome. Fill date: 01/19/2021. To last until: 01/26/2021.   oxyCODONE (OXY IR/ROXICODONE) 5 MG immediate release tablet    Sig: Take 1 tablet (5 mg total) by mouth 3 (three) times daily for 7 days. Max: 3/day. Must last 7 days.    Dispense:  21 tablet    Refill:  0    Prescription is part of a downward taper. Fill prescriptions in the correct order. Failure to do so may trigger withdrawal syndrome. Fill date: 01/26/2021. To last until: 02/02/2021.   oxyCODONE (OXY IR/ROXICODONE) 5 MG immediate release tablet    Sig: Take 1 tablet (5 mg total) by mouth 2 (two) times daily for 7 days. Max: 2/day. Must last 7 days.    Dispense:  14 tablet    Refill:  0    Prescription is part of a downward taper. Fill prescriptions in the correct order. Failure to do so may trigger withdrawal syndrome. Fill date: 02/02/2021. To last until: 02/09/2021.   oxyCODONE (OXY IR/ROXICODONE) 5 MG immediate release tablet    Sig: Take 1 tablet (5 mg total) by mouth daily for 7 days. Max: 1/day. Must last 7 days.    Dispense:  7 tablet    Refill:  0    Prescription is part of a downward taper. Fill prescriptions in the correct order. Failure to do so may trigger withdrawal syndrome. Fill date: 02/09/2021. To last until: 02/16/2021.  Orders:  No  orders of the defined types were placed in this encounter.  Follow-up plan:   Return in about 3 months (around 03/03/2021) for Eval-day (M,W), (F2F), (MM).     Interventional therapies: Planned, scheduled, and/or pending:   Not at this time.   Considering:   None at this time.    Palliative PRN treatment(s):   None at this time.     Recent Visits No visits were found meeting these conditions. Showing recent visits within past 90 days and meeting all other requirements Today's Visits Date Type Provider Dept  12/01/20 Office Visit Milinda Pointer, MD [facility]  Showing today's visits and meeting all other requirements Future Appointments No visits were found meeting these conditions. Showing future appointments within next 90 days and meeting all other requirements I discussed the assessment and treatment plan with the patient. The patient was provided an opportunity to ask questions and all were answered. The patient agreed with the plan and demonstrated an understanding of the instructions.  Patient advised to call back or seek an in-person evaluation if the symptoms or condition worsens.  Duration of encounter: 30 minutes.  Note by: Gaspar Cola, MD Date: 12/01/2020; Time: 2:35 PM

## 2020-12-01 NOTE — Patient Instructions (Signed)

## 2020-12-01 NOTE — Progress Notes (Signed)
Nursing Pain Medication Assessment:  Safety precautions to be maintained throughout the outpatient stay will include: orient to surroundings, keep bed in low position, maintain call bell within reach at all times, provide assistance with transfer out of bed and ambulation.  Medication Inspection Compliance: Pill count conducted under aseptic conditions, in front of the patient. Neither the pills nor the bottle was removed from the patient's sight at any time. Once count was completed pills were immediately returned to the patient in their original bottle.  Medication: Morphine ER (MSContin) Pill/Patch Count:  11 of 90 pills remain Pill/Patch Appearance: Markings consistent with prescribed medication Bottle Appearance: Standard pharmacy container. Clearly labeled. Filled Date: 31 / 26 / 2022 Last Medication intake:  Today  Hydrocodone 15/120 Filled 11-03-2020 last took today

## 2020-12-06 ENCOUNTER — Telehealth: Payer: Self-pay | Admitting: Pain Medicine

## 2020-12-06 NOTE — Telephone Encounter (Signed)
Talked with pharm. And explained to him Dr Shauna Hugh plan. Pharm with understanding.

## 2020-12-06 NOTE — Telephone Encounter (Signed)
Called and talked to Pharmacist and He had questions about the drug holiday . Will need to talk with Dr Shauna Hugh about this for some clarification of the order,

## 2020-12-06 NOTE — Telephone Encounter (Signed)
Talked with Dr Shauna Hugh about this taper and he states that the taper had to do with the MEU's from this medication and Morphine. Called and talked with pahrmacist about this.

## 2020-12-06 NOTE — Telephone Encounter (Signed)
Was on hold 15 minutes for pharmacist. I did not speak with him to inform him of the Drug Holiday. Will call back later to instruct him.

## 2020-12-07 ENCOUNTER — Telehealth: Payer: Self-pay | Admitting: *Deleted

## 2020-12-07 ENCOUNTER — Telehealth: Payer: Self-pay

## 2020-12-07 ENCOUNTER — Other Ambulatory Visit: Payer: Self-pay | Admitting: Pain Medicine

## 2020-12-07 DIAGNOSIS — G894 Chronic pain syndrome: Secondary | ICD-10-CM

## 2020-12-07 DIAGNOSIS — M47816 Spondylosis without myelopathy or radiculopathy, lumbar region: Secondary | ICD-10-CM

## 2020-12-07 DIAGNOSIS — Z79891 Long term (current) use of opiate analgesic: Secondary | ICD-10-CM

## 2020-12-07 DIAGNOSIS — M797 Fibromyalgia: Secondary | ICD-10-CM

## 2020-12-07 DIAGNOSIS — G8929 Other chronic pain: Secondary | ICD-10-CM

## 2020-12-07 DIAGNOSIS — M25552 Pain in left hip: Secondary | ICD-10-CM

## 2020-12-07 DIAGNOSIS — Z79899 Other long term (current) drug therapy: Secondary | ICD-10-CM

## 2020-12-07 DIAGNOSIS — G35 Multiple sclerosis: Secondary | ICD-10-CM

## 2020-12-07 DIAGNOSIS — M961 Postlaminectomy syndrome, not elsewhere classified: Secondary | ICD-10-CM

## 2020-12-07 DIAGNOSIS — M159 Polyosteoarthritis, unspecified: Secondary | ICD-10-CM

## 2020-12-07 DIAGNOSIS — M5137 Other intervertebral disc degeneration, lumbosacral region: Secondary | ICD-10-CM

## 2020-12-07 DIAGNOSIS — M79604 Pain in right leg: Secondary | ICD-10-CM

## 2020-12-07 MED ORDER — OXYCODONE HCL 5 MG PO TABS: 5.0000 mg | ORAL_TABLET | Freq: Three times a day (TID) | ORAL | 0 refills | Status: DC

## 2020-12-07 MED ORDER — OXYCODONE HCL 5 MG PO TABS: 5.0000 mg | ORAL_TABLET | Freq: Every day | ORAL | 0 refills | Status: DC

## 2020-12-07 MED ORDER — OXYCODONE HCL 5 MG PO TABS: 5.0000 mg | ORAL_TABLET | Freq: Two times a day (BID) | ORAL | 0 refills | Status: DC

## 2020-12-07 NOTE — Telephone Encounter (Signed)
Returned the phone call to Rosanne Ashing at [facility] for clarification of taper.  Dr [facility] is reviewing and I will let him know when we have the correct taper in place.

## 2020-12-07 NOTE — Progress Notes (Signed)
Pharmacy called to point out a miscalculation on the fill dates for the oxycodone IR 5 mg prescriptions written for January.  I went through all the calculations again and the pharmacist was correct.  I have canceled the prescriptions with the wrong dates and I have sent new ones with the correct filled dates.

## 2020-12-07 NOTE — Telephone Encounter (Signed)
The pharmacy needs a call back to discuss medications Phone number is [phone]

## 2020-12-07 NOTE — Telephone Encounter (Signed)
Called Jim[facility] with clarification on taper.  He understands and we have cancelled appropriate Rx's per FN and new Rx's were sent to satisfy taper.

## 2021-01-19 ENCOUNTER — Other Ambulatory Visit: Payer: Self-pay | Admitting: Pain Medicine

## 2021-01-19 ENCOUNTER — Telehealth: Payer: Self-pay | Admitting: Pain Medicine

## 2021-01-19 NOTE — Telephone Encounter (Signed)
Spoke with patient and she states she is confused and she thinks she has 14 extra Morphine tablets and she does not know what she should do next.  Spoke with Dr Laban Emperor and he explained the taper.  Called the patient back and she states that she was confused and that she was looking at the wrong bottle and she does not have any extra Morphine left.  States she only has the oxycodone 5 mg and she is to take it tid.  I reinforced to her that starting tomorrow she should only be taking the oxycodone 5 mg tid and  no Morphine.  Patient states she does not have any extra Morphine to take.  States that's he understands that she whould only be taking the oxycodone 5 mg tid from now until the next prescription which is oxycodone 5 mg bid.  Instructed patient to call us for any further questions or concerns.

## 2021-03-01 NOTE — Progress Notes (Signed)
PROVIDER NOTE: Information contained herein reflects review and annotations entered in association with encounter. Interpretation of such information and data should be left to medically-trained personnel. Information provided to patient can be located elsewhere in the medical record under "Patient Instructions". Document created using STT-dictation technology, any transcriptional errors that may result from process are unintentional.    Patient: Tiffany Herring  Service Category: E/M  Provider: Gaspar Cola, MD  DOB: [birth year]  DOS: 03/02/2021  Specialty: Interventional Pain Management  MRN: [id number]  Setting: Ambulatory outpatient  PCP: Hortencia Pilar, MD  Type: Established Patient    Referring Provider: Hortencia Pilar, MD  Location: Office  Delivery: Face-to-face     HPI  Ms. Gwenette Wellons, a 61 y.o. year old female, is here today because of her Chronic pain syndrome [G89.4]. Ms. Sethi primary complain today is Other (Multiple sclerosis pain and arthritic pain ), Back Pain (Entire back ), Hip Pain (Bilateral ), Knee Pain (Bilateral ), Ankle Pain (Bilateral ), and Eye Pain (Neuropathy left eye ) Last encounter: My last encounter with her was on 01/19/2021. Pertinent problems: Ms. Braun has Failed back surgical syndrome; Epidural fibrosis; Chronic low back pain (Bilateral) w/ sciatica (Right); Chronic lumbar radicular pain (Right); Generalized OA; Multiple sclerosis ([place]); Degenerative arthritis of hip; Fibromyalgia; Lumbar spondylosis; Grade 1 Anterolisthesis of L4/L5; Chronic sacroiliac joint pain (bilateral); Chronic hip pain (Left); Arthropathy of left hip (Severe chronic left hip DJD); Osteoarthritis of hip (Left); Lumbar facet arthropathy ([place]); Lumbar facet hypertrophy; Lumbar facet syndrome (Bilateral); Chronic flank pain (Right); Chronic lower extremity pain (Right); Chronic pain syndrome; Venous stasis ulcer ([place]); OA (osteoarthritis); Osteoarthrosis, hip; Generalized  osteoarthritis of multiple sites; and DDD (degenerative disc disease), lumbosacral on their pertinent problem list. Pain Assessment: Severity of Chronic pain is reported as a 8 /10. Location: Back (see visit info for all pain sites) Mid, Lower, Left, Right/left hip radiates down to the knee. Onset: More than a month ago. Quality: Discomfort, Constant. Timing: Constant. Modifying factor(s): patient is on drug holiday or lessened dose.  is having increased pain. Vitals:  height is 5' 2" (1.575 m) and weight is 300 lb (136.1 kg). Her temporal temperature is 98.1 F (36.7 C). Her blood pressure is 167/95 (abnormal) and her pulse is 96. Her respiration is 16 and oxygen saturation is 97%.   Reason for encounter: medication management.  The patient returns today after having completed her drug holiday.  She is completely off of the narcotics but she refers that she has completely lost her ability to function.  At this point she refers that the only thing that she can do is transfer from her wheelchair to the commode and back.  At this point, we will go ahead and restart her on morphine ER 30 mg 1 tablet p.o. twice daily for a total MME of 60 mg/day.  Hopefully with the drug holiday completed, we will be able to bring down her opioid use to a safer level.  Today we will also provide her with a prescription for Narcan since she should have lost her tolerance to the opioids and therefore I do expect that it should hit her harder.  Her son was present and they were instructed to be in the look out for the effects of the medicine when she restarts it.  The patient returns today after having completed a drug holiday.  RTCB: 04/01/2021  Pharmacotherapy Assessment  Analgesic:   "Drug Holiday" completed on 02/23/2021.  Patient restarted on morphine ER  30 mg p.o. twice daily (60 MME). MME/day: 60 mg/day from an initial 110 mg/day.   Monitoring: [place] PMP: PDMP reviewed during this encounter.       Pharmacotherapy: No  side-effects or adverse reactions reported. Compliance: No problems identified. Effectiveness: Clinically acceptable.  Janett Billow, RN  03/02/2021 11:11 AM  Sign when Signing Visit Nursing Pain Medication Assessment:  Safety precautions to be maintained throughout the outpatient stay will include: orient to surroundings, keep bed in low position, maintain call bell within reach at all times, provide assistance with transfer out of bed and ambulation.  Medication Inspection Compliance:  empty pill bottles   Medication #1: Morphine ER (MSContin) Pill/Patch Count:  0 of 42 pills remain Pill/Patch Appearance:  no pills  Bottle Appearance: Standard pharmacy container. Clearly labeled. Filled Date: 13 / 21 / 2022 Last Medication intake:   2 weeks ago   Medication #2: Oxycodone IR Pill/Patch Count:  0 of 7 pills remain Pill/Patch Appearance:  no pills  Bottle Appearance: Standard pharmacy container. Clearly labeled. Filled Date: 02 / 01 / 2023 Last Medication intake:   2 weeks ago     UDS:  Summary  Date Value Ref Range Status  05/27/2020 Note  Final    Comment:    ==================================================================== [employer] [facility] 13 (MW) ==================================================================== Test                             Result       Flag       Units  Drug Present and Declared for Prescription Verification   Morphine                       >[id number]        EXPECTED   ng/mg creat   Normorphine                    263          EXPECTED   ng/mg creat    Potential sources of large amounts of morphine in the absence of    codeine include administration of morphine or use of heroin.     Normorphine is an expected metabolite of morphine.    Hydrocodone                    778          EXPECTED   ng/mg creat   Hydromorphone                  155          EXPECTED   ng/mg creat   Dihydrocodeine                 55           EXPECTED   ng/mg creat    Norhydrocodone                 952          EXPECTED   ng/mg creat    Sources of hydrocodone include scheduled prescription medications.    Hydromorphone, dihydrocodeine and norhydrocodone are expected    metabolites of hydrocodone. Hydromorphone and dihydrocodeine are    also available as scheduled prescription medications.  ==================================================================== Test                      Result    Flag  Units      Ref Range   Creatinine              130              mg/dL      >=20 ==================================================================== Declared Medications:  The flagging and interpretation on this report are based on the  following declared medications.  Unexpected results may arise from  inaccuracies in the declared medications.   **Note: The testing scope of this panel includes these medications:   Hydrocodone (Norco)  Morphine (MS Contin)   **Note: The testing scope of this panel does not include the  following reported medications:   Acetaminophen (Norco)  Insulin  Mirtazapine (Remeron)  Montelukast (Singulair)  Naloxone (Narcan)  Oxybutynin (Ditropan)  Tizanidine (Zanaflex) ==================================================================== For clinical consultation, please call [phone]. ====================================================================      ROS  Constitutional: Denies any fever or chills Gastrointestinal: No reported hemesis, hematochezia, vomiting, or acute GI distress Musculoskeletal: Denies any acute onset joint swelling, redness, loss of ROM, or weakness Neurological: No reported episodes of acute onset apraxia, aphasia, dysarthria, agnosia, amnesia, paralysis, loss of coordination, or loss of consciousness  Medication Review  BLOOD GLUCOSE TEST STRIPS, Fifty50 Glucose Meter 2.0, Insulin Syringe-Needle U-100, accu-chek multiclix, albuterol, aspirin, buPROPion, clotrimazole, enalapril,  furosemide, gabapentin, glucose blood, insulin regular, mirtazapine, montelukast, morphine, naloxone, oxyCODONE, oxybutynin, tiZANidine, timolol, and vitamin B-12  History Review  Allergy: Ms. Givhan is allergic to amoxicillin-pot clavulanate, copaxone  [glatiramer acetate], pseudoephedrine, rofecoxib, sulfa antibiotics, sulfamethoxazole-trimethoprim, and meloxicam. Drug: Ms. Bradburn  reports no history of drug use. Alcohol:  reports no history of alcohol use. Tobacco:  reports that she has quit smoking. She has never used smokeless tobacco. Social: Ms. Vary  reports that she has quit smoking. She has never used smokeless tobacco. She reports that she does not drink alcohol and does not use drugs. Medical:  has a past medical history of Allergy, Arthritis, Asthma, Chronic kidney disease, Depression, Diabetes mellitus without complication ([place]), Hypertension, Multiple sclerosis ([place]), Neuromuscular disorder ([place]), and Sleep apnea. Surgical: Ms. Kahler  has a past surgical history that includes Cesarean section (1991 and 1996). Family: family history includes Dementia in her mother; Depression in her mother; Diabetes in her mother; Hyperlipidemia in her mother; Stroke in her father.  Laboratory Chemistry Profile   Renal Lab Results  Component Value Date   BUN 18 02/06/2018   CREATININE 1.22 (H) 02/06/2018   BCR 15 02/06/2018   GFRAA 56 (L) 02/06/2018   GFRNONAA 49 (L) 02/06/2018    Hepatic Lab Results  Component Value Date   AST 25 02/06/2018   ALT 19 02/15/2015   ALBUMIN 4.6 02/06/2018   ALKPHOS 97 02/06/2018    Electrolytes Lab Results  Component Value Date   NA 136 02/06/2018   K 4.9 02/06/2018   CL 95 (L) 02/06/2018   CALCIUM 9.8 02/06/2018   MG 1.8 02/06/2018    Bone Lab Results  Component Value Date   25OHVITD1 55 02/06/2018   25OHVITD2 2.9 02/06/2018   25OHVITD3 52 02/06/2018    Inflammation (CRP: Acute Phase) (ESR: Chronic Phase) Lab Results  Component  Value Date   CRP 17 (H) 02/06/2018   ESRSEDRATE 28 02/06/2018         Note: Above Lab results reviewed.  Recent Imaging Review  US Abdomen Complete * PRIOR REPORT IMPORTED FROM AN EXTERNAL SYSTEM *   PRIOR REPORT IMPORTED FROM THE SYNGO WORKFLOW SYSTEM   REASON FOR EXAM:  Enlarged Liver Spleen  COMMENTS:   PROCEDURE:     [place]  - US ABDOMEN GENERAL SURVEY  - Jul 29 2012 10:20AM   RESULT:     The liver is enlarged. It demonstrates increased echo texture  consistent with fatty infiltration. Portal venous flow is normal in  direction toward the liver. There is splenomegaly with maximal dimension  of  the spleen of 16.2 cm.   The gallbladder is adequately distended with no evidence of stones, wall  thickening, or pericholecystic fluid. There is no positive sonographic  Murphy's sign. The common bile duct measures 5.5 mm in diameter.   Evaluation the pancreas is limited by bowel gas. Similarly the abdominal  aorta is only partially visualized. The inferior vena cava and kidneys are  normal in appearance.   IMPRESSION:  1. There is hepatosplenomegaly.  2. The gallbladder and common bile duct and observed portions of the  pancreas appear normal.  3. The kidneys exhibit no evidence of obstruction nor other acute  abnormality.   Dictation Site: 2    Note: Reviewed        Physical Exam  General appearance: Well nourished, well developed, and well hydrated. In no apparent acute distress Mental status: Alert, oriented x 3 (person, place, & time)       Respiratory: No evidence of acute respiratory distress Eyes: PERLA Vitals: BP (!) 167/95 (BP Location: Left Arm, Patient Position: Sitting, Cuff Size: Normal) Comment (BP Location): forearm   Pulse 96    Temp 98.1 F (36.7 C) (Temporal)    Resp 16    Ht 5' 2" (1.575 m)    Wt 300 lb (136.1 kg)    SpO2 97%    BMI 54.87 kg/m  BMI: Estimated body mass index is 54.87 kg/m as calculated from the following:   Height as of this  encounter: 5' 2" (1.575 m).   Weight as of this encounter: 300 lb (136.1 kg). Ideal: Ideal body weight: 50.1 kg (110 lb 7.2 oz) Adjusted ideal body weight: 84.5 kg (186 lb 4.3 oz)  Assessment   Status Diagnosis  Controlled Controlled Controlled 1. Chronic pain syndrome   2. Multiple sclerosis ([place])   3. Failed back surgical syndrome   4. Chronic low back pain (Bilateral) w/ sciatica (Right)   5. Chronic lower extremity pain (Right)   6. Chronic hip pain (Left)   7. Chronic sacroiliac joint pain (bilateral)   8. Fibromyalgia   9. Lumbar facet syndrome (Bilateral)   10. Generalized osteoarthritis of multiple sites   11. DDD (degenerative disc disease), lumbosacral   12. Pharmacologic therapy   13. Chronic use of opiate for therapeutic purpose   14. Encounter for medication management      Updated Problems: No problems updated.  Plan of Care  Problem-specific:  No problem-specific Assessment & Plan notes found for this encounter.  Ms. Mekaela Azizi has a current medication list which includes the following long-term medication(s): albuterol, bupropion, furosemide, furosemide, gabapentin, insulin regular, mirtazapine, montelukast, morphine, and [DISCONTINUED] oxycodone.  Pharmacotherapy (Medications Ordered): Meds ordered this encounter  Medications   naloxone (NARCAN) nasal spray 4 mg/0.1 mL    Sig: Place 1 spray into the nose as needed for up to 365 doses (for opioid-induced respiratory depresssion). In case of emergency (overdose), spray once into each nostril. If no response within 3 minutes, repeat application and call [phone].    Dispense:  1 each    Refill:  0    Instruct patient in  proper use of device.   morphine (MS CONTIN) 30 MG 12 hr tablet    Sig: Take 1 tablet (30 mg total) by mouth every 12 (twelve) hours. Must last 30 days. Do not break tablet    Dispense:  60 tablet    Refill:  0    Chronic Pain: STOP Act (Not applicable) Fill 1 day early if closed on refill  date. Avoid benzodiazepines within 8 hours of opioids   Orders:  No orders of the defined types were placed in this encounter.  Follow-up plan:   Return in about 30 days (around 04/01/2021) for Eval-day (M,W), (F2F), (MM).     Interventional Therapies  Risk   Complexity Considerations:   Estimated body mass index is 54.87 kg/m as calculated from the following:   Height as of this encounter: 5' 2" (1.575 m).   Weight as of this encounter: 300 lb (136.1 kg). WNL   Planned   Pending:   (03/02/2021) today we have restarted the patient on the opioid analgesics.  We will be starting her with morphine ER 30 mg, 1 tab p.o. twice daily (60 MME).  We will reevaluate her in 1 month and determine if she is doing well with this new dose after the "Drug Holiday".  Should she feel that this is not enough, we will need to order genetic testing to see if the issue is that she is an ultra-rapid metabolizer.  She has indicated that the oxycodone does not help her pain in any way.   Under consideration:    "Drug Holiday" completed on 02/23/2021.  Total daily dose brought down from 110 MME/day to 60.   Completed:   None   Therapeutic   Palliative (PRN) options:   None    Recent Visits No visits were found meeting these conditions. Showing recent visits within past 90 days and meeting all other requirements Today's Visits Date Type Provider Dept  03/02/21 Office Visit Milinda Pointer, MD [facility]  Showing today's visits and meeting all other requirements Future Appointments Date Type Provider Dept  03/17/21 Appointment Milinda Pointer, MD [facility]  Showing future appointments within next 90 days and meeting all other requirements  I discussed the assessment and treatment plan with the patient. The patient was provided an opportunity to ask questions and all were answered. The patient agreed with the plan and demonstrated an understanding of the  instructions.  Patient advised to call back or seek an in-person evaluation if the symptoms or condition worsens.  Duration of encounter: 30 minutes.  Note by: Gaspar Cola, MD Date: 03/02/2021; Time: 11:44 AM

## 2021-03-02 ENCOUNTER — Encounter: Payer: Self-pay | Admitting: Pain Medicine

## 2021-03-02 ENCOUNTER — Ambulatory Visit: Payer: Medicare HMO | Attending: Pain Medicine | Admitting: Pain Medicine

## 2021-03-02 ENCOUNTER — Other Ambulatory Visit: Payer: Self-pay

## 2021-03-02 VITALS — BP 167/95 | HR 96 | Temp 98.1°F | Resp 16 | Ht 62.0 in | Wt 300.0 lb

## 2021-03-02 DIAGNOSIS — M5441 Lumbago with sciatica, right side: Secondary | ICD-10-CM | POA: Insufficient documentation

## 2021-03-02 DIAGNOSIS — M533 Sacrococcygeal disorders, not elsewhere classified: Secondary | ICD-10-CM | POA: Insufficient documentation

## 2021-03-02 DIAGNOSIS — M25552 Pain in left hip: Secondary | ICD-10-CM | POA: Insufficient documentation

## 2021-03-02 DIAGNOSIS — G35 Multiple sclerosis: Secondary | ICD-10-CM | POA: Insufficient documentation

## 2021-03-02 DIAGNOSIS — M5137 Other intervertebral disc degeneration, lumbosacral region: Secondary | ICD-10-CM | POA: Insufficient documentation

## 2021-03-02 DIAGNOSIS — Z79891 Long term (current) use of opiate analgesic: Secondary | ICD-10-CM | POA: Diagnosis present

## 2021-03-02 DIAGNOSIS — Z79899 Other long term (current) drug therapy: Secondary | ICD-10-CM | POA: Diagnosis present

## 2021-03-02 DIAGNOSIS — M79604 Pain in right leg: Secondary | ICD-10-CM | POA: Insufficient documentation

## 2021-03-02 DIAGNOSIS — G8929 Other chronic pain: Secondary | ICD-10-CM | POA: Insufficient documentation

## 2021-03-02 DIAGNOSIS — G894 Chronic pain syndrome: Secondary | ICD-10-CM | POA: Diagnosis present

## 2021-03-02 DIAGNOSIS — M159 Polyosteoarthritis, unspecified: Secondary | ICD-10-CM | POA: Insufficient documentation

## 2021-03-02 DIAGNOSIS — M47816 Spondylosis without myelopathy or radiculopathy, lumbar region: Secondary | ICD-10-CM | POA: Insufficient documentation

## 2021-03-02 DIAGNOSIS — M797 Fibromyalgia: Secondary | ICD-10-CM | POA: Insufficient documentation

## 2021-03-02 DIAGNOSIS — M961 Postlaminectomy syndrome, not elsewhere classified: Secondary | ICD-10-CM | POA: Diagnosis present

## 2021-03-02 MED ORDER — NALOXONE HCL 4 MG/0.1ML NA LIQD: 1.0000 | NASAL | 0 refills | Status: AC | PRN

## 2021-03-02 MED ORDER — MORPHINE SULFATE ER 30 MG PO TBCR: 30.0000 mg | EXTENDED_RELEASE_TABLET | Freq: Two times a day (BID) | ORAL | 0 refills | Status: DC

## 2021-03-02 NOTE — Progress Notes (Signed)
Nursing Pain Medication Assessment:  Safety precautions to be maintained throughout the outpatient stay will include: orient to surroundings, keep bed in low position, maintain call bell within reach at all times, provide assistance with transfer out of bed and ambulation.  Medication Inspection Compliance:  empty pill bottles   Medication #1: Morphine ER (MSContin) Pill/Patch Count:  0 of 42 pills remain Pill/Patch Appearance:  no pills  Bottle Appearance: Standard pharmacy container. Clearly labeled. Filled Date: 26 / 21 / 2022 Last Medication intake:   2 weeks ago   Medication #2: Oxycodone IR Pill/Patch Count:  0 of 7 pills remain Pill/Patch Appearance:  no pills  Bottle Appearance: Standard pharmacy container. Clearly labeled. Filled Date: 02 / 01 / 2023 Last Medication intake:   2 weeks ago

## 2021-03-17 ENCOUNTER — Ambulatory Visit: Payer: 59 | Attending: Pain Medicine | Admitting: Pain Medicine

## 2021-03-17 ENCOUNTER — Encounter: Payer: Self-pay | Admitting: Pain Medicine

## 2021-03-17 ENCOUNTER — Other Ambulatory Visit: Payer: Self-pay

## 2021-03-17 VITALS — BP 130/79 | HR 87 | Temp 98.4°F | Resp 16 | Ht 62.0 in | Wt 300.0 lb

## 2021-03-17 DIAGNOSIS — M961 Postlaminectomy syndrome, not elsewhere classified: Secondary | ICD-10-CM

## 2021-03-17 DIAGNOSIS — Z79899 Other long term (current) drug therapy: Secondary | ICD-10-CM | POA: Diagnosis present

## 2021-03-17 DIAGNOSIS — M5137 Other intervertebral disc degeneration, lumbosacral region: Secondary | ICD-10-CM | POA: Diagnosis present

## 2021-03-17 DIAGNOSIS — M5441 Lumbago with sciatica, right side: Secondary | ICD-10-CM | POA: Insufficient documentation

## 2021-03-17 DIAGNOSIS — Z79891 Long term (current) use of opiate analgesic: Secondary | ICD-10-CM

## 2021-03-17 DIAGNOSIS — M533 Sacrococcygeal disorders, not elsewhere classified: Secondary | ICD-10-CM | POA: Diagnosis present

## 2021-03-17 DIAGNOSIS — G894 Chronic pain syndrome: Secondary | ICD-10-CM

## 2021-03-17 DIAGNOSIS — G8929 Other chronic pain: Secondary | ICD-10-CM

## 2021-03-17 DIAGNOSIS — M79604 Pain in right leg: Secondary | ICD-10-CM | POA: Insufficient documentation

## 2021-03-17 DIAGNOSIS — M159 Polyosteoarthritis, unspecified: Secondary | ICD-10-CM | POA: Diagnosis present

## 2021-03-17 DIAGNOSIS — G35 Multiple sclerosis: Secondary | ICD-10-CM

## 2021-03-17 DIAGNOSIS — M797 Fibromyalgia: Secondary | ICD-10-CM | POA: Diagnosis present

## 2021-03-17 DIAGNOSIS — M47816 Spondylosis without myelopathy or radiculopathy, lumbar region: Secondary | ICD-10-CM

## 2021-03-17 DIAGNOSIS — M51379 Other intervertebral disc degeneration, lumbosacral region without mention of lumbar back pain or lower extremity pain: Secondary | ICD-10-CM

## 2021-03-17 DIAGNOSIS — M25552 Pain in left hip: Secondary | ICD-10-CM | POA: Diagnosis present

## 2021-03-17 MED ORDER — MORPHINE SULFATE 15 MG PO TABS: 15.0000 mg | ORAL_TABLET | Freq: Three times a day (TID) | ORAL | 0 refills | Status: DC

## 2021-03-17 NOTE — Progress Notes (Signed)
PROVIDER NOTE: Information contained herein reflects review and annotations entered in association with encounter. Interpretation of such information and data should be left to medically-trained personnel. Information provided to patient can be located elsewhere in the medical record under "Patient Instructions". Document created using STT-dictation technology, any transcriptional errors that may result from process are unintentional.    Patient: Tiffany Herring  Service Category: E/M  Provider: Gaspar Cola, MD  DOB: Nov 15, 1960  DOS: 03/17/2021  Specialty: Interventional Pain Management  MRN: [id number]  Setting: Ambulatory outpatient  PCP: Tiffany Pilar, MD  Type: Established Patient    Referring Provider: Hortencia Pilar, MD  Location: Office  Delivery: Face-to-face     HPI  [name], a 61 y.o. year old female, is here today because of her Chronic pain syndrome [G89.4]. [name] primary complain today is Multiple Sclerosis and Hip Pain (Left to knee) Last encounter: My last encounter with her was on 03/02/2021. Pertinent problems: [name] has Failed back surgical syndrome; Epidural fibrosis; Chronic low back pain (Bilateral) w/ sciatica (Right); Chronic lumbar radicular pain (Right); Generalized OA; Multiple sclerosis ([place]); Degenerative arthritis of hip; Fibromyalgia; Lumbar spondylosis; Grade 1 Anterolisthesis of L4/L5; Chronic sacroiliac joint pain (bilateral); Chronic hip pain (Left); Arthropathy of left hip (Severe chronic left hip DJD); Osteoarthritis of hip (Left); Lumbar facet arthropathy ([place]); Lumbar facet hypertrophy; Lumbar facet syndrome (Bilateral); Chronic flank pain (Right); Chronic lower extremity pain (Right); Chronic pain syndrome; Venous stasis ulcer ([place]); OA (osteoarthritis); Osteoarthrosis, hip; Generalized osteoarthritis of multiple sites; and DDD (degenerative disc disease), lumbosacral on their pertinent problem list. Pain Assessment: Severity of  Chronic pain is reported as a 5 /10. Location:  (MS pain and left hip)  / . Onset: More than a month ago. Quality: Sharp, Stabbing, Constant. Timing: Constant. Modifying factor(s): medications, rest. Vitals:  height is 5' 2"  (1.575 m) and weight is 300 lb (136.1 kg). Her temporal temperature is 98.4 F (36.9 C). Her blood pressure is 130/79 and her pulse is 87. Her respiration is 16 and oxygen saturation is 99%.   Reason for encounter: medication management.  The patient indicates that having completed the "Drug Holiday" completely eliminated her tolerance to the opioids and so when she went back to the extended release morphine 30 mg twice a day, she has noticed that she spends a lot of time sleeping and falls asleep while sitting down.  For this reason, she has requested that we go down on her dose.  She has also noticed that the MS Contin does not really last 12 hours as it claims that it does.  For this reason, we will go ahead and discontinue the MS Contin 30 mg twice daily and we will go to the morphine IR 15 mg 3 times a day for a total of 45 mg/day.  Because the patient wanted to switch to be done as soon as possible, she has returned all of her MS Contin pills which have been destroyed in front of witnesses.  RTCB: 06/15/2021  Pharmacotherapy Assessment  Analgesic: MSIR 15 mg, 1 tab PO TID. "Drug Holiday" completed on 02/23/2021.  Martin Majestic from MME of 110 mg/day to 45 mg/day). MME/day: 45 mg/day   Monitoring: [place] PMP: PDMP reviewed during this encounter.       Pharmacotherapy: No side-effects or adverse reactions reported. Compliance: No problems identified. Effectiveness: Clinically acceptable.  Hart Rochester, RN  03/17/2021  1:42 PM  Sign when Signing Visit Nursing Pain Medication Assessment:  Safety precautions  to be maintained throughout the outpatient stay will include: orient to surroundings, keep bed in low position, maintain call bell within reach at all times, provide assistance  with transfer out of bed and ambulation.  Medication Inspection Compliance: Pill count conducted under aseptic conditions, in front of the patient. Neither the pills nor the bottle was removed from the patient's sight at any time. Once count was completed pills were immediately returned to the patient in their original bottle.  Medication: Morphine ER (MSContin) Pill/Patch Count:  42 of 60 pills remain Pill/Patch Appearance: Markings consistent with prescribed medication Bottle Appearance: Standard pharmacy container. Clearly labeled. Filled Date: 02 / 22 / 2023 Last Medication intake:  Today    UDS:  Summary  Date Value Ref Range Status  05/27/2020 Note  Final    Comment:    ==================================================================== [employer] (MW) ==================================================================== Test                             Result       Flag       Units  Drug Present and Declared for Prescription Verification   Morphine                       >[id number]        EXPECTED   ng/mg creat   Normorphine                    263          EXPECTED   ng/mg creat    Potential sources of large amounts of morphine in the absence of    codeine include administration of morphine or use of heroin.     Normorphine is an expected metabolite of morphine.    Hydrocodone                    778          EXPECTED   ng/mg creat   Hydromorphone                  155          EXPECTED   ng/mg creat   Dihydrocodeine                 55           EXPECTED   ng/mg creat   Norhydrocodone                 952          EXPECTED   ng/mg creat    Sources of hydrocodone include scheduled prescription medications.    Hydromorphone, dihydrocodeine and norhydrocodone are expected    metabolites of hydrocodone. Hydromorphone and dihydrocodeine are    also available as scheduled prescription medications.  ==================================================================== Test                       Result    Flag   Units      Ref Range   Creatinine              130              mg/dL      >=20 ==================================================================== Declared Medications:  The flagging and interpretation on this report are based on the  following declared medications.  Unexpected results may arise from  inaccuracies in the declared  medications.   **Note: The testing scope of this panel includes these medications:   Hydrocodone (Norco)  Morphine (MS Contin)   **Note: The testing scope of this panel does not include the  following reported medications:   Acetaminophen (Norco)  Insulin  Mirtazapine (Remeron)  Montelukast (Singulair)  Naloxone (Narcan)  Oxybutynin (Ditropan)  Tizanidine (Zanaflex) ==================================================================== For clinical consultation, please call [phone]. ====================================================================      ROS  Constitutional: Denies any fever or chills Gastrointestinal: No reported hemesis, hematochezia, vomiting, or acute GI distress Musculoskeletal: Denies any acute onset joint swelling, redness, loss of ROM, or weakness Neurological: No reported episodes of acute onset apraxia, aphasia, dysarthria, agnosia, amnesia, paralysis, loss of coordination, or loss of consciousness  Medication Review  BLOOD GLUCOSE TEST STRIPS, Fifty50 Glucose Meter 2.0, Insulin Syringe-Needle U-100, accu-chek multiclix, albuterol, aspirin, buPROPion, clotrimazole, enalapril, furosemide, gabapentin, glucose blood, insulin regular, mirtazapine, montelukast, morphine, naloxone, oxyCODONE, oxybutynin, tiZANidine, timolol, and vitamin B-12  History Review  Allergy: [name] is allergic to amoxicillin-pot clavulanate, copaxone  [glatiramer acetate], pseudoephedrine, rofecoxib, sulfa antibiotics, sulfamethoxazole-trimethoprim, and meloxicam. Drug: [name]  reports no history of drug  use. Alcohol:  reports no history of alcohol use. Tobacco:  reports that she has quit smoking. She has never used smokeless tobacco. Social: [name]  reports that she has quit smoking. She has never used smokeless tobacco. She reports that she does not drink alcohol and does not use drugs. Medical:  has a past medical history of Allergy, Arthritis, Asthma, Chronic kidney disease, Depression, Diabetes mellitus without complication ([place]), Hypertension, Multiple sclerosis ([place]), Neuromuscular disorder ([place]), and Sleep apnea. Surgical: [name]  has a past surgical history that includes Cesarean section (1991 and 1996). Family: family history includes Dementia in her mother; Depression in her mother; Diabetes in her mother; Hyperlipidemia in her mother; Stroke in her father.  Laboratory Chemistry Profile   Renal Lab Results  Component Value Date   BUN 18 02/06/2018   CREATININE 1.22 (H) 02/06/2018   BCR 15 02/06/2018   GFRAA 56 (L) 02/06/2018   GFRNONAA 49 (L) 02/06/2018    Hepatic Lab Results  Component Value Date   AST 25 02/06/2018   ALT 19 02/15/2015   ALBUMIN 4.6 02/06/2018   ALKPHOS 97 02/06/2018    Electrolytes Lab Results  Component Value Date   NA 136 02/06/2018   K 4.9 02/06/2018   CL 95 (L) 02/06/2018   CALCIUM 9.8 02/06/2018   MG 1.8 02/06/2018    Bone Lab Results  Component Value Date   25OHVITD1 55 02/06/2018   25OHVITD2 2.9 02/06/2018   25OHVITD3 52 02/06/2018    Inflammation (CRP: Acute Phase) (ESR: Chronic Phase) Lab Results  Component Value Date   CRP 17 (H) 02/06/2018   ESRSEDRATE 28 02/06/2018         Note: Above Lab results reviewed.  Recent Imaging Review  US Abdomen Complete * PRIOR REPORT IMPORTED FROM AN EXTERNAL SYSTEM *   PRIOR REPORT IMPORTED FROM THE [facility] WORKFLOW SYSTEM   REASON FOR EXAM:    Enlarged Liver Spleen  COMMENTS:   PROCEDURE:     [place]  - US ABDOMEN GENERAL SURVEY  - Jul 29 2012 10:20AM   RESULT:     The liver  is enlarged. It demonstrates increased echo texture  consistent with fatty infiltration. Portal venous flow is normal in  direction toward the liver. There is splenomegaly with maximal dimension  of  the spleen of 16.2 cm.   The  gallbladder is adequately distended with no evidence of stones, wall  thickening, or pericholecystic fluid. There is no positive sonographic  Murphy's sign. The common bile duct measures 5.5 mm in diameter.   Evaluation the pancreas is limited by bowel gas. Similarly the abdominal  aorta is only partially visualized. The inferior vena cava and kidneys are  normal in appearance.   IMPRESSION:  1. There is hepatosplenomegaly.  2. The gallbladder and common bile duct and observed portions of the  pancreas appear normal.  3. The kidneys exhibit no evidence of obstruction nor other acute  abnormality.   Dictation Site: 2    Note: Reviewed        Physical Exam  General appearance: Well nourished, well developed, and well hydrated. In no apparent acute distress Mental status: Alert, oriented x 3 (person, place, & time)       Respiratory: No evidence of acute respiratory distress Eyes: PERLA Vitals: BP 130/79    Pulse 87    Temp 98.4 F (36.9 C) (Temporal)    Resp 16    Ht 5' 2"  (1.575 m)    Wt 300 lb (136.1 kg)    SpO2 99%    BMI 54.87 kg/m  BMI: Estimated body mass index is 54.87 kg/m as calculated from the following:   Height as of this encounter: 5' 2"  (1.575 m).   Weight as of this encounter: 300 lb (136.1 kg). Ideal: Ideal body weight: 50.1 kg (110 lb 7.2 oz) Adjusted ideal body weight: 84.5 kg (186 lb 4.3 oz)  Assessment   Status Diagnosis  Controlled Controlled Controlled 1. Chronic pain syndrome   2. Chronic low back pain (Bilateral) w/ sciatica (Right)   3. Chronic lower extremity pain (Right)   4. Failed back surgical syndrome   5. DDD (degenerative disc disease), lumbosacral   6. Lumbar facet syndrome (Bilateral)   7. Chronic sacroiliac  joint pain (bilateral)   8. Chronic hip pain (Left)   9. Generalized osteoarthritis of multiple sites   10. Multiple sclerosis ([place])   11. Fibromyalgia   12. Pharmacologic therapy   13. Chronic use of opiate for therapeutic purpose   14. Encounter for medication management   15. Encounter for chronic pain management      Updated Problems: No problems updated.  Plan of Care  Problem-specific:  No problem-specific Assessment & Plan notes found for this encounter.  [name] has a current medication list which includes the following long-term medication(s): albuterol, bupropion, furosemide, gabapentin, insulin regular, mirtazapine, montelukast, morphine, [START ON 04/16/2021] morphine, [START ON 05/16/2021] morphine, and [DISCONTINUED] oxycodone.  Pharmacotherapy (Medications Ordered): Meds ordered this encounter  Medications   morphine (MSIR) 15 MG tablet    Sig: Take 1 tablet (15 mg total) by mouth every 8 (eight) hours. Must last 30 days.    Dispense:  90 tablet    Refill:  0    DO NOT: delete (not duplicate); no partial-fill (will deny script to complete), no refill request (F/U required). DISPENSE: 1 day early if closed on fill date. WARN: No CNS-depressants within 8 hrs of med.   morphine (MSIR) 15 MG tablet    Sig: Take 1 tablet (15 mg total) by mouth every 8 (eight) hours. Must last 30 days.    Dispense:  90 tablet    Refill:  0    DO NOT: delete (not duplicate); no partial-fill (will deny script to complete), no refill request (F/U required). DISPENSE: 1 day early if closed  on fill date. WARN: No CNS-depressants within 8 hrs of med.   morphine (MSIR) 15 MG tablet    Sig: Take 1 tablet (15 mg total) by mouth every 8 (eight) hours. Must last 30 days.    Dispense:  90 tablet    Refill:  0    DO NOT: delete (not duplicate); no partial-fill (will deny script to complete), no refill request (F/U required). DISPENSE: 1 day early if closed on fill date. WARN: No  CNS-depressants within 8 hrs of med.   Orders:  No orders of the defined types were placed in this encounter.  Follow-up plan:   Return in about 3 months (around 06/15/2021) for Eval-day (M,W), (F2F), (MM).     Interventional Therapies  Risk   Complexity Considerations:   Estimated body mass index is 54.87 kg/m as calculated from the following:   Height as of this encounter: 5' 2"  (1.575 m).   Weight as of this encounter: 300 lb (136.1 kg). WNL   Planned   Pending:   (03/02/2021) today we have restarted the patient on the opioid analgesics.  We will be starting her with morphine ER 30 mg, 1 tab p.o. twice daily (60 MME).  We will reevaluate her in 1 month and determine if she is doing well with this new dose after the "Drug Holiday".  Should she feel that this is not enough, we will need to order genetic testing to see if the issue is that she is an ultra-rapid metabolizer.  She has indicated that the oxycodone does not help her pain in any way.   Under consideration:    "Drug Holiday" completed on 02/23/2021.  Total daily dose brought down from 110 MME/day to 60.   Completed:   None   Therapeutic   Palliative (PRN) options:   None    Recent Visits Date Type Provider Dept  03/02/21 Office Visit Milinda Pointer, MD [facility]  Showing recent visits within past 90 days and meeting all other requirements Today's Visits Date Type Provider Dept  03/17/21 Office Visit Milinda Pointer, MD [facility]  Showing today's visits and meeting all other requirements Future Appointments Date Type Provider Dept  06/15/21 Appointment Milinda Pointer, MD [facility]  Showing future appointments within next 90 days and meeting all other requirements  I discussed the assessment and treatment plan with the patient. The patient was provided an opportunity to ask questions and all were answered. The patient agreed with the plan and demonstrated an understanding  of the instructions.  Patient advised to call back or seek an in-person evaluation if the symptoms or condition worsens.  Duration of encounter: 30 minutes.  Note by: Tiffany Cola, MD Date: 03/17/2021; Time: 4:12 PM

## 2021-03-17 NOTE — Progress Notes (Signed)
Nursing Pain Medication Assessment:  ?Safety precautions to be maintained throughout the outpatient stay will include: orient to surroundings, keep bed in low position, maintain call bell within reach at all times, provide assistance with transfer out of bed and ambulation.  ?Medication Inspection Compliance: Pill count conducted under aseptic conditions, in front of the patient. Neither the pills nor the bottle was removed from the patient's sight at any time. Once count was completed pills were immediately returned to the patient in their original bottle. ? ?Medication: Morphine ER (MSContin) ?Pill/Patch Count:  42 of 60 pills remain ?Pill/Patch Appearance: Markings consistent with prescribed medication ?Bottle Appearance: Standard pharmacy container. Clearly labeled. ?Filled Date: 02 / 22 / 2023 ?Last Medication intake:  Today ?

## 2021-03-17 NOTE — Patient Instructions (Signed)
____________________________________________________________________________________________ ° °Medication Rules ° °Purpose: To inform patients, and their family members, of our rules and regulations. ° °Applies to: All patients receiving prescriptions (written or electronic). ° °Pharmacy of record: Pharmacy where electronic prescriptions will be sent. If written prescriptions are taken to a different pharmacy, please inform the nursing staff. The pharmacy listed in the electronic medical record should be the one where you would like electronic prescriptions to be sent. ° °Electronic prescriptions: In compliance with the [place] [employer] Opioid Misuse Prevention (STOP) Act of 2017 (Session Law 2017-74[place]H243), effective January 09, 2018, all controlled substances must be electronically prescribed. Calling prescriptions to the pharmacy will cease to exist. ° °Prescription refills: Only during scheduled appointments. Applies to all prescriptions. ° °NOTE: The following applies primarily to controlled substances (Opioid* Pain Medications).  ° °Type of encounter (visit): For patients receiving controlled substances, face-to-face visits are required. (Not an option or up to the patient.) ° °Patient's responsibilities: °Pain Pills: Bring all pain pills to every appointment (except for procedure appointments). °Pill Bottles: Bring pills in original pharmacy bottle. Always bring the newest bottle. Bring bottle, even if empty. °Medication refills: You are responsible for knowing and keeping track of what medications you take and those you need refilled. °The day before your appointment: write a list of all prescriptions that need to be refilled. °The day of the appointment: give the list to the admitting nurse. Prescriptions will be written only during appointments. No prescriptions will be written on procedure days. °If you forget a medication: it will not be "Called in", "Faxed", or "electronically sent". You will  need to get another appointment to get these prescribed. °No early refills. Do not call asking to have your prescription filled early. °Prescription Accuracy: You are responsible for carefully inspecting your prescriptions before leaving our office. Have the discharge nurse carefully go over each prescription with you, before taking them home. Make sure that your name is accurately spelled, that your address is correct. Check the name and dose of your medication to make sure it is accurate. Check the number of pills, and the written instructions to make sure they are clear and accurate. Make sure that you are given enough medication to last until your next medication refill appointment. °Taking Medication: Take medication as prescribed. When it comes to controlled substances, taking less pills or less frequently than prescribed is permitted and encouraged. °Never take more pills than instructed. °Never take medication more frequently than prescribed.  °Inform other Doctors: Always inform, all of your healthcare providers, of all the medications you take. °Pain Medication from other Providers: You are not allowed to accept any additional pain medication from any other Doctor or Healthcare provider. There are two exceptions to this rule. (see below) In the event that you require additional pain medication, you are responsible for notifying us, as stated below. °Cough Medicine: Often these contain an opioid, such as codeine or hydrocodone. Never accept or take cough medicine containing these opioids if you are already taking an opioid* medication. The combination may cause respiratory failure and death. °Medication Agreement: You are responsible for carefully reading and following our Medication Agreement. This must be signed before receiving any prescriptions from our practice. Safely store a copy of your signed Agreement. Violations to the Agreement will result in no further prescriptions. (Additional copies of our  Medication Agreement are available upon request.) °Laws, Rules, & Regulations: All patients are expected to follow all Federal and State Laws, Statutes, Rules, & Regulations. Ignorance of   the Laws does not constitute a valid excuse.  °Illegal drugs and Controlled Substances: The use of illegal substances (including, but not limited to marijuana and its derivatives) and/or the illegal use of any controlled substances is strictly prohibited. Violation of this rule may result in the immediate and permanent discontinuation of any and all prescriptions being written by our practice. The use of any illegal substances is prohibited. °Adopted CDC guidelines & recommendations: Target dosing levels will be at or below 60 MME/day. Use of benzodiazepines** is not recommended. ° °Exceptions: There are only two exceptions to the rule of not receiving pain medications from other Healthcare Providers. °Exception #1 (Emergencies): In the event of an emergency (i.e.: accident requiring emergency care), you are allowed to receive additional pain medication. However, you are responsible for: As soon as you are able, call our office [phone], at any time of the day or night, and leave a message stating your name, the date and nature of the emergency, and the name and dose of the medication prescribed. In the event that your call is answered by a member of our staff, make sure to document and save the date, time, and the name of the person that took your information.  °Exception #2 (Planned Surgery): In the event that you are scheduled by another doctor or dentist to have any type of surgery or procedure, you are allowed (for a period no longer than 30 days), to receive additional pain medication, for the acute post-op pain. However, in this case, you are responsible for picking up a copy of our "Post-op Pain Management for Surgeons" handout, and giving it to your surgeon or dentist. This document is available at our office, and  does not require an appointment to obtain it. Simply go to our office during business hours (Monday-Thursday from 8:00 AM to 4:00 PM) (Friday 8:00 AM to 12:00 Noon) or if you have a scheduled appointment with us, prior to your surgery, and ask for it by name. In addition, you are responsible for: calling our office [phone], at any time of the day or night, and leaving a message stating your name, name of your surgeon, type of surgery, and date of procedure or surgery. Failure to comply with your responsibilities may result in termination of therapy involving the controlled substances. °Medication Agreement Violation. Following the above rules, including your responsibilities will help you in avoiding a Medication Agreement Violation (“Breaking your Pain Medication Contract”). ° °*Opioid medications include: morphine, codeine, oxycodone, oxymorphone, hydrocodone, hydromorphone, meperidine, tramadol, tapentadol, buprenorphine, fentanyl, methadone. °**Benzodiazepine medications include: diazepam (Valium), alprazolam (Xanax), clonazepam (Klonopine), lorazepam (Ativan), clorazepate (Tranxene), chlordiazepoxide (Librium), estazolam (Prosom), oxazepam (Serax), temazepam (Restoril), triazolam (Halcion) °(Last updated: 10/06/2020) °____________________________________________________________________________________________ ° ____________________________________________________________________________________________ ° °Medication Recommendations and Reminders ° °Applies to: All patients receiving prescriptions (written and/or electronic). ° °Medication Rules & Regulations: These rules and regulations exist for your safety and that of others. They are not flexible and neither are we. Dismissing or ignoring them will be considered "non-compliance" with medication therapy, resulting in complete and irreversible termination of such therapy. (See document titled "Medication Rules" for more details.) In all conscience,  because of safety reasons, we cannot continue providing a therapy where the patient does not follow instructions. ° °Pharmacy of record:  °Definition: This is the pharmacy where your electronic prescriptions will be sent.  °We do not endorse any particular pharmacy, however, we have experienced problems with [facility] not securing enough medication supply for the community. °We do not restrict you   in your choice of pharmacy. However, once we write for your prescriptions, we will NOT be re-sending more prescriptions to fix restricted supply problems created by your pharmacy, or your insurance.  °The pharmacy listed in the electronic medical record should be the one where you want electronic prescriptions to be sent. °If you choose to change pharmacy, simply notify our nursing staff. ° °Recommendations: °Keep all of your pain medications in a safe place, under lock and key, even if you live alone. We will NOT replace lost, stolen, or damaged medication. °After you fill your prescription, take 1 week's worth of pills and put them away in a safe place. You should keep a separate, properly labeled bottle for this purpose. The remainder should be kept in the original bottle. Use this as your primary supply, until it runs out. Once it's gone, then you know that you have 1 week's worth of medicine, and it is time to come in for a prescription refill. If you do this correctly, it is unlikely that you will ever run out of medicine. °To make sure that the above recommendation works, it is very important that you make sure your medication refill appointments are scheduled at least 1 week before you run out of medicine. To do this in an effective manner, make sure that you do not leave the office without scheduling your next medication management appointment. Always ask the nursing staff to show you in your prescription , when your medication will be running out. Then arrange for the receptionist to get you a return appointment,  at least 7 days before you run out of medicine. Do not wait until you have 1 or 2 pills left, to come in. This is very poor planning and does not take into consideration that we may need to cancel appointments due to bad weather, sickness, or emergencies affecting our staff. °DO NOT ACCEPT A "Partial Fill": If for any reason your pharmacy does not have enough pills/tablets to completely fill or refill your prescription, do not allow for a "partial fill". The law allows the pharmacy to complete that prescription within 72 hours, without requiring a new prescription. If they do not fill the rest of your prescription within those 72 hours, you will need a separate prescription to fill the remaining amount, which we will NOT provide. If the reason for the partial fill is your insurance, you will need to talk to the pharmacist about payment alternatives for the remaining tablets, but again, DO NOT ACCEPT A PARTIAL FILL, unless you can trust your pharmacist to obtain the remainder of the pills within 72 hours. ° °Prescription refills and/or changes in medication(s):  °Prescription refills, and/or changes in dose or medication, will be conducted only during scheduled medication management appointments. (Applies to both, written and electronic prescriptions.) °No refills on procedure days. No medication will be changed or started on procedure days. No changes, adjustments, and/or refills will be conducted on a procedure day. Doing so will interfere with the diagnostic portion of the procedure. °No phone refills. No medications will be "called into the pharmacy". °No Fax refills. °No weekend refills. °No Holliday refills. °No after hours refills. ° °Remember:  °Business hours are:  °Monday to Thursday 8:00 AM to 4:00 PM °Provider's Schedule: °Kalie Cabral, MD - Appointments are:  °Medication management: Monday and Wednesday 8:00 AM to 4:00 PM °Procedure day: Tuesday and Thursday 7:30 AM to 4:00 PM °Bilal Lateef, MD -  Appointments are:  °Medication management: Tuesday and Thursday 8:00   AM to 4:00 PM °Procedure day: Monday and Wednesday 7:30 AM to 4:00 PM °(Last update: 07/30/2019) °____________________________________________________________________________________________ ° ____________________________________________________________________________________________ ° °CBD (cannabidiol) & Delta-8 (Delta-8 tetrahydrocannabinol) WARNING ° °Intro: Cannabidiol (CBD) and tetrahydrocannabinol (THC), are two natural compounds found in plants of the Cannabis genus. They can both be extracted from hemp or cannabis. Hemp and cannabis come from the Cannabis sativa plant. Both compounds interact with your body’s endocannabinoid system, but they have very different effects. CBD does not produce the high sensation associated with cannabis. Delta-8 tetrahydrocannabinol, also known as delta-8 THC, is a psychoactive substance found in the Cannabis sativa plant, of which marijuana and hemp are two varieties. THC is responsible for the high associated with the illicit use of marijuana. ° °Applicable to: All individuals currently taking or considering taking CBD (cannabidiol) and, more important, all patients taking opioid analgesic controlled substances (pain medication). (Example: oxycodone; oxymorphone; hydrocodone; hydromorphone; morphine; methadone; tramadol; tapentadol; fentanyl; buprenorphine; butorphanol; dextromethorphan; meperidine; codeine; etc.) ° °Legal status: CBD remains a Schedule I drug prohibited for any use. CBD is illegal with one exception. In the [place], CBD has a limited Food and Drug Administration (FDA) approval for the treatment of two specific types of epilepsy disorders. Only one CBD product has been approved by the FDA for this purpose: "Epidiolex". FDA is aware that some companies are marketing products containing cannabis and cannabis-derived compounds in ways that violate the Federal Food, Drug and Cosmetic Act  (FD&C Act) and that may put the health and safety of consumers at risk. The FDA, a Federal agency, has not enforced the CBD status since 2018. UPDATE: (02/25/2021) The Drug Enforcement Agency (DEA) issued a letter stating that "delta" cannabinoids, including Delta-8-THCO and Delta-9-THCO, synthetically derived from hemp do not qualify as hemp and will be viewed as Schedule I drugs. (Schedule I drugs, substances, or chemicals are defined as drugs with no currently accepted medical use and a high potential for abuse. Some examples of Schedule I drugs are: heroin, lysergic acid diethylamide (LSD), marijuana (cannabis), 3,4-methylenedioxymethamphetamine (ecstasy), methaqualone, and peyote.) (https://www.dea.gov) ° °Legality: Some manufacturers ship CBD products nationally, which is illegal. Often such products are sold online and are therefore available throughout the country. CBD is openly sold in head shops and health food stores in some states where such sales have not been explicitly legalized. Selling unapproved products with unsubstantiated therapeutic claims is not only a violation of the law, but also can put patients at risk, as these products have not been proven to be safe or effective. Federal illegality makes it difficult to conduct research on CBD. ° °Reference: "FDA Regulation of Cannabis and Cannabis-Derived Products, Including Cannabidiol (CBD)" - https://www.fda.gov/news-events/public-health-focus/fda-regulation-cannabis-and-cannabis-derived-products-including-cannabidiol-cbd ° °Warning: CBD is not FDA approved and has not undergo the same manufacturing controls as prescription drugs.  This means that the purity and safety of available CBD may be questionable. Most of the time, despite manufacturer's claims, it is contaminated with THC (delta-9-tetrahydrocannabinol - the chemical in marijuana responsible for the "HIGH").  When this is the case, the THC contaminant will trigger a positive urine drug  screen (UDS) test for Marijuana (carboxy-THC). Because a positive UDS for any illicit substance is a violation of our medication agreement, your opioid analgesics (pain medicine) may be permanently discontinued. °The FDA recently put out a warning about 5 things that everyone should be aware of regarding Delta-8 THC: °Delta-8 THC products have not been evaluated or approved by the FDA for safe use and may be marketed in ways that put the   public health at risk. °The FDA has received adverse event reports involving delta-8 THC-containing products. °Delta-8 THC has psychoactive and intoxicating effects. °Delta-8 THC manufacturing often involve use of potentially harmful chemicals to create the concentrations of delta-8 THC claimed in the marketplace. The final delta-8 THC product may have potentially harmful by-products (contaminants) due to the chemicals used in the process. Manufacturing of delta-8 THC products may occur in uncontrolled or unsanitary settings, which may lead to the presence of unsafe contaminants or other potentially harmful substances. °Delta-8 THC products should be kept out of the reach of children and pets. ° °MORE ABOUT CBD ° °General Information: CBD was discovered in 1940 and it is a derivative of the cannabis sativa genus plants (Marijuana and Hemp). It is one of the 113 identified substances found in Marijuana. It accounts for up to 40% of the plant's extract. As of 2018, preliminary clinical studies on CBD included research for the treatment of anxiety, movement disorders, and pain. CBD is available and consumed in multiple forms, including inhalation of smoke or vapor, as an aerosol spray, and by mouth. It may be supplied as an oil containing CBD, capsules, dried cannabis, or as a liquid solution. CBD is thought not to be as psychoactive as THC (delta-9-tetrahydrocannabinol - the chemical in marijuana responsible for the "HIGH"). Studies suggest that CBD may interact with different  biological target receptors in the body, including cannabinoid and other neurotransmitter receptors. As of 2018 the mechanism of action for its biological effects has not been determined. ° °Side-effects   Adverse reactions: Dry mouth, diarrhea, decreased appetite, fatigue, drowsiness, malaise, weakness, sleep disturbances, and others. ° °Drug interactions: CBC may interact with other medications such as blood-thinners. Because CBD causes drowsiness on its own, it also increases the drowsiness caused by other medications, including antihistamines (such as Benadryl), benzodiazepines (Xanax, Ativan, Valium), antipsychotics, antidepressants and opioids, as well as alcohol and supplements such as kava, melatonin and St. John's Wort. Be cautious with the following combinations:  ° °Brivaracetam (Briviact) °Brivaracetam is changed and broken down by the body. CBD might decrease how quickly the body breaks down brivaracetam. This might increase levels of brivaracetam in the body. ° °Caffeine °Caffeine is changed and broken down by the body. CBD might decrease how quickly the body breaks down caffeine. This might increase levels of caffeine in the body. ° °Carbamazepine (Tegretol) °Carbamazepine is changed and broken down by the body. CBD might decrease how quickly the body breaks down carbamazepine. This might increase levels of carbamazepine in the body and increase its side effects. ° °Citalopram (Celexa) °Citalopram is changed and broken down by the body. CBD might decrease how quickly the body breaks down citalopram. This might increase levels of citalopram in the body and increase its side effects. ° °Clobazam (Onfi) °Clobazam is changed and broken down by the liver. CBD might decrease how quickly the liver breaks down clobazam. This might increase the effects and side effects of clobazam. ° °Eslicarbazepine (Aptiom) °Eslicarbazepine is changed and broken down by the body. CBD might decrease how quickly the body  breaks down eslicarbazepine. This might increase levels of eslicarbazepine in the body by a small amount. ° °Everolimus (Zostress) °Everolimus is changed and broken down by the body. CBD might decrease how quickly the body breaks down everolimus. This might increase levels of everolimus in the body. ° °Lithium °Taking higher doses of CBD might increase levels of lithium. This can increase the risk of lithium toxicity. ° °Medications changed by the   liver (Cytochrome P450 1A1 (CYP1A1) substrates) °Some medications are changed and broken down by the liver. CBD might change how quickly the liver breaks down these medications. This could change the effects and side effects of these medications. ° °Medications changed by the liver (Cytochrome P450 1A2 (CYP1A2) substrates) °Some medications are changed and broken down by the liver. CBD might change how quickly the liver breaks down these medications. This could change the effects and side effects of these medications. ° °Medications changed by the liver (Cytochrome P450 1B1 (CYP1B1) substrates) °Some medications are changed and broken down by the liver. CBD might change how quickly the liver breaks down these medications. This could change the effects and side effects of these medications. ° °Medications changed by the liver (Cytochrome P450 2A6 (CYP2A6) substrates) °Some medications are changed and broken down by the liver. CBD might change how quickly the liver breaks down these medications. This could change the effects and side effects of these medications. ° °Medications changed by the liver (Cytochrome P450 2B6 (CYP2B6) substrates) °Some medications are changed and broken down by the liver. CBD might change how quickly the liver breaks down these medications. This could change the effects and side effects of these medications. ° °Medications changed by the liver (Cytochrome P450 2C19 (CYP2C19) substrates) °Some medications are changed and broken down by the liver.  CBD might change how quickly the liver breaks down these medications. This could change the effects and side effects of these medications. ° °Medications changed by the liver (Cytochrome P450 2C8 (CYP2C8) substrates) °Some medications are changed and broken down by the liver. CBD might change how quickly the liver breaks down these medications. This could change the effects and side effects of these medications. ° °Medications changed by the liver (Cytochrome P450 2C9 (CYP2C9) substrates) °Some medications are changed and broken down by the liver. CBD might change how quickly the liver breaks down these medications. This could change the effects and side effects of these medications. ° °Medications changed by the liver (Cytochrome P450 2D6 (CYP2D6) substrates) °Some medications are changed and broken down by the liver. CBD might change how quickly the liver breaks down these medications. This could change the effects and side effects of these medications. ° °Medications changed by the liver (Cytochrome P450 2E1 (CYP2E1) substrates) °Some medications are changed and broken down by the liver. CBD might change how quickly the liver breaks down these medications. This could change the effects and side effects of these medications. ° °Medications changed by the liver (Cytochrome P450 3A4 (CYP3A4) substrates) °Some medications are changed and broken down by the liver. CBD might change how quickly the liver breaks down these medications. This could change the effects and side effects of these medications. ° °Medications changed by the liver (Glucuronidated drugs) °Some medications are changed and broken down by the liver. CBD might change how quickly the liver breaks down these medications. This could change the effects and side effects of these medications. ° °Medications that decrease the breakdown of other medications by the liver (Cytochrome P450 2C19 (CYP2C19) inhibitors) °CBD is changed and broken down by the liver.  Some drugs decrease how quickly the liver changes and breaks down CBD. This could change the effects and side effects of CBD. ° °Medications that decrease the breakdown of other medications in the liver (Cytochrome P450 3A4 (CYP3A4) inhibitors) °CBD is changed and broken down by the liver. Some drugs decrease how quickly the liver changes and breaks down CBD. This could change the   effects and side effects of CBD. ° °Medications that increase breakdown of other medications by the liver (Cytochrome P450 3A4 (CYP3A4) inducers) °CBD is changed and broken down by the liver. Some drugs increase how quickly the liver changes and breaks down CBD. This could change the effects and side effects of CBD. ° °Medications that increase the breakdown of other medications by the liver (Cytochrome P450 2C19 (CYP2C19) inducers) °CBD is changed and broken down by the liver. Some drugs increase how quickly the liver changes and breaks down CBD. This could change the effects and side effects of CBD. ° °Methadone (Dolophine) °Methadone is broken down by the liver. CBD might decrease how quickly the liver breaks down methadone. Taking cannabidiol along with methadone might increase the effects and side effects of methadone. ° °Rufinamide (Banzel) °Rufinamide is changed and broken down by the body. CBD might decrease how quickly the body breaks down rufinamide. This might increase levels of rufinamide in the body by a small amount. ° °Sedative medications (CNS depressants) °CBD might cause sleepiness and slowed breathing. Some medications, called sedatives, can also cause sleepiness and slowed breathing. Taking CBD with sedative medications might cause breathing problems and/or too much sleepiness. ° °Sirolimus (Rapamune) °Sirolimus is changed and broken down by the body. CBD might decrease how quickly the body breaks down sirolimus. This might increase levels of sirolimus in the body. ° °Stiripentol (Diacomit) °Stiripentol is changed and  broken down by the body. CBD might decrease how quickly the body breaks down stiripentol. This might increase levels of stiripentol in the body and increase its side effects. ° °Tacrolimus (Prograf) °Tacrolimus is changed and broken down by the body. CBD might decrease how quickly the body breaks down tacrolimus. This might increase levels of tacrolimus in the body. ° °Tamoxifen (Soltamox) °Tamoxifen is changed and broken down by the body. CBD might affect how quickly the body breaks down tamoxifen. This might affect levels of tamoxifen in the body. ° °Topiramate (Topamax) °Topiramate is changed and broken down by the body. CBD might decrease how quickly the body breaks down topiramate. This might increase levels of topiramate in the body by a small amount. ° °Valproate °Valproic acid can cause liver injury. Taking cannabidiol with valproic acid might increase the chance of liver injury. CBD and/or valproic acid might need to be stopped, or the dose might need to be reduced. ° °Warfarin (Coumadin) °CBD might increase levels of warfarin, which can increase the risk for bleeding. CBD and/or warfarin might need to be stopped, or the dose might need to be reduced. ° °Zonisamide °Zonisamide is changed and broken down by the body. CBD might decrease how quickly the body breaks down zonisamide. This might increase levels of zonisamide in the body by a small amount. °(Last update: 03/09/2021) °____________________________________________________________________________________________ ° ____________________________________________________________________________________________ ° °Drug Holidays (Slow) ° °What is a "Drug Holiday"? °Drug Holiday: is the name given to the period of time during which a patient stops taking a medication(s) for the purpose of eliminating tolerance to the drug. ° °Benefits °Improved effectiveness of opioids. °Decreased opioid dose needed to achieve benefits. °Improved pain with lesser  dose. ° °What is tolerance? °Tolerance: is the progressive decreased in effectiveness of a drug due to its repetitive use. With repetitive use, the body gets use to the medication and as a consequence, it loses its effectiveness. This is a common problem seen with opioid pain medications. As a result, a larger dose of the drug is needed to achieve the same effect   that used to be obtained with a smaller dose. ° °How long should a "Drug Holiday" last? °You should stay off of the pain medicine for at least 14 consecutive days. (2 weeks) ° °Should I stop the medicine "cold turkey"? °No. You should always coordinate with your Pain Specialist so that he/she can provide you with the correct medication dose to make the transition as smoothly as possible. ° °How do I stop the medicine? °Slowly. You will be instructed to decrease the daily amount of pills that you take by one (1) pill every seven (7) days. This is called a "slow downward taper" of your dose. For example: if you normally take four (4) pills per day, you will be asked to drop this dose to three (3) pills per day for seven (7) days, then to two (2) pills per day for seven (7) days, then to one (1) per day for seven (7) days, and at the end of those last seven (7) days, this is when the "Drug Holiday" would start.  ° °Will I have withdrawals? °By doing a "slow downward taper" like this one, it is unlikely that you will experience any significant withdrawal symptoms. Typically, what triggers withdrawals is the sudden stop of a high dose opioid therapy. Withdrawals can usually be avoided by slowly decreasing the dose over a prolonged period of time. If you do not follow these instructions and decide to stop your medication abruptly, withdrawals may be possible. ° °What are withdrawals? °Withdrawals: refers to the wide range of symptoms that occur after stopping or dramatically reducing opiate drugs after heavy and prolonged use. Withdrawal symptoms do not occur to  patients that use low dose opioids, or those who take the medication sporadically. Contrary to benzodiazepine (example: Valium, Xanax, etc.) or alcohol withdrawals (“Delirium Tremens”), opioid withdrawals are not lethal. Withdrawals are the physical manifestation of the body getting rid of the excess receptors. ° °Expected Symptoms °Early symptoms of withdrawal may include: °Agitation °Anxiety °Muscle aches °Increased tearing °Insomnia °Runny nose °Sweating °Yawning ° °Late symptoms of withdrawal may include: °Abdominal cramping °Diarrhea °Dilated pupils °Goose bumps °Nausea °Vomiting ° °Will I experience withdrawals? °Due to the slow nature of the taper, it is very unlikely that you will experience any. ° °What is a slow taper? °Taper: refers to the gradual decrease in dose.  °(Last update: 07/30/2019) °____________________________________________________________________________________________ ° °  °

## 2021-03-30 ENCOUNTER — Other Ambulatory Visit: Payer: Self-pay | Admitting: Pain Medicine

## 2021-03-30 DIAGNOSIS — G8929 Other chronic pain: Secondary | ICD-10-CM

## 2021-03-30 DIAGNOSIS — G894 Chronic pain syndrome: Secondary | ICD-10-CM

## 2021-03-30 DIAGNOSIS — G35 Multiple sclerosis: Secondary | ICD-10-CM

## 2021-03-30 DIAGNOSIS — M5137 Other intervertebral disc degeneration, lumbosacral region: Secondary | ICD-10-CM

## 2021-03-30 DIAGNOSIS — M159 Polyosteoarthritis, unspecified: Secondary | ICD-10-CM

## 2021-03-30 DIAGNOSIS — M961 Postlaminectomy syndrome, not elsewhere classified: Secondary | ICD-10-CM

## 2021-03-30 DIAGNOSIS — Z79899 Other long term (current) drug therapy: Secondary | ICD-10-CM

## 2021-03-30 DIAGNOSIS — Z79891 Long term (current) use of opiate analgesic: Secondary | ICD-10-CM

## 2021-03-30 DIAGNOSIS — M47816 Spondylosis without myelopathy or radiculopathy, lumbar region: Secondary | ICD-10-CM

## 2021-03-30 DIAGNOSIS — M797 Fibromyalgia: Secondary | ICD-10-CM

## 2021-06-14 NOTE — Progress Notes (Signed)
PROVIDER NOTE: Information contained herein reflects review and annotations entered in association with encounter. Interpretation of such information and data should be left to medically-trained personnel. Information provided to patient can be located elsewhere in the medical record under "Patient Instructions". Document created using STT-dictation technology, any transcriptional errors that may result from process are unintentional.    Patient: Tiffany Herring  Service Category: E/M  Provider: Oswaldo Done, MD  DOB: 05-May-1960  DOS: 06/15/2021  Specialty: Interventional Pain Management  MRN: [id number]  Setting: Ambulatory outpatient  PCP: Rolm Gala, MD  Type: Established Patient    Referring Provider: Rolm Gala, MD  Location: Office  Delivery: Face-to-face     HPI  [name], a 61 y.o. year old female, is here today because of her Chronic pain syndrome [G89.4]. Ms. Sill primary complain today is Leg Pain (left) Last encounter: My last encounter with her was on 03/30/2021. Pertinent problems: Ms. Allton has Failed back surgical syndrome; Epidural fibrosis; Chronic low back pain (Bilateral) w/ sciatica (Right); Chronic lumbar radicular pain (Right); Generalized OA; Multiple sclerosis (HCC); Degenerative arthritis of hip; Fibromyalgia; Lumbar spondylosis; Grade 1 Anterolisthesis of L4/L5; Chronic sacroiliac joint pain (bilateral); Chronic hip pain (Left); Arthropathy of left hip (Severe chronic left hip DJD); Osteoarthritis of hip (Left); Lumbar facet arthropathy (HCC); Lumbar facet hypertrophy; Lumbar facet syndrome (Bilateral); Chronic flank pain (Right); Chronic lower extremity pain (Right); Chronic pain syndrome; Venous stasis ulcer (HCC); OA (osteoarthritis); Osteoarthrosis, hip; Generalized osteoarthritis of multiple sites; and DDD (degenerative disc disease), lumbosacral on their pertinent problem list. Pain Assessment: Severity of Chronic pain is reported as a 4  /10. Location: Leg Left/radiates from left hip to left knee  in the back around to the front of knee. Onset: More than a month ago. Quality: Stabbing. Timing: Constant. Modifying factor(s): meds. Vitals:  height is 5\' 1"  (1.549 m) and weight is 304 lb (137.9 kg) (abnormal). Her temperature is 97.3 F (36.3 C) (abnormal). Her blood pressure is 140/78 and her pulse is 76. Her respiration is 18 and oxygen saturation is 100%.   Reason for encounter: medication management.  The patient indicates doing well with the current medication regimen. No adverse reactions or side effects reported to the medications.   UDS ordered today.   RTCB: 09/13/2021  Pharmacotherapy Assessment  Analgesic: MSIR 15 mg, 1 tab PO TID. "Drug Holiday" completed on 02/23/2021.  Micah Flesher from MME of 110 mg/day to 45 mg/day). MME/day: 45 mg/day   Monitoring: [place] PMP: PDMP reviewed during this encounter.       Pharmacotherapy: No side-effects or adverse reactions reported. Compliance: No problems identified. Effectiveness: Clinically acceptable.  Valerie Salts, RN  06/15/2021 12:57 PM  Sign when Signing Visit Nursing Pain Medication Assessment:  Safety precautions to be maintained throughout the outpatient stay will include: orient to surroundings, keep bed in low position, maintain call bell within reach at all times, provide assistance with transfer out of bed and ambulation.  Medication Inspection Compliance: Pill count conducted under aseptic conditions, in front of the patient. Neither the pills nor the bottle was removed from the patient's sight at any time. Once count was completed pills were immediately returned to the patient in their original bottle.  Medication: Morphine IR Pill/Patch Count:  5 of 90 pills remain Pill/Patch Appearance: Markings consistent with prescribed medication Bottle Appearance: Standard pharmacy container. Clearly labeled. Filled Date:05  / 08 / 2023 Last Medication intake:  Today    UDS:   Summary  Date Value Ref Range Status  05/27/2020 Note  Final    Comment:    ==================================================================== [employer] Select 13 (MW) ==================================================================== Test                             Result       Flag       Units  Drug Present and Declared for Prescription Verification   Morphine                       >[id number]        EXPECTED   ng/mg creat   Normorphine                    263          EXPECTED   ng/mg creat    Potential sources of large amounts of morphine in the absence of    codeine include administration of morphine or use of heroin.     Normorphine is an expected metabolite of morphine.    Hydrocodone                    778          EXPECTED   ng/mg creat   Hydromorphone                  155          EXPECTED   ng/mg creat   Dihydrocodeine                 55           EXPECTED   ng/mg creat   Norhydrocodone                 952          EXPECTED   ng/mg creat    Sources of hydrocodone include scheduled prescription medications.    Hydromorphone, dihydrocodeine and norhydrocodone are expected    metabolites of hydrocodone. Hydromorphone and dihydrocodeine are    also available as scheduled prescription medications.  ==================================================================== Test                      Result    Flag   Units      Ref Range   Creatinine              130              mg/dL      >=16 ==================================================================== Declared Medications:  The flagging and interpretation on this report are based on the  following declared medications.  Unexpected results may arise from  inaccuracies in the declared medications.   **Note: The testing scope of this panel includes these medications:   Hydrocodone (Norco)  Morphine (MS Contin)   **Note: The testing scope of this panel does not include the  following reported medications:    Acetaminophen (Norco)  Insulin  Mirtazapine (Remeron)  Montelukast (Singulair)  Naloxone (Narcan)  Oxybutynin (Ditropan)  Tizanidine (Zanaflex) ==================================================================== For clinical consultation, please call [phone]. ====================================================================      ROS  Constitutional: Denies any fever or chills Gastrointestinal: No reported hemesis, hematochezia, vomiting, or acute GI distress Musculoskeletal: Denies any acute onset joint swelling, redness, loss of ROM, or weakness Neurological: No reported episodes of acute onset apraxia, aphasia, dysarthria, agnosia, amnesia, paralysis, loss of coordination, or loss of consciousness  Medication  Review  BLOOD GLUCOSE TEST STRIPS, Fifty50 Glucose Meter 2.0, Insulin Syringe-Needle U-100, accu-chek multiclix, albuterol, aspirin, buPROPion, clotrimazole, enalapril, furosemide, gabapentin, glucose blood, insulin regular, mirtazapine, montelukast, morphine, naloxone, oxyCODONE, oxybutynin, tiZANidine, timolol, and vitamin B-12  History Review  Allergy: Ms. Klamm is allergic to amoxicillin-pot clavulanate, copaxone  [glatiramer acetate], pseudoephedrine, rofecoxib, sulfa antibiotics, sulfamethoxazole-trimethoprim, and meloxicam. Drug: Ms. [name]  reports no history of drug use. Alcohol:  reports no history of alcohol use. Tobacco:  reports that she has quit smoking. She has never used smokeless tobacco. Social: Ms. Cano  reports that she has quit smoking. She has never used smokeless tobacco. She reports that she does not drink alcohol and does not use drugs. Medical:  has a past medical history of Allergy, Arthritis, Asthma, Chronic kidney disease, Depression, Diabetes mellitus without complication (HCC), Hypertension, Multiple sclerosis (HCC), Neuromuscular disorder (HCC), and Sleep apnea. Surgical: Ms. Upton  has a past surgical history that includes  Cesarean section (1991 and 1996). Family: family history includes Dementia in her mother; Depression in her mother; Diabetes in her mother; Hyperlipidemia in her mother; Stroke in her father.  Laboratory Chemistry Profile   Renal Lab Results  Component Value Date   BUN 18 02/06/2018   CREATININE 1.22 (H) 02/06/2018   BCR 15 02/06/2018   GFRAA 56 (L) 02/06/2018   GFRNONAA 49 (L) 02/06/2018    Hepatic Lab Results  Component Value Date   AST 25 02/06/2018   ALT 19 02/15/2015   ALBUMIN 4.6 02/06/2018   ALKPHOS 97 02/06/2018    Electrolytes Lab Results  Component Value Date   NA 136 02/06/2018   K 4.9 02/06/2018   CL 95 (L) 02/06/2018   CALCIUM 9.8 02/06/2018   MG 1.8 02/06/2018    Bone Lab Results  Component Value Date   25OHVITD1 55 02/06/2018   25OHVITD2 2.9 02/06/2018   25OHVITD3 52 02/06/2018    Inflammation (CRP: Acute Phase) (ESR: Chronic Phase) Lab Results  Component Value Date   CRP 17 (H) 02/06/2018   ESRSEDRATE 28 02/06/2018         Note: Above Lab results reviewed.  Recent Imaging Review  US Abdomen Complete * PRIOR REPORT IMPORTED FROM AN EXTERNAL SYSTEM *   PRIOR REPORT IMPORTED FROM THE [facility] WORKFLOW SYSTEM   REASON FOR EXAM:    Enlarged Liver Spleen  COMMENTS:   PROCEDURE:     [place]  - US ABDOMEN GENERAL SURVEY  - Jul 29 2012 10:20AM   RESULT:     The liver is enlarged. It demonstrates increased echo texture  consistent with fatty infiltration. Portal venous flow is normal in  direction toward the liver. There is splenomegaly with maximal dimension  of  the spleen of 16.2 cm.   The gallbladder is adequately distended with no evidence of stones, wall  thickening, or pericholecystic fluid. There is no positive sonographic  Murphy's sign. The common bile duct measures 5.5 mm in diameter.   Evaluation the pancreas is limited by bowel gas. Similarly the abdominal  aorta is only partially visualized. The inferior vena cava and kidneys are   normal in appearance.   IMPRESSION:  1. There is hepatosplenomegaly.  2. The gallbladder and common bile duct and observed portions of the  pancreas appear normal.  3. The kidneys exhibit no evidence of obstruction nor other acute  abnormality.   Dictation Site: 2    Note: Reviewed        Physical Exam  General appearance: Well nourished, well  developed, and well hydrated. In no apparent acute distress Mental status: Alert, oriented x 3 (person, place, & time)       Respiratory: No evidence of acute respiratory distress Eyes: PERLA Vitals: BP 140/78   Pulse 76   Temp (!) 97.3 F (36.3 C)   Resp 18   Ht 5\' 1"  (1.549 m)   Wt (!) 304 lb (137.9 kg)   SpO2 100%   BMI 57.44 kg/m  BMI: Estimated body mass index is 57.44 kg/m as calculated from the following:   Height as of this encounter: 5\' 1"  (1.549 m).   Weight as of this encounter: 304 lb (137.9 kg). Ideal: Ideal body weight: 47.8 kg (105 lb 6.1 oz) Adjusted ideal body weight: 83.8 kg (184 lb 13.2 oz)  Assessment   Diagnosis Status  1. Chronic pain syndrome   2. Multiple sclerosis (HCC)   3. Failed back surgical syndrome   4. Chronic low back pain (Bilateral) w/ sciatica (Right)   5. Chronic lower extremity pain (Right)   6. Chronic hip pain (Left)   7. Chronic sacroiliac joint pain (bilateral)   8. Fibromyalgia   9. Lumbar facet syndrome (Bilateral)   10. Generalized osteoarthritis of multiple sites   11. DDD (degenerative disc disease), lumbosacral   12. Pharmacologic therapy   13. Chronic use of opiate for therapeutic purpose   14. Encounter for medication management   15. Encounter for chronic pain management    Controlled Controlled Controlled   Updated Problems: No problems updated.  Plan of Care  Problem-specific:  No problem-specific Assessment & Plan notes found for this encounter.  Ms. Tahya Raak has a current medication list which includes the following long-term medication(s):  albuterol, bupropion, furosemide, gabapentin, insulin regular, mirtazapine, montelukast, morphine, [START ON 07/15/2021] morphine, [START ON 08/14/2021] morphine, and [DISCONTINUED] oxycodone.  Pharmacotherapy (Medications Ordered): Meds ordered this encounter  Medications   morphine (MSIR) 15 MG tablet    Sig: Take 1 tablet (15 mg total) by mouth every 8 (eight) hours. Must last 30 days.    Dispense:  90 tablet    Refill:  0    DO NOT: delete (not duplicate); no partial-fill (will deny script to complete), no refill request (F/U required). DISPENSE: 1 day early if closed on fill date. WARN: No CNS-depressants within 8 hrs of med.   morphine (MSIR) 15 MG tablet    Sig: Take 1 tablet (15 mg total) by mouth every 8 (eight) hours. Must last 30 days.    Dispense:  90 tablet    Refill:  0    DO NOT: delete (not duplicate); no partial-fill (will deny script to complete), no refill request (F/U required). DISPENSE: 1 day early if closed on fill date. WARN: No CNS-depressants within 8 hrs of med.   morphine (MSIR) 15 MG tablet    Sig: Take 1 tablet (15 mg total) by mouth every 8 (eight) hours. Must last 30 days.    Dispense:  90 tablet    Refill:  0    DO NOT: delete (not duplicate); no partial-fill (will deny script to complete), no refill request (F/U required). DISPENSE: 1 day early if closed on fill date. WARN: No CNS-depressants within 8 hrs of med.   Orders:  Orders Placed This Encounter  Procedures   ToxASSURE Select 13 (MW), Urine    Volume: 30 ml(s). Minimum 3 ml of urine is needed. Document temperature of fresh sample. Indications: Long term (current) use of opiate analgesic (Z61.096)  Order Specific Question:   Release to patient    Answer:   Immediate   Follow-up plan:   Return in about 3 months (around 09/13/2021) for Eval-day (M,W), (F2F), (MM).     Interventional Therapies  Risk  Complexity Considerations:   Estimated body mass index is 54.87 kg/m as calculated from the  following:   Height as of this encounter: 5\' 2"  (1.575 m).   Weight as of this encounter: 300 lb (136.1 kg). WNL   Planned  Pending:   (03/02/2021) today we have restarted the patient on the opioid analgesics.  We will be starting her with morphine ER 30 mg, 1 tab p.o. twice daily (60 MME).  We will reevaluate her in 1 month and determine if she is doing well with this new dose after the "Drug Holiday".  Should she feel that this is not enough, we will need to order genetic testing to see if the issue is that she is an ultra-rapid metabolizer.  She has indicated that the oxycodone does not help her pain in any way.   Under consideration:    "Drug Holiday" completed on 02/23/2021.  Total daily dose brought down from 110 MME/day to 60.   Completed:   None   Therapeutic  Palliative (PRN) options:   None     Recent Visits Date Type Provider Dept  03/17/21 Office Visit Delano Metz, MD [facility]  Showing recent visits within past 90 days and meeting all other requirements Today's Visits Date Type Provider Dept  06/15/21 Office Visit Delano Metz, MD [facility]  Showing today's visits and meeting all other requirements Future Appointments No visits were found meeting these conditions. Showing future appointments within next 90 days and meeting all other requirements  I discussed the assessment and treatment plan with the patient. The patient was provided an opportunity to ask questions and all were answered. The patient agreed with the plan and demonstrated an understanding of the instructions.  Patient advised to call back or seek an in-person evaluation if the symptoms or condition worsens.  Duration of encounter: 30 minutes.  Note by: Oswaldo Done, MD Date: 06/15/2021; Time: 1:10 PM

## 2021-06-15 ENCOUNTER — Ambulatory Visit: Payer: 59 | Attending: Pain Medicine | Admitting: Pain Medicine

## 2021-06-15 ENCOUNTER — Encounter: Payer: Self-pay | Admitting: Pain Medicine

## 2021-06-15 VITALS — BP 140/78 | HR 76 | Temp 97.3°F | Resp 18 | Ht 61.0 in | Wt 304.0 lb

## 2021-06-15 DIAGNOSIS — M533 Sacrococcygeal disorders, not elsewhere classified: Secondary | ICD-10-CM | POA: Insufficient documentation

## 2021-06-15 DIAGNOSIS — G894 Chronic pain syndrome: Secondary | ICD-10-CM | POA: Insufficient documentation

## 2021-06-15 DIAGNOSIS — M79604 Pain in right leg: Secondary | ICD-10-CM | POA: Insufficient documentation

## 2021-06-15 DIAGNOSIS — M47816 Spondylosis without myelopathy or radiculopathy, lumbar region: Secondary | ICD-10-CM | POA: Insufficient documentation

## 2021-06-15 DIAGNOSIS — G35 Multiple sclerosis: Secondary | ICD-10-CM | POA: Insufficient documentation

## 2021-06-15 DIAGNOSIS — M961 Postlaminectomy syndrome, not elsewhere classified: Secondary | ICD-10-CM | POA: Diagnosis present

## 2021-06-15 DIAGNOSIS — Z79891 Long term (current) use of opiate analgesic: Secondary | ICD-10-CM | POA: Insufficient documentation

## 2021-06-15 DIAGNOSIS — Z79899 Other long term (current) drug therapy: Secondary | ICD-10-CM | POA: Diagnosis present

## 2021-06-15 DIAGNOSIS — M797 Fibromyalgia: Secondary | ICD-10-CM | POA: Diagnosis present

## 2021-06-15 DIAGNOSIS — M5137 Other intervertebral disc degeneration, lumbosacral region: Secondary | ICD-10-CM | POA: Insufficient documentation

## 2021-06-15 DIAGNOSIS — G8929 Other chronic pain: Secondary | ICD-10-CM | POA: Diagnosis present

## 2021-06-15 DIAGNOSIS — M25552 Pain in left hip: Secondary | ICD-10-CM | POA: Insufficient documentation

## 2021-06-15 DIAGNOSIS — M5441 Lumbago with sciatica, right side: Secondary | ICD-10-CM | POA: Insufficient documentation

## 2021-06-15 DIAGNOSIS — M159 Polyosteoarthritis, unspecified: Secondary | ICD-10-CM | POA: Insufficient documentation

## 2021-06-15 MED ORDER — MORPHINE SULFATE 15 MG PO TABS: 15.0000 mg | ORAL_TABLET | Freq: Three times a day (TID) | ORAL | 0 refills | Status: DC

## 2021-06-15 NOTE — Progress Notes (Signed)
Nursing Pain Medication Assessment:  Safety precautions to be maintained throughout the outpatient stay will include: orient to surroundings, keep bed in low position, maintain call bell within reach at all times, provide assistance with transfer out of bed and ambulation.  Medication Inspection Compliance: Pill count conducted under aseptic conditions, in front of the patient. Neither the pills nor the bottle was removed from the patient's sight at any time. Once count was completed pills were immediately returned to the patient in their original bottle.  Medication: Morphine IR Pill/Patch Count:  5 of 90 pills remain Pill/Patch Appearance: Markings consistent with prescribed medication Bottle Appearance: Standard pharmacy container. Clearly labeled. Filled Date:05  / 08 / 2023 Last Medication intake:  Today

## 2021-06-15 NOTE — Patient Instructions (Signed)
____________________________________________________________________________________________  Medication Rules  Purpose: To inform patients, and their family members, of our rules and regulations.  Applies to: All patients receiving prescriptions (written or electronic).  Pharmacy of record: Pharmacy where electronic prescriptions will be sent. If written prescriptions are taken to a different pharmacy, please inform the nursing staff. The pharmacy listed in the electronic medical record should be the one where you would like electronic prescriptions to be sent.  Electronic prescriptions: In compliance with the [employer] Opioid Misuse Prevention (STOP) Act of 2017 (Session Law 2017-74/H243), effective January 09, 2018, all controlled substances must be electronically prescribed. Calling prescriptions to the pharmacy will cease to exist.  Prescription refills: Only during scheduled appointments. Applies to all prescriptions.  NOTE: The following applies primarily to controlled substances (Opioid* Pain Medications).   Type of encounter (visit): For patients receiving controlled substances, face-to-face visits are required. (Not an option or up to the patient.)  Patient's responsibilities: Pain Pills: Bring all pain pills to every appointment (except for procedure appointments). Pill Bottles: Bring pills in original pharmacy bottle. Always bring the newest bottle. Bring bottle, even if empty. Medication refills: You are responsible for knowing and keeping track of what medications you take and those you need refilled. The day before your appointment: write a list of all prescriptions that need to be refilled. The day of the appointment: give the list to the admitting nurse. Prescriptions will be written only during appointments. No prescriptions will be written on procedure days. If you forget a medication: it will not be "Called in", "Faxed", or "electronically sent". You will  need to get another appointment to get these prescribed. No early refills. Do not call asking to have your prescription filled early. Prescription Accuracy: You are responsible for carefully inspecting your prescriptions before leaving our office. Have the discharge nurse carefully go over each prescription with you, before taking them home. Make sure that your name is accurately spelled, that your address is correct. Check the name and dose of your medication to make sure it is accurate. Check the number of pills, and the written instructions to make sure they are clear and accurate. Make sure that you are given enough medication to last until your next medication refill appointment. Taking Medication: Take medication as prescribed. When it comes to controlled substances, taking less pills or less frequently than prescribed is permitted and encouraged. Never take more pills than instructed. Never take medication more frequently than prescribed.  Inform other Doctors: Always inform, all of your healthcare providers, of all the medications you take. Pain Medication from other Providers: You are not allowed to accept any additional pain medication from any other Doctor or Healthcare provider. There are two exceptions to this rule. (see below) In the event that you require additional pain medication, you are responsible for notifying us, as stated below. Cough Medicine: Often these contain an opioid, such as codeine or hydrocodone. Never accept or take cough medicine containing these opioids if you are already taking an opioid* medication. The combination may cause respiratory failure and death. Medication Agreement: You are responsible for carefully reading and following our Medication Agreement. This must be signed before receiving any prescriptions from our practice. Safely store a copy of your signed Agreement. Violations to the Agreement will result in no further prescriptions. (Additional copies of our  Medication Agreement are available upon request.) Laws, Rules, & Regulations: All patients are expected to follow all Federal and State Laws, Statutes, Rules, & Regulations. Ignorance of   the Laws does not constitute a valid excuse.  Illegal drugs and Controlled Substances: The use of illegal substances (including, but not limited to marijuana and its derivatives) and/or the illegal use of any controlled substances is strictly prohibited. Violation of this rule may result in the immediate and permanent discontinuation of any and all prescriptions being written by our practice. The use of any illegal substances is prohibited. Adopted CDC guidelines & recommendations: Target dosing levels will be at or below 60 MME/day. Use of benzodiazepines** is not recommended.  Exceptions: There are only two exceptions to the rule of not receiving pain medications from other Healthcare Providers. Exception #1 (Emergencies): In the event of an emergency (i.e.: accident requiring emergency care), you are allowed to receive additional pain medication. However, you are responsible for: As soon as you are able, call our office [phone], at any time of the day or night, and leave a message stating your name, the date and nature of the emergency, and the name and dose of the medication prescribed. In the event that your call is answered by a member of our staff, make sure to document and save the date, time, and the name of the person that took your information.  Exception #2 (Planned Surgery): In the event that you are scheduled by another doctor or dentist to have any type of surgery or procedure, you are allowed (for a period no longer than 30 days), to receive additional pain medication, for the acute post-op pain. However, in this case, you are responsible for picking up a copy of our "Post-op Pain Management for Surgeons" handout, and giving it to your surgeon or dentist. This document is available at our office, and  does not require an appointment to obtain it. Simply go to our office during business hours (Monday-Thursday from 8:00 AM to 4:00 PM) (Friday 8:00 AM to 12:00 Noon) or if you have a scheduled appointment with us, prior to your surgery, and ask for it by name. In addition, you are responsible for: calling our office [phone], at any time of the day or night, and leaving a message stating your name, name of your surgeon, type of surgery, and date of procedure or surgery. Failure to comply with your responsibilities may result in termination of therapy involving the controlled substances. Medication Agreement Violation. Following the above rules, including your responsibilities will help you in avoiding a Medication Agreement Violation ("Breaking your Pain Medication Contract").  *Opioid medications include: morphine, codeine, oxycodone, oxymorphone, hydrocodone, hydromorphone, meperidine, tramadol, tapentadol, buprenorphine, fentanyl, methadone. **Benzodiazepine medications include: diazepam (Valium), alprazolam (Xanax), clonazepam (Klonopine), lorazepam (Ativan), clorazepate (Tranxene), chlordiazepoxide (Librium), estazolam (Prosom), oxazepam (Serax), temazepam (Restoril), triazolam (Halcion) (Last updated: 10/06/2020) ____________________________________________________________________________________________  ____________________________________________________________________________________________  Medication Recommendations and Reminders  Applies to: All patients receiving prescriptions (written and/or electronic).  Medication Rules & Regulations: These rules and regulations exist for your safety and that of others. They are not flexible and neither are we. Dismissing or ignoring them will be considered "non-compliance" with medication therapy, resulting in complete and irreversible termination of such therapy. (See document titled "Medication Rules" for more details.) In all conscience,  because of safety reasons, we cannot continue providing a therapy where the patient does not follow instructions.  Pharmacy of record:  Definition: This is the pharmacy where your electronic prescriptions will be sent.  We do not endorse any particular pharmacy, however, we have experienced problems with [facility] not securing enough medication supply for the community. We do not restrict you   in your choice of pharmacy. However, once we write for your prescriptions, we will NOT be re-sending more prescriptions to fix restricted supply problems created by your pharmacy, or your insurance.  The pharmacy listed in the electronic medical record should be the one where you want electronic prescriptions to be sent. If you choose to change pharmacy, simply notify our nursing staff.  Recommendations: Keep all of your pain medications in a safe place, under lock and key, even if you live alone. We will NOT replace lost, stolen, or damaged medication. After you fill your prescription, take 1 week's worth of pills and put them away in a safe place. You should keep a separate, properly labeled bottle for this purpose. The remainder should be kept in the original bottle. Use this as your primary supply, until it runs out. Once it's gone, then you know that you have 1 week's worth of medicine, and it is time to come in for a prescription refill. If you do this correctly, it is unlikely that you will ever run out of medicine. To make sure that the above recommendation works, it is very important that you make sure your medication refill appointments are scheduled at least 1 week before you run out of medicine. To do this in an effective manner, make sure that you do not leave the office without scheduling your next medication management appointment. Always ask the nursing staff to show you in your prescription , when your medication will be running out. Then arrange for the receptionist to get you a return appointment,  at least 7 days before you run out of medicine. Do not wait until you have 1 or 2 pills left, to come in. This is very poor planning and does not take into consideration that we may need to cancel appointments due to bad weather, sickness, or emergencies affecting our staff. DO NOT ACCEPT A "Partial Fill": If for any reason your pharmacy does not have enough pills/tablets to completely fill or refill your prescription, do not allow for a "partial fill". The law allows the pharmacy to complete that prescription within 72 hours, without requiring a new prescription. If they do not fill the rest of your prescription within those 72 hours, you will need a separate prescription to fill the remaining amount, which we will NOT provide. If the reason for the partial fill is your insurance, you will need to talk to the pharmacist about payment alternatives for the remaining tablets, but again, DO NOT ACCEPT A PARTIAL FILL, unless you can trust your pharmacist to obtain the remainder of the pills within 72 hours.  Prescription refills and/or changes in medication(s):  Prescription refills, and/or changes in dose or medication, will be conducted only during scheduled medication management appointments. (Applies to both, written and electronic prescriptions.) No refills on procedure days. No medication will be changed or started on procedure days. No changes, adjustments, and/or refills will be conducted on a procedure day. Doing so will interfere with the diagnostic portion of the procedure. No phone refills. No medications will be "called into the pharmacy". No Fax refills. No weekend refills. No Holliday refills. No after hours refills.  Remember:  Business hours are:  Monday to Thursday 8:00 AM to 4:00 PM Provider's Schedule: Patrizia Paule, MD - Appointments are:  Medication management: Monday and Wednesday 8:00 AM to 4:00 PM Procedure day: Tuesday and Thursday 7:30 AM to 4:00 PM Bilal Lateef, MD -  Appointments are:  Medication management: Tuesday and Thursday 8:00   AM to 4:00 PM Procedure day: Monday and Wednesday 7:30 AM to 4:00 PM (Last update: 07/30/2019) ____________________________________________________________________________________________  ____________________________________________________________________________________________  CBD (cannabidiol) & Delta-8 (Delta-8 tetrahydrocannabinol) WARNING  Intro: Cannabidiol (CBD) and tetrahydrocannabinol (THC), are two natural compounds found in plants of the Cannabis genus. They can both be extracted from hemp or cannabis. Hemp and cannabis come from the Cannabis sativa plant. Both compounds interact with your body's endocannabinoid system, but they have very different effects. CBD does not produce the high sensation associated with cannabis. Delta-8 tetrahydrocannabinol, also known as delta-8 THC, is a psychoactive substance found in the Cannabis sativa plant, of which marijuana and hemp are two varieties. THC is responsible for the high associated with the illicit use of marijuana.  Applicable to: All individuals currently taking or considering taking CBD (cannabidiol) and, more important, all patients taking opioid analgesic controlled substances (pain medication). (Example: oxycodone; oxymorphone; hydrocodone; hydromorphone; morphine; methadone; tramadol; tapentadol; fentanyl; buprenorphine; butorphanol; dextromethorphan; meperidine; codeine; etc.)  Legal status: CBD remains a Schedule I drug prohibited for any use. CBD is illegal with one exception. In the [place], CBD has a limited Food and Drug Administration (FDA) approval for the treatment of two specific types of epilepsy disorders. Only one CBD product has been approved by the FDA for this purpose: "Epidiolex". FDA is aware that some companies are marketing products containing cannabis and cannabis-derived compounds in ways that violate the Federal Food, Drug and Cosmetic Act  (FD&C Act) and that may put the health and safety of consumers at risk. The FDA, a Federal agency, has not enforced the CBD status since 2018. UPDATE: (02/25/2021) The Drug Enforcement Agency (DEA) issued a letter stating that "delta" cannabinoids, including Delta-8-THCO and Delta-9-THCO, synthetically derived from hemp do not qualify as hemp and will be viewed as Schedule I drugs. (Schedule I drugs, substances, or chemicals are defined as drugs with no currently accepted medical use and a high potential for abuse. Some examples of Schedule I drugs are: heroin, lysergic acid diethylamide (LSD), marijuana (cannabis), 3,4-methylenedioxymethamphetamine (ecstasy), methaqualone, and peyote.) (https://www.dea.gov)  Legality: Some manufacturers ship CBD products nationally, which is illegal. Often such products are sold online and are therefore available throughout the country. CBD is openly sold in head shops and health food stores in some states where such sales have not been explicitly legalized. Selling unapproved products with unsubstantiated therapeutic claims is not only a violation of the law, but also can put patients at risk, as these products have not been proven to be safe or effective. Federal illegality makes it difficult to conduct research on CBD.  Reference: "FDA Regulation of Cannabis and Cannabis-Derived Products, Including Cannabidiol (CBD)" - https://www.fda.gov/news-events/public-health-focus/fda-regulation-cannabis-and-cannabis-derived-products-including-cannabidiol-cbd  Warning: CBD is not FDA approved and has not undergo the same manufacturing controls as prescription drugs.  This means that the purity and safety of available CBD may be questionable. Most of the time, despite manufacturer's claims, it is contaminated with THC (delta-9-tetrahydrocannabinol - the chemical in marijuana responsible for the "HIGH").  When this is the case, the THC contaminant will trigger a positive urine drug  screen (UDS) test for Marijuana (carboxy-THC). Because a positive UDS for any illicit substance is a violation of our medication agreement, your opioid analgesics (pain medicine) may be permanently discontinued. The FDA recently put out a warning about 5 things that everyone should be aware of regarding Delta-8 THC: Delta-8 THC products have not been evaluated or approved by the FDA for safe use and may be marketed in ways that put the   public health at risk. The FDA has received adverse event reports involving delta-8 THC-containing products. Delta-8 THC has psychoactive and intoxicating effects. Delta-8 THC manufacturing often involve use of potentially harmful chemicals to create the concentrations of delta-8 THC claimed in the marketplace. The final delta-8 THC product may have potentially harmful by-products (contaminants) due to the chemicals used in the process. Manufacturing of delta-8 THC products may occur in uncontrolled or unsanitary settings, which may lead to the presence of unsafe contaminants or other potentially harmful substances. Delta-8 THC products should be kept out of the reach of children and pets.  MORE ABOUT CBD  General Information: CBD was discovered in 1940 and it is a derivative of the cannabis sativa genus plants (Marijuana and Hemp). It is one of the 113 identified substances found in Marijuana. It accounts for up to 40% of the plant's extract. As of 2018, preliminary clinical studies on CBD included research for the treatment of anxiety, movement disorders, and pain. CBD is available and consumed in multiple forms, including inhalation of smoke or vapor, as an aerosol spray, and by mouth. It may be supplied as an oil containing CBD, capsules, dried cannabis, or as a liquid solution. CBD is thought not to be as psychoactive as THC (delta-9-tetrahydrocannabinol - the chemical in marijuana responsible for the "HIGH"). Studies suggest that CBD may interact with different  biological target receptors in the body, including cannabinoid and other neurotransmitter receptors. As of 2018 the mechanism of action for its biological effects has not been determined.  Side-effects  Adverse reactions: Dry mouth, diarrhea, decreased appetite, fatigue, drowsiness, malaise, weakness, sleep disturbances, and others.  Drug interactions: CBC may interact with other medications such as blood-thinners. Because CBD causes drowsiness on its own, it also increases the drowsiness caused by other medications, including antihistamines (such as Benadryl), benzodiazepines (Xanax, Ativan, Valium), antipsychotics, antidepressants and opioids, as well as alcohol and supplements such as kava, melatonin and St. John's Wort. Be cautious with the following combinations:   Brivaracetam (Briviact) Brivaracetam is changed and broken down by the body. CBD might decrease how quickly the body breaks down brivaracetam. This might increase levels of brivaracetam in the body.  Caffeine Caffeine is changed and broken down by the body. CBD might decrease how quickly the body breaks down caffeine. This might increase levels of caffeine in the body.  Carbamazepine (Tegretol) Carbamazepine is changed and broken down by the body. CBD might decrease how quickly the body breaks down carbamazepine. This might increase levels of carbamazepine in the body and increase its side effects.  Citalopram (Celexa) Citalopram is changed and broken down by the body. CBD might decrease how quickly the body breaks down citalopram. This might increase levels of citalopram in the body and increase its side effects.  Clobazam (Onfi) Clobazam is changed and broken down by the liver. CBD might decrease how quickly the liver breaks down clobazam. This might increase the effects and side effects of clobazam.  Eslicarbazepine (Aptiom) Eslicarbazepine is changed and broken down by the body. CBD might decrease how quickly the body  breaks down eslicarbazepine. This might increase levels of eslicarbazepine in the body by a small amount.  Everolimus (Zostress) Everolimus is changed and broken down by the body. CBD might decrease how quickly the body breaks down everolimus. This might increase levels of everolimus in the body.  Lithium Taking higher doses of CBD might increase levels of lithium. This can increase the risk of lithium toxicity.  Medications changed by the liver (  Cytochrome P450 1A1 (CYP1A1) substrates) Some medications are changed and broken down by the liver. CBD might change how quickly the liver breaks down these medications. This could change the effects and side effects of these medications.  Medications changed by the liver (Cytochrome P450 1A2 (CYP1A2) substrates) Some medications are changed and broken down by the liver. CBD might change how quickly the liver breaks down these medications. This could change the effects and side effects of these medications.  Medications changed by the liver (Cytochrome P450 1B1 (CYP1B1) substrates) Some medications are changed and broken down by the liver. CBD might change how quickly the liver breaks down these medications. This could change the effects and side effects of these medications.  Medications changed by the liver (Cytochrome P450 2A6 (CYP2A6) substrates) Some medications are changed and broken down by the liver. CBD might change how quickly the liver breaks down these medications. This could change the effects and side effects of these medications.  Medications changed by the liver (Cytochrome P450 2B6 (CYP2B6) substrates) Some medications are changed and broken down by the liver. CBD might change how quickly the liver breaks down these medications. This could change the effects and side effects of these medications.  Medications changed by the liver (Cytochrome P450 2C19 (CYP2C19) substrates) Some medications are changed and broken down by the liver.  CBD might change how quickly the liver breaks down these medications. This could change the effects and side effects of these medications.  Medications changed by the liver (Cytochrome P450 2C8 (CYP2C8) substrates) Some medications are changed and broken down by the liver. CBD might change how quickly the liver breaks down these medications. This could change the effects and side effects of these medications.  Medications changed by the liver (Cytochrome P450 2C9 (CYP2C9) substrates) Some medications are changed and broken down by the liver. CBD might change how quickly the liver breaks down these medications. This could change the effects and side effects of these medications.  Medications changed by the liver (Cytochrome P450 2D6 (CYP2D6) substrates) Some medications are changed and broken down by the liver. CBD might change how quickly the liver breaks down these medications. This could change the effects and side effects of these medications.  Medications changed by the liver (Cytochrome P450 2E1 (CYP2E1) substrates) Some medications are changed and broken down by the liver. CBD might change how quickly the liver breaks down these medications. This could change the effects and side effects of these medications.  Medications changed by the liver (Cytochrome P450 3A4 (CYP3A4) substrates) Some medications are changed and broken down by the liver. CBD might change how quickly the liver breaks down these medications. This could change the effects and side effects of these medications.  Medications changed by the liver (Glucuronidated drugs) Some medications are changed and broken down by the liver. CBD might change how quickly the liver breaks down these medications. This could change the effects and side effects of these medications.  Medications that decrease the breakdown of other medications by the liver (Cytochrome P450 2C19 (CYP2C19) inhibitors) CBD is changed and broken down by the liver.  Some drugs decrease how quickly the liver changes and breaks down CBD. This could change the effects and side effects of CBD.  Medications that decrease the breakdown of other medications in the liver (Cytochrome P450 3A4 (CYP3A4) inhibitors) CBD is changed and broken down by the liver. Some drugs decrease how quickly the liver changes and breaks down CBD. This could change the effects   and side effects of CBD.  Medications that increase breakdown of other medications by the liver (Cytochrome P450 3A4 (CYP3A4) inducers) CBD is changed and broken down by the liver. Some drugs increase how quickly the liver changes and breaks down CBD. This could change the effects and side effects of CBD.  Medications that increase the breakdown of other medications by the liver (Cytochrome P450 2C19 (CYP2C19) inducers) CBD is changed and broken down by the liver. Some drugs increase how quickly the liver changes and breaks down CBD. This could change the effects and side effects of CBD.  Methadone (Dolophine) Methadone is broken down by the liver. CBD might decrease how quickly the liver breaks down methadone. Taking cannabidiol along with methadone might increase the effects and side effects of methadone.  Rufinamide (Banzel) Rufinamide is changed and broken down by the body. CBD might decrease how quickly the body breaks down rufinamide. This might increase levels of rufinamide in the body by a small amount.  Sedative medications (CNS depressants) CBD might cause sleepiness and slowed breathing. Some medications, called sedatives, can also cause sleepiness and slowed breathing. Taking CBD with sedative medications might cause breathing problems and/or too much sleepiness.  Sirolimus (Rapamune) Sirolimus is changed and broken down by the body. CBD might decrease how quickly the body breaks down sirolimus. This might increase levels of sirolimus in the body.  Stiripentol (Diacomit) Stiripentol is changed and  broken down by the body. CBD might decrease how quickly the body breaks down stiripentol. This might increase levels of stiripentol in the body and increase its side effects.  Tacrolimus (Prograf) Tacrolimus is changed and broken down by the body. CBD might decrease how quickly the body breaks down tacrolimus. This might increase levels of tacrolimus in the body.  Tamoxifen (Soltamox) Tamoxifen is changed and broken down by the body. CBD might affect how quickly the body breaks down tamoxifen. This might affect levels of tamoxifen in the body.  Topiramate (Topamax) Topiramate is changed and broken down by the body. CBD might decrease how quickly the body breaks down topiramate. This might increase levels of topiramate in the body by a small amount.  Valproate Valproic acid can cause liver injury. Taking cannabidiol with valproic acid might increase the chance of liver injury. CBD and/or valproic acid might need to be stopped, or the dose might need to be reduced.  Warfarin (Coumadin) CBD might increase levels of warfarin, which can increase the risk for bleeding. CBD and/or warfarin might need to be stopped, or the dose might need to be reduced.  Zonisamide Zonisamide is changed and broken down by the body. CBD might decrease how quickly the body breaks down zonisamide. This might increase levels of zonisamide in the body by a small amount. (Last update: 03/09/2021) ____________________________________________________________________________________________  ____________________________________________________________________________________________  Drug Holidays (Slow)  What is a "Drug Holiday"? Drug Holiday: is the name given to the period of time during which a patient stops taking a medication(s) for the purpose of eliminating tolerance to the drug.  Benefits Improved effectiveness of opioids. Decreased opioid dose needed to achieve benefits. Improved pain with lesser  dose.  What is tolerance? Tolerance: is the progressive decreased in effectiveness of a drug due to its repetitive use. With repetitive use, the body gets use to the medication and as a consequence, it loses its effectiveness. This is a common problem seen with opioid pain medications. As a result, a larger dose of the drug is needed to achieve the same effect that   used to be obtained with a smaller dose.  How long should a "Drug Holiday" last? You should stay off of the pain medicine for at least 14 consecutive days. (2 weeks)  Should I stop the medicine "cold turkey"? No. You should always coordinate with your Pain Specialist so that he/she can provide you with the correct medication dose to make the transition as smoothly as possible.  How do I stop the medicine? Slowly. You will be instructed to decrease the daily amount of pills that you take by one (1) pill every seven (7) days. This is called a "slow downward taper" of your dose. For example: if you normally take four (4) pills per day, you will be asked to drop this dose to three (3) pills per day for seven (7) days, then to two (2) pills per day for seven (7) days, then to one (1) per day for seven (7) days, and at the end of those last seven (7) days, this is when the "Drug Holiday" would start.   Will I have withdrawals? By doing a "slow downward taper" like this one, it is unlikely that you will experience any significant withdrawal symptoms. Typically, what triggers withdrawals is the sudden stop of a high dose opioid therapy. Withdrawals can usually be avoided by slowly decreasing the dose over a prolonged period of time. If you do not follow these instructions and decide to stop your medication abruptly, withdrawals may be possible.  What are withdrawals? Withdrawals: refers to the wide range of symptoms that occur after stopping or dramatically reducing opiate drugs after heavy and prolonged use. Withdrawal symptoms do not occur to  patients that use low dose opioids, or those who take the medication sporadically. Contrary to benzodiazepine (example: Valium, Xanax, etc.) or alcohol withdrawals ("Delirium Tremens"), opioid withdrawals are not lethal. Withdrawals are the physical manifestation of the body getting rid of the excess receptors.  Expected Symptoms Early symptoms of withdrawal may include: Agitation Anxiety Muscle aches Increased tearing Insomnia Runny nose Sweating Yawning  Late symptoms of withdrawal may include: Abdominal cramping Diarrhea Dilated pupils Goose bumps Nausea Vomiting  Will I experience withdrawals? Due to the slow nature of the taper, it is very unlikely that you will experience any.  What is a slow taper? Taper: refers to the gradual decrease in dose.  (Last update: 07/30/2019) ____________________________________________________________________________________________    

## 2021-06-23 LAB — TOXASSURE SELECT 13 (MW), URINE

## 2021-09-04 NOTE — Progress Notes (Unsigned)
PROVIDER NOTE: Information contained herein reflects review and annotations entered in association with encounter. Interpretation of such information and data should be left to medically-trained personnel. Information provided to patient can be located elsewhere in the medical record under "Patient Instructions". Document created using STT-dictation technology, any transcriptional errors that may result from process are unintentional.    Patient: Tiffany Herring  Service Category: E/M  Provider: Gaspar Cola, MD  DOB: [birth year]  DOS: 09/07/2021  Referring Provider: Hortencia Pilar, MD  MRN: [id number]  Specialty: Interventional Pain Management  PCP: Hortencia Pilar, MD  Type: Established Patient  Setting: Ambulatory outpatient    Location: Office  Delivery: Face-to-face     HPI  Tiffany Herring, a 61 y.o. year old female, is here today because of her No primary diagnosis found.. Tiffany Herring primary complain today is No chief complaint on file. Last encounter: My last encounter with her was on 06/15/2021. Pertinent problems: Tiffany Herring has Failed back surgical syndrome; Epidural fibrosis; Chronic low back pain (Bilateral) w/ sciatica (Right); Chronic lumbar radicular pain (Right); Generalized OA; Multiple sclerosis ([place]); Degenerative arthritis of hip; Fibromyalgia; Lumbar spondylosis; Grade 1 Anterolisthesis of L4/L5; Chronic sacroiliac joint pain (bilateral); Chronic hip pain (Left); Arthropathy of left hip (Severe chronic left hip DJD); Osteoarthritis of hip (Left); Lumbar facet arthropathy ([place]); Lumbar facet hypertrophy; Lumbar facet syndrome (Bilateral); Chronic flank pain (Right); Chronic lower extremity pain (Right); Chronic pain syndrome; Venous stasis ulcer ([place]); OA (osteoarthritis); Osteoarthrosis, hip; Generalized osteoarthritis of multiple sites; and DDD (degenerative disc disease), lumbosacral on their pertinent problem list. Pain Assessment: Severity of   is reported as a  /10.  Location:    / . Onset:  . Quality:  . Timing:  . Modifying factor(s):  Marland Kitchen Vitals:  vitals were not taken for this visit.   Reason for encounter:  *** . ***  Pharmacotherapy Assessment  Analgesic: MSIR 15 mg, 1 tab PO TID. "Drug Holiday" completed on 02/23/2021.  Martin Majestic from MME of 110 mg/day to 45 mg/day). MME/day: 45 mg/day   Monitoring: [place] PMP: PDMP reviewed during this encounter.       Pharmacotherapy: No side-effects or adverse reactions reported. Compliance: No problems identified. Effectiveness: Clinically acceptable.  No notes on file  No results found for: "CBDTHCR" No results found for: "D8THCCBX" No results found for: "D9THCCBX"  UDS:  Summary  Date Value Ref Range Status  06/15/2021 Note  Final    Comment:    ==================================================================== [employer] [facility] 13 (MW) ==================================================================== Test                             Result       Flag       Units  Drug Present and Declared for Prescription Verification   Morphine                       11584        EXPECTED   ng/mg creat   Normorphine                    189          EXPECTED   ng/mg creat    Potential sources of large amounts of morphine in the absence of    codeine include administration of morphine or use of heroin.     Normorphine is an expected metabolite of morphine.    Hydromorphone  90           EXPECTED   ng/mg creat    Hydromorphone may be present as a metabolite of morphine;    concentrations of hydromorphone rarely exceed 5% of the morphine    concentration when this is the source of hydromorphone.  Drug Absent but Declared for Prescription Verification   Oxycodone                      Not Detected UNEXPECTED ng/mg creat ==================================================================== Test                      Result    Flag   Units      Ref Range   Creatinine              79               mg/dL       >=20 ==================================================================== Declared Medications:  The flagging and interpretation on this report are based on the  following declared medications.  Unexpected results may arise from  inaccuracies in the declared medications.   **Note: The testing scope of this panel includes these medications:   Morphine (MSIR)  Oxycodone   **Note: The testing scope of this panel does not include the  following reported medications:   Albuterol (Ventolin HFA)  Aspirin  Bupropion (Wellbutrin)  Clotrimazole (Mycelex)  Cyanocobalamin  Enalapril (Vasotec)  Furosemide (Lasix)  Gabapentin (Neurontin)  Insulin (Novolin)  Mirtazapine (Remeron)  Montelukast (Singulair)  Naloxone (Narcan)  Oxybutynin (Ditropan)  Timolol (Timoptic)  Tizanidine (Zanaflex) ==================================================================== For clinical consultation, please call [phone]. ====================================================================       ROS  Constitutional: Denies any fever or chills Gastrointestinal: No reported hemesis, hematochezia, vomiting, or acute GI distress Musculoskeletal: Denies any acute onset joint swelling, redness, loss of ROM, or weakness Neurological: No reported episodes of acute onset apraxia, aphasia, dysarthria, agnosia, amnesia, paralysis, loss of coordination, or loss of consciousness  Medication Review  BLOOD GLUCOSE TEST STRIPS, Fifty50 Glucose Meter 2.0, Insulin Syringe-Needle U-100, accu-chek multiclix, albuterol, aspirin, buPROPion, clotrimazole, cyanocobalamin, enalapril, furosemide, gabapentin, glucose blood, insulin regular, mirtazapine, montelukast, morphine, naloxone, oxyCODONE, oxybutynin, tiZANidine, and timolol  History Review  Allergy: Tiffany Herring is allergic to amoxicillin-pot clavulanate, copaxone  [glatiramer acetate], pseudoephedrine, rofecoxib, sulfa antibiotics, sulfamethoxazole-trimethoprim,  and meloxicam. Drug: Tiffany Herring  reports no history of drug use. Alcohol:  reports no history of alcohol use. Tobacco:  reports that she has quit smoking. She has never used smokeless tobacco. Social: Tiffany Herring  reports that she has quit smoking. She has never used smokeless tobacco. She reports that she does not drink alcohol and does not use drugs. Medical:  has a past medical history of Allergy, Arthritis, Asthma, Chronic kidney disease, Depression, Diabetes mellitus without complication ([place]), Hypertension, Multiple sclerosis ([place]), Neuromuscular disorder ([place]), and Sleep apnea. Surgical: Tiffany Herring  has a past surgical history that includes Cesarean section (1991 and 1996). Family: family history includes Dementia in her mother; Depression in her mother; Diabetes in her mother; Hyperlipidemia in her mother; Stroke in her father.  Laboratory Chemistry Profile   Renal Lab Results  Component Value Date   BUN 18 02/06/2018   CREATININE 1.22 (H) 02/06/2018   BCR 15 02/06/2018   GFRAA 56 (L) 02/06/2018   GFRNONAA 49 (L) 02/06/2018    Hepatic Lab Results  Component Value Date   AST 25 02/06/2018   ALT 19 02/15/2015   ALBUMIN 4.6  02/06/2018   ALKPHOS 97 02/06/2018    Electrolytes Lab Results  Component Value Date   NA 136 02/06/2018   K 4.9 02/06/2018   CL 95 (L) 02/06/2018   CALCIUM 9.8 02/06/2018   MG 1.8 02/06/2018    Bone Lab Results  Component Value Date   25OHVITD1 55 02/06/2018   25OHVITD2 2.9 02/06/2018   25OHVITD3 52 02/06/2018    Inflammation (CRP: Acute Phase) (ESR: Chronic Phase) Lab Results  Component Value Date   CRP 17 (H) 02/06/2018   ESRSEDRATE 28 02/06/2018         Note: Above Lab results reviewed.  Recent Imaging Review  US Abdomen Complete **** PRIOR REPORT IMPORTED FROM AN EXTERNAL SYSTEM ****   PRIOR REPORT IMPORTED FROM THE SYNGO WORKFLOW SYSTEM   REASON FOR EXAM:    Enlarged Liver Spleen  COMMENTS:   PROCEDURE:     [place]  - US  ABDOMEN GENERAL SURVEY  - Jul 29 2012 10:20AM   RESULT:     The liver is enlarged. It demonstrates increased echo texture  consistent with fatty infiltration. Portal venous flow is normal in  direction toward the liver. There is splenomegaly with maximal dimension  of  the spleen of 16.2 cm.   The gallbladder is adequately distended with no evidence of stones, wall  thickening, or pericholecystic fluid. There is no positive sonographic  Murphy's sign. The common bile duct measures 5.5 mm in diameter.   Evaluation the pancreas is limited by bowel gas. Similarly the abdominal  aorta is only partially visualized. The inferior vena cava and kidneys are  normal in appearance.   IMPRESSION:  1. There is hepatosplenomegaly.  2. The gallbladder and common bile duct and observed portions of the  pancreas appear normal.  3. The kidneys exhibit no evidence of obstruction nor other acute  abnormality.   Dictation Site: 2    Note: Reviewed        Physical Exam  General appearance: Well nourished, well developed, and well hydrated. In no apparent acute distress Mental status: Alert, oriented x 3 (person, place, & time)       Respiratory: No evidence of acute respiratory distress Eyes: PERLA Vitals: There were no vitals taken for this visit. BMI: Estimated body mass index is 57.44 kg/m as calculated from the following:   Height as of 06/15/21: 5' 1"  (1.549 m).   Weight as of 06/15/21: 304 lb (137.9 kg). Ideal: Patient weight not recorded  Assessment   Diagnosis Status  No diagnosis found. Controlled Controlled Controlled   Updated Problems: No problems updated.  Plan of Care  Problem-specific:  No problem-specific Assessment & Plan notes found for this encounter.  Tiffany Herring has a current medication list which includes the following long-term medication(s): albuterol, bupropion, furosemide, gabapentin, insulin regular, mirtazapine, montelukast, morphine, morphine,  morphine, and [DISCONTINUED] oxycodone.  Pharmacotherapy (Medications Ordered): No orders of the defined types were placed in this encounter.  Orders:  No orders of the defined types were placed in this encounter.  Follow-up plan:   No follow-ups on file.     Interventional Therapies  Risk  Complexity Considerations:   Estimated body mass index is 54.87 kg/m as calculated from the following:   Height as of this encounter: 5' 2"  (1.575 m).   Weight as of this encounter: 300 lb (136.1 kg). WNL   Planned  Pending:   (03/02/2021) today we have restarted the patient on the opioid analgesics.  We will be  starting her with morphine ER 30 mg, 1 tab p.o. twice daily (60 MME).  We will reevaluate her in 1 month and determine if she is doing well with this new dose after the "Drug Holiday".  Should she feel that this is not enough, we will need to order genetic testing to see if the issue is that she is an ultra-rapid metabolizer.  She has indicated that the oxycodone does not help her pain in any way.   Under consideration:    "Drug Holiday" completed on 02/23/2021.  Total daily dose brought down from 110 MME/day to 60.   Completed:   None   Therapeutic  Palliative (PRN) options:   None      Recent Visits Date Type Provider Dept  06/15/21 Office Visit Milinda Pointer, MD [facility]  Showing recent visits within past 90 days and meeting all other requirements Future Appointments Date Type Provider Dept  09/07/21 Appointment Milinda Pointer, MD [facility]  Showing future appointments within next 90 days and meeting all other requirements  I discussed the assessment and treatment plan with the patient. The patient was provided an opportunity to ask questions and all were answered. The patient agreed with the plan and demonstrated an understanding of the instructions.  Patient advised to call back or seek an in-person evaluation if the symptoms or  condition worsens.  Duration of encounter: *** minutes.  Total time on encounter, as per AMA guidelines included both the face-to-face and non-face-to-face time personally spent by the physician and/or other qualified health care professional(s) on the day of the encounter (includes time in activities that require the physician or other qualified health care professional and does not include time in activities normally performed by clinical staff). Physician's time may include the following activities when performed: preparing to see the patient (eg, review of tests, pre-charting review of records) obtaining and/or reviewing separately obtained history performing a medically appropriate examination and/or evaluation counseling and educating the patient/family/caregiver ordering medications, tests, or procedures referring and communicating with other health care professionals (when not separately reported) documenting clinical information in the electronic or other health record independently interpreting results (not separately reported) and communicating results to the patient/ family/caregiver care coordination (not separately reported)  Note by: Gaspar Cola, MD Date: 09/07/2021; Time: 9:15 AM

## 2021-09-07 ENCOUNTER — Encounter: Payer: Self-pay | Admitting: Pain Medicine

## 2021-09-07 ENCOUNTER — Ambulatory Visit: Payer: 59 | Attending: Pain Medicine | Admitting: Pain Medicine

## 2021-09-07 VITALS — BP 129/70 | HR 71 | Temp 97.9°F | Resp 16 | Ht 62.0 in | Wt 302.0 lb

## 2021-09-07 DIAGNOSIS — M5137 Other intervertebral disc degeneration, lumbosacral region: Secondary | ICD-10-CM | POA: Diagnosis present

## 2021-09-07 DIAGNOSIS — M5441 Lumbago with sciatica, right side: Secondary | ICD-10-CM | POA: Insufficient documentation

## 2021-09-07 DIAGNOSIS — M797 Fibromyalgia: Secondary | ICD-10-CM | POA: Insufficient documentation

## 2021-09-07 DIAGNOSIS — M25552 Pain in left hip: Secondary | ICD-10-CM | POA: Insufficient documentation

## 2021-09-07 DIAGNOSIS — M79604 Pain in right leg: Secondary | ICD-10-CM | POA: Diagnosis present

## 2021-09-07 DIAGNOSIS — M159 Polyosteoarthritis, unspecified: Secondary | ICD-10-CM | POA: Insufficient documentation

## 2021-09-07 DIAGNOSIS — G894 Chronic pain syndrome: Secondary | ICD-10-CM | POA: Diagnosis present

## 2021-09-07 DIAGNOSIS — G8929 Other chronic pain: Secondary | ICD-10-CM | POA: Insufficient documentation

## 2021-09-07 DIAGNOSIS — M961 Postlaminectomy syndrome, not elsewhere classified: Secondary | ICD-10-CM | POA: Diagnosis present

## 2021-09-07 DIAGNOSIS — M533 Sacrococcygeal disorders, not elsewhere classified: Secondary | ICD-10-CM | POA: Diagnosis present

## 2021-09-07 DIAGNOSIS — G35 Multiple sclerosis: Secondary | ICD-10-CM | POA: Insufficient documentation

## 2021-09-07 DIAGNOSIS — M47816 Spondylosis without myelopathy or radiculopathy, lumbar region: Secondary | ICD-10-CM | POA: Insufficient documentation

## 2021-09-07 DIAGNOSIS — Z79891 Long term (current) use of opiate analgesic: Secondary | ICD-10-CM | POA: Diagnosis present

## 2021-09-07 DIAGNOSIS — Z79899 Other long term (current) drug therapy: Secondary | ICD-10-CM | POA: Diagnosis present

## 2021-09-07 MED ORDER — MORPHINE SULFATE 15 MG PO TABS: 15.0000 mg | ORAL_TABLET | Freq: Three times a day (TID) | ORAL | 0 refills | Status: DC

## 2021-09-07 NOTE — Patient Instructions (Signed)

## 2021-09-07 NOTE — Progress Notes (Signed)
Nursing Pain Medication Assessment:  Safety precautions to be maintained throughout the outpatient stay will include: orient to surroundings, keep bed in low position, maintain call bell within reach at all times, provide assistance with transfer out of bed and ambulation.  Medication Inspection Compliance: Pill count conducted under aseptic conditions, in front of the patient. Neither the pills nor the bottle was removed from the patient's sight at any time. Once count was completed pills were immediately returned to the patient in their original bottle.  Medication: Morphine IR Pill/Patch Count:  20 of 90 pills remain Pill/Patch Appearance: Markings consistent with prescribed medication Bottle Appearance: Standard pharmacy container. Clearly labeled. Filled Date: 08 / 05 / 2023 Last Medication intake:  Today

## 2021-11-02 DIAGNOSIS — E11621 Type 2 diabetes mellitus with foot ulcer: Secondary | ICD-10-CM | POA: Insufficient documentation

## 2021-11-22 ENCOUNTER — Other Ambulatory Visit: Payer: Self-pay | Admitting: Nephrology

## 2021-11-22 DIAGNOSIS — E1122 Type 2 diabetes mellitus with diabetic chronic kidney disease: Secondary | ICD-10-CM

## 2021-11-22 DIAGNOSIS — N1832 Chronic kidney disease, stage 3b: Secondary | ICD-10-CM

## 2021-11-25 ENCOUNTER — Ambulatory Visit
Admission: RE | Admit: 2021-11-25 | Discharge: 2021-11-25 | Disposition: A | Discharge status: Home / Self Care | Payer: 59 | Source: Ambulatory Visit | Attending: Nephrology | Admitting: Nephrology

## 2021-11-25 DIAGNOSIS — E1122 Type 2 diabetes mellitus with diabetic chronic kidney disease: Secondary | ICD-10-CM | POA: Insufficient documentation

## 2021-11-25 DIAGNOSIS — N1832 Chronic kidney disease, stage 3b: Secondary | ICD-10-CM | POA: Insufficient documentation

## 2021-12-03 NOTE — Progress Notes (Signed)
PROVIDER NOTE: Information contained herein reflects review and annotations entered in association with encounter. Interpretation of such information and data should be left to medically-trained personnel. Information provided to patient can be located elsewhere in the medical record under "Patient Instructions". Document created using STT-dictation technology, any transcriptional errors that may result from process are unintentional.    Patient: Tiffany Herring  Service Category: E/M  Provider: Francisco A Naveira, MD  DOB: 07/26/1960  DOS: 12/05/2021  Referring Provider: Grandis, Heidi, MD  MRN: [id number]  Specialty: Interventional Pain Management  PCP: Grandis, Heidi, MD  Type: Established Patient  Setting: Ambulatory outpatient    Location: Office  Delivery: Face-to-face     HPI  [name], a 61 y.o. year old female, is here today because of her Chronic pain syndrome [G89.4]. [name]'s primary complain today is Back Pain Last encounter: My last encounter with her was on 09/07/2021. Pertinent problems: [name] has Failed back surgical syndrome; Epidural fibrosis; Chronic low back pain (Bilateral) w/ sciatica (Right); Chronic lumbar radicular pain (Right); Generalized OA; Multiple sclerosis (HCC); Degenerative arthritis of hip; Fibromyalgia; Lumbar spondylosis; Grade 1 Anterolisthesis of L4/L5; Chronic sacroiliac joint pain (bilateral); Chronic hip pain (Left); Arthropathy of left hip (Severe chronic left hip DJD); Osteoarthritis of hip (Left); Lumbar facet arthropathy (HCC); Lumbar facet hypertrophy; Lumbar facet syndrome (Bilateral); Chronic flank pain (Right); Chronic lower extremity pain (Right); Chronic pain syndrome; Venous stasis ulcer (HCC); OA (osteoarthritis); Osteoarthrosis, hip; Generalized osteoarthritis of multiple sites; and DDD (degenerative disc disease), lumbosacral on their pertinent problem list. Pain Assessment: Severity of Chronic pain is reported as a 5 /10.  Location: Back Left/pain nradiaities down her left leg to knee. Onset: More than a month ago. Quality: Aching, Burning, Constant, Radiating, Stabbing. Timing: Constant. Modifying factor(s):  . Vitals:  height is 5' 2" (1.575 m) and weight is 302 lb (137 kg) (abnormal). Her temperature is 97.9 F (36.6 C). Her blood pressure is 146/99 (abnormal) and her pulse is 88. Her oxygen saturation is 99%.   Reason for encounter: medication management.  The patient indicates doing well with the current medication regimen. No adverse reactions or side effects reported to the medications.   RTCB: 03/12/2022    Pharmacotherapy Assessment  Analgesic: MSIR 15 mg, 1 tab PO TID. "Drug Holiday" completed on 02/23/2021.  (Went from MME of 110 mg/day to 45 mg/day). MME/day: 45 mg/day   Monitoring: [place] PMP: PDMP reviewed during this encounter.       Pharmacotherapy: No side-effects or adverse reactions reported. Compliance: No problems identified. Effectiveness: Clinically acceptable.  Brown, Sheila A, RN  12/05/2021  2:25 PM  Sign when Signing Visit Nursing Pain Medication Assessment:  Safety precautions to be maintained throughout the outpatient stay will include: orient to surroundings, keep bed in low position, maintain call bell within reach at all times, provide assistance with transfer out of bed and ambulation.  Medication Inspection Compliance: Pill count conducted under aseptic conditions, in front of the patient. Neither the pills nor the bottle was removed from the patient's sight at any time. Once count was completed pills were immediately returned to the patient in their original bottle.  Medication: Morphine IR Pill/Patch Count:  23 of 90 pills remain Pill/Patch Appearance: Markings consistent with prescribed medication Bottle Appearance: Standard pharmacy container. Clearly labeled. Filled Date: 11 / 4 / 2023 Last Medication intake:  TodaySafety precautions to be maintained throughout the  outpatient stay will include: orient to surroundings, keep bed in low position,   maintain call bell within reach at all times, provide assistance with transfer out of bed and ambulation.     No results found for: "CBDTHCR" No results found for: "D8THCCBX" No results found for: "D9THCCBX"  UDS:  Summary  Date Value Ref Range Status  06/15/2021 Note  Final    Comment:    ==================================================================== [employer] Select 13 (MW) ==================================================================== Test                             Result       Flag       Units  Drug Present and Declared for Prescription Verification   Morphine                       11584        EXPECTED   ng/mg creat   Normorphine                    189          EXPECTED   ng/mg creat    Potential sources of large amounts of morphine in the absence of    codeine include administration of morphine or use of heroin.     Normorphine is an expected metabolite of morphine.    Hydromorphone                  90           EXPECTED   ng/mg creat    Hydromorphone may be present as a metabolite of morphine;    concentrations of hydromorphone rarely exceed 5% of the morphine    concentration when this is the source of hydromorphone.  Drug Absent but Declared for Prescription Verification   Oxycodone                      Not Detected UNEXPECTED ng/mg creat ==================================================================== Test                      Result    Flag   Units      Ref Range   Creatinine              79               mg/dL      >=20 ==================================================================== Declared Medications:  The flagging and interpretation on this report are based on the  following declared medications.  Unexpected results may arise from  inaccuracies in the declared medications.   **Note: The testing scope of this panel includes these medications:   Morphine  (MSIR)  Oxycodone   **Note: The testing scope of this panel does not include the  following reported medications:   Albuterol (Ventolin HFA)  Aspirin  Bupropion (Wellbutrin)  Clotrimazole (Mycelex)  Cyanocobalamin  Enalapril (Vasotec)  Furosemide (Lasix)  Gabapentin (Neurontin)  Insulin (Novolin)  Mirtazapine (Remeron)  Montelukast (Singulair)  Naloxone (Narcan)  Oxybutynin (Ditropan)  Timolol (Timoptic)  Tizanidine (Zanaflex) ==================================================================== For clinical consultation, please call [phone]. ====================================================================       ROS  Constitutional: Denies any fever or chills Gastrointestinal: No reported hemesis, hematochezia, vomiting, or acute GI distress Musculoskeletal: Denies any acute onset joint swelling, redness, loss of ROM, or weakness Neurological: No reported episodes of acute onset apraxia, aphasia, dysarthria, agnosia, amnesia, paralysis, loss of coordination, or loss of consciousness  Medication Review  BLOOD GLUCOSE TEST STRIPS, Cholecalciferol,  Fifty50 Glucose Meter 2.0, Insulin Syringe-Needle U-100, accu-chek multiclix, albuterol, aspirin, buPROPion, clotrimazole, cyanocobalamin, enalapril, furosemide, gabapentin, glucose blood, insulin regular, mirtazapine, montelukast, morphine, naloxone, oxybutynin, tiZANidine, and timolol  History Review  Allergy: [name] is allergic to amoxicillin-pot clavulanate, copaxone  [glatiramer acetate], pseudoephedrine, rofecoxib, sulfa antibiotics, and meloxicam. Drug: [name]  reports no history of drug use. Alcohol:  reports no history of alcohol use. Tobacco:  reports that she has quit smoking. She has never used smokeless tobacco. Social: [name]  reports that she has quit smoking. She has never used smokeless tobacco. She reports that she does not drink alcohol and does not use drugs. Medical:  has a past  medical history of Allergy, Arthritis, Asthma, Chronic kidney disease, Depression, Diabetes mellitus without complication (HCC), Hypertension, Multiple sclerosis (HCC), Neuromuscular disorder (HCC), and Sleep apnea. Surgical: [name]  has a past surgical history that includes Cesarean section (1991 and 1996). Family: family history includes Dementia in her mother; Depression in her mother; Diabetes in her mother; Hyperlipidemia in her mother; Stroke in her father.  Laboratory Chemistry Profile   Renal Lab Results  Component Value Date   BUN 18 02/06/2018   CREATININE 1.22 (H) 02/06/2018   BCR 15 02/06/2018   GFRAA 56 (L) 02/06/2018   GFRNONAA 49 (L) 02/06/2018    Hepatic Lab Results  Component Value Date   AST 25 02/06/2018   ALT 19 02/15/2015   ALBUMIN 4.6 02/06/2018   ALKPHOS 97 02/06/2018    Electrolytes Lab Results  Component Value Date   NA 136 02/06/2018   K 4.9 02/06/2018   CL 95 (L) 02/06/2018   CALCIUM 9.8 02/06/2018   MG 1.8 02/06/2018    Bone Lab Results  Component Value Date   25OHVITD1 55 02/06/2018   25OHVITD2 2.9 02/06/2018   25OHVITD3 52 02/06/2018    Inflammation (CRP: Acute Phase) (ESR: Chronic Phase) Lab Results  Component Value Date   CRP 17 (H) 02/06/2018   ESRSEDRATE 28 02/06/2018         Note: Above Lab results reviewed.  Recent Imaging Review  US RENAL CLINICAL DATA:  Chronic renal disease  EXAM: RENAL / URINARY TRACT ULTRASOUND COMPLETE  COMPARISON:  None Available.  FINDINGS: Right Kidney:  Renal measurements: 10.3 x 4.9 x 5.4 cm = volume: 143 mL. Diffuse increased echogenicity.  Left Kidney:  Renal measurements: 9.9 x 4.6 x 5.2 cm = volume: 125 mL. Diffuse increased echogenicity.  Bladder:  Appears normal for degree of bladder distention.  Other:  None.  IMPRESSION: Diffuse increased echogenicity in both kidneys is consistent with medical renal disease. No other abnormalities.  Electronically Signed   By:  David  Williams III M.D.   On: 11/25/2021 14:49 Note: Reviewed        Physical Exam  General appearance: Well nourished, well developed, and well hydrated. In no apparent acute distress Mental status: Alert, oriented x 3 (person, place, & time)       Respiratory: No evidence of acute respiratory distress Eyes: PERLA Vitals: BP (!) 146/99   Pulse 88   Temp 97.9 F (36.6 C)   Ht 5' 2" (1.575 m)   Wt (!) 302 lb (137 kg)   SpO2 99%   BMI 55.24 kg/m  BMI: Estimated body mass index is 55.24 kg/m as calculated from the following:   Height as of this encounter: 5' 2" (1.575 m).   Weight as of this encounter: 302 lb (137 kg). Ideal: Ideal body weight: 50.1 kg (  110 lb 7.2 oz) Adjusted ideal body weight: 84.9 kg (187 lb 1.1 oz)  Assessment   Diagnosis Status  1. Chronic pain syndrome   2. Multiple sclerosis (HCC)   3. Generalized osteoarthritis of multiple sites   4. Fibromyalgia   5. DDD (degenerative disc disease), lumbosacral   6. Chronic sacroiliac joint pain (bilateral)   7. Lumbar facet syndrome (Bilateral)   8. Chronic hip pain (Left)   9. Failed back surgical syndrome   10. Chronic lower extremity pain (Right)   11. Chronic low back pain (Bilateral) w/ sciatica (Right)   12. Pharmacologic therapy   13. Chronic use of opiate for therapeutic purpose   14. Encounter for medication management   15. Encounter for chronic pain management    Controlled Controlled Controlled   Updated Problems: No problems updated.  Plan of Care  Problem-specific:  No problem-specific Assessment & Plan notes found for this encounter.  [name] has a current medication list which includes the following long-term medication(s): albuterol, bupropion, furosemide, gabapentin, insulin regular, mirtazapine, montelukast, [START ON 12/12/2021] morphine, [START ON 01/11/2022] morphine, and [START ON 02/10/2022] morphine.  Pharmacotherapy (Medications Ordered): Meds ordered this encounter   Medications   morphine (MSIR) 15 MG tablet    Sig: Take 1 tablet (15 mg total) by mouth every 8 (eight) hours. Must last 30 days.    Dispense:  90 tablet    Refill:  0    DO NOT: delete (not duplicate); no partial-fill (will deny script to complete), no refill request (F/U required). DISPENSE: 1 day early if closed on fill date. WARN: No CNS-depressants within 8 hrs of med.   morphine (MSIR) 15 MG tablet    Sig: Take 1 tablet (15 mg total) by mouth every 8 (eight) hours. Must last 30 days.    Dispense:  90 tablet    Refill:  0    DO NOT: delete (not duplicate); no partial-fill (will deny script to complete), no refill request (F/U required). DISPENSE: 1 day early if closed on fill date. WARN: No CNS-depressants within 8 hrs of med.   morphine (MSIR) 15 MG tablet    Sig: Take 1 tablet (15 mg total) by mouth every 8 (eight) hours. Must last 30 days.    Dispense:  90 tablet    Refill:  0    DO NOT: delete (not duplicate); no partial-fill (will deny script to complete), no refill request (F/U required). DISPENSE: 1 day early if closed on fill date. WARN: No CNS-depressants within 8 hrs of med.   Orders:  No orders of the defined types were placed in this encounter.  Follow-up plan:   Return in about 3 months (around 03/12/2022) for Eval-day (M,W), (F2F), (MM).     Interventional Therapies  Risk  Complexity Considerations:   Estimated body mass index is 54.87 kg/m as calculated from the following:   Height as of this encounter: 5' 2" (1.575 m).   Weight as of this encounter: 300 lb (136.1 kg). WNL   Planned  Pending:   (03/02/2021) today we have restarted the patient on the opioid analgesics.  We will be starting her with morphine ER 30 mg, 1 tab p.o. twice daily (60 MME).  We will reevaluate her in 1 month and determine if she is doing well with this new dose after the "Drug Holiday".  Should she feel that this is not enough, we will need to order genetic testing to see if the issue is    that she is an ultra-rapid metabolizer.  She has indicated that the oxycodone does not help her pain in any way.   Under consideration:    "Drug Holiday" completed on 02/23/2021.  Total daily dose brought down from 110 MME/day to 60.   Completed:   None   Therapeutic  Palliative (PRN) options:   None   Pharmacological Recommendations:   "Drug Holiday" completed on 02/23/2021.     Recent Visits Date Type Provider Dept  09/07/21 Office Visit Naveira, Francisco, MD [facility]  Showing recent visits within past 90 days and meeting all other requirements Today's Visits Date Type Provider Dept  12/05/21 Office Visit Naveira, Francisco, MD [facility]  Showing today's visits and meeting all other requirements Future Appointments No visits were found meeting these conditions. Showing future appointments within next 90 days and meeting all other requirements  I discussed the assessment and treatment plan with the patient. The patient was provided an opportunity to ask questions and all were answered. The patient agreed with the plan and demonstrated an understanding of the instructions.  Patient advised to call back or seek an in-person evaluation if the symptoms or condition worsens.  Duration of encounter: 30 minutes.  Total time on encounter, as per AMA guidelines included both the face-to-face and non-face-to-face time personally spent by the physician and/or other qualified health care professional(s) on the day of the encounter (includes time in activities that require the physician or other qualified health care professional and does not include time in activities normally performed by clinical staff). Physician's time may include the following activities when performed: preparing to see the patient (eg, review of tests, pre-charting review of records) obtaining and/or reviewing separately obtained history performing a medically appropriate examination and/or  evaluation counseling and educating the patient/family/caregiver ordering medications, tests, or procedures referring and communicating with other health care professionals (when not separately reported) documenting clinical information in the electronic or other health record independently interpreting results (not separately reported) and communicating results to the patient/ family/caregiver care coordination (not separately reported)  Note by: Francisco A Naveira, MD Date: 12/05/2021; Time: 2:34 PM 

## 2021-12-04 NOTE — Patient Instructions (Signed)
____________________________________________________________________________________________  Patient Information update  To: All of our patients.  Re: Name change.  It has been made official that our current name, "[facility]"   will soon be changed to "[facility]".   The purpose of this change is to eliminate any confusion created by the concept of our practice being a "Medication Management Pain Clinic". In the past this has led to the misconception that we treat pain primarily by the use of prescription medications.  Nothing can be farther from the truth.   Understanding PAIN MANAGEMENT: To further understand what our practice does, you first have to understand that "Pain Management" is a subspecialty that requires additional training once a physician has completed their specialty training, which can be in either Anesthesia, Neurology, Psychiatry, or Physical Medicine and Rehabilitation (PMR). Each one of these contributes to the final approach taken by each physician to the management of their patient's pain. To be a "Pain Management Specialist" you must have first completed one of the specialty trainings below.  Anesthesiologists - trained in clinical pharmacology and interventional techniques such as nerve blockade and regional as well as central neuroanatomy. They are trained to block pain before, during, and after surgical interventions.  Neurologists - trained in the diagnosis and pharmacological treatment of complex neurological conditions, such as Multiple Sclerosis, Parkinson's, spinal cord injuries, and other systemic conditions that may be associated with symptoms that may include but are not limited to pain. They tend to rely primarily on the treatment of chronic pain using prescription medications.  Psychiatrist - trained in conditions affecting the psychosocial  wellbeing of patients including but not limited to depression, anxiety, schizophrenia, personality disorders, addiction, and other substance use disorders that may be associated with chronic pain. They tend to rely primarily on the treatment of chronic pain using prescription medications.   Physical Medicine and Rehabilitation (PMR) physicians, also known as physiatrists - trained to treat a wide variety of medical conditions affecting the brain, spinal cord, nerves, bones, joints, ligaments, muscles, and tendons. Their training is primarily aimed at treating patients that have suffered injuries that have caused severe physical impairment. Their training is primarily aimed at the physical therapy and rehabilitation of those patients. They may also work alongside orthopedic surgeons or neurosurgeons using their expertise in assisting surgical patients to recover after their surgeries.  INTERVENTIONAL PAIN MANAGEMENT is sub-subspecialty of Pain Management.  Our physicians are Board-certified in Anesthesia, Pain Management, and Interventional Pain Management.  This meaning that not only have they been trained and Board-certified in their specialty of Anesthesia, and subspecialty of Pain Management, but they have also received further training in the sub-subspecialty of Interventional Pain Management, in order to become Board-certified as INTERVENTIONAL PAIN MANAGEMENT SPECIALIST.    Mission: Our goal is to use our skills in  INTERVENTIONAL PAIN MANAGEMENT as alternatives to the chronic use of prescription opioid medications for the treatment of pain. To make this more clear, we have changed our name to reflect what we do and offer. We will continue to offer medication management assessment and recommendations, but we will not be taking over any patient's medication management.  ____________________________________________________________________________________________      _______________________________________________________________________  Medication Rules  Purpose: To inform patients, and their family members, of our medication rules and regulations.  Applies to: All patients receiving prescriptions from our practice (written or electronic).  Pharmacy of record: This is the pharmacy where your electronic prescriptions   will be sent. Make sure we have the correct one.  Electronic prescriptions: In compliance with the [place] Strengthen Opioid Misuse Prevention (STOP) Act of 2017 (Session Law 2017-74/H243), effective January 09, 2018, all controlled substances must be electronically prescribed. Written prescriptions, faxing, or calling prescriptions to a pharmacy will no longer be done.  Prescription refills: These will be provided only during in-person appointments. No medications will be renewed without a "face-to-face" evaluation with your provider. Applies to all prescriptions.  NOTE: The following applies primarily to controlled substances (Opioid* Pain Medications).   Type of encounter (visit): For patients receiving controlled substances, face-to-face visits are required. (Not an option and not up to the patient.)  Patient's responsibilities: Pain Pills: Bring all pain pills to every appointment (except for procedure appointments). Pill Bottles: Bring pills in original pharmacy bottle. Bring bottle, even if empty. Always bring the bottle of the most recent fill.  Medication refills: You are responsible for knowing and keeping track of what medications you are taking and when is it that you will need a refill. The day before your appointment: write a list of all prescriptions that need to be refilled. The day of the appointment: give the list to the admitting nurse. Prescriptions will be written only during appointments. No prescriptions will be written on procedure days. If you forget a medication: it will not be "Called in", "Faxed", or  "electronically sent". You will need to get another appointment to get these prescribed. No early refills. Do not call asking to have your prescription filled early. Partial  or short prescriptions: Occasionally your pharmacy may not have enough pills to fill your prescription.  NEVER ACCEPT a partial fill or a prescription that is short of the total amount of pills that you were prescribed.  With controlled substances the law allows 72 hours for the pharmacy to complete the prescription.  If the prescription is not completed within 72 hours, the pharmacist will require a new prescription to be written. This means that you will be short on your medicine and we WILL NOT send another prescription to complete your original prescription.  Instead, request the pharmacy to send a carrier to a nearby branch to get enough medication to provide you with your full prescription. Prescription Accuracy: You are responsible for carefully inspecting your prescriptions before leaving our office. Have the discharge nurse carefully go over each prescription with you, before taking them home. Make sure that your name is accurately spelled, that your address is correct. Check the name and dose of your medication to make sure it is accurate. Check the number of pills, and the written instructions to make sure they are clear and accurate. Make sure that you are given enough medication to last until your next medication refill appointment. Taking Medication: Take medication as prescribed. When it comes to controlled substances, taking less pills or less frequently than prescribed is permitted and encouraged. Never take more pills than instructed. Never take the medication more frequently than prescribed.  Inform other Doctors: Always inform, all of your healthcare providers, of all the medications you take. Pain Medication from other Providers: You are not allowed to accept any additional pain medication from any other Doctor or  Healthcare provider. There are two exceptions to this rule. (see below) In the event that you require additional pain medication, you are responsible for notifying us, as stated below. Cough Medicine: Often these contain an opioid, such as codeine or hydrocodone. Never accept or take cough medicine containing   these opioids if you are already taking an opioid* medication. The combination may cause respiratory failure and death. Medication Agreement: You are responsible for carefully reading and following our Medication Agreement. This must be signed before receiving any prescriptions from our practice. Safely store a copy of your signed Agreement. Violations to the Agreement will result in no further prescriptions. (Additional copies of our Medication Agreement are available upon request.) Laws, Rules, & Regulations: All patients are expected to follow all Federal and State Laws, Statutes, Rules, & Regulations. Ignorance of the Laws does not constitute a valid excuse.  Illegal drugs and Controlled Substances: The use of illegal substances (including, but not limited to marijuana and its derivatives) and/or the illegal use of any controlled substances is strictly prohibited. Violation of this rule may result in the immediate and permanent discontinuation of any and all prescriptions being written by our practice. The use of any illegal substances is prohibited. Adopted CDC guidelines & recommendations: Target dosing levels will be at or below 60 MME/day. Use of benzodiazepines** is not recommended.  Exceptions: There are only two exceptions to the rule of not receiving pain medications from other Healthcare Providers. Exception #1 (Emergencies): In the event of an emergency (i.e.: accident requiring emergency care), you are allowed to receive additional pain medication. However, you are responsible for: As soon as you are able, call our office [phone], at any time of the day or night, and leave a  message stating your name, the date and nature of the emergency, and the name and dose of the medication prescribed. In the event that your call is answered by a member of our staff, make sure to document and save the date, time, and the name of the person that took your information.  Exception #2 (Planned Surgery): In the event that you are scheduled by another doctor or dentist to have any type of surgery or procedure, you are allowed (for a period no longer than 30 days), to receive additional pain medication, for the acute post-op pain. However, in this case, you are responsible for picking up a copy of our "Post-op Pain Management for Surgeons" handout, and giving it to your surgeon or dentist. This document is available at our office, and does not require an appointment to obtain it. Simply go to our office during business hours (Monday-Thursday from 8:00 AM to 4:00 PM) (Friday 8:00 AM to 12:00 Noon) or if you have a scheduled appointment with us, prior to your surgery, and ask for it by name. In addition, you are responsible for: calling our office [phone], at any time of the day or night, and leaving a message stating your name, name of your surgeon, type of surgery, and date of procedure or surgery. Failure to comply with your responsibilities may result in termination of therapy involving the controlled substances. Medication Agreement Violation. Following the above rules, including your responsibilities will help you in avoiding a Medication Agreement Violation ("Breaking your Pain Medication Contract").  Consequences:  Not following the above rules may result in permanent discontinuation of medication prescription therapy.  *Opioid medications include: morphine, codeine, oxycodone, oxymorphone, hydrocodone, hydromorphone, meperidine, tramadol, tapentadol, buprenorphine, fentanyl, methadone. **Benzodiazepine medications include: diazepam (Valium), alprazolam (Xanax), clonazepam (Klonopine),  lorazepam (Ativan), clorazepate (Tranxene), chlordiazepoxide (Librium), estazolam (Prosom), oxazepam (Serax), temazepam (Restoril), triazolam (Halcion) (Last updated: 11/01/2021) ______________________________________________________________________    ______________________________________________________________________  Medication Recommendations and Reminders  Applies to: All patients receiving prescriptions (written and/or electronic).  Medication Rules & Regulations: You are responsible   for reading, knowing, and following our "Medication Rules" document. These exist for your safety and that of others. They are not flexible and neither are we. Dismissing or ignoring them is an act of "non-compliance" that may result in complete and irreversible termination of such medication therapy. For safety reasons, "non-compliance" will not be tolerated. As with the [place] fundamental legal principle of "ignorance of the law is no defense", we will accept no excuses for not having read and knowing the content of documents provided to you by our practice.  Pharmacy of record:  Definition: This is the pharmacy where your electronic prescriptions will be sent.  We do not endorse any particular pharmacy. It is up to you and your insurance to decide what pharmacy to use.  We do not restrict you in your choice of pharmacy. However, once we write for your prescriptions, we will NOT be re-sending more prescriptions to fix restricted supply problems created by your pharmacy, or your insurance.  The pharmacy listed in the electronic medical record should be the one where you want electronic prescriptions to be sent. If you choose to change pharmacy, simply notify our nursing staff. Changes will be made only during your regular appointments and not over the phone.  Recommendations: Keep all of your pain medications in a safe place, under lock and key, even if you live alone. We will NOT replace lost, stolen, or  damaged medication. We do not accept "Police Reports" as proof of medications having been stolen. After you fill your prescription, take 1 week's worth of pills and put them away in a safe place. You should keep a separate, properly labeled bottle for this purpose. The remainder should be kept in the original bottle. Use this as your primary supply, until it runs out. Once it's gone, then you know that you have 1 week's worth of medicine, and it is time to come in for a prescription refill. If you do this correctly, it is unlikely that you will ever run out of medicine. To make sure that the above recommendation works, it is very important that you make sure your medication refill appointments are scheduled at least 1 week before you run out of medicine. To do this in an effective manner, make sure that you do not leave the office without scheduling your next medication management appointment. Always ask the nursing staff to show you in your prescription , when your medication will be running out. Then arrange for the receptionist to get you a return appointment, at least 7 days before you run out of medicine. Do not wait until you have 1 or 2 pills left, to come in. This is very poor planning and does not take into consideration that we may need to cancel appointments due to bad weather, sickness, or emergencies affecting our staff. DO NOT ACCEPT A "Partial Fill": If for any reason your pharmacy does not have enough pills/tablets to completely fill or refill your prescription, do not allow for a "partial fill". The law allows the pharmacy to complete that prescription within 72 hours, without requiring a new prescription. If they do not fill the rest of your prescription within those 72 hours, you will need a separate prescription to fill the remaining amount, which we will NOT provide. If the reason for the partial fill is your insurance, you will need to talk to the pharmacist about payment alternatives for  the remaining tablets, but again, DO NOT ACCEPT A PARTIAL FILL, unless you can trust   your pharmacist to obtain the remainder of the pills within 72 hours.  Prescription refills and/or changes in medication(s):  Prescription refills, and/or changes in dose or medication, will be conducted only during scheduled medication management appointments. (Applies to both, written and electronic prescriptions.) No refills on procedure days. No medication will be changed or started on procedure days. No changes, adjustments, and/or refills will be conducted on a procedure day. Doing so will interfere with the diagnostic portion of the procedure. No phone refills. No medications will be "called into the pharmacy". No Fax refills. No weekend refills. No Holliday refills. No after hours refills.  Remember:  Business hours are:  Monday to Thursday 8:00 AM to 4:00 PM Provider's Schedule: Quentyn Kolbeck, MD - Appointments are:  Medication management: Monday and Wednesday 8:00 AM to 4:00 PM Procedure day: Tuesday and Thursday 7:30 AM to 4:00 PM Bilal Lateef, MD - Appointments are:  Medication management: Tuesday and Thursday 8:00 AM to 4:00 PM Procedure day: Monday and Wednesday 7:30 AM to 4:00 PM (Last update: 11/01/2021) ______________________________________________________________________    ____________________________________________________________________________________________  Pharmacy Shortages of Pain Medication   Introduction Shockingly as it may seem, .  "No U.[place] Supreme Court decision has ever interpreted the Constitution as guaranteeing a right to health care for all Americans." - https://www.healthequityandpolicylab.com/elusive-right-to-health-care-under-us-law  "With respect to human rights, the [place] has no formally codified right to health, nor does it participate in a human rights treaty that specifies a right to health." - Scott J. Schweikart, JD,  MBE  Situation By now, most of our patients have had the experience of being told by their pharmacist that they do not have enough medication to cover their prescription. If you have not had this experience, just know that you soon will.  Problem There appears to be a shortage of these medications, either at the national level or locally. This is happening with all pharmacies. When there is not enough medication, patients are offered a partial fill and they are told that they will try to get the rest of the medicine for them at a later time. If they do not have enough for even a partial fill, the pharmacists are telling the patients to call us (the prescribing physicians) to request that we send another prescription to another pharmacy to get the medicine.   This reordering of a controlled substance creates documentation problems where additional paperwork needs to be created to explain why two prescriptions for the same period of time and the same medicine are being prescribed to the same patient. It also creates situations where the last appointment note does not accurately reflect when and what prescriptions were given to a patient. This leads to prescribing errors down the line, in subsequent follow-up visits.   [place] Board of Pharmacy ([place]BOP) Research revealed that Board of Pharmacy Rule .1806 (21 NCAC 46.1806) authorizes pharmacists to the transfer of prescriptions among pharmacies, and it sets forth procedural and recordkeeping requirements for doing so. However, this requires the pharmacist to complete the previously mentioned procedural paperwork to accomplish the transfer. As it turns out, it is much easier for them to have the prescribing physicians do the work.   Possible solutions 1. You can ask your physician to assist you in weaning yourself off these medications. 2. Ask your pharmacy if the medication is in stock, 3 days prior to your refill. 3. If you need a pharmacy change,  let us know at your medication management visit. Prescriptions that have already been   electronically sent to a pharmacy will not be re-sent to a different pharmacy if your pharmacy of record does not have it in stock. Proper stocking of medication is a pharmacy problem, not a prescriber problem. Work with your pharmacist to solve the problem. 4. Have the [place] State Assembly add a provision to the "STOP ACT" (the law that mandates how controlled substances are prescribed) where there is an exception to the electronic prescribing rule that states that in the event there are shortages of medications the physicians are allowed to use written prescriptions as opposed to electronic ones. This would allow patients to take their prescriptions to a different pharmacy that may have enough medication available to fill the prescription. The problem is that currently there is a law that does not allow for written prescriptions, with the exception of instances where the electronic medical record is down due to technical issues.  5. Have [place] Congress ease the pressure on pharmaceutical companies, allowing them to produce enough quantities of the medication to adequately supply the population. 6. Have pharmacies keep enough stocks of these medications to cover their client base.  7. Have the [place] State Assembly add a provision to the "STOP ACT" where they ease the regulations surrounding the transfer of controlled substances between pharmacies, so as to simplify the transfer of supplies. As an alternative, develop a system to allow patients to obtain the remainder of their prescription at another one of their pharmacies or at an associate pharmacy.   How this shortage will affect you.  Understand that this is a pharmacy supply problem, not a prescriber problem. Work with your pharmacy to solve it. The job of the prescriber is to evaluate and monitor the patient for the appropriate indications and use of  these medicines. It is not the job of the prescriber to supply the medication or to solve problems with that supply. The responsibility and the choice to obtain the medication resides on the patient. By law, supplying the medication is the job of the pharmacy. It is certainly not the job of the prescriber to solve supply problems.   Due to the above problems we are no longer taking patients to write for their pain medication. Future discussions with your physician may include potentially weaning medications or transitioning to alternatives.  We will be focusing primarily on interventional based pain management. We will continue to evaluate for appropriate indications and we may provide recommendations regarding medication, dose, and schedule, as well as monitoring recommendations, however, we will not be taking over the actual prescribing of these substances. On those patients where we are treating their chronic pain with interventional therapies, exceptions will be considered on a case by case basis. At this time, we will try to continue providing this supplemental service to those patients we have been managing in the past. However, as of August 1st, 2023, we no longer will be sending additional prescriptions to other pharmacies for the purpose of solving their supply problems. Once we send a prescription to a pharmacy, we will not be resending it again to another pharmacy to cover for their shortages.   What to do. Write as many letters as you can. Recruit the help of family members in writing these letters. Below are some of the places where you can write to make your voice heard. Let them know what the problem is and push them to look for solutions.   Search internet for: "[place] find your legislators" https://www.ncleg.gov/findyourlegislators  Search   internet for: "[place] insurance commissioner  complaints" https://www.ncdoi.gov/contactscomplaints/assistance-or-file-complaint  Search internet for: "[place] Board of Pharmacy complaints" http://www.ncbop.org/contact.htm  Search internet for: "[facility] pharmacy complaints" Email [facility] Customer Relations https://www.[facility].com/help/email-[email or web address].jsp?callType=store  Search internet for: "[facility] pharmacy customer service complaints" https://www.[facility].com/topic/marketing/contactus/contactus_customerservice.jsp  ____________________________________________________________________________________________     ____________________________________________________________________________________________  Drug Holidays (Slow)  What is a "Drug Holiday"? Drug Holiday: is the name given to the period of time during which a patient stops taking a medication(s) for the purpose of eliminating tolerance to the drug.  Benefits Improved effectiveness of opioids. Decreased opioid dose needed to achieve benefits. Improved pain with lesser dose.  What is tolerance? Tolerance: is the progressive decreased in effectiveness of a drug due to its repetitive use. With repetitive use, the body gets use to the medication and as a consequence, it loses its effectiveness. This is a common problem seen with opioid pain medications. As a result, a larger dose of the drug is needed to achieve the same effect that used to be obtained with a smaller dose.  How long should a "Drug Holiday" last? You should stay off of the pain medicine for at least 14 consecutive days. (2 weeks)  Should I stop the medicine "cold turkey"? No. You should always coordinate with your Pain Specialist so that he/she can provide you with the correct medication dose to make the transition as smoothly as possible.  How do I stop the medicine? Slowly. You will be instructed to decrease the daily amount of pills that you take by one (1) pill every seven (7) days.  This is called a "slow downward taper" of your dose. For example: if you normally take four (4) pills per day, you will be asked to drop this dose to three (3) pills per day for seven (7) days, then to two (2) pills per day for seven (7) days, then to one (1) per day for seven (7) days, and at the end of those last seven (7) days, this is when the "Drug Holiday" would start.   Will I have withdrawals? By doing a "slow downward taper" like this one, it is unlikely that you will experience any significant withdrawal symptoms. Typically, what triggers withdrawals is the sudden stop of a high dose opioid therapy. Withdrawals can usually be avoided by slowly decreasing the dose over a prolonged period of time. If you do not follow these instructions and decide to stop your medication abruptly, withdrawals may be possible.  What are withdrawals? Withdrawals: refers to the wide range of symptoms that occur after stopping or dramatically reducing opiate drugs after heavy and prolonged use. Withdrawal symptoms do not occur to patients that use low dose opioids, or those who take the medication sporadically. Contrary to benzodiazepine (example: Valium, Xanax, etc.) or alcohol withdrawals ("Delirium Tremens"), opioid withdrawals are not lethal. Withdrawals are the physical manifestation of the body getting rid of the excess receptors.  Expected Symptoms Early symptoms of withdrawal may include: Agitation Anxiety Muscle aches Increased tearing Insomnia Runny nose Sweating Yawning  Late symptoms of withdrawal may include: Abdominal cramping Diarrhea Dilated pupils Goose bumps Nausea Vomiting  Will I experience withdrawals? Due to the slow nature of the taper, it is very unlikely that you will experience any.  What is a slow taper? Taper: refers to the gradual decrease in dose.  (Last update:  07/30/2019) ____________________________________________________________________________________________    ____________________________________________________________________________________________  CBD (cannabidiol) & Delta-8 (Delta-8 tetrahydrocannabinol) WARNING  Intro: Cannabidiol (CBD) and tetrahydrocannabinol (THC), are two natural compounds found in   plants of the Cannabis genus. They can both be extracted from hemp or cannabis. Hemp and cannabis come from the Cannabis sativa plant. Both compounds interact with your body's endocannabinoid system, but they have very different effects. CBD does not produce the high sensation associated with cannabis. Delta-8 tetrahydrocannabinol, also known as delta-8 THC, is a psychoactive substance found in the Cannabis sativa plant, of which marijuana and hemp are two varieties. THC is responsible for the high associated with the illicit use of marijuana.  Applicable to: All individuals currently taking or considering taking CBD (cannabidiol) and, more important, all patients taking opioid analgesic controlled substances (pain medication). (Example: oxycodone; oxymorphone; hydrocodone; hydromorphone; morphine; methadone; tramadol; tapentadol; fentanyl; buprenorphine; butorphanol; dextromethorphan; meperidine; codeine; etc.)  Legal status: CBD remains a Schedule I drug prohibited for any use. CBD is illegal with one exception. In the [place], CBD has a limited Food and Drug Administration (FDA) approval for the treatment of two specific types of epilepsy disorders. Only one CBD product has been approved by the FDA for this purpose: "Epidiolex". FDA is aware that some companies are marketing products containing cannabis and cannabis-derived compounds in ways that violate the Federal Food, Drug and Cosmetic Act (FD&C Act) and that may put the health and safety of consumers at risk. The FDA, a Federal agency, has not enforced the CBD status since 2018.  UPDATE: (02/25/2021) The Drug Enforcement Agency (DEA) issued a letter stating that "delta" cannabinoids, including Delta-8-THCO and Delta-9-THCO, synthetically derived from hemp do not qualify as hemp and will be viewed as Schedule I drugs. (Schedule I drugs, substances, or chemicals are defined as drugs with no currently accepted medical use and a high potential for abuse. Some examples of Schedule I drugs are: heroin, lysergic acid diethylamide (LSD), marijuana (cannabis), 3,4-methylenedioxymethamphetamine (ecstasy), methaqualone, and peyote.) (https://www.dea.gov)  Legality: Some manufacturers ship CBD products nationally, which is illegal. Often such products are sold online and are therefore available throughout the country. CBD is openly sold in head shops and health food stores in some states where such sales have not been explicitly legalized. Selling unapproved products with unsubstantiated therapeutic claims is not only a violation of the law, but also can put patients at risk, as these products have not been proven to be safe or effective. Federal illegality makes it difficult to conduct research on CBD.  Reference: "FDA Regulation of Cannabis and Cannabis-Derived Products, Including Cannabidiol (CBD)" - https://www.fda.gov/news-events/public-health-focus/fda-regulation-cannabis-and-cannabis-derived-products-including-cannabidiol-cbd  Warning: CBD is not FDA approved and has not undergo the same manufacturing controls as prescription drugs.  This means that the purity and safety of available CBD may be questionable. Most of the time, despite manufacturer's claims, it is contaminated with THC (delta-9-tetrahydrocannabinol - the chemical in marijuana responsible for the "HIGH").  When this is the case, the THC contaminant will trigger a positive urine drug screen (UDS) test for Marijuana (carboxy-THC). Because a positive UDS for any illicit substance is a violation of our medication agreement, your  opioid analgesics (pain medicine) may be permanently discontinued. The FDA recently put out a warning about 5 things that everyone should be aware of regarding Delta-8 THC: Delta-8 THC products have not been evaluated or approved by the FDA for safe use and may be marketed in ways that put the public health at risk. The FDA has received adverse event reports involving delta-8 THC-containing products. Delta-8 THC has psychoactive and intoxicating effects. Delta-8 THC manufacturing often involve use of potentially harmful chemicals to create the concentrations of delta-8 THC claimed in   the marketplace. The final delta-8 THC product may have potentially harmful by-products (contaminants) due to the chemicals used in the process. Manufacturing of delta-8 THC products may occur in uncontrolled or unsanitary settings, which may lead to the presence of unsafe contaminants or other potentially harmful substances. Delta-8 THC products should be kept out of the reach of children and pets.  MORE ABOUT CBD  General Information: CBD was discovered in 1940 and it is a derivative of the cannabis sativa genus plants (Marijuana and Hemp). It is one of the 113 identified substances found in Marijuana. It accounts for up to 40% of the plant's extract. As of 2018, preliminary clinical studies on CBD included research for the treatment of anxiety, movement disorders, and pain. CBD is available and consumed in multiple forms, including inhalation of smoke or vapor, as an aerosol spray, and by mouth. It may be supplied as an oil containing CBD, capsules, dried cannabis, or as a liquid solution. CBD is thought not to be as psychoactive as THC (delta-9-tetrahydrocannabinol - the chemical in marijuana responsible for the "HIGH"). Studies suggest that CBD may interact with different biological target receptors in the body, including cannabinoid and other neurotransmitter receptors. As of 2018 the mechanism of action for its  biological effects has not been determined.  Side-effects  Adverse reactions: Dry mouth, diarrhea, decreased appetite, fatigue, drowsiness, malaise, weakness, sleep disturbances, and others.  Drug interactions: CBC may interact with other medications such as blood-thinners. Because CBD causes drowsiness on its own, it also increases the drowsiness caused by other medications, including antihistamines (such as Benadryl), benzodiazepines (Xanax, Ativan, Valium), antipsychotics, antidepressants and opioids, as well as alcohol and supplements such as kava, melatonin and St. John's Wort. Be cautious with the following combinations:   Brivaracetam (Briviact) Brivaracetam is changed and broken down by the body. CBD might decrease how quickly the body breaks down brivaracetam. This might increase levels of brivaracetam in the body.  Caffeine Caffeine is changed and broken down by the body. CBD might decrease how quickly the body breaks down caffeine. This might increase levels of caffeine in the body.  Carbamazepine (Tegretol) Carbamazepine is changed and broken down by the body. CBD might decrease how quickly the body breaks down carbamazepine. This might increase levels of carbamazepine in the body and increase its side effects.  Citalopram (Celexa) Citalopram is changed and broken down by the body. CBD might decrease how quickly the body breaks down citalopram. This might increase levels of citalopram in the body and increase its side effects.  Clobazam (Onfi) Clobazam is changed and broken down by the liver. CBD might decrease how quickly the liver breaks down clobazam. This might increase the effects and side effects of clobazam.  Eslicarbazepine (Aptiom) Eslicarbazepine is changed and broken down by the body. CBD might decrease how quickly the body breaks down eslicarbazepine. This might increase levels of eslicarbazepine in the body by a small amount.  Everolimus (Zostress) Everolimus is  changed and broken down by the body. CBD might decrease how quickly the body breaks down everolimus. This might increase levels of everolimus in the body.  Lithium Taking higher doses of CBD might increase levels of lithium. This can increase the risk of lithium toxicity.  Medications changed by the liver (Cytochrome P450 1A1 (CYP1A1) substrates) Some medications are changed and broken down by the liver. CBD might change how quickly the liver breaks down these medications. This could change the effects and side effects of these medications.  Medications changed by   the liver (Cytochrome P450 1A2 (CYP1A2) substrates) Some medications are changed and broken down by the liver. CBD might change how quickly the liver breaks down these medications. This could change the effects and side effects of these medications.  Medications changed by the liver (Cytochrome P450 1B1 (CYP1B1) substrates) Some medications are changed and broken down by the liver. CBD might change how quickly the liver breaks down these medications. This could change the effects and side effects of these medications.  Medications changed by the liver (Cytochrome P450 2A6 (CYP2A6) substrates) Some medications are changed and broken down by the liver. CBD might change how quickly the liver breaks down these medications. This could change the effects and side effects of these medications.  Medications changed by the liver (Cytochrome P450 2B6 (CYP2B6) substrates) Some medications are changed and broken down by the liver. CBD might change how quickly the liver breaks down these medications. This could change the effects and side effects of these medications.  Medications changed by the liver (Cytochrome P450 2C19 (CYP2C19) substrates) Some medications are changed and broken down by the liver. CBD might change how quickly the liver breaks down these medications. This could change the effects and side effects of these  medications.  Medications changed by the liver (Cytochrome P450 2C8 (CYP2C8) substrates) Some medications are changed and broken down by the liver. CBD might change how quickly the liver breaks down these medications. This could change the effects and side effects of these medications.  Medications changed by the liver (Cytochrome P450 2C9 (CYP2C9) substrates) Some medications are changed and broken down by the liver. CBD might change how quickly the liver breaks down these medications. This could change the effects and side effects of these medications.  Medications changed by the liver (Cytochrome P450 2D6 (CYP2D6) substrates) Some medications are changed and broken down by the liver. CBD might change how quickly the liver breaks down these medications. This could change the effects and side effects of these medications.  Medications changed by the liver (Cytochrome P450 2E1 (CYP2E1) substrates) Some medications are changed and broken down by the liver. CBD might change how quickly the liver breaks down these medications. This could change the effects and side effects of these medications.  Medications changed by the liver (Cytochrome P450 3A4 (CYP3A4) substrates) Some medications are changed and broken down by the liver. CBD might change how quickly the liver breaks down these medications. This could change the effects and side effects of these medications.  Medications changed by the liver (Glucuronidated drugs) Some medications are changed and broken down by the liver. CBD might change how quickly the liver breaks down these medications. This could change the effects and side effects of these medications.  Medications that decrease the breakdown of other medications by the liver (Cytochrome P450 2C19 (CYP2C19) inhibitors) CBD is changed and broken down by the liver. Some drugs decrease how quickly the liver changes and breaks down CBD. This could change the effects and side effects of  CBD.  Medications that decrease the breakdown of other medications in the liver (Cytochrome P450 3A4 (CYP3A4) inhibitors) CBD is changed and broken down by the liver. Some drugs decrease how quickly the liver changes and breaks down CBD. This could change the effects and side effects of CBD.  Medications that increase breakdown of other medications by the liver (Cytochrome P450 3A4 (CYP3A4) inducers) CBD is changed and broken down by the liver. Some drugs increase how quickly the liver changes and breaks down   CBD. This could change the effects and side effects of CBD.  Medications that increase the breakdown of other medications by the liver (Cytochrome P450 2C19 (CYP2C19) inducers) CBD is changed and broken down by the liver. Some drugs increase how quickly the liver changes and breaks down CBD. This could change the effects and side effects of CBD.  Methadone (Dolophine) Methadone is broken down by the liver. CBD might decrease how quickly the liver breaks down methadone. Taking cannabidiol along with methadone might increase the effects and side effects of methadone.  Rufinamide (Banzel) Rufinamide is changed and broken down by the body. CBD might decrease how quickly the body breaks down rufinamide. This might increase levels of rufinamide in the body by a small amount.  Sedative medications (CNS depressants) CBD might cause sleepiness and slowed breathing. Some medications, called sedatives, can also cause sleepiness and slowed breathing. Taking CBD with sedative medications might cause breathing problems and/or too much sleepiness.  Sirolimus (Rapamune) Sirolimus is changed and broken down by the body. CBD might decrease how quickly the body breaks down sirolimus. This might increase levels of sirolimus in the body.  Stiripentol (Diacomit) Stiripentol is changed and broken down by the body. CBD might decrease how quickly the body breaks down stiripentol. This might increase levels of  stiripentol in the body and increase its side effects.  Tacrolimus (Prograf) Tacrolimus is changed and broken down by the body. CBD might decrease how quickly the body breaks down tacrolimus. This might increase levels of tacrolimus in the body.  Tamoxifen (Soltamox) Tamoxifen is changed and broken down by the body. CBD might affect how quickly the body breaks down tamoxifen. This might affect levels of tamoxifen in the body.  Topiramate (Topamax) Topiramate is changed and broken down by the body. CBD might decrease how quickly the body breaks down topiramate. This might increase levels of topiramate in the body by a small amount.  Valproate Valproic acid can cause liver injury. Taking cannabidiol with valproic acid might increase the chance of liver injury. CBD and/or valproic acid might need to be stopped, or the dose might need to be reduced.  Warfarin (Coumadin) CBD might increase levels of warfarin, which can increase the risk for bleeding. CBD and/or warfarin might need to be stopped, or the dose might need to be reduced.  Zonisamide Zonisamide is changed and broken down by the body. CBD might decrease how quickly the body breaks down zonisamide. This might increase levels of zonisamide in the body by a small amount. (Last update: 03/09/2021) ____________________________________________________________________________________________   ____________________________________________________________________________________________  Naloxone Nasal Spray  Why am I receiving this medication? [place] STOP ACT requires that all patients taking high dose opioids or at risk of opioids respiratory depression, be prescribed an opioid reversal agent, such as Naloxone (AKA: Narcan).  What is this medication? NALOXONE (nal OX one) treats opioid overdose, which causes slow or shallow breathing, severe drowsiness, or trouble staying awake. Call emergency services after using this medication.  You may need additional treatment. Naloxone works by reversing the effects of opioids. It belongs to a group of medications called opioid blockers.  COMMON BRAND NAME(S): Kloxxado, Narcan  What should I tell my care team before I take this medication? They need to know if you have any of these conditions: Heart disease Substance use disorder An unusual or allergic reaction to naloxone, other medications, foods, dyes, or preservatives Pregnant or trying to get pregnant Breast-feeding  When to use this medication? This medication is to   be used for the treatment of respiratory depression (less than 8 breaths per minute) secondary to opioid overdose.   How to use this medication? This medication is for use in the nose. Lay the person on their back. Support their neck with your hand and allow the head to tilt back before giving the medication. The nasal spray should be given into 1 nostril. After giving the medication, move the person onto their side. Do not remove or test the nasal spray until ready to use. Get emergency medical help right away after giving the first dose of this medication, even if the person wakes up. You should be familiar with how to recognize the signs and symptoms of a narcotic overdose. If more doses are needed, give the additional dose in the other nostril. Talk to your care team about the use of this medication in children. While this medication may be prescribed for children as young as newborns for selected conditions, precautions do apply.  Naloxone Overdosage: If you think you have taken too much of this medicine contact a poison control center or emergency room at once.  NOTE: This medicine is only for you. Do not share this medicine with others.  What if I miss a dose? This does not apply.  What may interact with this medication? This is only used during an emergency. No interactions are expected during emergency use. This list may not describe all possible  interactions. Give your health care provider a list of all the medicines, herbs, non-prescription drugs, or dietary supplements you use. Also tell them if you smoke, drink alcohol, or use illegal drugs. Some items may interact with your medicine.  What should I watch for while using this medication? Keep this medication ready for use in the case of an opioid overdose. Make sure that you have the phone number of your care team and local hospital ready. You may need to have additional doses of this medication. Each nasal spray contains a single dose. Some emergencies may require additional doses. After use, bring the treated person to the nearest hospital or call 911. Make sure the treating care team knows that the person has received a dose of this medication. You will receive additional instructions on what to do during and after use of this medication before an emergency occurs.  What side effects may I notice from receiving this medication? Side effects that you should report to your care team as soon as possible: Allergic reactions--skin rash, itching, hives, swelling of the face, lips, tongue, or throat Side effects that usually do not require medical attention (report these to your care team if they continue or are bothersome): Constipation Dryness or irritation inside the nose Headache Increase in blood pressure Muscle spasms Stuffy nose Toothache This list may not describe all possible side effects. Call your doctor for medical advice about side effects. You may report side effects to FDA at 1-800-FDA-1088.  Where should I keep my medication? Because this is an emergency medication, you should keep it with you at all times.  Keep out of the reach of children and pets. Store between 20 and 25 degrees C (68 and 77 degrees F). Do not freeze. Throw away any unused medication after the expiration date. Keep in original box until ready to use.  NOTE: This sheet is a summary. It may not cover  all possible information. If you have questions about this medicine, talk to your doctor, pharmacist, or health care provider.   2023 Elsevier/Gold Standard (  2020-09-03 00:00:00)  ____________________________________________________________________________________________   

## 2021-12-05 ENCOUNTER — Ambulatory Visit: Payer: 59 | Attending: Pain Medicine | Admitting: Pain Medicine

## 2021-12-05 ENCOUNTER — Encounter: Payer: Self-pay | Admitting: Pain Medicine

## 2021-12-05 VITALS — BP 146/99 | HR 88 | Temp 97.9°F | Ht 62.0 in | Wt 302.0 lb

## 2021-12-05 DIAGNOSIS — M961 Postlaminectomy syndrome, not elsewhere classified: Secondary | ICD-10-CM | POA: Insufficient documentation

## 2021-12-05 DIAGNOSIS — Z79899 Other long term (current) drug therapy: Secondary | ICD-10-CM | POA: Diagnosis present

## 2021-12-05 DIAGNOSIS — Z79891 Long term (current) use of opiate analgesic: Secondary | ICD-10-CM | POA: Insufficient documentation

## 2021-12-05 DIAGNOSIS — M79604 Pain in right leg: Secondary | ICD-10-CM | POA: Insufficient documentation

## 2021-12-05 DIAGNOSIS — M47816 Spondylosis without myelopathy or radiculopathy, lumbar region: Secondary | ICD-10-CM | POA: Insufficient documentation

## 2021-12-05 DIAGNOSIS — M159 Polyosteoarthritis, unspecified: Secondary | ICD-10-CM | POA: Insufficient documentation

## 2021-12-05 DIAGNOSIS — G8929 Other chronic pain: Secondary | ICD-10-CM | POA: Insufficient documentation

## 2021-12-05 DIAGNOSIS — M797 Fibromyalgia: Secondary | ICD-10-CM | POA: Insufficient documentation

## 2021-12-05 DIAGNOSIS — G894 Chronic pain syndrome: Secondary | ICD-10-CM | POA: Diagnosis present

## 2021-12-05 DIAGNOSIS — M5137 Other intervertebral disc degeneration, lumbosacral region: Secondary | ICD-10-CM | POA: Diagnosis present

## 2021-12-05 DIAGNOSIS — M5441 Lumbago with sciatica, right side: Secondary | ICD-10-CM | POA: Insufficient documentation

## 2021-12-05 DIAGNOSIS — M533 Sacrococcygeal disorders, not elsewhere classified: Secondary | ICD-10-CM | POA: Diagnosis present

## 2021-12-05 DIAGNOSIS — M25552 Pain in left hip: Secondary | ICD-10-CM | POA: Diagnosis present

## 2021-12-05 DIAGNOSIS — G35 Multiple sclerosis: Secondary | ICD-10-CM | POA: Insufficient documentation

## 2021-12-05 MED ORDER — MORPHINE SULFATE 15 MG PO TABS: 15.0000 mg | ORAL_TABLET | Freq: Three times a day (TID) | ORAL | 0 refills | Status: DC

## 2021-12-05 NOTE — Progress Notes (Signed)
Nursing Pain Medication Assessment:  Safety precautions to be maintained throughout the outpatient stay will include: orient to surroundings, keep bed in low position, maintain call bell within reach at all times, provide assistance with transfer out of bed and ambulation.  Medication Inspection Compliance: Pill count conducted under aseptic conditions, in front of the patient. Neither the pills nor the bottle was removed from the patient's sight at any time. Once count was completed pills were immediately returned to the patient in their original bottle.  Medication: Morphine IR Pill/Patch Count:  23 of 90 pills remain Pill/Patch Appearance: Markings consistent with prescribed medication Bottle Appearance: Standard pharmacy container. Clearly labeled. Filled Date: 9 / 4 / 2023 Last Medication intake:  TodaySafety precautions to be maintained throughout the outpatient stay will include: orient to surroundings, keep bed in low position, maintain call bell within reach at all times, provide assistance with transfer out of bed and ambulation.

## 2022-01-24 NOTE — Therapy (Signed)
OUTPATIENT PHYSICAL THERAPY WHEELCHAIR EVALUATION   Patient Name: Tiffany Herring MRN: [id number] DOB:March 10, 1960, 62 y.o., female Today's Date: 01/25/2022  END OF SESSION:  PT End of Session - 01/25/22 1656     Visit Number 1    Number of Visits 1    Date for PT Re-Evaluation 01/25/22    PT Start Time 1430    PT Stop Time 1558    PT Time Calculation (min) 88 min    Equipment Utilized During Treatment Gait belt    Activity Tolerance Patient limited by pain    Behavior During Therapy Agitated             Past Medical History:  Diagnosis Date   Allergy    Arthritis    Asthma    Chronic kidney disease    Depression    Diabetes mellitus without complication ([place])    Hypertension    Multiple sclerosis ([place])    Neuromuscular disorder ([place])    Sleep apnea    Past Surgical History:  Procedure Laterality Date   [place] and 1996   x 2   Patient Active Problem List   Diagnosis Date Noted   DDD (degenerative disc disease), lumbosacral 12/01/2020   Chronic use of opiate for therapeutic purpose [id number]   Uncomplicated opioid dependence ([place]) 01/27/2020   Pharmacologic therapy 02/06/2018   Disorder of skeletal system 02/06/2018   Problems influencing health status 02/06/2018   CKD (chronic kidney disease) 04/24/2017   Morbid obesity with BMI of 50.0-59.9, adult ([place]) 01/19/2017   Chronic pain syndrome 02/03/2016   Venous stasis ulcer ([place]) 08/09/2015   Breast cancer screening 08/05/2015   Chronic flank pain (Right) 02/15/2015   Chronic lower extremity pain (Right) 02/15/2015   Lumbar spondylosis 11/17/2014   Grade 1 Anterolisthesis of L4/L5 11/17/2014   Chronic sacroiliac joint pain (bilateral) 11/17/2014   Chronic hip pain (Left) 11/17/2014   Arthropathy of left hip (Severe chronic left hip DJD) 11/17/2014   Osteoarthritis of hip (Left) 11/17/2014   Lumbar facet arthropathy ([place]) 11/17/2014   Lumbar facet hypertrophy 11/17/2014   Lumbar facet  syndrome (Bilateral) 11/17/2014   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   Epidural fibrosis 11/16/2014   Chronic low back pain (Bilateral) w/ sciatica (Right) 11/16/2014   Chronic lumbar radicular pain (Right) 11/16/2014   Long term current use of opiate analgesic 11/16/2014   Long term prescription opiate use 11/16/2014   Opiate use (80 MME/Day) 11/16/2014   Opiate dependence ([place]) 11/16/2014   Encounter for therapeutic drug level monitoring 11/16/2014   Allergic rhinitis 11/16/2014   Essential (primary) hypertension 11/16/2014   Fibromyalgia 11/16/2014   Type 2 diabetes mellitus treated with insulin ([place]) 05/25/2012   Type 2 diabetes mellitus with stage 3 chronic kidney disease, with long-term current use of insulin ([place]) 05/25/2012   Colon polyp 05/24/2012   Acid reflux 05/24/2012   H/O nutritional disorder 05/24/2012   Class III Morbid obesity ([place]) (254% higher incidence of chronic low back pain) 05/24/2012   Degenerative arthritis of hip 05/24/2012   Awareness of heartbeats 05/24/2012   Post menopausal syndrome 05/24/2012   Pure hypercholesterolemia 05/24/2012   Post-menopausal 05/24/2012   OA (osteoarthritis) 05/24/2012   Osteoarthrosis, hip 05/24/2012   Splenomegaly 05/23/2012   Chronic kidney disease (CKD), stage III (moderate) ([place]) 02/20/2012   Carotid artery bruit 08/11/2011   Diabetes mellitus ([place]) 08/11/2011   Dysthymia 08/11/2011   Generalized OA 08/11/2011   History of intermenstrual bleeding 08/11/2011  History of migraine 08/11/2011   Hypercholesterolemia 08/11/2011   Other long term (current) drug therapy 08/11/2011   Menopausal and perimenopausal disorder 08/11/2011   Multiple sclerosis ([place]) 08/11/2011   Obstructive apnea 08/11/2011   History of colon polyps 08/11/2011   B12 deficiency 08/11/2011   Vitamin D deficiency 08/11/2011   GERD (gastroesophageal reflux disease) 08/11/2011   Palpitations 08/11/2011   Vitamin B12 deficiency 08/11/2011    Carotid bruit 08/11/2011   Long term use of drug 08/11/2011   Essential hypertension with goal blood pressure less than 140/90 08/11/2011   Generalized osteoarthritis of multiple sites 08/11/2011   History of colonic polyps 08/11/2011   History of irregular menstrual bleeding 08/11/2011   Menopausal and postmenopausal disorder 08/11/2011   Obstructive sleep apnea 08/11/2011    PCP: Hortencia Pilar MD  REFERRING PROVIDER: Hortencia Pilar MD  REFERRING DIAG: MS  THERAPY DIAG:  Muscle weakness (generalized)  ONSET DATE: 2001  Rationale for Evaluation and Treatment: Rehabilitation  SUBJECTIVE:                                                                                                                                                                                           SUBJECTIVE STATEMENT: Patient presents for wheelchair eval.   PERTINENT HISTORY:  Patient presents for wheelchair evaluation for her MS. PMH includes: DM type II, CKD, HTN, hyperparathyroidism, anemia, OA, GERD, migraine headaches, chronic pain syndrome, MS failed back surgical syndrome, chronic low back pain with sciatica, fibromyalgia, grade 1 anterolisthesis of L4-5, chronic LE pain, venous stasis ulcer, DDD. Patient is a retired Marine scientist. She is currently seeing wound management for a ulcer to L heel.  PAIN:  See below  PRECAUTIONS: Fall  WEIGHT BEARING RESTRICTIONS:  restrict use of LLE due to wound.   FALLS:  Has patient fallen in last 6 months? Yes. Number of falls 1  LIVING ENVIRONMENT: Lives with: lives with their family Lives in: House/apartment Stairs:  ramp Has following equipment at home: Grab bars, Ramped entry, and power chair  OCCUPATION: retired Marine scientist.   PLOF:  uses power chair at baseline.   PATIENT GOALS: to obtain power chair.    PATIENT INFORMATION: This Evaluation form will serve as the LMN for the following suppliers:  Supplier: [employer] Contact Person:Brandon  Sisk [phone]   Reason for Referral: Patient/caregiver Goals: Patient was seen for face-to-face evaluation for new power wheelchair.  Also present was    Erlene Quan from [facility]     to discuss recommendations and wheelchair options.  Further paperwork was completed and sent to vendor.  Patient appears to qualify for power mobility device at this time per  objective findings.   MEDICAL HISTORY: Diagnosis:Multiple Sclerosis Primary Diagnosis Onset: [x] Progressive Disease Relevant Past and Future Surgeries: Height: 5 ft 1 Weight: 316 lb  Explain and recent changes or trends in weight: no changes   Relevant History including falls: Patient presents for wheelchair evaluation for her MS. PMH includes: DM type II, CKD, HTN, hyperparathyroidism, anemia, OA, GERD, migraine headaches, chronic pain syndrome, MS failed back surgical syndrome, chronic low back pain with sciatica, fibromyalgia, grade 1 anterolisthesis of L4-5, chronic LE pain, venous stasis ulcer, DDD. Patient is a retired Marine scientist. She is currently seeing wound management for a ulcer to L heel. Had a fall four months ago, standing in the bathroom, had to call 911 to get off the floor.     HOME ENVIRONMENT: [x] House  [] Condo/town home  [] Apartment  [] Assisted Living    [] Lives Alone [x]  Lives with Others      : family                                              Hours with caregiver:   [x] Home is accessible to patient            Stairs  [] Yes [x]  No     Ramp [x] Yes [] No Comments:  Garage to main floor, and ramp on front door.  Widened doors, house built for patient, is accessible for her. Countertops lowered. Widened sliding glass door. Have grab bars in the shower, getting one installed by the toilet.     COMMUNITY ADL: TRANSPORTATION: [x] Car    [] Van    [] Public Transportation    [] Adapted w/c Lift   [] Ambulance   [] Other:       [] Sits in wheelchair during transport  Employment/School:     Specific requirements  pertaining to mobility                                                     Other:    Needs assistance to get into and out of car.                                    FUNCTIONAL/SENSORY PROCESSING SKILLS:  Handedness:   [x] Right     [] Left    [] NA  Comments: is right handed but uses L hand to propel chair due to R hand pain.                                 Functional Processing Skills for Wheeled Mobility [x] Processing Skills are adequate for safe wheelchair operation  Areas of concern than may interfere with safe operation of wheelchair Description of problem   []  Attention to environment     [] Judgment     []  Hearing  []  Vision or visual processing    [] Motor Planning  []  Fluctuations in Behavior  VERBAL COMMUNICATION: [x] WFL receptive [x]  WFL expressive [] Understandable  [] Difficult to understand  [] non-communicative []  Uses an augmented communication device    CURRENT SEATING / MOBILITY: Current Mobility Base:   [] None  [] Dependent  [] Manual  [] Scooter  [x] Power   Type of Control:  standard: L joystick                     Manufacturer:    [employer] [id number] HD    Size: XL  Age:   purchased 2548     (62 years old)                   Current Condition of Mobility Base:      old and has had multiple repairs                                                                                                               Current Wheelchair components:    elevating leg rests, recline                                                                                                                                Describe posture in present seating system:      Excessive right trunk lean, over the side of the chair.                                                                       SENSATION and SKIN ISSUES: Sensation [] Intact [x] Impaired RLE [] Absent   Level of sensation:   entire right side of body limited with sensation                         Pressure Relief: Able to perform effective pressure relief :   [] Yes  [x]  No Method:  uses tilt option in wheelchair  If not, Why?: limited UE strength, high pain levels of arms and back.                                                                          Skin Issues/Skin Integrity Current Skin Issues   [x] Yes [] No  [] Intact []  Red area [x]  Open Area  [] Scar Tissue [x] At risk from prolonged sitting  Where   LLE; seeing wound care for heel, also get pressure ulcers on bottom (last about 2 years ago).                           History of Skin Issues   [x] Yes [] No  Where   buttocks, L heel                                       When                                               Hx of skin flap surgeries [] Yes [x] No  Where                  When                                                  Limited sitting tolerance [] Yes [x] No Hours spent sitting in wheelchair daily:    12 hours                                                     Complaint of Pain:  Please describe:        Neck pain: worst : 3/10 Lower back: 6-7/10: worst 9/10 Shoulders: 7/10 Elbows 6/10 LLE Legs: 9/10                                                                                                      Swelling/Edema:    Bilateral LE swelling.  ADL STATUS (in reference to wheelchair use):  Indep Assist Unable Indep with Equip Not assessed Comments  Dressing             x                                        30% assistance; needs help for putting pants, socks, shoes.                     Eating    x                                                                                                                          Toileting              x                                                  Needs assistance for stand pivot transfer                                                            Bathing             x                                                          30% assistance                                                               Grooming/ Hygiene   x  Meal Prep                 x                                          Family performs                                                                IADLS                 x                                               Family performs                                                   Bowel Management: [] Continent  [x] Incontinent  [x] Accidents Comments:   On medication for incontinence                                               Bladder Management: [] Continent  [x] Incontinent  [x] Accidents Comments:    On medication for incontinence                                            WHEELCHAIR SKILLS: Manual w/c Propulsion: [] UE or LE strength and endurance sufficient to participate in ADLs using manual wheelchair Arm :  [] left [] right  [] Both                                   Foot:   [] left [] right  [] Both  Distance:   Operate Scooter: []  Strength, hand grip, balance and transfer appropriate for use [] Living environment is accessible for use of scooter  Operate Power w/c:  [x]  Std. Joystick   []  Alternative Controls Indep [x]  Assist []  Dependent/ Unable []  N/A []  [x] Safe          []  Functional      Distance: baseline level of mobility is with power chair               Bed confined without wheelchair [x]  Yes []  No   STRENGTH/RANGE OF MOTION:  Range of Motion Strength  Shoulder    WFL: shoulder extension limited bilaterally                                            3/5 , painful  Elbow    WFL                                               RLE  3/5 LLE 3+/5  , painful                                                           Wrist/Hand     WFL                                                                          RLE 2+/5     LLE 3+/5        , painful                                             Hip     Hip extension limited to neutral: hip flexion limited by body habitus.                                                                  2+/5 grossly   , painful                                                   Knee    Limited by body habitus                                                             3+/5; painful                                                              Ankle Limited bilaterally ; unable to fully test LLE due to unna boot        Unable to fully test LLE due to [employer]  MOBILITY/BALANCE:  []  Patient is totally dependent for mobility                                                                                               Balance Transfers Ambulation  Sitting Balance: Standing Balance: []  Independent []  Independent/Modified Independent  []  WFL     []  WFL []  Supervision []  Supervision  []  Uses UE for balance  []  Supervision []  Min Assist []  Ambulates with Assist                           []  Min Assist []  Min assist []  Mod Assist []  Ambulates with Device:  []  RW   []  StW   []  Cane   []                 [x]  Mod Assist; significant lean to the right []  Mod assist [x]  Max assist; unable to perform without assistance   []  Max Assist [x]  Max assist []  Dependent []  Indep. Short Distance Only  []  Unable []  Unable []  Lift / Sling Required Distance (in feet)                             []  Sliding board [x]  Unable to Ambulate: (Explain: non ambulatory for past 5 years)   Cardio Status:  [] Intact  [x]  Impaired   []  NA                High BP               Respiratory Status:  [] Intact   [x] Impaired   [] NA        COPD                              Orthotics/Prosthetics:    n/a                                                                      Comments (Address manual vs power w/c vs scooter):     Patient is a 62 year old woman with MS. She has been non ambulatory for > 5 years and at baseline utilizes a power chair. She does not have the functional strength to propel a manual chair and has significant pain with movement of limbs. Patient has significant right trunk lean and limited trunk stabilization making use of a scooter unsafe. Patient additionally has chronic pain syndrome, chronic low back pain with sciatica, fibromyalgia, and grade I anterolisthesis of L4-5.She has singficiant right sided weakness of her body that limits transfers, requiring mod/max A for all transfers. She is completely power wheelchair dependent at this time. She will require use of a power chair for optimal mobility and to increase independence  Anterior / Posterior Obliquity Rotation-Pelvis  PELVIS    [] Neutral  [x]  Posterior  []  Anterior     [] WFL  [x] Right Elevated  [] Left Elevated   [x] WFL  [] Right Anterior []   Left Anterior    []  Fixed [x]  Partly Flexible []  Flexible  []  Other  []  Fixed  [x]  Partly Flexible  []  Flexible []  Other  []  Fixed  [x]  Partly Flexible  []  Flexible []  Other  TRUNK [] WFL [x] Thoracic Kyphosis [] Lumbar Lordosis   [x]  WFL [] Convex Right [] Convex Left   [] c-curve [] s-curve [] multiple  [x]  Neutral []  Left-anterior []  Right-anterior    []  Fixed []  Flexible [x]  Partly Flexible       Other  []  Fixed []  Flexible [x]  Partly Flexible []  Other  []  Fixed           []  Flexible [x]  Partly Flexible []  Other   Position Windswept   HIPS  []  Neutral [x]  Abduct []  ADduct []  Neutral [x]  Right []  Left       []  Fixed  []  Partly Flexible             []  Dislocated []  Flexible []  Subluxed    []  Fixed []  Partly Flexible  []  Flexible []  Other               Foot Positioning Knee Positioning   Knees and  Feet  [x]  WFL [] Left [] Right [x]  WFL [] Left [] Right   KNEES ROM concerns: ROM concerns:   & Dorsi-Flexed                    [] Lt [] Rt                                  FEET Plantar Flexed                  [] Lt [] Rt     Inversion                    [] Lt [] Rt     Eversion                    [] Lt [] Rt    HEAD [x]  Functional [x]  Good Head Control   & []  Flexed         []  Extended []  Adequate Head Control   NECK []  Rotated  Lt  []  Lat Flexed Lt []  Rotated  Rt []  Lat Flexed Rt []  Limited Head Control    []  Cervical Hyperextension []  Absent  Head Control    SHOULDERS ELBOWS WRIST& HAND         Left     Right    Left     Right  U/E [] Functional  Left            [] Functional  Right                                 [] Fisting             [] Fisting     [] elevated Left [x] depressed  Left [] elevated Right [x] depressed  Right      [x] protracted Left [] retracted Left [x] protracted Right [] retracted Right [] subluxed  Left              [] subluxed  Right         Goals for Wheelchair  Mobility  [x]  Independence with mobility in the home with motor related ADLs (MRADLs)  [x]  Independence with MRADLs in the community []  Provide dependent mobility  [x]  Provide recline     [x] Provide tilt   Goals for Seating system [x]  Optimize pressure distribution [x]  Provide support needed to facilitate function or safety [x]  Provide corrective forces to assist with maintaining or improving posture [x]  Accommodate client's posture: current seated postures and positions are not flexible or will not tolerate corrective forces [x]  Client to be independent with relieving pressure in the wheelchair [] Enhance physiological function such as breathing, swallowing, digestion  Simulation ideas/Equipment trials:   Quantum 4 front power chair.                                                                                              State why other equipment was  unsuccessful:          Patient is a 62 year old female who has been non ambulatory for > 5 years. She currently is utilizing a power chair for all mobility and requires mod/max A for transfers.   PMH includes DM type II, CKD, HTN, hyperparathyroidism, anemia, OA, GERD, migraine headaches, chronic pain syndrome, MS failed back surgical syndrome, chronic low back pain with sciatica, fibromyalgia, grade 1 anterolisthesis of L4-5, chronic LE pain, venous stasis ulcer, DDD. Patient has limited sensation of R side of body and her strength is more impaired on RUE and LE. She has significant trunk lean to right side. She is unable to independently  pressure relief relying on her power chair for position changes. Patient is unable to propel a manual wheelchair due to weakness and pain. She additionally does not have the trunk control to utilize a scooter.     Patient will benefit from a Quantum 4 front power chair to provide transportation and promote independent mobility.  She will require powered backward tilt for pressure relief, to reduce her bilateral LE edema, and to change position against gravity. She requires heavy duty due to her weight being greater than 250 lb. An angle adjustable back with improve postural control, accommodate seating system, provide lateral trunk support, posterior trunk support, and reduce significant right trunk lean. A J fusion seat cushion is required due to impaired R sided sensation, history of pressure ulcers, pelvic rotation due to R trunk lean, and LE positioning.  Patient will require pelvic thigh support for lateral thigh guides due to excessive R trunk lean out of chair to accommodate pelvis. She will require bilateral lateral trunk supports for decreased lateral trunk leaning to the R for safety and to accommodate the asymmetry of her body. Mounting hardware for lateral trunk supports, back, headrest, thigh supports will be required and be swing away for attaching seat, back  cushion, postural supports, and headrest to chair for optimal body positioning and support. Patient requires adjustable height, flip back full length armrests to allow her to eat at tables, provide support for arms, and change position/height for ADL performance. Elevating heavy duty articulating power leg rests are required to provide LE support and elevate LE's during recline for  reducing edema, supporting body habitus, and positioning of LE's due to history of L heel wound. Left and right center mount flip up depth angle adjustable foot support required to provide foot support, and change position for caregiver assisted transfers. Flat free tire inserts required to decrease maintenance and prevent frequent flats. Use of a headrest will allow for support during tilt and recline to reduce edema and provide posterior head and neck support. Use of a belt will increase safety and decrease risk of falling out of chair.  Proportional left wheelchair control will be required due to right hand being impacted by patient's MS. Use of a multiple actuator control module will allow patient to operate the power set functions via the joystick to independently manage recline, swelling of LE's, and pressure relief. Patient will require updated electronics to operate power seat functions via joystick control.  A swing away mount for joystick will allow access to tables for ADL and caregiver assisted transfers. Patient will require Group 24x2 batteries to power chair.   Patient will require a seat elevator due to Needing elevation for transfers, decreasing fall risk, reaching countertop, top of fridge, cook, turn lights on/off, turn thermostat to correct temperature. In conclusion, patient will require  a Engineer, civil (consulting) for functional mobility and to improve quality of life.                                                 MOBILITY BASE RECOMMENDATIONS and JUSTIFICATION: MOBILITY COMPONENT JUSTIFICATION   Manufacturer:   Quantum        Model:   4 front           Size: Width    24       Seat Depth     22        [x] provide transport from point A to B [x] promote Indep mobility  [x] is not a safe, functional ambulator [x] walker or cane inadequate [x] non-standard width/depth necessary to accommodate anatomical measurement []                             [] Manual Mobility Base [] non-functional ambulator    [] Scooter/POV  [] can safely operate  [] can safely transfer   [] has adequate trunk stability  [] cannot functionally propel manual w/c  [x] Power Mobility Base  [x] non-ambulatory  [x] cannot functionally propel manual wheelchair  [x]  cannot functionally and safely operate scooter/POV [x] can safely operate and willing to  [] Stroller Base [] infant/child  [] unable to propel manual wheelchair [] allows for growth [] non-functional ambulator [] non-functional UE [] Indep mobility is not a goal at this time  [x] Tilt  [] Forward                   [x] Backward                  [x] Powered tilt              [] Manual tilt  [x] change position against gravitational force on head and shoulders  [x] change position for pressure relief/cannot weight shift [] transfers  [] management of tone [x] rest periods [x] control edema [x] facilitate postural control  []                                       []   Recline  [] Power recline on power base [] Manual recline on manual base  [] accommodate femur to back angle  [] bring to full recline for ADL care  [] change position for pressure relief/cannot weight shift [] rest periods [] repositioning for transfers or clothing/diaper /catheter changes [] head positioning  [] Lighter weight required [] self- propulsion  [] lifting []                                                 [x] Heavy Duty required [x] user weight greater than 250# [] extreme tone/ over active movement [] broken frame on previous chair []                                     [x]  Back  [x]  Angle Adjustable []  Custom molded                            [x] postural control [] control of tone/spasticity [] accommodation of range of motion [] UE functional control [x] accommodation for seating system []                                          [x] provide lateral trunk support [] accommodate deformity [x] provide posterior trunk support [x] provide lumbar/sacral support [x] support trunk in midline [] Pressure relief over spinal processes  [x]  Seat Cushion J fusion                        [x] impaired sensation  [] decubitus ulcers present [x] history of pressure ulceration [] prevent pelvic extension [] low maintenance  [x] stabilize pelvis  [] accommodate obliquity [] accommodate multiple deformity [x] neutralize lower extremity position [x] increase pressure distribution []                                           [x]  Pelvic/thigh support  [x]  Lateral thigh guide []  Distal medial pad  []  Distal lateral pad [x]  pelvis in neutral [x] accommodate pelvis [x]  position upper legs [x]  alignment []  accommodate ROM []  decrease adduction [] accommodate tone [x] removable for transfers [] decrease abduction  [x]  Lateral trunk Supports [x]  Lt     [x]  Rt [x] decrease lateral trunk leaning to the R  [] control tone [x] contour for increased contact [x] safety  [x] accommodate asymmetry []                                                 [x]  Mounting hardware  [x] lateral trunk supports  [x] back   [] seat [x] headrest      [x]  thigh support [] fixed   [x] swing away [x] attach seat platform/cushion to w/c frame [x] attach back cushion to w/c frame [x] mount postural supports [x] mount headrest  [] swing medial thigh support away [] swing lateral supports away for transfers  []   Armrests  [] fixed [x] adjustable height [] removable   [] swing away  [x] flip back   [] reclining [x] full length pads [] desk    [] pads tubular  [x] provide support with elbow at 90   [] provide support for w/c  tray [x] change of height/angles for variable activities [x] remove for transfers [x] allow to come closer to table top [x] remove for access to tables []                                               Hangers/ Leg rests  [] 60 [] 70 [] 90 [x] elevating [x] heavy duty  [x] articulating [] fixed [] lift off [] swing away     [x] power [x] provide LE support  [] accommodate to hamstring tightness [x] elevate legs during recline   [] provide change in position for Legs [x] Maintain placement of feet on footplate [x] durability [] enable transfers [x] decrease edema [] Accommodate lower leg length []                                         Foot support Footplate    [x] Lt  [x]  Rt  [x]  Center mount [x] flip up                            [x] depth/angle adjustable [] Amputee adapter    []  Lt     []  Rt [x] provide foot support [] accommodate to ankle ROM [x] transfers [] Provide support for residual extremity []  allow foot to go under wheelchair base []  decrease tone  []                                                 []  Ankle strap/heel loops [] support foot on foot support [] decrease extraneous movement [] provide input to heel  [] protect foot  Tires: [] pneumatic  [x] flat free inserts  [] solid  [x] decrease maintenance  [x] prevent frequent flats [] increase shock absorbency [] decrease pain from road shock [] decrease spasms from road shock []                                              [x]  Headrest  [x] provide posterior head support [x] provide posterior neck support [] provide lateral head support [] provide anterior head support [x] support during tilt and recline [] improve feeding   [] improve respiration [] placement of switches [] safety  [] accommodate ROM  [] accommodate tone [] improve visual orientation  []  Anterior chest strap []  Vest []  Shoulder retractors  [] decrease forward movement of shoulder [] accommodation of TLSO [] decrease forward movement of trunk [] decrease shoulder elevation [] added abdominal  support [] alignment [] assistance with shoulder control  []                                               Pelvic Positioner [x] Belt [] SubASIS bar [] Dual Pull [] stabilize tone [x] decrease falling out of chair/ **will not Decrease potential for sliding due to pelvic tilting [] prevent excessive rotation [] pad for protection over boney prominence [] prominence comfort [] special pull angle to control rotation []   Upper ExtremitySupport  [] L   []  R [] Arm trough   [] hand support []  tray       [] full tray [] swivel mount [] decrease edema      [] decrease subluxation   [] control tone   [] placement for AAC/Computer/EADL [] decrease gravitational pull on shoulders [] provide midline positioning [] provide support to increase UE function [] provide hand support in natural position [] provide work surface   POWER WHEELCHAIR CONTROLS  [x] Proportional  [] Non-Proportional Type                                      [x] Left  [] Right [x] provides access for controlling wheelchair   [] lacks motor control to operate proportional drive control [] unable to understand proportional controls  Actuator Control Module  [] Single  [x] Multiple   [x] Allow the client to operate the power seat function(s) through the joystick control   [] Safety Reset Switches [] Used to change modes and stop the wheelchair when driving in latch mode    [x] Upgraded Electronics   [x] programming for accurate control [x] progressive Disease/changing condition [] non-proportional drive control needed [x] Needed in order to operate power seat functions through joystick control   [] Display box [] Allows user to see in which mode and drive the wheelchair is set  [] necessary for alternate controls    [] Digital interface electronics [] Allows w/c to operate when using alternative drive controls  [] ASL Head Array [] Allows client to operate wheelchair  through switches placed in tri-panel headrest   [] Sip and puff with tubing kit [] needed to operate sip and puff drive controls  [] Upgraded tracking electronics [] increase safety when driving [] correct tracking when on uneven surfaces  [x] Mount for switches or joystick [] Attaches switches to w/c  [x] Swing away for access or transfers [] midline for optimal placement [] provides for consistent access  [] Attendant controlled joystick plus mount [] safety [] long distance driving [] operation of seat functions [] compliance with transportation regulations []                                             Rear wheel placement/Axle adjustability [] None [] semi adjustable [] fully adjustable  [] improved UE access to wheels [] improved stability [] changing angle in space for improvement of postural stability [] 1-arm drive access [] amputee pad placement []                                Wheel rims/ hand rims  [] metal   [] plastic coated [] oblique projections           [] vertical projections [] Provide ability to propel manual wheelchair  []  Increase self-propulsion with hand weakness/decreased grasp  Push handles [] extended   [] angle adjustable              [] standard [] caregiver access [] caregiver assist [] allows "hooking" to enable increased ability to perform ADLs or maintain balance  One armed device   [] Lt   [] Rt [] enable propulsion of manual wheelchair with one arm   []                                            Brake/wheel lock extension []  Lt   []  Rt [] increase indep in applying wheel locks   [] Side guards [] prevent clothing  getting caught in wheel or becoming soiled []  prevent skin tears/abrasions  Battery:     Group 24x 2                                       [x] to power wheelchair                                                         Other:   Seat elevator                                   X Transfers, ADL, iADL performance                                                                                  The above equipment has a life- long  use expectancy. Growth and changes in medical and/or functional conditions would be the exceptions. This is to certify that the therapist has no financial relationship with durable medical provider or manufacturer. The therapist will not receive remuneration of any kind for the equipment recommended in this evaluation.   Patient has mobility limitation that significantly impairs safe, timely participation in one or more mobility related ADL's. (bathing, toileting, feeding, dressing, grooming, moving from room to room)  [x]  Yes []  No  Will mobility device sufficiently improve ability to participate and/or be aided in participation of MRADL's?      [x]  Yes []  No  Can limitation be compensated for with use of a cane or walker?                                    []  Yes [x]  No  Does patient or caregiver demonstrate ability/potential ability & willingness to safely use the mobility device?    [x]  Yes []  No  Does patient's home environment support use of recommended mobility device?            [x]  Yes []  No  Does patient have sufficient upper extremity function necessary to functionally propel a manual wheelchair?     []  Yes [x]  No  Does patient have sufficient strength and trunk stability to safely operate a POV (scooter)?                                  []  Yes [x]  No  Does patient need additional features/benefits provided by a power wheelchair for MRADL's in the home?        [x]  Yes []  No  Does the patient demonstrate the ability to safely use a power wheelchair?                   [x]  Yes []  No     Physician's Name  Printed:                                                        13 Signature:  Date:     This is to certify that I, the above signed therapist have the following affiliations: []  This DME provider []  Manufacturer of recommended equipment []  Patient's long term care facility []  None of the above  Therapist Name/Signature:     Janna Arch, PT, DPT                                         Date:01/25/22  ASSESSMENT:  CLINICAL IMPRESSION: Patient is a 62 year old woman with MS. She has been non ambulatory for > 5 years and at baseline utilizes a power chair. She does not have the functional strength to propel a manual chair and has significant pain with movement of limbs. Patient has significant right trunk lean and limited trunk stabilization making use of a scooter unsafe. Patient additionally has chronic pain syndrome, chronic low back pain with sciatica, fibromyalgia, and grade I anterolisthesis of L4-5.She has singficiant right sided weakness of her body that limits transfers, requiring mod/max A for all transfers. She is completely power wheelchair dependent at this time. She will require use of a power chair for optimal mobility and to increase independence           OBJECTIVE IMPAIRMENTS: Abnormal gait, cardiopulmonary status limiting activity, decreased activity tolerance, decreased balance, decreased coordination, decreased endurance, decreased mobility, difficulty walking, decreased ROM, decreased strength, hypomobility, increased edema, increased muscle spasms, impaired flexibility, impaired sensation, impaired UE functional use, improper body mechanics, postural dysfunction, obesity, and pain.   ACTIVITY LIMITATIONS: carrying, lifting, bending, sitting, standing, squatting, sleeping, stairs, transfers, bed mobility, continence, bathing, toileting, dressing, reach over head, locomotion level, and caring for others  PARTICIPATION LIMITATIONS: meal prep, cleaning, laundry, personal finances, interpersonal relationship, driving, shopping, community activity, and yard work  PERSONAL FACTORS: Age, Behavior pattern, Fitness, Past/current experiences, Social background, Time since onset of injury/illness/exacerbation, and 3+ comorbidities: DM type II, CKD, HTN, hyperparathyroidism, anemia, OA, GERD, migraine headaches, chronic pain syndrome, MS failed back  surgical syndrome, chronic low back pain with sciatica, fibromyalgia, grade 1 anterolisthesis of L4-5, chronic LE pain, venous stasis ulcer, DDD  are also affecting patient's functional outcome.     CLINICAL DECISION MAKING: Evolving/moderate complexity  EVALUATION COMPLEXITY: Moderate  GOALS: Target date: 01/25/22     Pt and caregivers will understand PT recommendation and appropriate/safe use for wheelchair and seating for home use. Baseline: 1/17: Patient understands  Goal status: MET  PLAN:  PT FREQUENCY: one time visit  PT DURATION: other: 1x session     Janna Arch, PT 01/25/2022, 4:58 PM

## 2022-01-25 ENCOUNTER — Ambulatory Visit: Payer: 59 | Attending: Family Medicine

## 2022-01-25 DIAGNOSIS — M6281 Muscle weakness (generalized): Secondary | ICD-10-CM | POA: Diagnosis present

## 2022-03-07 NOTE — Progress Notes (Unsigned)
PROVIDER NOTE: Information contained herein reflects review and annotations entered in association with encounter. Interpretation of such information and data should be left to medically-trained personnel. Information provided to patient can be located elsewhere in the medical record under "Patient Instructions". Document created using STT-dictation technology, any transcriptional errors that may result from process are unintentional.    Patient: Tiffany Herring  Service Category: E/M  Provider: Gaspar Cola, MD  DOB: 1960-07-26  DOS: 03/08/2022  Referring Provider: Hortencia Pilar, MD  MRN: [id number]  Specialty: Interventional Pain Management  PCP: Hortencia Pilar, MD  Type: Established Patient  Setting: Ambulatory outpatient    Location: Office  Delivery: Face-to-face     HPI  [name], a 62 y.o. year old female, is here today because of her No primary diagnosis found.. [name] primary complain today is No chief complaint on file. Last encounter: My last encounter with her was on 12/05/2021. Pertinent problems: [name] has Failed back surgical syndrome; Epidural fibrosis; Chronic low back pain (Bilateral) w/ sciatica (Right); Chronic lumbar radicular pain (Right); Generalized OA; Multiple sclerosis ([place]); Degenerative arthritis of hip; Fibromyalgia; Lumbar spondylosis; Grade 1 Anterolisthesis of L4/L5; Chronic sacroiliac joint pain (bilateral); Chronic hip pain (Left); Arthropathy of left hip (Severe chronic left hip DJD); Osteoarthritis of hip (Left); Lumbar facet arthropathy ([place]); Lumbar facet hypertrophy; Lumbar facet syndrome (Bilateral); Chronic flank pain (Right); Chronic lower extremity pain (Right); Chronic pain syndrome; Venous stasis ulcer ([place]); OA (osteoarthritis); Osteoarthrosis, hip; Generalized osteoarthritis of multiple sites; and DDD (degenerative disc disease), lumbosacral on their pertinent problem list. Pain Assessment: Severity of   is reported as a   /10. Location:    / . Onset:  . Quality:  . Timing:  . Modifying factor(s):  Marland Kitchen Vitals:  vitals were not taken for this visit.  BMI: Estimated body mass index is 55.24 kg/m as calculated from the following:   Height as of 12/05/21: '5\' 2"'$  (1.575 m).   Weight as of 12/05/21: 302 lb (137 kg).  Reason for encounter:  *** . ***  Pharmacotherapy Assessment  Analgesic: MSIR 15 mg, 1 tab PO TID. "Drug Holiday" completed on 02/23/2021.  Martin Majestic from MME of 110 mg/day to 45 mg/day). MME/day: 45 mg/day   Monitoring: [place] PMP: PDMP reviewed during this encounter.       Pharmacotherapy: No side-effects or adverse reactions reported. Compliance: No problems identified. Effectiveness: Clinically acceptable.  No notes on file  No results found for: "CBDTHCR" No results found for: "D8THCCBX" No results found for: "D9THCCBX"  UDS:  Summary  Date Value Ref Range Status  06/15/2021 Note  Final    Comment:    ==================================================================== [employer] [facility] (MW) ==================================================================== Test                             Result       Flag       Units  Drug Present and Declared for Prescription Verification   Morphine                       11584        EXPECTED   ng/mg creat   Normorphine                    189          EXPECTED   ng/mg creat    Potential sources of large amounts of morphine  in the absence of    codeine include administration of morphine or use of heroin.     Normorphine is an expected metabolite of morphine.    Hydromorphone                  90           EXPECTED   ng/mg creat    Hydromorphone may be present as a metabolite of morphine;    concentrations of hydromorphone rarely exceed 5% of the morphine    concentration when this is the source of hydromorphone.  Drug Absent but Declared for Prescription Verification   Oxycodone                      Not Detected UNEXPECTED ng/mg  creat ==================================================================== Test                      Result    Flag   Units      Ref Range   Creatinine              79               mg/dL      >=20 ==================================================================== Declared Medications:  The flagging and interpretation on this report are based on the  following declared medications.  Unexpected results may arise from  inaccuracies in the declared medications.   **Note: The testing scope of this panel includes these medications:   Morphine (MSIR)  Oxycodone   **Note: The testing scope of this panel does not include the  following reported medications:   Albuterol (Ventolin HFA)  Aspirin  Bupropion (Wellbutrin)  Clotrimazole (Mycelex)  Cyanocobalamin  Enalapril (Vasotec)  Furosemide (Lasix)  Gabapentin (Neurontin)  Insulin (Novolin)  Mirtazapine (Remeron)  Montelukast (Singulair)  Naloxone (Narcan)  Oxybutynin (Ditropan)  Timolol (Timoptic)  Tizanidine (Zanaflex) ==================================================================== For clinical consultation, please call [phone]. ====================================================================       ROS  Constitutional: Denies any fever or chills Gastrointestinal: No reported hemesis, hematochezia, vomiting, or acute GI distress Musculoskeletal: Denies any acute onset joint swelling, redness, loss of ROM, or weakness Neurological: No reported episodes of acute onset apraxia, aphasia, dysarthria, agnosia, amnesia, paralysis, loss of coordination, or loss of consciousness  Medication Review  BLOOD GLUCOSE TEST STRIPS, Cholecalciferol, Fifty50 Glucose Meter 2.0, Insulin Syringe-Needle U-100, accu-chek multiclix, albuterol, aspirin, buPROPion, clotrimazole, cyanocobalamin, enalapril, furosemide, gabapentin, glucose blood, insulin regular, mirtazapine, montelukast, morphine, oxybutynin, tiZANidine, and  timolol  History Review  Allergy: [name] is allergic to amoxicillin-pot clavulanate, copaxone  [glatiramer acetate], pseudoephedrine, rofecoxib, sulfa antibiotics, and meloxicam. Drug: [name]  reports no history of drug use. Alcohol:  reports no history of alcohol use. Tobacco:  reports that she has quit smoking. She has never used smokeless tobacco. Social: [name]  reports that she has quit smoking. She has never used smokeless tobacco. She reports that she does not drink alcohol and does not use drugs. Medical:  has a past medical history of Allergy, Arthritis, Asthma, Chronic kidney disease, Depression, Diabetes mellitus without complication ([place]), Hypertension, Multiple sclerosis ([place]), Neuromuscular disorder ([place]), and Sleep apnea. Surgical: [name]  has a past surgical history that includes Cesarean section (1991 and 1996). Family: family history includes Dementia in her mother; Depression in her mother; Diabetes in her mother; Hyperlipidemia in her mother; Stroke in her father.  Laboratory Chemistry Profile   Renal Lab Results  Component Value Date   BUN 18 02/06/2018  CREATININE 1.22 (H) 02/06/2018   BCR 15 02/06/2018   GFRAA 56 (L) 02/06/2018   GFRNONAA 49 (L) 02/06/2018    Hepatic Lab Results  Component Value Date   AST 25 02/06/2018   ALT 19 02/15/2015   ALBUMIN 4.6 02/06/2018   ALKPHOS 97 02/06/2018    Electrolytes Lab Results  Component Value Date   NA 136 02/06/2018   K 4.9 02/06/2018   CL 95 (L) 02/06/2018   CALCIUM 9.8 02/06/2018   MG 1.8 02/06/2018    Bone Lab Results  Component Value Date   25OHVITD1 55 02/06/2018   25OHVITD2 2.9 02/06/2018   25OHVITD3 52 02/06/2018    Inflammation (CRP: Acute Phase) (ESR: Chronic Phase) Lab Results  Component Value Date   CRP 17 (H) 02/06/2018   ESRSEDRATE 28 02/06/2018         Note: Above Lab results reviewed.  Recent Imaging Review  US RENAL CLINICAL DATA:  Chronic renal  disease  EXAM: RENAL / URINARY TRACT ULTRASOUND COMPLETE  COMPARISON:  None Available.  FINDINGS: Right Kidney:  Renal measurements: 10.3 x 4.9 x 5.4 cm = volume: 143 mL. Diffuse increased echogenicity.  Left Kidney:  Renal measurements: 9.9 x 4.6 x 5.2 cm = volume: 125 mL. Diffuse increased echogenicity.  Bladder:  Appears normal for degree of bladder distention.  Other:  None.  IMPRESSION: Diffuse increased echogenicity in both kidneys is consistent with medical renal disease. No other abnormalities.  Electronically Signed   By: Dorise Bullion III M.D.   On: 11/25/2021 14:49 Note: Reviewed        Physical Exam  General appearance: Well nourished, well developed, and well hydrated. In no apparent acute distress Mental status: Alert, oriented x 3 (person, place, & time)       Respiratory: No evidence of acute respiratory distress Eyes: PERLA Vitals: There were no vitals taken for this visit. BMI: Estimated body mass index is 55.24 kg/m as calculated from the following:   Height as of 12/05/21: '5\' 2"'$  (1.575 m).   Weight as of 12/05/21: 302 lb (137 kg). Ideal: Patient weight not recorded  Assessment   Diagnosis Status  No diagnosis found. Controlled Controlled Controlled   Updated Problems: No problems updated.  Plan of Care  Problem-specific:  No problem-specific Assessment & Plan notes found for this encounter.  [name] has a current medication list which includes the following long-term medication(s): albuterol, bupropion, furosemide, gabapentin, insulin regular, mirtazapine, montelukast, morphine, morphine, and morphine.  Pharmacotherapy (Medications Ordered): No orders of the defined types were placed in this encounter.  Orders:  No orders of the defined types were placed in this encounter.  Follow-up plan:   No follow-ups on file.      Interventional Therapies  Risk  Complexity Considerations:   Estimated body mass index is  54.87 kg/m as calculated from the following:   Height as of this encounter: '5\' 2"'$  (1.575 m).   Weight as of this encounter: 300 lb (136.1 kg). WNL   Planned  Pending:   (03/02/2021) today we have restarted the patient on the opioid analgesics.  We will be starting her with morphine ER 30 mg, 1 tab p.o. twice daily (60 MME).  We will reevaluate her in 1 month and determine if she is doing well with this new dose after the "Drug Holiday".  Should she feel that this is not enough, we will need to order genetic testing to see if the issue is that she is  an ultra-rapid metabolizer.  She has indicated that the oxycodone does not help her pain in any way.   Under consideration:    "Drug Holiday" completed on 02/23/2021.  Total daily dose brought down from 110 MME/day to 60.   Completed:   None   Therapeutic  Palliative (PRN) options:   None   Pharmacological Recommendations:   "Drug Holiday" completed on 02/23/2021.       Recent Visits No visits were found meeting these conditions. Showing recent visits within past 90 days and meeting all other requirements Future Appointments Date Type Provider Dept  03/08/22 Appointment Milinda Pointer, MD [facility]  Showing future appointments within next 90 days and meeting all other requirements  I discussed the assessment and treatment plan with the patient. The patient was provided an opportunity to ask questions and all were answered. The patient agreed with the plan and demonstrated an understanding of the instructions.  Patient advised to call back or seek an in-person evaluation if the symptoms or condition worsens.  Duration of encounter: *** minutes.  Total time on encounter, as per AMA guidelines included both the face-to-face and non-face-to-face time personally spent by the physician and/or other qualified health care professional(s) on the day of the encounter (includes time in activities that require the physician or  other qualified health care professional and does not include time in activities normally performed by clinical staff). Physician's time may include the following activities when performed: Preparing to see the patient (e.g., pre-charting review of records, searching for previously ordered imaging, lab work, and nerve conduction tests) Review of prior analgesic pharmacotherapies. Reviewing PMP Interpreting ordered tests (e.g., lab work, imaging, nerve conduction tests) Performing post-procedure evaluations, including interpretation of diagnostic procedures Obtaining and/or reviewing separately obtained history Performing a medically appropriate examination and/or evaluation Counseling and educating the patient/family/caregiver Ordering medications, tests, or procedures Referring and communicating with other health care professionals (when not separately reported) Documenting clinical information in the electronic or other health record Independently interpreting results (not separately reported) and communicating results to the patient/ family/caregiver Care coordination (not separately reported)  Note by: Gaspar Cola, MD Date: 03/08/2022; Time: 1:00 PM

## 2022-03-08 ENCOUNTER — Ambulatory Visit: Payer: 59 | Attending: Pain Medicine | Admitting: Pain Medicine

## 2022-03-08 ENCOUNTER — Encounter: Payer: Self-pay | Admitting: Pain Medicine

## 2022-03-08 VITALS — BP 166/79 | HR 82 | Temp 97.9°F | Resp 18 | Ht 62.0 in | Wt 307.0 lb

## 2022-03-08 DIAGNOSIS — M47816 Spondylosis without myelopathy or radiculopathy, lumbar region: Secondary | ICD-10-CM | POA: Insufficient documentation

## 2022-03-08 DIAGNOSIS — G8929 Other chronic pain: Secondary | ICD-10-CM | POA: Diagnosis present

## 2022-03-08 DIAGNOSIS — M159 Polyosteoarthritis, unspecified: Secondary | ICD-10-CM | POA: Diagnosis present

## 2022-03-08 DIAGNOSIS — M5137 Other intervertebral disc degeneration, lumbosacral region: Secondary | ICD-10-CM | POA: Insufficient documentation

## 2022-03-08 DIAGNOSIS — M533 Sacrococcygeal disorders, not elsewhere classified: Secondary | ICD-10-CM | POA: Diagnosis present

## 2022-03-08 DIAGNOSIS — M150 Primary generalized (osteo)arthritis: Secondary | ICD-10-CM | POA: Diagnosis not present

## 2022-03-08 DIAGNOSIS — G894 Chronic pain syndrome: Secondary | ICD-10-CM

## 2022-03-08 DIAGNOSIS — M5441 Lumbago with sciatica, right side: Secondary | ICD-10-CM | POA: Insufficient documentation

## 2022-03-08 DIAGNOSIS — M961 Postlaminectomy syndrome, not elsewhere classified: Secondary | ICD-10-CM

## 2022-03-08 DIAGNOSIS — M79604 Pain in right leg: Secondary | ICD-10-CM | POA: Insufficient documentation

## 2022-03-08 DIAGNOSIS — M25552 Pain in left hip: Secondary | ICD-10-CM | POA: Diagnosis present

## 2022-03-08 DIAGNOSIS — G35 Multiple sclerosis: Secondary | ICD-10-CM | POA: Diagnosis present

## 2022-03-08 DIAGNOSIS — Z79899 Other long term (current) drug therapy: Secondary | ICD-10-CM | POA: Diagnosis present

## 2022-03-08 DIAGNOSIS — Z79891 Long term (current) use of opiate analgesic: Secondary | ICD-10-CM | POA: Diagnosis present

## 2022-03-08 MED ORDER — MORPHINE SULFATE 15 MG PO TABS: 15.0000 mg | ORAL_TABLET | Freq: Three times a day (TID) | ORAL | 0 refills | Status: DC

## 2022-03-08 NOTE — Patient Instructions (Signed)
_________________________________________________________________________________________  Body mass index (BMI) Weight Management Required  URGENT: Dear Ms. Wilmer your weight has been found to be adversely affecting your health. Aggressive action is immediately required. Talk to your primary care physician.  Body mass index (BMI) is a common tool for deciding whether a person has an appropriate body weight.  It measures a persons weight in relation to their height.   According to the [facility] of health (NIH): A BMI of less than 18.5 means that a person is underweight. A BMI of between 18.5 and 24.9 is ideal. A BMI of between 25 and 29.9 is overweight. A BMI over 30 indicates obesity.  Body Mass Index (BMI) Classification BMI level (kg/m2) Category Associated incidence of chronic pain  <18  Underweight   18.5-24.9 Ideal body weight   25-29.9 Overweight  20%  30-34.9 Obese (Class I)  68%  35-39.9 Severe obesity (Class II)  136%  >40 Extreme obesity (Class III)  254%   Your current Estimated body mass index is 55.24 kg/m as calculated from the following:   Height as of 12/05/21: '5\' 2"'$  (1.575 m).   Weight as of 12/05/21: 302 lb (137 kg).  Morbidly Obese Classification: You will be considered to be "Morbidly Obese" if your BMI is above 30 and you have one or more of the following conditions caused or associated to obesity: 1.    Type 2 Diabetes (Leading to cardiovascular diseases (CVD), stroke, peripheral vascular diseases (PVD), retinopathy, nephropathy, and neuropathy) 2.    Cardiovascular Disease (High Blood Pressure; Congestive Heart Failure; High Cholesterol; Coronary Artery Disease; Angina; Arrhythmias, Dysrhythmias, or Heart Attacks) 3.    Breathing problems (Asthma; obesity-hypoventilation syndrome; obstructive sleep apnea; chronic inflammatory airway disease; reactive airway disease; or shortness of breath) 4.    Chronic kidney disease 5.    Liver disease  (nonalcoholic fatty liver disease) 6.    High blood pressure 7.    Acid reflux (gastroesophageal reflux disease; heartburn) 8.    Osteoarthritis (OA) (affecting the hip(s), the knee(s) and/or the lower back) 9.    Low back pain (Lumbar Facet Syndrome; and/or Degenerative Disc Disease) 10.  Hip pain (Osteoarthritis of hip) (For every 1 lbs of added body weight, there is a 2 lbs increase in pressure inside of each hip articulation. 1:2 mechanical relationship) 11.  Knee pain (Osteoarthritis of knee) (For every 1 lbs of added body weight, there is a 4 lbs increase in pressure inside of each knee articulation. 1:4 mechanical relationship) (patients with a BMI>30 kg/m2 were 6.8 times more likely to develop knee OA than normal-weight individuals) 12.  Cancer: Epidemiological studies have shown that obesity is a risk factor for: post-menopausal breast cancer; cancers of the endometrium, colon and kidney cancer; malignant adenomas of the oesophagus. Obese subjects have an approximately 1.5-3.5-fold increased risk of developing these cancers compared with normal-weight subjects, and it has been estimated that between 15 and 45% of these cancers can be attributed to overweight. More recent studies suggest that obesity may also increase the risk of other types of cancer, including pancreatic, hepatic and gallbladder cancer. (Ref: Obesity and cancer. Pischon T, Nthlings U, [employer] H. Proc Nutr Soc. 2008 May;67(2):128-45. doi: [id number].) The International Agency for Research on Cancer (IARC) has identified 13 cancers associated with overweight and obesity: meningioma, multiple myeloma, adenocarcinoma of the esophagus, and cancers of the thyroid, postmenopausal breast cancer, gallbladder, stomach, liver, pancreas, kidney, ovaries, uterus, colon and rectal (colorectal) cancers. 68 percent of all cancers diagnosed in women  and 24 percent of those diagnosed in men are associated with overweight and  obesity.  Recommendation: At this point it is urgent that you take a step back and concentrate in loosing weight. Dedicate 100% of your efforts on this task. Nothing else will improve your health more than bringing your weight down and your BMI to less than 30. If you are here, you probably have chronic pain. We know that most chronic pain patients have difficulty exercising secondary to their pain. For this reason, you must rely on proper nutrition and diet in order to lose the weight. If your BMI is above 40, you should seriously consider bariatric surgery. A realistic goal is to lose 10% of your body weight over a period of 12 months.  Be honest to yourself, if over time you have unsuccessfully tried to lose weight, then it is time for you to seek professional help and to enter a medically supervised weight management program, and/or undergo bariatric surgery. Stop procrastinating.   Pain management considerations:  1.    Pharmacological Problems: Be advised that the use of opioid analgesics (oxycodone; hydrocodone; morphine; methadone; codeine; and all of their derivatives) have been associated with decreased metabolism and weight gain.  For this reason, should we see that you are unable to lose weight while taking these medications, it may become necessary for [place] to taper down and indefinitely discontinue them.  2.    Technical Problems: The incidence of successful interventional therapies decreases as the patient's BMI increases. It is much more difficult to accomplish a safe and effective interventional therapy on a patient with a BMI above 35. 3.    Radiation Exposure Problems: The x-rays machine, used to accomplish injection therapies, will automatically increase their x-ray output in order to capture an appropriate bone image. This means that radiation exposure increases exponentially with the patient's BMI. (The higher the BMI, the higher the radiation exposure.) Although the level of radiation  used at a given time is still safe to the patient, it is not for the physician and/or assisting staff. Unfortunately, radiation exposure is accumulative. Because physicians and the staff have to do procedures and be exposed on a daily basis, this can result in health problems such as cancer and radiation burns. Radiation exposure to the staff is monitored by the radiation batches that they wear. The exposure levels are reported back to the staff on a quarterly basis. Depending on levels of exposure, physicians and staff may be obligated by law to decrease this exposure. This means that they have the right and obligation to refuse providing therapies where they may be overexposed to radiation. For this reason, physicians may decline to offer therapies such as radiofrequency ablation or implants to patients with a BMI above 40. 4.    Current Trends: Be advised that the current trend is to no longer offer certain therapies to patients with a BMI equal to, or above 35, due to increase perioperative risks, increased technical procedural difficulties, and excessive radiation exposure to healthcare personnel.  _________________________________________________________________________________________   ____________________________________________________________________________________________  Opioid Pain Medication Update  To: All patients taking opioid pain medications. (I.e.: hydrocodone, hydromorphone, oxycodone, oxymorphone, morphine, codeine, methadone, tapentadol, tramadol, buprenorphine, fentanyl, etc.)  Re: Updated review of side effects and adverse reactions of opioid analgesics, as well as new information about long term effects of this class of medications.  Direct risks of long-term opioid therapy are not limited to opioid addiction and overdose. Potential medical risks include serious fractures, breathing problems  during sleep, hyperalgesia, immunosuppression, chronic constipation, bowel  obstruction, myocardial infarction, and tooth decay secondary to xerostomia.  Unpredictable adverse effects that can occur even if you take your medication correctly: Cognitive impairment, respiratory depression, and death. Most people think that if they take their medication "correctly", and "as instructed", that they will be safe. Nothing could be farther from the truth. In reality, a significant amount of recorded deaths associated with the use of opioids has occurred in individuals that had taken the medication for a long time, and were taking their medication correctly. The following are examples of how this can happen: Patient taking his/her medication for a long time, as instructed, without any side effects, is given a certain antibiotic or another unrelated medication, which in turn triggers a "Drug-to-drug interaction" leading to disorientation, cognitive impairment, impaired reflexes, respiratory depression or an untoward event leading to serious bodily harm or injury, including death.  Patient taking his/her medication for a long time, as instructed, without any side effects, develops an acute impairment of liver and/or kidney function. This will lead to a rapid inability of the body to breakdown and eliminate their pain medication, which will result in effects similar to an "overdose", but with the same medicine and dose that they had always taken. This again may lead to disorientation, cognitive impairment, impaired reflexes, respiratory depression or an untoward event leading to serious bodily harm or injury, including death.  A similar problem will occur with patients as they grow older and their liver and kidney function begins to decrease as part of the aging process.  Background information: Historically, the original case for using long-term opioid therapy to treat chronic noncancer pain was based on safety assumptions that subsequent experience has called into question. In 1996, the  American Pain Society and the [place] Academy of Pain Medicine issued a consensus statement supporting long-term opioid therapy. This statement acknowledged the dangers of opioid prescribing but concluded that the risk for addiction was low; respiratory depression induced by opioids was short-lived, occurred mainly in opioid-naive patients, and was antagonized by pain; tolerance was not a common problem; and efforts to control diversion should not constrain opioid prescribing. This has now proven to be wrong. Experience regarding the risks for opioid addiction, misuse, and overdose in community practice has failed to support these assumptions.  According to the Centers for Disease Control and Prevention, fatal overdoses involving opioid analgesics have increased sharply over the past decade. Currently, more than 96,700 people die from drug overdoses every year. Opioids are a factor in 7 out of every 10 overdose deaths. Deaths from drug overdose have surpassed motor vehicle accidents as the leading cause of death for individuals between the ages of 2 and 42.  Clinical data suggest that neuroendocrine dysfunction may be very common in both men and women, potentially causing hypogonadism, erectile dysfunction, infertility, decreased libido, osteoporosis, and depression. Recent studies linked higher opioid dose to increased opioid-related mortality. Controlled observational studies reported that long-term opioid therapy may be associated with increased risk for cardiovascular events. Subsequent meta-analysis concluded that the safety of long-term opioid therapy in elderly patients has not been proven.   Side Effects and adverse reactions: Common side effects: Drowsiness (sedation). Dizziness. Nausea and vomiting. Constipation. Physical dependence -- Dependence often manifests with withdrawal symptoms when opioids are discontinued or decreased. Tolerance -- As you take repeated doses of opioids, you  require increased medication to experience the same effect of pain relief. Respiratory depression -- This can occur in healthy people,  especially with higher doses. However, people with COPD, asthma or other lung conditions may be even more susceptible to fatal respiratory impairment.  Uncommon side effects: An increased sensitivity to feeling pain and extreme response to pain (hyperalgesia). Chronic use of opioids can lead to this. Delayed gastric emptying (the process by which the contents of your stomach are moved into your small intestine). Muscle rigidity. Immune system and hormonal dysfunction. Quick, involuntary muscle jerks (myoclonus). Arrhythmia. Itchy skin (pruritus). Dry mouth (xerostomia).  Long-term side effects: Chronic constipation. Sleep-disordered breathing (SDB). Increased risk of bone fractures. Hypothalamic-pituitary-adrenal dysregulation. Increased risk of overdose.  RISKS: Fractures and Falls:  Opioids increase the risk and incidence of falls. This is of particular importance in elderly patients.  Endocrine System:  Long-term administration is associated with endocrine abnormalities. Influences on both the hypothalamic-pituitary-adrenal axis?and the hypothalamic-pituitary-gonadal axis have been demonstrated with consequent hypogonadism and adrenal insufficiency in both sexes. Hypogonadism and decreased levels of dehydroepiandrosterone sulfate have been reported in men and women. Endocrine effects can lead to: Amenorrhoea in women Reduced libido in both sexes Erectile dysfunction in men Infertility Depression and fatigue Patients (particularly women of childbearing age) should avoid opioids. There is insufficient evidence to recommend routine monitoring of asymptomatic patients taking opioids in the long-term for hormonal deficiencies.  Immune System: Human studies have demonstrated that opioids have an immunomodulating effect. These effects are mediated  via opioid receptors both on immune effector cells and in the central nervous system. Opioids have been demonstrated to have adverse effects on antimicrobial response and anti-tumour surveillance. Buprenorphine has been demonstrated to have no impact on immune function.  Opioid Induced Hyperalgesia: Human studies have demonstrated that prolonged use of opioids can lead to a state of abnormal pain sensitivity, sometimes called opioid induced hyperalgesia (OIH). Opioid induced hyperalgesia is not usually seen in the absence of tolerance to opioid analgesia. Clinically, hyperalgesia may be diagnosed if the patient on long-term opioid therapy presents with increased pain. This might be qualitatively and anatomically distinct from pain related to disease progression or to breakthrough pain resulting from development of opioid tolerance. Pain associated with hyperalgesia tends to be more diffuse than the pre-existing pain and less defined in quality. Management of opioid induced hyperalgesia requires opioid dose reduction.  Cancer: Chronic opioid therapy has been associated with an increased risk of cancer among noncancer patients with chronic pain. This association was more evident in chronic strong opioid users. Chronic opioid consumption causes significant pathological changes in the small intestine and colon. Epidemiological studies have found that there is a link between opium dependence and initiation of gastrointestinal cancers. Cancer is the second leading cause of death after cardiovascular disease. Chronic use of opioids can cause multiple conditions such as GERD, immunosuppression and renal damage as well as carcinogenic effects, which are associated with the incidence of cancers.   Mortality: Long-term opioid use has been associated with increased mortality among patients with chronic non-cancer pain (CNCP).  Prescription of long-acting opioids for chronic noncancer pain was associated with a  significantly increased risk of all-cause mortality, including deaths from causes other than overdose.  Reference: Von Korff M, Kolodny A, Deyo RA, Chou R. Long-term opioid therapy reconsidered. Ann Intern Med. 2011 Sep 6;155(5):325-8. doi: 10.7326/[id number]. PMID: [id number]; PMCIDXX:[id number]. Morley Kos, Hayward RA, Dunn KM, [place] KP. Risk of adverse events in patients prescribed long-term opioids: A cohort study in the [place] Clinical Practice Research [employer]. Eur J Pain. 2019 May;23(5):908-922. doi: 10.1002/ejp.1357. Epub 2019 Jan 31.  PMID: [id number]. Colameco S, Coren JS, Ciervo CA. Continuous opioid treatment for chronic noncancer pain: a time for moderation in prescribing. Postgrad Med. 2009 Jul;121(4):61-6. doi: 10.3810/pgm.2009.07.2032. PMID: [id number]. Heywood Bene RN, [place] SD, Blazina I, Rosalio Loud, Bougatsos C, Deyo RA. The effectiveness and risks of long-term opioid therapy for chronic pain: a systematic review for a [employer] of Health Pathways to [employer]. Ann Intern Med. 2015 Feb 17;162(4):276-86. doi: [id number]. PMID: [id number]. [facility], [place] DM. NCHS Data Brief No. 22. [place]: Centers for Disease Control and Prevention; 2009. Sep, Increase in Fatal Poisonings Involving Opioid Analgesics in the [place], 1999-2006. Song IA, Choi HR, Oh TK. Long-term opioid use and mortality in patients with chronic non-cancer pain: Ten-year follow-up study in [place] from 2010 through 2019. EClinicalMedicine. 2022 Jul 18;51:101558. doi: 10.1016/j.[id number]. PMID: [id number]; PMCIDOX:8550940. Huser, W., Schubert, T., Vogelmann, T. et al. All-cause mortality in patients with long-term opioid therapy compared with non-opioid analgesics for chronic non-cancer pain: a database study. [place] Med 18, 162 (2020). [email or web address] Rashidian H, Roxy Cedar,  Malekzadeh R, Haghdoost AA. An Ecological Study of the Association between Opiate Use and Incidence of Cancers. Addict Health. 2016 Fall;8(4):252-260. PMID: [id number]; PMCIDQI:9185013.  Our Goal: Our goal is to control your pain with means other than the use of opioid pain medications.  Our Recommendation: Talk to your physician about coming off of these medications. We can assist you with the tapering down and stopping these medicines. Based on the new information, even if you cannot completely stop the medication, a decrease in the dose may be associated with a lesser risk. Ask for other means of controlling the pain. Decrease or eliminate those factors that significantly contribute to your pain such as smoking, obesity, and a diet heavily tilted towards "inflammatory" nutrients.  ____________________________________________________________________________________________     ____________________________________________________________________________________________  Patient Information update  To: All of our patients.  Re: Name change.  It has been made official that our current name, "[facility]"   will soon be changed to "[facility]".   The purpose of this change is to eliminate any confusion created by the concept of our practice being a "Medication Management Pain Clinic". In the past this has led to the misconception that we treat pain primarily by the use of prescription medications.  Nothing can be farther from the truth.   Understanding PAIN MANAGEMENT: To further understand what our practice does, you first have to understand that "Pain Management" is a subspecialty that requires additional training once a physician has completed their specialty training, which can be in either Anesthesia, Neurology, Psychiatry, or Physical Medicine and Rehabilitation (PMR). Each one  of these contributes to the final approach taken by each physician to the management of their patient's pain. To be a "Pain Management Specialist" you must have first completed one of the specialty trainings below.  Anesthesiologists - trained in clinical pharmacology and interventional techniques such as nerve blockade and regional as well as central neuroanatomy. They are trained to block pain before, during, and after surgical interventions.  Neurologists - trained in the diagnosis and pharmacological treatment of complex neurological conditions, such as Multiple Sclerosis, Parkinson's, spinal cord injuries, and other systemic conditions that may be associated with symptoms that may include but are not limited to pain. They tend to rely primarily on the treatment of chronic pain using prescription medications.  Psychiatrist -  trained in conditions affecting the psychosocial wellbeing of patients including but not limited to depression, anxiety, schizophrenia, personality disorders, addiction, and other substance use disorders that may be associated with chronic pain. They tend to rely primarily on the treatment of chronic pain using prescription medications.   Physical Medicine and Rehabilitation (PMR) physicians, also known as physiatrists - trained to treat a wide variety of medical conditions affecting the brain, spinal cord, nerves, bones, joints, ligaments, muscles, and tendons. Their training is primarily aimed at treating patients that have suffered injuries that have caused severe physical impairment. Their training is primarily aimed at the physical therapy and rehabilitation of those patients. They may also work alongside orthopedic surgeons or neurosurgeons using their expertise in assisting surgical patients to recover after their surgeries.  INTERVENTIONAL PAIN MANAGEMENT is sub-subspecialty of Pain Management.  Our physicians are Board-certified in Anesthesia, Pain Management, and  Interventional Pain Management.  This meaning that not only have they been trained and Board-certified in their specialty of Anesthesia, and subspecialty of Pain Management, but they have also received further training in the sub-subspecialty of Interventional Pain Management, in order to become Board-certified as INTERVENTIONAL PAIN MANAGEMENT SPECIALIST.    Mission: Our goal is to use our skills in  [place] as alternatives to the chronic use of prescription opioid medications for the treatment of pain. To make this more clear, we have changed our name to reflect what we do and offer. We will continue to offer medication management assessment and recommendations, but we will not be taking over any patient's medication management.  ____________________________________________________________________________________________     ____________________________________________________________________________________________  National Pain Medication Shortage  The [place] is experiencing worsening drug shortages. These have had a negative widespread effect on patient care and treatment. Not expected to improve any time soon. Predicted to last past 2029.   Drug shortage list (generic names) Oxycodone IR Oxycodone/APAP Oxymorphone IR Hydromorphone Hydrocodone/APAP Morphine  Where is the problem?  Manufacturing and supply level.  Will this shortage affect you?  Only if you take any of the above pain medications.  How? You may be unable to fill your prescription.  Your pharmacist may offer a "partial fill" of your prescription. (Warning: Do not accept partial fills.) Prescriptions partially filled cannot be transferred to another pharmacy. Read our Medication Rules and Regulation. Depending on how much medicine you are dependent on, you may experience withdrawals when unable to get the medication.  Recommendations: Consider ending your dependence on opioid pain medications.  Ask your pain specialist to assist you with the process. Consider switching to a medication currently not in shortage, such as Buprenorphine. Talk to your pain specialist about this option. Consider decreasing your pain medication requirements by managing tolerance thru "Drug Holidays". This may help minimize withdrawals, should you run out of medicine. Control your pain thru the use of non-pharmacological interventional therapies.   Your prescriber: Prescribers cannot be blamed for shortages. Medication manufacturing and supply issues cannot be fixed by the prescriber.   NOTE: The prescriber is not responsible for supplying the medication, or solving supply issues. Work with your pharmacist to solve it. The patient is responsible for the decision to take or continue taking the medication and for identifying and securing a legal supply source. By law, supplying the medication is the job and responsibility of the pharmacy. The prescriber is responsible for the evaluation, monitoring, and prescribing of these medications.   Prescribers will NOT: Re-issue prescriptions that have been partially filled. Re-issue prescriptions already  sent to a pharmacy.  Re-send prescriptions to a different pharmacy because yours did not have your medication. Ask pharmacist to order more medicine or transfer the prescription to another pharmacy. (Read below.)  New 2023 regulation: "September 09, 2021 Revised Regulation Allows DEA-Registered Pharmacies to Transfer Electronic Prescriptions at a Patient's Request [place] Patients now have the ability to request their electronic prescription be transferred to another pharmacy without having to go back to their practitioner to initiate the request. This revised regulation went into effect on Monday, September 05, 2021.     At a patient's request, a DEA-registered retail pharmacy can now transfer an electronic prescription for a  controlled substance (schedules II-V) to another DEA-registered retail pharmacy. Prior to this change, patients would have to go through their practitioner to cancel their prescription and have it re-issued to a different pharmacy. The process was taxing and time consuming for both patients and practitioners.    The Drug Enforcement Administration [facility]) published its intent to revise the process for transferring electronic prescriptions on November 28, 2019.  The final rule was published in the federal register on August 04, 2021 and went into effect 30 days later.  Under the final rule, a prescription can only be transferred once between pharmacies, and only if allowed under existing state or other applicable law. The prescription must remain in its electronic form; may not be altered in any way; and the transfer must be communicated directly between two licensed pharmacists. It's important to note, any authorized refills transfer with the original prescription, which means the entire prescription will be filled at the same pharmacy".  Reference: [email or web address] [facility] website announcement)  [email or web address] ([place])   [employer] / Vol. 88, No. 143 / Thursday, August 04, 2021 / Rules and Regulations DEPARTMENT OF JUSTICE  Drug Enforcement Administration  21 CFR Part 1306  [Docket No. [id number]]  RIN [id number] Transfer of Electronic Prescriptions for Schedules II-V Controlled Substances Between Pharmacies for Initial Filling  ____________________________________________________________________________________________     _______________________________________________________________________  Medication Rules  Purpose: To inform patients, and their family members, of our medication rules and  regulations.  Applies to: All patients receiving prescriptions from our practice (written or electronic).  Pharmacy of record: This is the pharmacy where your electronic prescriptions will be sent. Make sure we have the correct one.  Electronic prescriptions: In compliance with the [place] (STOP) Act of 2017 (Session Lanny Cramp [phone]), effective January 09, 2018, all controlled substances must be electronically prescribed. Written prescriptions, faxing, or calling prescriptions to a pharmacy will no longer be done.  Prescription refills: These will be provided only during in-person appointments. No medications will be renewed without a "face-to-face" evaluation with your provider. Applies to all prescriptions.  NOTE: The following applies primarily to controlled substances (Opioid* Pain Medications).   Type of encounter (visit): For patients receiving controlled substances, face-to-face visits are required. (Not an option and not up to the patient.)  Patient's responsibilities: Pain Pills: Bring all pain pills to every appointment (except for procedure appointments). Pill Bottles: Bring pills in original pharmacy bottle. Bring bottle, even if empty. Always bring the bottle of the most recent fill.  Medication refills: You are responsible for knowing and keeping track of what medications you are taking and when is it that you will need a refill. The day before your appointment: write a list of all prescriptions that need to be refilled. The day  of the appointment: give the list to the admitting nurse. Prescriptions will be written only during appointments. No prescriptions will be written on procedure days. If you forget a medication: it will not be "Called in", "Faxed", or "electronically sent". You will need to get another appointment to get these prescribed. No early refills. Do not call asking to have your prescription filled early. Partial  or short  prescriptions: Occasionally your pharmacy may not have enough pills to fill your prescription.  NEVER ACCEPT a partial fill or a prescription that is short of the total amount of pills that you were prescribed.  With controlled substances the law allows 72 hours for the pharmacy to complete the prescription.  If the prescription is not completed within 72 hours, the pharmacist will require a new prescription to be written. This means that you will be short on your medicine and we WILL NOT send another prescription to complete your original prescription.  Instead, request the pharmacy to send a carrier to a nearby branch to get enough medication to provide you with your full prescription. Prescription Accuracy: You are responsible for carefully inspecting your prescriptions before leaving our office. Have the discharge nurse carefully go over each prescription with you, before taking them home. Make sure that your name is accurately spelled, that your address is correct. Check the name and dose of your medication to make sure it is accurate. Check the number of pills, and the written instructions to make sure they are clear and accurate. Make sure that you are given enough medication to last until your next medication refill appointment. Taking Medication: Take medication as prescribed. When it comes to controlled substances, taking less pills or less frequently than prescribed is permitted and encouraged. Never take more pills than instructed. Never take the medication more frequently than prescribed.  Inform other Doctors: Always inform, all of your healthcare providers, of all the medications you take. Pain Medication from other Providers: You are not allowed to accept any additional pain medication from any other Doctor or Healthcare provider. There are two exceptions to this rule. (see below) In the event that you require additional pain medication, you are responsible for notifying us, as stated  below. Cough Medicine: Often these contain an opioid, such as codeine or hydrocodone. Never accept or take cough medicine containing these opioids if you are already taking an opioid* medication. The combination may cause respiratory failure and death. Medication Agreement: You are responsible for carefully reading and following our Medication Agreement. This must be signed before receiving any prescriptions from our practice. Safely store a copy of your signed Agreement. Violations to the Agreement will result in no further prescriptions. (Additional copies of our Medication Agreement are available upon request.) Laws, Rules, & Regulations: All patients are expected to follow all Federal and [employer], [employer], Rules, [employer]. Ignorance of the Laws does not constitute a valid excuse.  Illegal drugs and Controlled Substances: The use of illegal substances (including, but not limited to marijuana and its derivatives) and/or the illegal use of any controlled substances is strictly prohibited. Violation of this rule may result in the immediate and permanent discontinuation of any and all prescriptions being written by our practice. The use of any illegal substances is prohibited. Adopted CDC guidelines & recommendations: Target dosing levels will be at or below 60 MME/day. Use of benzodiazepines** is not recommended.  Exceptions: There are only two exceptions to the rule of not receiving pain medications from other Healthcare Providers. Exception #  1 (Emergencies): In the event of an emergency (i.e.: accident requiring emergency care), you are allowed to receive additional pain medication. However, you are responsible for: As soon as you are able, call our office [phone], at any time of the day or night, and leave a message stating your name, the date and nature of the emergency, and the name and dose of the medication prescribed. In the event that your call is answered by a member of our staff,  make sure to document and save the date, time, and the name of the person that took your information.  Exception #2 (Planned Surgery): In the event that you are scheduled by another doctor or dentist to have any type of surgery or procedure, you are allowed (for a period no longer than 30 days), to receive additional pain medication, for the acute post-op pain. However, in this case, you are responsible for picking up a copy of our "Post-op Pain Management for Surgeons" handout, and giving it to your surgeon or dentist. This document is available at our office, and does not require an appointment to obtain it. Simply go to our office during business hours (Monday-Thursday from 8:00 AM to 4:00 PM) (Friday 8:00 AM to 12:00 Noon) or if you have a scheduled appointment with [place], prior to your surgery, and ask for it by name. In addition, you are responsible for: calling our office [phone], at any time of the day or night, and leaving a message stating your name, name of your surgeon, type of surgery, and date of procedure or surgery. Failure to comply with your responsibilities may result in termination of therapy involving the controlled substances. Medication Agreement Violation. Following the above rules, including your responsibilities will help you in avoiding a Medication Agreement Violation ("Breaking your Pain Medication Contract").  Consequences:  Not following the above rules may result in permanent discontinuation of medication prescription therapy.  *Opioid medications include: morphine, codeine, oxycodone, oxymorphone, hydrocodone, hydromorphone, meperidine, tramadol, tapentadol, buprenorphine, fentanyl, methadone. **Benzodiazepine medications include: diazepam (Valium), alprazolam (Xanax), clonazepam (Klonopine), lorazepam (Ativan), clorazepate (Tranxene), chlordiazepoxide (Librium), estazolam (Prosom), oxazepam (Serax), temazepam (Restoril), triazolam (Halcion) (Last updated:  11/01/2021) ______________________________________________________________________    ______________________________________________________________________  Medication Recommendations and Reminders  Applies to: All patients receiving prescriptions (written and/or electronic).  Medication Rules & Regulations: You are responsible for reading, knowing, and following our "Medication Rules" document. These exist for your safety and that of others. They are not flexible and neither are we. Dismissing or ignoring them is an act of "non-compliance" that may result in complete and irreversible termination of such medication therapy. For safety reasons, "non-compliance" will not be tolerated. As with the [place] fundamental legal principle of "ignorance of the law is no defense", we will accept no excuses for not having read and knowing the content of documents provided to you by our practice.  Pharmacy of record:  Definition: This is the pharmacy where your electronic prescriptions will be sent.  We do not endorse any particular pharmacy. It is up to you and your insurance to decide what pharmacy to use.  We do not restrict you in your choice of pharmacy. However, once we write for your prescriptions, we will NOT be re-sending more prescriptions to fix restricted supply problems created by your pharmacy, or your insurance.  The pharmacy listed in the electronic medical record should be the one where you want electronic prescriptions to be sent. If you choose to change pharmacy, simply notify our nursing staff. Changes will  be made only during your regular appointments and not over the phone.  Recommendations: Keep all of your pain medications in a safe place, under lock and key, even if you live alone. We will NOT replace lost, stolen, or damaged medication. We do not accept "Police Reports" as proof of medications having been stolen. After you fill your prescription, take 1 week's worth of pills and put  them away in a safe place. You should keep a separate, properly labeled bottle for this purpose. The remainder should be kept in the original bottle. Use this as your primary supply, until it runs out. Once it's gone, then you know that you have 1 week's worth of medicine, and it is time to come in for a prescription refill. If you do this correctly, it is unlikely that you will ever run out of medicine. To make sure that the above recommendation works, it is very important that you make sure your medication refill appointments are scheduled at least 1 week before you run out of medicine. To do this in an effective manner, make sure that you do not leave the office without scheduling your next medication management appointment. Always ask the nursing staff to show you in your prescription , when your medication will be running out. Then arrange for the receptionist to get you a return appointment, at least 7 days before you run out of medicine. Do not wait until you have 1 or 2 pills left, to come in. This is very poor planning and does not take into consideration that we may need to cancel appointments due to bad weather, sickness, or emergencies affecting our staff. DO NOT ACCEPT A "Partial Fill": If for any reason your pharmacy does not have enough pills/tablets to completely fill or refill your prescription, do not allow for a "partial fill". The law allows the pharmacy to complete that prescription within 72 hours, without requiring a new prescription. If they do not fill the rest of your prescription within those 72 hours, you will need a separate prescription to fill the remaining amount, which we will NOT provide. If the reason for the partial fill is your insurance, you will need to talk to the pharmacist about payment alternatives for the remaining tablets, but again, DO NOT ACCEPT A PARTIAL FILL, unless you can trust your pharmacist to obtain the remainder of the pills within 72 hours.  Prescription  refills and/or changes in medication(s):  Prescription refills, and/or changes in dose or medication, will be conducted only during scheduled medication management appointments. (Applies to both, written and electronic prescriptions.) No refills on procedure days. No medication will be changed or started on procedure days. No changes, adjustments, and/or refills will be conducted on a procedure day. Doing so will interfere with the diagnostic portion of the procedure. No phone refills. No medications will be "called into the pharmacy". No Fax refills. No weekend refills. No Holliday refills. No after hours refills.  Remember:  Business hours are:  Monday to Thursday 8:00 AM to 4:00 PM Provider's Schedule: Milinda Pointer, MD - Appointments are:  Medication management: Monday and Wednesday 8:00 AM to 4:00 PM Procedure day: Tuesday and Thursday 7:30 AM to 4:00 PM Gillis Santa, MD - Appointments are:  Medication management: Tuesday and Thursday 8:00 AM to 4:00 PM Procedure day: Monday and Wednesday 7:30 AM to 4:00 PM (Last update: 11/01/2021) ______________________________________________________________________    ____________________________________________________________________________________________  Drug Holidays  What is a "Drug Holiday"? Drug Holiday: is the name given to  the process of slowly tapering down and temporarily stopping the pain medication for the purpose of decreasing or eliminating tolerance to the drug.  Benefits Improved effectiveness Decreased required effective dose Improved pain control End dependence on high dose therapy Decrease cost of therapy Uncovering "opioid-induced hyperalgesia". (OIH)  What is "opioid hyperalgesia"? It is a paradoxical increase in pain caused by exposure to opioids. Stopping the opioid pain medication, contrary to the expected, it actually decreases or completely eliminates the pain. Ref.: "A comprehensive review of  opioid-induced hyperalgesia". Brion Aliment, et.al. Pain Physician. 2011 Mar-Apr;14(2):145-61.  What is tolerance? Tolerance: the progressive loss of effectiveness of a pain medicine due to repetitive use. A common problem of opioid pain medications.  How long should a "Drug Holiday" last? Effectiveness depends on the patient staying off all opioid pain medicines for a minimum of 14 consecutive days. (2 weeks)  How about just taking less of the medicine? Does not work. Will not accomplish goal of eliminating the excess receptors.  How about switching to a different pain medicine? (AKA. "Opioid rotation") Does not work. Creates the illusion of effectiveness by taking advantage of inaccurate equivalent dose calculations between different opioids. -This "technique" was promoted by studies funded by [employer], such as [employer], creators of "OxyContin".  Can I stop the medicine "cold [place]"? Depends. You should always coordinate with your Pain Specialist to make the transition as smoothly as possible. Avoid stopping the medicine abruptly without consulting. We recommend a "slow taper".  What is a slow taper? Taper: refers to the gradual decrease in dose.   How do I stop/taper the dose? Slowly. Decrease the daily amount of pills that you take by one (1) pill every seven (7) days. This is called a "slow downward taper". Example: if you normally take four (4) pills per day, drop it to three (3) pills per day for seven (7) days, then to two (2) pills per day for seven (7) days, then to one (1) per day for seven (7) days, and then stop the medicine. The 14 day "Drug Holiday" starts on the first day without medicine.   Will I experience withdrawals? Unlikely with a slow taper.  What triggers withdrawals? Withdrawals are triggered by the sudden/abrupt stop of high dose opioids. Withdrawals can be avoided by slowly decreasing the dose over a prolonged period of time.  What are  withdrawals? Symptoms associated with sudden/abrupt reduction/stopping of high-dose, long-term use of pain medication. Withdrawal are seldom seen on low dose therapy, or patients rarely taking opioid medication.  Early Withdrawal Symptoms may include: Agitation Anxiety Muscle aches Increased tearing Insomnia Runny nose Sweating Yawning  Late symptoms may include: Abdominal cramping Diarrhea Dilated pupils Goose bumps Nausea Vomiting  (Last update: 12/18/2021) ____________________________________________________________________________________________    ____________________________________________________________________________________________  WARNING: CBD (cannabidiol) & Delta (Delta-8 tetrahydrocannabinol) products.   Applicable to:  All individuals currently taking or considering taking CBD (cannabidiol) and, more important, all patients taking opioid analgesic controlled substances (pain medication). (Example: oxycodone; oxymorphone; hydrocodone; hydromorphone; morphine; methadone; tramadol; tapentadol; fentanyl; buprenorphine; butorphanol; dextromethorphan; meperidine; codeine; etc.)  Introduction:  Recently there has been a drive towards the use of "natural" products for the treatment of different conditions, including pain anxiety and sleep disorders. Marijuana and hemp are two varieties of the cannabis genus plants. Marijuana and its derivatives are illegal, while hemp and its derivatives are not. Cannabidiol (CBD) and tetrahydrocannabinol (THC), are two natural compounds found in plants of the Cannabis genus. They can both be extracted from hemp  or marijuana. Both compounds interact with your body's endocannabinoid system in very different ways. CBD is associated with pain relief (analgesia) while THC is associated with the psychoactive effects ("the high") obtained from the use of marijuana products. There are two main types of THC: Delta-9, which comes from the marijuana  plant and it is illegal, and Delta-8, which comes from the hemp plant, and it is legal. (Both, Delta-9-THC and Delta-8-THC are psychoactive and give you "the high".)   Legality:  Marijuana and its derivatives: illegal Hemp and its derivatives: Legal (State dependent) UPDATE: (02/25/2021) The Drug Enforcement Agency ([place]) issued a letter stating that "delta" cannabinoids, including Delta-8-THCO and Delta-9-THCO, synthetically derived from hemp do not qualify as hemp and will be viewed as Schedule I drugs. (Schedule I drugs, substances, or chemicals are defined as drugs with no currently accepted medical use and a high potential for abuse. Some examples of Schedule I drugs are: heroin, lysergic acid diethylamide (LSD), marijuana (cannabis), 3,4-methylenedioxymethamphetamine (ecstasy), methaqualone, and peyote.) (https://jennings.com/)  Legal status of CBD in [place]:  "Conditionally Legal"  Reference: "FDA Regulation of Cannabis and Cannabis-Derived Products, Including Cannabidiol (CBD)" - [employer].com.pt  Warning:  CBD is not FDA approved and has not undergo the same manufacturing controls as prescription drugs.  This means that the purity and safety of available CBD may be questionable. Most of the time, despite manufacturer's claims, it is contaminated with THC (delta-9-tetrahydrocannabinol - the chemical in marijuana responsible for the "HIGH").  When this is the case, the [facility] contaminant will trigger a positive urine drug screen (UDS) test for Marijuana (carboxy-THC).   The FDA recently put out a warning about 5 things that everyone should be aware of regarding Delta-8 THC: Delta-8 THC products have not been evaluated or approved by the FDA for safe use and may be marketed in ways that put the public health at risk. The FDA has received adverse event reports involving delta-8  THC-containing products. Delta-8 THC has psychoactive and intoxicating effects. Delta-8 THC manufacturing often involve use of potentially harmful chemicals to create the concentrations of delta-8 THC claimed in the marketplace. The final delta-8 THC product may have potentially harmful by-products (contaminants) due to the chemicals used in the process. Manufacturing of delta-8 THC products may occur in uncontrolled or unsanitary settings, which may lead to the presence of unsafe contaminants or other potentially harmful substances. Delta-8 THC products should be kept out of the reach of children and pets.  NOTE: Because a positive UDS for any illicit substance is a violation of our medication agreement, your opioid analgesics (pain medicine) may be permanently discontinued.  MORE ABOUT CBD  General Information: CBD was discovered in 2 and it is a derivative of the cannabis sativa genus plants (Marijuana and Hemp). It is one of the 113 identified substances found in Marijuana. It accounts for up to 40% of the plant's extract. As of 2018, preliminary clinical studies on CBD included research for the treatment of anxiety, movement disorders, and pain. CBD is available and consumed in multiple forms, including inhalation of smoke or vapor, as an aerosol spray, and by mouth. It may be supplied as an oil containing CBD, capsules, dried cannabis, or as a liquid solution. CBD is thought not to be as psychoactive as THC (delta-9-tetrahydrocannabinol - the chemical in marijuana responsible for the "HIGH"). Studies suggest that CBD may interact with different biological target receptors in the body, including cannabinoid and other neurotransmitter receptors. As of 2018 the mechanism of action for its  biological effects has not been determined.  Side-effects  Adverse reactions: Dry mouth, diarrhea, decreased appetite, fatigue, drowsiness, malaise, weakness, sleep disturbances, and others.  Drug interactions:   CBD may interact with medications such as blood-thinners. CBD causes drowsiness on its own and it will increase drowsiness caused by other medications, including antihistamines (such as Benadryl), benzodiazepines (Xanax, Ativan, Valium), antipsychotics, antidepressants, opioids, alcohol and supplements such as kava, melatonin and St. John's Wort.  Other drug interactions: Brivaracetam (Briviact); Caffeine; Carbamazepine (Tegretol); Citalopram (Celexa); Clobazam (Onfi); Eslicarbazepine (Aptiom); Everolimus (Zostress); Lithium; Methadone (Dolophine); Rufinamide (Banzel); Sedative medications (CNS depressants); Sirolimus (Rapamune); Stiripentol (Diacomit); Tacrolimus (Prograf); Tamoxifen ; Soltamox); Topiramate (Topamax); Valproate; Warfarin (Coumadin); Zonisamide. (Last update: 12/19/2021) ____________________________________________________________________________________________   ____________________________________________________________________________________________  Naloxone Nasal Spray  Why am I receiving this medication? [place] STOP ACT requires that all patients taking high dose opioids or at risk of opioids respiratory depression, be prescribed an opioid reversal agent, such as Naloxone (AKA: Narcan).  What is this medication? NALOXONE (nal OX one) treats opioid overdose, which causes slow or shallow breathing, severe drowsiness, or trouble staying awake. Call emergency services after using this medication. You may need additional treatment. Naloxone works by reversing the effects of opioids. It belongs to a group of medications called opioid blockers.  COMMON BRAND NAME(S): Kloxxado, Narcan  What should I tell my care team before I take this medication? They need to know if you have any of these conditions: Heart disease Substance use disorder An unusual or allergic reaction to naloxone, other medications, foods, dyes, or preservatives Pregnant or trying to get  pregnant Breast-feeding  When to use this medication? This medication is to be used for the treatment of respiratory depression (less than 8 breaths per minute) secondary to opioid overdose.   How to use this medication? This medication is for use in the nose. Lay the person on their back. Support their neck with your hand and allow the head to tilt back before giving the medication. The nasal spray should be given into 1 nostril. After giving the medication, move the person onto their side. Do not remove or test the nasal spray until ready to use. Get emergency medical help right away after giving the first dose of this medication, even if the person wakes up. You should be familiar with how to recognize the signs and symptoms of a narcotic overdose. If more doses are needed, give the additional dose in the other nostril. Talk to your care team about the use of this medication in children. While this medication may be prescribed for children as young as newborns for selected conditions, precautions do apply.  Naloxone Overdosage: If you think you have taken too much of this medicine contact a poison control center or emergency room at once.  NOTE: This medicine is only for you. Do not share this medicine with others.  What if I miss a dose? This does not apply.  What may interact with this medication? This is only used during an emergency. No interactions are expected during emergency use. This list may not describe all possible interactions. Give your health care provider a list of all the medicines, herbs, non-prescription drugs, or dietary supplements you use. Also tell them if you smoke, drink alcohol, or use illegal drugs. Some items may interact with your medicine.  What should I watch for while using this medication? Keep this medication ready for use in the case of an opioid overdose. Make sure that you have the phone number of  your care team and local hospital ready. You may need to  have additional doses of this medication. Each nasal spray contains a single dose. Some emergencies may require additional doses. After use, bring the treated person to the nearest hospital or call 911. Make sure the treating care team knows that the person has received a dose of this medication. You will receive additional instructions on what to do during and after use of this medication before an emergency occurs.  What side effects may I notice from receiving this medication? Side effects that you should report to your care team as soon as possible: Allergic reactions--skin rash, itching, hives, swelling of the face, lips, tongue, or throat Side effects that usually do not require medical attention (report these to your care team if they continue or are bothersome): Constipation Dryness or irritation inside the nose Headache Increase in blood pressure Muscle spasms Stuffy nose Toothache This list may not describe all possible side effects. Call your doctor for medical advice about side effects. You may report side effects to FDA at 1-800-FDA-1088.  Where should I keep my medication? Because this is an emergency medication, you should keep it with you at all times.  Keep out of the reach of children and pets. Store between 20 and 25 degrees C (68 and 77 degrees F). Do not freeze. Throw away any unused medication after the expiration date. Keep in original box until ready to use.  NOTE: This sheet is a summary. It may not cover all possible information. If you have questions about this medicine, talk to your doctor, pharmacist, or health care provider.   2023 Elsevier/Gold Standard (2020-09-03 00:00:00)  ____________________________________________________________________________________________

## 2022-03-08 NOTE — Progress Notes (Signed)
Nursing Pain Medication Assessment:  Safety precautions to be maintained throughout the outpatient stay will include: orient to surroundings, keep bed in low position, maintain call bell within reach at all times, provide assistance with transfer out of bed and ambulation.  Medication Inspection Compliance: Pill count conducted under aseptic conditions, in front of the patient. Neither the pills nor the bottle was removed from the patient's sight at any time. Once count was completed pills were immediately returned to the patient in their original bottle.  Medication: Morphine IR Pill/Patch Count:  14 of 90 pills remain Pill/Patch Appearance: Markings consistent with prescribed medication Bottle Appearance: Standard pharmacy container. Clearly labeled. Filled Date: 02 / 24 / 2024 Last Medication intake:  Today

## 2022-05-14 NOTE — Progress Notes (Unsigned)
PROVIDER NOTE: Information contained herein reflects review and annotations entered in association with encounter. Interpretation of such information and data should be left to medically-trained personnel. Information provided to patient can be located elsewhere in the medical record under "Patient Instructions". Document created using STT-dictation technology, any transcriptional errors that may result from process are unintentional.    Patient: Tiffany Herring  Service Category: E/M  Provider: Oswaldo Done, MD  DOB: April 04, 1960  DOS: 05/15/2022  Referring Provider: Rolm Gala, MD  MRN: [id number]  Specialty: Interventional Pain Management  PCP: Rolm Gala, MD  Type: Established Patient  Setting: Ambulatory outpatient    Location: Office  Delivery: Face-to-face     HPI  Tiffany Herring, a 62 y.o. year old female, is here today because of her No primary diagnosis found.. Tiffany Herring primary complain today is No chief complaint on file.  Pertinent problems: Tiffany Herring has Failed back surgical syndrome; Epidural fibrosis; Chronic low back pain (Bilateral) w/ sciatica (Right); Chronic lumbar radicular pain (Right); Generalized OA; Multiple sclerosis (HCC); Degenerative arthritis of hip; Fibromyalgia; Lumbar spondylosis; Grade 1 Anterolisthesis of L4/L5; Chronic sacroiliac joint pain (bilateral); Chronic hip pain (Left); Arthropathy of left hip (Severe chronic left hip DJD); Osteoarthritis of hip (Left); Lumbar facet arthropathy (HCC); Lumbar facet hypertrophy; Lumbar facet syndrome (Bilateral); Chronic flank pain (Right); Chronic lower extremity pain (Right); Chronic pain syndrome; Venous stasis ulcer (HCC); OA (osteoarthritis); Osteoarthrosis, hip; Generalized osteoarthritis of multiple sites; and DDD (degenerative disc disease), lumbosacral on their pertinent problem list. Pain Assessment: Severity of   is reported as a  /10. Location:    / . Onset:  . Quality:  . Timing:  . Modifying  factor(s):  Marland Kitchen Vitals:  vitals were not taken for this visit.  BMI: Estimated body mass index is 56.15 kg/m as calculated from the following:   Height as of 03/08/22: 5\' 2"  (1.575 m).   Weight as of 03/08/22: 307 lb (139.3 kg). Last encounter: 03/08/2022. Last procedure: Visit date not found.  Reason for encounter:  *** . ***  Pharmacotherapy Assessment  Analgesic: MSIR 15 mg, 1 tab PO TID. "Drug Holiday" completed on 02/23/2021.  Micah Flesher from MME of 110 mg/day to 45 mg/day). MME/day: 45 mg/day   Monitoring: [place] PMP: PDMP reviewed during this encounter.       Pharmacotherapy: No side-effects or adverse reactions reported. Compliance: No problems identified. Effectiveness: Clinically acceptable.  No notes on file  No results found for: "CBDTHCR" No results found for: "D8THCCBX" No results found for: "D9THCCBX"  UDS:  Summary  Date Value Ref Range Status  06/15/2021 Note  Final    Comment:    ==================================================================== [employer] Select 13 (MW) ==================================================================== Test                             Result       Flag       Units  Drug Present and Declared for Prescription Verification   Morphine                       11584        EXPECTED   ng/mg creat   Normorphine                    189          EXPECTED   ng/mg creat    Potential sources of large amounts of morphine  in the absence of    codeine include administration of morphine or use of heroin.     Normorphine is an expected metabolite of morphine.    Hydromorphone                  90           EXPECTED   ng/mg creat    Hydromorphone may be present as a metabolite of morphine;    concentrations of hydromorphone rarely exceed 5% of the morphine    concentration when this is the source of hydromorphone.  Drug Absent but Declared for Prescription Verification   Oxycodone                      Not Detected UNEXPECTED ng/mg  creat ==================================================================== Test                      Result    Flag   Units      Ref Range   Creatinine              79               mg/dL      >=11 ==================================================================== Declared Medications:  The flagging and interpretation on this report are based on the  following declared medications.  Unexpected results may arise from  inaccuracies in the declared medications.   **Note: The testing scope of this panel includes these medications:   Morphine (MSIR)  Oxycodone   **Note: The testing scope of this panel does not include the  following reported medications:   Albuterol (Ventolin HFA)  Aspirin  Bupropion (Wellbutrin)  Clotrimazole (Mycelex)  Cyanocobalamin  Enalapril (Vasotec)  Furosemide (Lasix)  Gabapentin (Neurontin)  Insulin (Novolin)  Mirtazapine (Remeron)  Montelukast (Singulair)  Naloxone (Narcan)  Oxybutynin (Ditropan)  Timolol (Timoptic)  Tizanidine (Zanaflex) ==================================================================== For clinical consultation, please call [phone]. ====================================================================       ROS  Constitutional: Denies any fever or chills Gastrointestinal: No reported hemesis, hematochezia, vomiting, or acute GI distress Musculoskeletal: Denies any acute onset joint swelling, redness, loss of ROM, or weakness Neurological: No reported episodes of acute onset apraxia, aphasia, dysarthria, agnosia, amnesia, paralysis, loss of coordination, or loss of consciousness  Medication Review  BLOOD GLUCOSE TEST STRIPS, Cholecalciferol, Fifty50 Glucose Meter 2.0, Insulin Syringe-Needle U-100, accu-chek multiclix, albuterol, aspirin, buPROPion, clotrimazole, cyanocobalamin, dapagliflozin propanediol, enalapril, furosemide, gabapentin, glucose blood, insulin NPH-regular Human, insulin regular, latanoprost,  mirtazapine, montelukast, morphine, oxybutynin, tiZANidine, and timolol  History Review  Allergy: Tiffany Herring is allergic to amoxicillin-pot clavulanate, copaxone  [glatiramer acetate], pseudoephedrine, rofecoxib, sulfa antibiotics, and meloxicam. Drug: Tiffany Herring  reports no history of drug use. Alcohol:  reports no history of alcohol use. Tobacco:  reports that she has quit smoking. She has never used smokeless tobacco. Social: Tiffany Herring  reports that she has quit smoking. She has never used smokeless tobacco. She reports that she does not drink alcohol and does not use drugs. Medical:  has a past medical history of Allergy, Arthritis, Asthma, Chronic kidney disease, Depression, Diabetes mellitus without complication (HCC), Hypertension, Multiple sclerosis (HCC), Neuromuscular disorder (HCC), and Sleep apnea. Surgical: Tiffany Herring  has a past surgical history that includes Cesarean section (1991 and 1996). Family: family history includes Dementia in her mother; Depression in her mother; Diabetes in her mother; Hyperlipidemia in her mother; Stroke in her father.  Laboratory Chemistry Profile   Renal Lab Results  Component Value Date  BUN 18 02/06/2018   CREATININE 1.22 (H) 02/06/2018   BCR 15 02/06/2018   GFRAA 56 (L) 02/06/2018   GFRNONAA 49 (L) 02/06/2018    Hepatic Lab Results  Component Value Date   AST 25 02/06/2018   ALT 19 02/15/2015   ALBUMIN 4.6 02/06/2018   ALKPHOS 97 02/06/2018    Electrolytes Lab Results  Component Value Date   NA 136 02/06/2018   K 4.9 02/06/2018   CL 95 (L) 02/06/2018   CALCIUM 9.8 02/06/2018   MG 1.8 02/06/2018    Bone Lab Results  Component Value Date   25OHVITD1 55 02/06/2018   25OHVITD2 2.9 02/06/2018   25OHVITD3 52 02/06/2018    Inflammation (CRP: Acute Phase) (ESR: Chronic Phase) Lab Results  Component Value Date   CRP 17 (H) 02/06/2018   ESRSEDRATE 28 02/06/2018         Note: Above Lab results reviewed.  Recent  Imaging Review  US RENAL CLINICAL DATA:  Chronic renal disease  EXAM: RENAL / URINARY TRACT ULTRASOUND COMPLETE  COMPARISON:  None Available.  FINDINGS: Right Kidney:  Renal measurements: 10.3 x 4.9 x 5.4 cm = volume: 143 mL. Diffuse increased echogenicity.  Left Kidney:  Renal measurements: 9.9 x 4.6 x 5.2 cm = volume: 125 mL. Diffuse increased echogenicity.  Bladder:  Appears normal for degree of bladder distention.  Other:  None.  IMPRESSION: Diffuse increased echogenicity in both kidneys is consistent with medical renal disease. No other abnormalities.  Electronically Signed   By: Gerome Sam III M.D.   On: 11/25/2021 14:49 Note: Reviewed        Physical Exam  General appearance: Well nourished, well developed, and well hydrated. In no apparent acute distress Mental status: Alert, oriented x 3 (person, place, & time)       Respiratory: No evidence of acute respiratory distress Eyes: PERLA Vitals: There were no vitals taken for this visit. BMI: Estimated body mass index is 56.15 kg/m as calculated from the following:   Height as of 03/08/22: 5\' 2"  (1.575 m).   Weight as of 03/08/22: 307 lb (139.3 kg). Ideal: Patient weight not recorded  Assessment   Diagnosis Status  No diagnosis found. Controlled Controlled Controlled   Updated Problems: No problems updated.  Plan of Care  Problem-specific:  No problem-specific Assessment & Plan notes found for this encounter.  Tiffany Herring has a current medication list which includes the following long-term medication(s): albuterol, bupropion, furosemide, gabapentin, insulin regular, mirtazapine, montelukast, morphine, morphine, and morphine.  Pharmacotherapy (Medications Ordered): No orders of the defined types were placed in this encounter.  Orders:  No orders of the defined types were placed in this encounter.  Follow-up plan:   No follow-ups on file.      Interventional Therapies  Risk  Factors  Considerations:   MO (Class 3, BMI>50)  T2IDDM  CKD (Stage 3)     Planned  Pending:      Under consideration:   None   Completed:   None   Therapeutic  Palliative (PRN) options:   None   Pharmacotherapy  "Drug Holiday" completed on 02/23/2021. Total daily dose brought down from 110 MME/day to 60. Oxycodone (ineffective).       Recent Visits Date Type Provider Dept  03/08/22 Office Visit Delano Metz, MD [facility]  Showing recent visits within past 90 days and meeting all other requirements Future Appointments Date Type Provider Dept  05/15/22 Appointment Delano Metz, MD [facility]  Showing future appointments within next 90 days and meeting all other requirements  I discussed the assessment and treatment plan with the patient. The patient was provided an opportunity to ask questions and all were answered. The patient agreed with the plan and demonstrated an understanding of the instructions.  Patient advised to call back or seek an in-person evaluation if the symptoms or condition worsens.  Duration of encounter: *** minutes.  Total time on encounter, as per AMA guidelines included both the face-to-face and non-face-to-face time personally spent by the physician and/or other qualified health care professional(s) on the day of the encounter (includes time in activities that require the physician or other qualified health care professional and does not include time in activities normally performed by clinical staff). Physician's time may include the following activities when performed: Preparing to see the patient (e.g., pre-charting review of records, searching for previously ordered imaging, lab work, and nerve conduction tests) Review of prior analgesic pharmacotherapies. Reviewing PMP Interpreting ordered tests (e.g., lab work, imaging, nerve conduction tests) Performing post-procedure evaluations, including interpretation  of diagnostic procedures Obtaining and/or reviewing separately obtained history Performing a medically appropriate examination and/or evaluation Counseling and educating the patient/family/caregiver Ordering medications, tests, or procedures Referring and communicating with other health care professionals (when not separately reported) Documenting clinical information in the electronic or other health record Independently interpreting results (not separately reported) and communicating results to the patient/ family/caregiver Care coordination (not separately reported)  Note by: Oswaldo Done, MD Date: 05/15/2022; Time: 3:55 PM

## 2022-05-15 ENCOUNTER — Ambulatory Visit: Payer: Medicare HMO | Attending: Pain Medicine | Admitting: Pain Medicine

## 2022-05-15 ENCOUNTER — Encounter: Payer: Self-pay | Admitting: Pain Medicine

## 2022-05-15 DIAGNOSIS — M159 Polyosteoarthritis, unspecified: Secondary | ICD-10-CM

## 2022-05-15 DIAGNOSIS — G894 Chronic pain syndrome: Secondary | ICD-10-CM | POA: Diagnosis not present

## 2022-05-15 DIAGNOSIS — M961 Postlaminectomy syndrome, not elsewhere classified: Secondary | ICD-10-CM | POA: Diagnosis present

## 2022-05-15 DIAGNOSIS — M5137 Other intervertebral disc degeneration, lumbosacral region: Secondary | ICD-10-CM | POA: Diagnosis not present

## 2022-05-15 DIAGNOSIS — M47816 Spondylosis without myelopathy or radiculopathy, lumbar region: Secondary | ICD-10-CM | POA: Diagnosis present

## 2022-05-15 DIAGNOSIS — M25552 Pain in left hip: Secondary | ICD-10-CM | POA: Insufficient documentation

## 2022-05-15 DIAGNOSIS — M5441 Lumbago with sciatica, right side: Secondary | ICD-10-CM | POA: Diagnosis present

## 2022-05-15 DIAGNOSIS — G35 Multiple sclerosis: Secondary | ICD-10-CM | POA: Insufficient documentation

## 2022-05-15 DIAGNOSIS — M533 Sacrococcygeal disorders, not elsewhere classified: Secondary | ICD-10-CM

## 2022-05-15 DIAGNOSIS — Z79899 Other long term (current) drug therapy: Secondary | ICD-10-CM | POA: Diagnosis present

## 2022-05-15 DIAGNOSIS — Z79891 Long term (current) use of opiate analgesic: Secondary | ICD-10-CM | POA: Insufficient documentation

## 2022-05-15 DIAGNOSIS — G8929 Other chronic pain: Secondary | ICD-10-CM | POA: Insufficient documentation

## 2022-05-15 DIAGNOSIS — M79604 Pain in right leg: Secondary | ICD-10-CM | POA: Diagnosis present

## 2022-05-15 MED ORDER — MORPHINE SULFATE 15 MG PO TABS
15.0000 mg | ORAL_TABLET | Freq: Three times a day (TID) | ORAL | 0 refills | Status: DC
Start: 2022-07-10 — End: 2022-09-06

## 2022-05-15 MED ORDER — MORPHINE SULFATE 15 MG PO TABS
15.0000 mg | ORAL_TABLET | Freq: Three times a day (TID) | ORAL | 0 refills | Status: DC
Start: 2022-08-09 — End: 2022-09-06

## 2022-05-15 MED ORDER — MORPHINE SULFATE 15 MG PO TABS
15.0000 mg | ORAL_TABLET | Freq: Three times a day (TID) | ORAL | 0 refills | Status: DC
Start: 2022-06-10 — End: 2022-09-06

## 2022-05-15 NOTE — Patient Instructions (Signed)
____________________________________________________________________________________________  Opioid Pain Medication Update  To: All patients taking opioid pain medications. (I.e.: hydrocodone, hydromorphone, oxycodone, oxymorphone, morphine, codeine, methadone, tapentadol, tramadol, buprenorphine, fentanyl, etc.)  Re: Updated review of side effects and adverse reactions of opioid analgesics, as well as new information about long term effects of this class of medications.  Direct risks of long-term opioid therapy are not limited to opioid addiction and overdose. Potential medical risks include serious fractures, breathing problems during sleep, hyperalgesia, immunosuppression, chronic constipation, bowel obstruction, myocardial infarction, and tooth decay secondary to xerostomia.  Unpredictable adverse effects that can occur even if you take your medication correctly: Cognitive impairment, respiratory depression, and death. Most people think that if they take their medication "correctly", and "as instructed", that they will be safe. Nothing could be farther from the truth. In reality, a significant amount of recorded deaths associated with the use of opioids has occurred in individuals that had taken the medication for a long time, and were taking their medication correctly. The following are examples of how this can happen: Patient taking his/her medication for a long time, as instructed, without any side effects, is given a certain antibiotic or another unrelated medication, which in turn triggers a "Drug-to-drug interaction" leading to disorientation, cognitive impairment, impaired reflexes, respiratory depression or an untoward event leading to serious bodily harm or injury, including death.  Patient taking his/her medication for a long time, as instructed, without any side effects, develops an acute impairment of liver and/or kidney function. This will lead to a rapid inability of the body to  breakdown and eliminate their pain medication, which will result in effects similar to an "overdose", but with the same medicine and dose that they had always taken. This again may lead to disorientation, cognitive impairment, impaired reflexes, respiratory depression or an untoward event leading to serious bodily harm or injury, including death.  A similar problem will occur with patients as they grow older and their liver and kidney function begins to decrease as part of the aging process.  Background information: Historically, the original case for using long-term opioid therapy to treat chronic noncancer pain was based on safety assumptions that subsequent experience has called into question. In 1996, the American Pain Society and the American Academy of Pain Medicine issued a consensus statement supporting long-term opioid therapy. This statement acknowledged the dangers of opioid prescribing but concluded that the risk for addiction was low; respiratory depression induced by opioids was short-lived, occurred mainly in opioid-naive patients, and was antagonized by pain; tolerance was not a common problem; and efforts to control diversion should not constrain opioid prescribing. This has now proven to be wrong. Experience regarding the risks for opioid addiction, misuse, and overdose in community practice has failed to support these assumptions.  According to the Centers for Disease Control and Prevention, fatal overdoses involving opioid analgesics have increased sharply over the past decade. Currently, more than 96,700 people die from drug overdoses every year. Opioids are a factor in 7 out of every 10 overdose deaths. Deaths from drug overdose have surpassed motor vehicle accidents as the leading cause of death for individuals between the ages of 35 and 54.  Clinical data suggest that neuroendocrine dysfunction may be very common in both men and women, potentially causing hypogonadism, erectile  dysfunction, infertility, decreased libido, osteoporosis, and depression. Recent studies linked higher opioid dose to increased opioid-related mortality. Controlled observational studies reported that long-term opioid therapy may be associated with increased risk for cardiovascular events. Subsequent meta-analysis concluded   that the safety of long-term opioid therapy in elderly patients has not been proven.   Side Effects and adverse reactions: Common side effects: Drowsiness (sedation). Dizziness. Nausea and vomiting. Constipation. Physical dependence -- Dependence often manifests with withdrawal symptoms when opioids are discontinued or decreased. Tolerance -- As you take repeated doses of opioids, you require increased medication to experience the same effect of pain relief. Respiratory depression -- This can occur in healthy people, especially with higher doses. However, people with COPD, asthma or other lung conditions may be even more susceptible to fatal respiratory impairment.  Uncommon side effects: An increased sensitivity to feeling pain and extreme response to pain (hyperalgesia). Chronic use of opioids can lead to this. Delayed gastric emptying (the process by which the contents of your stomach are moved into your small intestine). Muscle rigidity. Immune system and hormonal dysfunction. Quick, involuntary muscle jerks (myoclonus). Arrhythmia. Itchy skin (pruritus). Dry mouth (xerostomia).  Long-term side effects: Chronic constipation. Sleep-disordered breathing (SDB). Increased risk of bone fractures. Hypothalamic-pituitary-adrenal dysregulation. Increased risk of overdose.  RISKS: Fractures and Falls:  Opioids increase the risk and incidence of falls. This is of particular importance in elderly patients.  Endocrine System:  Long-term administration is associated with endocrine abnormalities (endocrinopathies). (Also known as Opioid-induced Endocrinopathy) Influences  on both the hypothalamic-pituitary-adrenal axis?and the hypothalamic-pituitary-gonadal axis have been demonstrated with consequent hypogonadism and adrenal insufficiency in both sexes. Hypogonadism and decreased levels of dehydroepiandrosterone sulfate have been reported in men and women. Endocrine effects include: Amenorrhoea in women (abnormal absence of menstruation) Reduced libido in both sexes Decreased sexual function Erectile dysfunction in men Hypogonadisms (decreased testicular function with shrinkage of testicles) Infertility Depression and fatigue Loss of muscle mass Anxiety Depression Immune suppression Hyperalgesia Weight gain Anemia Osteoporosis Patients (particularly women of childbearing age) should avoid opioids. There is insufficient evidence to recommend routine monitoring of asymptomatic patients taking opioids in the long-term for hormonal deficiencies.  Immune System: Human studies have demonstrated that opioids have an immunomodulating effect. These effects are mediated via opioid receptors both on immune effector cells and in the central nervous system. Opioids have been demonstrated to have adverse effects on antimicrobial response and anti-tumour surveillance. Buprenorphine has been demonstrated to have no impact on immune function.  Opioid Induced Hyperalgesia: Human studies have demonstrated that prolonged use of opioids can lead to a state of abnormal pain sensitivity, sometimes called opioid induced hyperalgesia (OIH). Opioid induced hyperalgesia is not usually seen in the absence of tolerance to opioid analgesia. Clinically, hyperalgesia may be diagnosed if the patient on long-term opioid therapy presents with increased pain. This might be qualitatively and anatomically distinct from pain related to disease progression or to breakthrough pain resulting from development of opioid tolerance. Pain associated with hyperalgesia tends to be more diffuse than the  pre-existing pain and less defined in quality. Management of opioid induced hyperalgesia requires opioid dose reduction.  Cancer: Chronic opioid therapy has been associated with an increased risk of cancer among noncancer patients with chronic pain. This association was more evident in chronic strong opioid users. Chronic opioid consumption causes significant pathological changes in the small intestine and colon. Epidemiological studies have found that there is a link between opium dependence and initiation of gastrointestinal cancers. Cancer is the second leading cause of death after cardiovascular disease. Chronic use of opioids can cause multiple conditions such as GERD, immunosuppression and renal damage as well as carcinogenic effects, which are associated with the incidence of cancers.   Mortality: Long-term opioid use   has been associated with increased mortality among patients with chronic non-cancer pain (CNCP).  Prescription of long-acting opioids for chronic noncancer pain was associated with a significantly increased risk of all-cause mortality, including deaths from causes other than overdose.  Reference: Von Korff M, Kolodny A, Deyo RA, Chou R. Long-term opioid therapy reconsidered. Ann Intern Med. 2011 Sep 6;155(5):325-8. doi: 10.7326/[id number]. PMID: 21893626; PMCID: PMC3280085. Bedson J, Chen Y, Ashworth J, Hayward RA, Dunn KM, Jordan KP. Risk of adverse events in patients prescribed long-term opioids: A cohort study in the [place] Clinical Practice Research Datalink. Eur J Pain. 2019 May;23(5):908-922. doi: 10.1002/ejp.1357. Epub 2019 Jan 31. PMID: 30620116. Colameco S, Coren JS, Ciervo CA. Continuous opioid treatment for chronic noncancer pain: a time for moderation in prescribing. Postgrad Med. 2009 Jul;121(4):61-6. doi: 10.3810/pgm.2009.07.2032. PMID: 19641271. Chou R, Turner JA, Devine EB, Hansen RN, Sullivan SD, Blazina I, Dana T, Bougatsos C, Deyo RA. The  effectiveness and risks of long-term opioid therapy for chronic pain: a systematic review for a National Institutes of Health Pathways to Prevention Workshop. Ann Intern Med. 2015 Feb 17;162(4):276-86. doi: 10.7326/M14-2559. PMID: 25581257. Warner M, Chen LH, Makuc DM. NCHS Data Brief No. 22. [place]: Centers for Disease Control and Prevention; 2009. Sep, Increase in Fatal Poisonings Involving Opioid Analgesics in the [place], 1999-2006. Song IA, Choi HR, Oh TK. Long-term opioid use and mortality in patients with chronic non-cancer pain: Ten-year follow-up study in [place] from 2010 through 2019. EClinicalMedicine. 2022 Jul 18;51:101558. doi: 10.1016/j.eclinm.2022.101558. PMID: 35875817; PMCID: PMC9304910. Huser, W., Schubert, T., Vogelmann, T. et al. All-cause mortality in patients with long-term opioid therapy compared with non-opioid analgesics for chronic non-cancer pain: a database study. BMC Med 18, 162 (2020). https://doi.org/10.1186/[email or web address] Rashidian H, Zendehdel K, Kamangar F, Malekzadeh R, Haghdoost AA. An Ecological Study of the Association between Opiate Use and Incidence of Cancers. Addict Health. 2016 Fall;8(4):252-260. PMID: 28819556; PMCID: PMC5554805.  Our Goal: Our goal is to control your pain with means other than the use of opioid pain medications.  Our Recommendation: Talk to your physician about coming off of these medications. We can assist you with the tapering down and stopping these medicines. Based on the new information, even if you cannot completely stop the medication, a decrease in the dose may be associated with a lesser risk. Ask for other means of controlling the pain. Decrease or eliminate those factors that significantly contribute to your pain such as smoking, obesity, and a diet heavily tilted towards "inflammatory" nutrients.  Last Updated: 03/08/2022    ____________________________________________________________________________________________     ____________________________________________________________________________________________  Patient Information update  To: All of our patients.  Re: Name change.  It has been made official that our current name, "[facility]"   will soon be changed to "[facility]".   The purpose of this change is to eliminate any confusion created by the concept of our practice being a "Medication Management Pain Clinic". In the past this has led to the misconception that we treat pain primarily by the use of prescription medications.  Nothing can be farther from the truth.   Understanding PAIN MANAGEMENT: To further understand what our practice does, you first have to understand that "Pain Management" is a subspecialty that requires additional training once a physician has completed their specialty training, which can be in either Anesthesia, Neurology, Psychiatry, or Physical Medicine and Rehabilitation (PMR). Each one of these contributes to the final approach taken by each physician to   the management of their patient's pain. To be a "Pain Management Specialist" you must have first completed one of the specialty trainings below.  Anesthesiologists - trained in clinical pharmacology and interventional techniques such as nerve blockade and regional as well as central neuroanatomy. They are trained to block pain before, during, and after surgical interventions.  Neurologists - trained in the diagnosis and pharmacological treatment of complex neurological conditions, such as Multiple Sclerosis, Parkinson's, spinal cord injuries, and other systemic conditions that may be associated with symptoms that may include but are not limited to pain. They tend to rely primarily on the treatment of chronic pain  using prescription medications.  Psychiatrist - trained in conditions affecting the psychosocial wellbeing of patients including but not limited to depression, anxiety, schizophrenia, personality disorders, addiction, and other substance use disorders that may be associated with chronic pain. They tend to rely primarily on the treatment of chronic pain using prescription medications.   Physical Medicine and Rehabilitation (PMR) physicians, also known as physiatrists - trained to treat a wide variety of medical conditions affecting the brain, spinal cord, nerves, bones, joints, ligaments, muscles, and tendons. Their training is primarily aimed at treating patients that have suffered injuries that have caused severe physical impairment. Their training is primarily aimed at the physical therapy and rehabilitation of those patients. They may also work alongside orthopedic surgeons or neurosurgeons using their expertise in assisting surgical patients to recover after their surgeries.  INTERVENTIONAL PAIN MANAGEMENT is sub-subspecialty of Pain Management.  Our physicians are Board-certified in Anesthesia, Pain Management, and Interventional Pain Management.  This meaning that not only have they been trained and Board-certified in their specialty of Anesthesia, and subspecialty of Pain Management, but they have also received further training in the sub-subspecialty of Interventional Pain Management, in order to become Board-certified as INTERVENTIONAL PAIN MANAGEMENT SPECIALIST.    Mission: Our goal is to use our skills in  [place] as alternatives to the chronic use of prescription opioid medications for the treatment of pain. To make this more clear, we have changed our name to reflect what we do and offer. We will continue to offer medication management assessment and recommendations, but we will not be taking over any patient's medication  management.  ____________________________________________________________________________________________     ____________________________________________________________________________________________  National Pain Medication Shortage  The [place] is experiencing worsening drug shortages. These have had a negative widespread effect on patient care and treatment. Not expected to improve any time soon. Predicted to last past 2029.   Drug shortage list (generic names) Oxycodone IR Oxycodone/APAP Oxymorphone IR Hydromorphone Hydrocodone/APAP Morphine  Where is the problem?  Manufacturing and supply level.  Will this shortage affect you?  Only if you take any of the above pain medications.  How? You may be unable to fill your prescription.  Your pharmacist may offer a "partial fill" of your prescription. (Warning: Do not accept partial fills.) Prescriptions partially filled cannot be transferred to another pharmacy. Read our Medication Rules and Regulation. Depending on how much medicine you are dependent on, you may experience withdrawals when unable to get the medication.  Recommendations: Consider ending your dependence on opioid pain medications. Ask your pain specialist to assist you with the process. Consider switching to a medication currently not in shortage, such as Buprenorphine. Talk to your pain specialist about this option. Consider decreasing your pain medication requirements by managing tolerance thru "Drug Holidays". This may help minimize withdrawals, should you run out of medicine. Control your pain thru  the use of non-pharmacological interventional therapies.   Your prescriber: Prescribers cannot be blamed for shortages. Medication manufacturing and supply issues cannot be fixed by the prescriber.   NOTE: The prescriber is not responsible for supplying the medication, or solving supply issues. Work with your pharmacist to solve it. The patient is responsible for  the decision to take or continue taking the medication and for identifying and securing a legal supply source. By law, supplying the medication is the job and responsibility of the pharmacy. The prescriber is responsible for the evaluation, monitoring, and prescribing of these medications.   Prescribers will NOT: Re-issue prescriptions that have been partially filled. Re-issue prescriptions already sent to a pharmacy.  Re-send prescriptions to a different pharmacy because yours did not have your medication. Ask pharmacist to order more medicine or transfer the prescription to another pharmacy. (Read below.)  New 2023 regulation: "September 09, 2021 Revised Regulation Allows DEA-Registered Pharmacies to Transfer Electronic Prescriptions at a Patient's Request [place] Patients now have the ability to request their electronic prescription be transferred to another pharmacy without having to go back to their practitioner to initiate the request. This revised regulation went into effect on Monday, September 05, 2021.     At a patient's request, a DEA-registered retail pharmacy can now transfer an electronic prescription for a controlled substance (schedules II-V) to another DEA-registered retail pharmacy. Prior to this change, patients would have to go through their practitioner to cancel their prescription and have it re-issued to a different pharmacy. The process was taxing and time consuming for both patients and practitioners.    The Drug Enforcement Administration [facility]) published its intent to revise the process for transferring electronic prescriptions on November 28, 2019.  The final rule was published in the federal register on August 04, 2021 and went into effect 30 days later.  Under the final rule, a prescription can only be transferred once between pharmacies, and only if allowed under existing state or other applicable law. The prescription must  remain in its electronic form; may not be altered in any way; and the transfer must be communicated directly between two licensed pharmacists. It's important to note, any authorized refills transfer with the original prescription, which means the entire prescription will be filled at the same pharmacy".  Reference: [employer] [facility] website announcement)  [email or web address].pdf ([place])   [employer] / Vol. 88, No. 143 / Thursday, August 04, 2021 / Rules and Regulations DEPARTMENT OF JUSTICE  Drug Enforcement Administration  21 CFR Part 1306  [Docket No. [id number]]  RIN [id number] Transfer of Electronic Prescriptions for Schedules II-V Controlled Substances Between Pharmacies for Initial Filling  ____________________________________________________________________________________________     ____________________________________________________________________________________________  Transfer of Pain Medication between Pharmacies  Re: 2023 DEA Clarification on existing regulation  Published on DEA Website: September 09, 2021  Title: Revised Regulation Allows DEA-Registered Pharmacies to Conservator, museum/gallery Prescriptions at a Patient's Request [place]  "Patients now have the ability to request their electronic prescription be transferred to another pharmacy without having to go back to their practitioner to initiate the request. This revised regulation went into effect on Monday, September 05, 2021.     At a patient's request, a DEA-registered retail pharmacy can now transfer an electronic prescription for a controlled substance (schedules II-V) to another DEA-registered retail pharmacy. Prior to this change, patients would have to go through their practitioner to  cancel their prescription  and have it re-issued to a different pharmacy. The process was taxing and time consuming for both patients and practitioners.    The Drug Enforcement Administration (DEA) published its intent to revise the process for transferring electronic prescriptions on November 28, 2019.  The final rule was published in the federal register on August 04, 2021 and went into effect 30 days later.  Under the final rule, a prescription can only be transferred once between pharmacies, and only if allowed under existing state or other applicable law. The prescription must remain in its electronic form; may not be altered in any way; and the transfer must be communicated directly between two licensed pharmacists. It's important to note, any authorized refills transfer with the original prescription, which means the entire prescription will be filled at the same pharmacy."    REFERENCES: 1. DEA website announcement https://www.dea.gov/stories/2023/2023-09/2021-09-01/revised-regulation-allows-dea-registered-pharmacies-transfer  2. Department of Justice website  https://www.govinfo.gov/content/pkg/FR-2021-08-04/pdf/2023-15847.pdf  3. DEPARTMENT OF JUSTICE Drug Enforcement Administration 21 CFR Part 1306 [Docket No. [id number]] RIN [id number] "Transfer of Electronic Prescriptions for Schedules II-V Controlled Substances Between Pharmacies for Initial Filling"  ____________________________________________________________________________________________     _______________________________________________________________________  Medication Rules  Purpose: To inform patients, and their family members, of our medication rules and regulations.  Applies to: All patients receiving prescriptions from our practice (written or electronic).  Pharmacy of record: This is the pharmacy where your electronic prescriptions will be sent. Make sure we have the correct one.  Electronic prescriptions: In  compliance with the [place] Strengthen Opioid Misuse Prevention (STOP) Act of 2017 (Session Law 2017-74/H243), effective January 09, 2018, all controlled substances must be electronically prescribed. Written prescriptions, faxing, or calling prescriptions to a pharmacy will no longer be done.  Prescription refills: These will be provided only during in-person appointments. No medications will be renewed without a "face-to-face" evaluation with your provider. Applies to all prescriptions.  NOTE: The following applies primarily to controlled substances (Opioid* Pain Medications).   Type of encounter (visit): For patients receiving controlled substances, face-to-face visits are required. (Not an option and not up to the patient.)  Patient's responsibilities: Pain Pills: Bring all pain pills to every appointment (except for procedure appointments). Pill Bottles: Bring pills in original pharmacy bottle. Bring bottle, even if empty. Always bring the bottle of the most recent fill.  Medication refills: You are responsible for knowing and keeping track of what medications you are taking and when is it that you will need a refill. The day before your appointment: write a list of all prescriptions that need to be refilled. The day of the appointment: give the list to the admitting nurse. Prescriptions will be written only during appointments. No prescriptions will be written on procedure days. If you forget a medication: it will not be "Called in", "Faxed", or "electronically sent". You will need to get another appointment to get these prescribed. No early refills. Do not call asking to have your prescription filled early. Partial  or short prescriptions: Occasionally your pharmacy may not have enough pills to fill your prescription.  NEVER ACCEPT a partial fill or a prescription that is short of the total amount of pills that you were prescribed.  With controlled substances the law allows 72 hours for  the pharmacy to complete the prescription.  If the prescription is not completed within 72 hours, the pharmacist will require a new prescription to be written. This means that you will be short on your medicine and we WILL NOT send another prescription to complete your original   prescription.  Instead, request the pharmacy to send a carrier to a nearby branch to get enough medication to provide you with your full prescription. Prescription Accuracy: You are responsible for carefully inspecting your prescriptions before leaving our office. Have the discharge nurse carefully go over each prescription with you, before taking them home. Make sure that your name is accurately spelled, that your address is correct. Check the name and dose of your medication to make sure it is accurate. Check the number of pills, and the written instructions to make sure they are clear and accurate. Make sure that you are given enough medication to last until your next medication refill appointment. Taking Medication: Take medication as prescribed. When it comes to controlled substances, taking less pills or less frequently than prescribed is permitted and encouraged. Never take more pills than instructed. Never take the medication more frequently than prescribed.  Inform other Doctors: Always inform, all of your healthcare providers, of all the medications you take. Pain Medication from other Providers: You are not allowed to accept any additional pain medication from any other Doctor or Healthcare provider. There are two exceptions to this rule. (see below) In the event that you require additional pain medication, you are responsible for notifying us, as stated below. Cough Medicine: Often these contain an opioid, such as codeine or hydrocodone. Never accept or take cough medicine containing these opioids if you are already taking an opioid* medication. The combination may cause respiratory failure and death. Medication Agreement:  You are responsible for carefully reading and following our Medication Agreement. This must be signed before receiving any prescriptions from our practice. Safely store a copy of your signed Agreement. Violations to the Agreement will result in no further prescriptions. (Additional copies of our Medication Agreement are available upon request.) Laws, Rules, & Regulations: All patients are expected to follow all Federal and State Laws, Statutes, Rules, & Regulations. Ignorance of the Laws does not constitute a valid excuse.  Illegal drugs and Controlled Substances: The use of illegal substances (including, but not limited to marijuana and its derivatives) and/or the illegal use of any controlled substances is strictly prohibited. Violation of this rule may result in the immediate and permanent discontinuation of any and all prescriptions being written by our practice. The use of any illegal substances is prohibited. Adopted CDC guidelines & recommendations: Target dosing levels will be at or below 60 MME/day. Use of benzodiazepines** is not recommended.  Exceptions: There are only two exceptions to the rule of not receiving pain medications from other Healthcare Providers. Exception #1 (Emergencies): In the event of an emergency (i.e.: accident requiring emergency care), you are allowed to receive additional pain medication. However, you are responsible for: As soon as you are able, call our office [phone], at any time of the day or night, and leave a message stating your name, the date and nature of the emergency, and the name and dose of the medication prescribed. In the event that your call is answered by a member of our staff, make sure to document and save the date, time, and the name of the person that took your information.  Exception #2 (Planned Surgery): In the event that you are scheduled by another doctor or dentist to have any type of surgery or procedure, you are allowed (for a period no  longer than 30 days), to receive additional pain medication, for the acute post-op pain. However, in this case, you are responsible for picking up a copy of   our "Post-op Pain Management for Surgeons" handout, and giving it to your surgeon or dentist. This document is available at our office, and does not require an appointment to obtain it. Simply go to our office during business hours (Monday-Thursday from 8:00 AM to 4:00 PM) (Friday 8:00 AM to 12:00 Noon) or if you have a scheduled appointment with us, prior to your surgery, and ask for it by name. In addition, you are responsible for: calling our office [phone], at any time of the day or night, and leaving a message stating your name, name of your surgeon, type of surgery, and date of procedure or surgery. Failure to comply with your responsibilities may result in termination of therapy involving the controlled substances. Medication Agreement Violation. Following the above rules, including your responsibilities will help you in avoiding a Medication Agreement Violation ("Breaking your Pain Medication Contract").  Consequences:  Not following the above rules may result in permanent discontinuation of medication prescription therapy.  *Opioid medications include: morphine, codeine, oxycodone, oxymorphone, hydrocodone, hydromorphone, meperidine, tramadol, tapentadol, buprenorphine, fentanyl, methadone. **Benzodiazepine medications include: diazepam (Valium), alprazolam (Xanax), clonazepam (Klonopine), lorazepam (Ativan), clorazepate (Tranxene), chlordiazepoxide (Librium), estazolam (Prosom), oxazepam (Serax), temazepam (Restoril), triazolam (Halcion) (Last updated: 11/01/2021) ______________________________________________________________________    ______________________________________________________________________  Medication Recommendations and Reminders  Applies to: All patients receiving prescriptions (written and/or  electronic).  Medication Rules & Regulations: You are responsible for reading, knowing, and following our "Medication Rules" document. These exist for your safety and that of others. They are not flexible and neither are we. Dismissing or ignoring them is an act of "non-compliance" that may result in complete and irreversible termination of such medication therapy. For safety reasons, "non-compliance" will not be tolerated. As with the [place] fundamental legal principle of "ignorance of the law is no defense", we will accept no excuses for not having read and knowing the content of documents provided to you by our practice.  Pharmacy of record:  Definition: This is the pharmacy where your electronic prescriptions will be sent.  We do not endorse any particular pharmacy. It is up to you and your insurance to decide what pharmacy to use.  We do not restrict you in your choice of pharmacy. However, once we write for your prescriptions, we will NOT be re-sending more prescriptions to fix restricted supply problems created by your pharmacy, or your insurance.  The pharmacy listed in the electronic medical record should be the one where you want electronic prescriptions to be sent. If you choose to change pharmacy, simply notify our nursing staff. Changes will be made only during your regular appointments and not over the phone.  Recommendations: Keep all of your pain medications in a safe place, under lock and key, even if you live alone. We will NOT replace lost, stolen, or damaged medication. We do not accept "Police Reports" as proof of medications having been stolen. After you fill your prescription, take 1 week's worth of pills and put them away in a safe place. You should keep a separate, properly labeled bottle for this purpose. The remainder should be kept in the original bottle. Use this as your primary supply, until it runs out. Once it's gone, then you know that you have 1 week's worth of medicine,  and it is time to come in for a prescription refill. If you do this correctly, it is unlikely that you will ever run out of medicine. To make sure that the above recommendation works, it is very important that you make   sure your medication refill appointments are scheduled at least 1 week before you run out of medicine. To do this in an effective manner, make sure that you do not leave the office without scheduling your next medication management appointment. Always ask the nursing staff to show you in your prescription , when your medication will be running out. Then arrange for the receptionist to get you a return appointment, at least 7 days before you run out of medicine. Do not wait until you have 1 or 2 pills left, to come in. This is very poor planning and does not take into consideration that we may need to cancel appointments due to bad weather, sickness, or emergencies affecting our staff. DO NOT ACCEPT A "Partial Fill": If for any reason your pharmacy does not have enough pills/tablets to completely fill or refill your prescription, do not allow for a "partial fill". The law allows the pharmacy to complete that prescription within 72 hours, without requiring a new prescription. If they do not fill the rest of your prescription within those 72 hours, you will need a separate prescription to fill the remaining amount, which we will NOT provide. If the reason for the partial fill is your insurance, you will need to talk to the pharmacist about payment alternatives for the remaining tablets, but again, DO NOT ACCEPT A PARTIAL FILL, unless you can trust your pharmacist to obtain the remainder of the pills within 72 hours.  Prescription refills and/or changes in medication(s):  Prescription refills, and/or changes in dose or medication, will be conducted only during scheduled medication management appointments. (Applies to both, written and electronic prescriptions.) No refills on procedure days. No  medication will be changed or started on procedure days. No changes, adjustments, and/or refills will be conducted on a procedure day. Doing so will interfere with the diagnostic portion of the procedure. No phone refills. No medications will be "called into the pharmacy". No Fax refills. No weekend refills. No Holliday refills. No after hours refills.  Remember:  Business hours are:  Monday to Thursday 8:00 AM to 4:00 PM Provider's Schedule: Ilhan Madan, MD - Appointments are:  Medication management: Monday and Wednesday 8:00 AM to 4:00 PM Procedure day: Tuesday and Thursday 7:30 AM to 4:00 PM Bilal Lateef, MD - Appointments are:  Medication management: Tuesday and Thursday 8:00 AM to 4:00 PM Procedure day: Monday and Wednesday 7:30 AM to 4:00 PM (Last update: 11/01/2021) ______________________________________________________________________    ____________________________________________________________________________________________  Drug Holidays  What is a "Drug Holiday"? Drug Holiday: is the name given to the process of slowly tapering down and temporarily stopping the pain medication for the purpose of decreasing or eliminating tolerance to the drug.  Benefits Improved effectiveness Decreased required effective dose Improved pain control End dependence on high dose therapy Decrease cost of therapy Uncovering "opioid-induced hyperalgesia". (OIH)  What is "opioid hyperalgesia"? It is a paradoxical increase in pain caused by exposure to opioids. Stopping the opioid pain medication, contrary to the expected, it actually decreases or completely eliminates the pain. Ref.: "A comprehensive review of opioid-induced hyperalgesia". Marion Lee, et.al. Pain Physician. 2011 Mar-Apr;14(2):145-61.  What is tolerance? Tolerance: the progressive loss of effectiveness of a pain medicine due to repetitive use. A common problem of opioid pain medications.  How long should a "Drug  Holiday" last? Effectiveness depends on the patient staying off all opioid pain medicines for a minimum of 14 consecutive days. (2 weeks)  How about just taking less of the medicine? Does not   work. Will not accomplish goal of eliminating the excess receptors.  How about switching to a different pain medicine? (AKA. "Opioid rotation") Does not work. Creates the illusion of effectiveness by taking advantage of inaccurate equivalent dose calculations between different opioids. -This "technique" was promoted by studies funded by pharmaceutical companies, such as [employer], creators of "OxyContin".  Can I stop the medicine "cold turkey"? We do not recommend it. You should always coordinate with your prescribing physician to make the transition as smoothly as possible. Avoid stopping the medicine abruptly without consulting. We recommend a "slow taper".  What is a slow taper? Taper: refers to the gradual decrease in dose.   How do I stop/taper the dose? Slowly. Decrease the daily amount of pills that you take by one (1) pill every seven (7) days. This is called a "slow downward taper". Example: if you normally take four (4) pills per day, drop it to three (3) pills per day for seven (7) days, then to two (2) pills per day for seven (7) days, then to one (1) per day for seven (7) days, and then stop the medicine. The 14 day "Drug Holiday" starts on the first day without medicine.   Will I experience withdrawals? Unlikely with a slow taper.  What triggers withdrawals? Withdrawals are triggered by the sudden/abrupt stop of high dose opioids. Withdrawals can be avoided by slowly decreasing the dose over a prolonged period of time.  What are withdrawals? Symptoms associated with sudden/abrupt reduction/stopping of high-dose, long-term use of pain medication. Withdrawal are seldom seen on low dose therapy, or patients rarely taking opioid medication.  Early Withdrawal Symptoms may  include: Agitation Anxiety Muscle aches Increased tearing Insomnia Runny nose Sweating Yawning  Late symptoms may include: Abdominal cramping Diarrhea Dilated pupils Goose bumps Nausea Vomiting  When could I see withdrawals? Onset: 8-24 hours after last use for most opioids. 12-48 hours for long-acting opioids (i.e.: methadone)  How long could they last? Duration: 4-10 days for most opioids. 14-21 days for long-acting opioids (i.e.: methadone)  What will happen after I complete my "Drug Holiday"? The need and indications for the opioid analgesic will be reviewed before restarting the medication. Dose requirements will likely decrease and the dose will need to be adjusted accordingly.   (Last update: 03/29/2022) ____________________________________________________________________________________________    ____________________________________________________________________________________________  WARNING: CBD (cannabidiol) & Delta (Delta-8 tetrahydrocannabinol) products.   Applicable to:  All individuals currently taking or considering taking CBD (cannabidiol) and, more important, all patients taking opioid analgesic controlled substances (pain medication). (Example: oxycodone; oxymorphone; hydrocodone; hydromorphone; morphine; methadone; tramadol; tapentadol; fentanyl; buprenorphine; butorphanol; dextromethorphan; meperidine; codeine; etc.)  Introduction:  Recently there has been a drive towards the use of "natural" products for the treatment of different conditions, including pain anxiety and sleep disorders. Marijuana and hemp are two varieties of the cannabis genus plants. Marijuana and its derivatives are illegal, while hemp and its derivatives are not. Cannabidiol (CBD) and tetrahydrocannabinol (THC), are two natural compounds found in plants of the Cannabis genus. They can both be extracted from hemp or marijuana. Both compounds interact with your body's endocannabinoid  system in very different ways. CBD is associated with pain relief (analgesia) while THC is associated with the psychoactive effects ("the high") obtained from the use of marijuana products. There are two main types of THC: Delta-9, which comes from the marijuana plant and it is illegal, and Delta-8, which comes from the hemp plant, and it is legal. (Both, Delta-9-THC and Delta-8-THC are psychoactive and   give you "the high".)   Legality:  Marijuana and its derivatives: illegal Hemp and its derivatives: Legal (State dependent) UPDATE: (02/25/2021) The Drug Enforcement Agency (DEA) issued a letter stating that "delta" cannabinoids, including Delta-8-THCO and Delta-9-THCO, synthetically derived from hemp do not qualify as hemp and will be viewed as Schedule I drugs. (Schedule I drugs, substances, or chemicals are defined as drugs with no currently accepted medical use and a high potential for abuse. Some examples of Schedule I drugs are: heroin, lysergic acid diethylamide (LSD), marijuana (cannabis), 3,4-methylenedioxymethamphetamine (ecstasy), methaqualone, and peyote.) (https://www.dea.gov)  Legal status of CBD in [place]:  "Conditionally Legal"  Reference: "FDA Regulation of Cannabis and Cannabis-Derived Products, Including Cannabidiol (CBD)" - https://www.fda.gov/news-events/public-health-focus/fda-regulation-cannabis-and-cannabis-derived-products-including-cannabidiol-cbd  Warning:  CBD is not FDA approved and has not undergo the same manufacturing controls as prescription drugs.  This means that the purity and safety of available CBD may be questionable. Most of the time, despite manufacturer's claims, it is contaminated with THC (delta-9-tetrahydrocannabinol - the chemical in marijuana responsible for the "HIGH").  When this is the case, the THC contaminant will trigger a positive urine drug screen (UDS) test for Marijuana (carboxy-THC).   The FDA recently put out a warning about 5 things that everyone  should be aware of regarding Delta-8 THC: Delta-8 THC products have not been evaluated or approved by the FDA for safe use and may be marketed in ways that put the public health at risk. The FDA has received adverse event reports involving delta-8 THC-containing products. Delta-8 THC has psychoactive and intoxicating effects. Delta-8 THC manufacturing often involve use of potentially harmful chemicals to create the concentrations of delta-8 THC claimed in the marketplace. The final delta-8 THC product may have potentially harmful by-products (contaminants) due to the chemicals used in the process. Manufacturing of delta-8 THC products may occur in uncontrolled or unsanitary settings, which may lead to the presence of unsafe contaminants or other potentially harmful substances. Delta-8 THC products should be kept out of the reach of children and pets.  NOTE: Because a positive UDS for any illicit substance is a violation of our medication agreement, your opioid analgesics (pain medicine) may be permanently discontinued.  MORE ABOUT CBD  General Information: CBD was discovered in 1940 and it is a derivative of the cannabis sativa genus plants (Marijuana and Hemp). It is one of the 113 identified substances found in Marijuana. It accounts for up to 40% of the plant's extract. As of 2018, preliminary clinical studies on CBD included research for the treatment of anxiety, movement disorders, and pain. CBD is available and consumed in multiple forms, including inhalation of smoke or vapor, as an aerosol spray, and by mouth. It may be supplied as an oil containing CBD, capsules, dried cannabis, or as a liquid solution. CBD is thought not to be as psychoactive as THC (delta-9-tetrahydrocannabinol - the chemical in marijuana responsible for the "HIGH"). Studies suggest that CBD may interact with different biological target receptors in the body, including cannabinoid and other neurotransmitter receptors. As of  2018 the mechanism of action for its biological effects has not been determined.  Side-effects  Adverse reactions: Dry mouth, diarrhea, decreased appetite, fatigue, drowsiness, malaise, weakness, sleep disturbances, and others.  Drug interactions:  CBD may interact with medications such as blood-thinners. CBD causes drowsiness on its own and it will increase drowsiness caused by other medications, including antihistamines (such as Benadryl), benzodiazepines (Xanax, Ativan, Valium), antipsychotics, antidepressants, opioids, alcohol and supplements such as kava, melatonin and St. John's Wort.    Other drug interactions: Brivaracetam (Briviact); Caffeine; Carbamazepine (Tegretol); Citalopram (Celexa); Clobazam (Onfi); Eslicarbazepine (Aptiom); Everolimus (Zostress); Lithium; Methadone (Dolophine); Rufinamide (Banzel); Sedative medications (CNS depressants); Sirolimus (Rapamune); Stiripentol (Diacomit); Tacrolimus (Prograf); Tamoxifen ; Soltamox); Topiramate (Topamax); Valproate; Warfarin (Coumadin); Zonisamide. (Last update: 12/19/2021) ____________________________________________________________________________________________   ____________________________________________________________________________________________  Naloxone Nasal Spray  Why am I receiving this medication? [place] STOP ACT requires that all patients taking high dose opioids or at risk of opioids respiratory depression, be prescribed an opioid reversal agent, such as Naloxone (AKA: Narcan).  What is this medication? NALOXONE (nal OX one) treats opioid overdose, which causes slow or shallow breathing, severe drowsiness, or trouble staying awake. Call emergency services after using this medication. You may need additional treatment. Naloxone works by reversing the effects of opioids. It belongs to a group of medications called opioid blockers.  COMMON BRAND NAME(S): Kloxxado, Narcan  What should I tell my care team before  I take this medication? They need to know if you have any of these conditions: Heart disease Substance use disorder An unusual or allergic reaction to naloxone, other medications, foods, dyes, or preservatives Pregnant or trying to get pregnant Breast-feeding  When to use this medication? This medication is to be used for the treatment of respiratory depression (less than 8 breaths per minute) secondary to opioid overdose.   How to use this medication? This medication is for use in the nose. Lay the person on their back. Support their neck with your hand and allow the head to tilt back before giving the medication. The nasal spray should be given into 1 nostril. After giving the medication, move the person onto their side. Do not remove or test the nasal spray until ready to use. Get emergency medical help right away after giving the first dose of this medication, even if the person wakes up. You should be familiar with how to recognize the signs and symptoms of a narcotic overdose. If more doses are needed, give the additional dose in the other nostril. Talk to your care team about the use of this medication in children. While this medication may be prescribed for children as young as newborns for selected conditions, precautions do apply.  Naloxone Overdosage: If you think you have taken too much of this medicine contact a poison control center or emergency room at once.  NOTE: This medicine is only for you. Do not share this medicine with others.  What if I miss a dose? This does not apply.  What may interact with this medication? This is only used during an emergency. No interactions are expected during emergency use. This list may not describe all possible interactions. Give your health care provider a list of all the medicines, herbs, non-prescription drugs, or dietary supplements you use. Also tell them if you smoke, drink alcohol, or use illegal drugs. Some items may interact with  your medicine.  What should I watch for while using this medication? Keep this medication ready for use in the case of an opioid overdose. Make sure that you have the phone number of your care team and local hospital ready. You may need to have additional doses of this medication. Each nasal spray contains a single dose. Some emergencies may require additional doses. After use, bring the treated person to the nearest hospital or call 911. Make sure the treating care team knows that the person has received a dose of this medication. You will receive additional instructions on what to do during and after use of this   medication before an emergency occurs.  What side effects may I notice from receiving this medication? Side effects that you should report to your care team as soon as possible: Allergic reactions--skin rash, itching, hives, swelling of the face, lips, tongue, or throat Side effects that usually do not require medical attention (report these to your care team if they continue or are bothersome): Constipation Dryness or irritation inside the nose Headache Increase in blood pressure Muscle spasms Stuffy nose Toothache This list may not describe all possible side effects. Call your doctor for medical advice about side effects. You may report side effects to FDA at 1-800-FDA-1088.  Where should I keep my medication? Because this is an emergency medication, you should keep it with you at all times.  Keep out of the reach of children and pets. Store between 20 and 25 degrees C (68 and 77 degrees F). Do not freeze. Throw away any unused medication after the expiration date. Keep in original box until ready to use.  NOTE: This sheet is a summary. It may not cover all possible information. If you have questions about this medicine, talk to your doctor, pharmacist, or health care provider.   2023 Elsevier/Gold Standard (2020-09-03  00:00:00)  ____________________________________________________________________________________________   _________________________________________________________________________________________  Body mass index (BMI) Weight Management Required  URGENT: Dear Ms. Dalia your weight has been found to be adversely affecting your health. Aggressive action is immediately required. Talk to your primary care physician.  Body mass index (BMI) is a common tool for deciding whether a person has an appropriate body weight.  It measures a persons weight in relation to their height.   According to the [facility] of health (NIH): A BMI of less than 18.5 means that a person is underweight. A BMI of between 18.5 and 24.9 is ideal. A BMI of between 25 and 29.9 is overweight. A BMI over 30 indicates obesity.  Body Mass Index (BMI) Classification BMI level (kg/m2) Category Associated incidence of chronic pain  <18  Underweight   18.5-24.9 Ideal body weight   25-29.9 Overweight  20%  30-34.9 Obese (Class I)  68%  35-39.9 Severe obesity (Class II)  136%  >40 Extreme obesity (Class III)  254%   Your current Estimated body mass index is 57.61 kg/m as calculated from the following:   Height as of this encounter: 5\' 2"  (1.575 m).   Weight as of this encounter: 315 lb (142.9 kg).  Morbidly Obese Classification: You will be considered to be "Morbidly Obese" if your BMI is above 30 and you have one or more of the following conditions caused or associated to obesity: 1.    Type 2 Diabetes (Leading to cardiovascular diseases (CVD), stroke, peripheral vascular diseases (PVD), retinopathy, nephropathy, and neuropathy) 2.    Cardiovascular Disease (High Blood Pressure; Congestive Heart Failure; High Cholesterol; Coronary Artery Disease; Angina; Arrhythmias, Dysrhythmias, or Heart Attacks) 3.    Breathing problems (Asthma; obesity-hypoventilation syndrome; obstructive sleep apnea; chronic inflammatory  airway disease; reactive airway disease; or shortness of breath) 4.    Chronic kidney disease 5.    Liver disease (nonalcoholic fatty liver disease) 6.    High blood pressure 7.    Acid reflux (gastroesophageal reflux disease; heartburn) 8.    Osteoarthritis (OA) (affecting the hip(s), the knee(s) and/or the lower back) 9.    Low back pain (Lumbar Facet Syndrome; and/or Degenerative Disc Disease) 10.  Hip pain (Osteoarthritis of hip) (For every 1 lbs of added body weight, there is a  2 lbs increase in pressure inside of each hip articulation. 1:2 mechanical relationship) 11.  Knee pain (Osteoarthritis of knee) (For every 1 lbs of added body weight, there is a 4 lbs increase in pressure inside of each knee articulation. 1:4 mechanical relationship) (patients with a BMI>30 kg/m2 were 6.8 times more likely to develop knee OA than normal-weight individuals) 12.  Cancer: Epidemiological studies have shown that obesity is a risk factor for: post-menopausal breast cancer; cancers of the endometrium, colon and kidney cancer; malignant adenomas of the oesophagus. Obese subjects have an approximately 1.5-3.5-fold increased risk of developing these cancers compared with normal-weight subjects, and it has been estimated that between 15 and 45% of these cancers can be attributed to overweight. More recent studies suggest that obesity may also increase the risk of other types of cancer, including pancreatic, hepatic and gallbladder cancer. (Ref: Obesity and cancer. Pischon T, Nthlings U, [employer] H. Proc Nutr Soc. 2008 May;67(2):128-45. doi: 10.1017/S0029665108006976.) The International Agency for Research on Cancer (IARC) has identified 13 cancers associated with overweight and obesity: meningioma, multiple myeloma, adenocarcinoma of the esophagus, and cancers of the thyroid, postmenopausal breast cancer, gallbladder, stomach, liver, pancreas, kidney, ovaries, uterus, colon and rectal (colorectal) cancers. 55 percent of  all cancers diagnosed in women and 24 percent of those diagnosed in men are associated with overweight and obesity.  Recommendation: At this point it is urgent that you take a step back and concentrate in loosing weight. Dedicate 100% of your efforts on this task. Nothing else will improve your health more than bringing your weight down and your BMI to less than 30. If you are here, you probably have chronic pain. We know that most chronic pain patients have difficulty exercising secondary to their pain. For this reason, you must rely on proper nutrition and diet in order to lose the weight. If your BMI is above 40, you should seriously consider bariatric surgery. A realistic goal is to lose 10% of your body weight over a period of 12 months.  Be honest to yourself, if over time you have unsuccessfully tried to lose weight, then it is time for you to seek professional help and to enter a medically supervised weight management program, and/or undergo bariatric surgery. Stop procrastinating.   Pain management considerations:  1.    Pharmacological Problems: Be advised that the use of opioid analgesics (oxycodone; hydrocodone; morphine; methadone; codeine; and all of their derivatives) have been associated with decreased metabolism and weight gain.  For this reason, should we see that you are unable to lose weight while taking these medications, it may become necessary for [place] to taper down and indefinitely discontinue them.  2.    Technical Problems: The incidence of successful interventional therapies decreases as the patient's BMI increases. It is much more difficult to accomplish a safe and effective interventional therapy on a patient with a BMI above 35. 3.    Radiation Exposure Problems: The x-rays machine, used to accomplish injection therapies, will automatically increase their x-ray output in order to capture an appropriate bone image. This means that radiation exposure increases exponentially with the  patient's BMI. (The higher the BMI, the higher the radiation exposure.) Although the level of radiation used at a given time is still safe to the patient, it is not for the physician and/or assisting staff. Unfortunately, radiation exposure is accumulative. Because physicians and the staff have to do procedures and be exposed on a daily basis, this can result in health problems such as  cancer and radiation burns. Radiation exposure to the staff is monitored by the radiation batches that they wear. The exposure levels are reported back to the staff on a quarterly basis. Depending on levels of exposure, physicians and staff may be obligated by law to decrease this exposure. This means that they have the right and obligation to refuse providing therapies where they may be overexposed to radiation. For this reason, physicians may decline to offer therapies such as radiofrequency ablation or implants to patients with a BMI above 40. 4.    Current Trends: Be advised that the current trend is to no longer offer certain therapies to patients with a BMI equal to, or above 35, due to increase perioperative risks, increased technical procedural difficulties, and excessive radiation exposure to healthcare personnel.  _________________________________________________________________________________________

## 2022-05-15 NOTE — Progress Notes (Signed)
Nursing Pain Medication Assessment:  Safety precautions to be maintained throughout the outpatient stay will include: orient to surroundings, keep bed in low position, maintain call bell within reach at all times, provide assistance with transfer out of bed and ambulation.  Medication Inspection Compliance: Pill count conducted under aseptic conditions, in front of the patient. Neither the pills nor the bottle was removed from the patient's sight at any time. Once count was completed pills were immediately returned to the patient in their original bottle.  Medication: Morphine IR Pill/Patch Count:  81 of 90 pills remain Pill/Patch Appearance: Markings consistent with prescribed medication Bottle Appearance: Standard pharmacy container. Clearly labeled. Filled Date: 05 / 02 / 2024 Last Medication intake:  Today

## 2022-09-04 ENCOUNTER — Encounter: Payer: Medicare HMO | Admitting: Pain Medicine

## 2022-09-05 NOTE — Progress Notes (Unsigned)
Patient: Tiffany Herring  Service Category: E/M  Provider: Oswaldo Done, MD  DOB: May 18, 1960  DOS: 09/06/2022  Location: Office  MRN: [id number]  Setting: Ambulatory outpatient  Referring Provider: Rolm Gala, MD  Type: Established Patient  Specialty: Interventional Pain Management  PCP: Rolm Gala, MD  Location: Remote location  Delivery: TeleHealth     Virtual Encounter - Pain Management PROVIDER NOTE: Information contained herein reflects review and annotations entered in association with encounter. Interpretation of such information and data should be left to medically-trained personnel. Information provided to patient can be located elsewhere in the medical record under "Patient Instructions". Document created using STT-dictation technology, any transcriptional errors that may result from process are unintentional.    Contact & Pharmacy Preferred: [phone] Home: [phone] (home) Mobile: [phone] (mobile) E-mail: [email or web address]  [name], [place] - [address] [place] [address] Phone: [phone] Fax: [phone]   Pre-screening  Ms. Ottum offered "in-person" vs "virtual" encounter. She indicated preferring virtual for this encounter.   Reason COVID-19*  Social distancing based on CDC and AMA recommendations.   I contacted Philemon Kingdom on 09/06/2022 via telephone.      I clearly identified myself as Oswaldo Done, MD. I verified that I was speaking with the correct person using two identifiers (Name: Louri Jurgenson, and date of birth: 02-Mar-1960).  Consent I sought verbal advanced consent from Philemon Kingdom for virtual visit interactions. I informed Ms. Laub of possible security and privacy concerns, risks, and limitations associated with providing "not-in-person" medical evaluation and management services. I also informed Ms. Ahles of the availability of "in-person" appointments. Finally, I informed her  that there would be a charge for the virtual visit and that she could be  personally, fully or partially, financially responsible for it. Ms. Watwood expressed understanding and agreed to proceed.   Historic Elements   Ms. Emmelina Liljenquist is a 62 y.o. year old, female patient evaluated today after our last contact on 05/15/2022. Ms. Washa  has a past medical history of Allergy, Arthritis, Asthma, Chronic kidney disease, Depression, Diabetes mellitus without complication (HCC), Hypertension, Multiple sclerosis (HCC), Neuromuscular disorder (HCC), and Sleep apnea. She also  has a past surgical history that includes Cesarean section (1991 and 1996). Ms. Meduna has a current medication list which includes the following prescription(s): albuterol, aspirin, b-d ins syr ultrafine 1cc/30g, fifty50 glucose meter 2.0, bupropion, cholecalciferol, clotrimazole, enalapril, farxiga, furosemide, gabapentin, blood glucose test strips, glucose blood, insulin glargine, accu-chek multiclix, latanoprost, mirtazapine, montelukast, [START ON 09/08/2022] morphine, oxybutynin, timolol, tizanidine, and cyanocobalamin. She  reports that she has quit smoking. She has never used smokeless tobacco. She reports that she does not drink alcohol and does not use drugs. Ms. Vanderkolk is allergic to amoxicillin, amoxicillin-pot clavulanate, copaxone  [glatiramer acetate], glatiramer, pseudoephedrine, rofecoxib, sulfa antibiotics, and meloxicam.  BMI: Estimated body mass index is 57.61 kg/m as calculated from the following:   Height as of 05/15/22: 5\' 2"  (1.575 m).   Weight as of 05/15/22: 315 lb (142.9 kg). Last encounter: 05/15/2022. Last procedure: Visit date not found.  HPI  Today, she is being contacted for medication management.  Patient unable to make her face-to-face appointment secondary to being hospitalized.  We will be sending a 30-day supply of medication to her pharmacy.  The patient indicates doing well with the current  medication regimen. No adverse reactions or side effects reported to the medications.   RTCB: 10/08/2022  Pharmacotherapy Assessment   Opioid Analgesic: MSIR 15 mg, 1 tab PO TID. "Drug Holiday" completed on 02/23/2021.  Micah Flesher from MME of 110 mg/day to 45 mg/day). MME/day: 45 mg/day   Monitoring: [place] PMP: PDMP reviewed during this encounter.       Pharmacotherapy: No side-effects or adverse reactions reported. Compliance: No problems identified. Effectiveness: Clinically acceptable. Plan: Refer to "POC". UDS:  Summary  Date Value Ref Range Status  06/15/2021 Note  Final    Comment:    ==================================================================== [employer] Select 13 (MW) ==================================================================== Test                             Result       Flag       Units  Drug Present and Declared for Prescription Verification   Morphine                       11584        EXPECTED   ng/mg creat   Normorphine                    189          EXPECTED   ng/mg creat    Potential sources of large amounts of morphine in the absence of    codeine include administration of morphine or use of heroin.     Normorphine is an expected metabolite of morphine.    Hydromorphone                  90           EXPECTED   ng/mg creat    Hydromorphone may be present as a metabolite of morphine;    concentrations of hydromorphone rarely exceed 5% of the morphine    concentration when this is the source of hydromorphone.  Drug Absent but Declared for Prescription Verification   Oxycodone                      Not Detected UNEXPECTED ng/mg creat ==================================================================== Test                      Result    Flag   Units      Ref Range   Creatinine              79               mg/dL      >=40 ==================================================================== Declared Medications:  The flagging and interpretation on this  report are based on the  following declared medications.  Unexpected results may arise from  inaccuracies in the declared medications.   **Note: The testing scope of this panel includes these medications:   Morphine (MSIR)  Oxycodone   **Note: The testing scope of this panel does not include the  following reported medications:   Albuterol (Ventolin HFA)  Aspirin  Bupropion (Wellbutrin)  Clotrimazole (Mycelex)  Cyanocobalamin  Enalapril (Vasotec)  Furosemide (Lasix)  Gabapentin (Neurontin)  Insulin (Novolin)  Mirtazapine (Remeron)  Montelukast (Singulair)  Naloxone (Narcan)  Oxybutynin (Ditropan)  Timolol (Timoptic)  Tizanidine (Zanaflex) ==================================================================== For clinical consultation, please call [phone]. ====================================================================    No results found for: "CBDTHCR", "D8THCCBX", "D9THCCBX"   Laboratory Chemistry Profile   Renal Lab Results  Component Value Date   BUN 18 02/06/2018   CREATININE 1.22 (H) 02/06/2018   BCR  15 02/06/2018   GFRAA 56 (L) 02/06/2018   GFRNONAA 49 (L) 02/06/2018    Hepatic Lab Results  Component Value Date   AST 25 02/06/2018   ALT 19 02/15/2015   ALBUMIN 4.6 02/06/2018   ALKPHOS 97 02/06/2018    Electrolytes Lab Results  Component Value Date   NA 136 02/06/2018   K 4.9 02/06/2018   CL 95 (L) 02/06/2018   CALCIUM 9.8 02/06/2018   MG 1.8 02/06/2018    Bone Lab Results  Component Value Date   25OHVITD1 55 02/06/2018   25OHVITD2 2.9 02/06/2018   25OHVITD3 52 02/06/2018    Inflammation (CRP: Acute Phase) (ESR: Chronic Phase) Lab Results  Component Value Date   CRP 17 (H) 02/06/2018   ESRSEDRATE 28 02/06/2018         Note: Above Lab results reviewed.  Imaging  US RENAL CLINICAL DATA:  Chronic renal disease  EXAM: RENAL / URINARY TRACT ULTRASOUND COMPLETE  COMPARISON:  None Available.  FINDINGS: Right  Kidney:  Renal measurements: 10.3 x 4.9 x 5.4 cm = volume: 143 mL. Diffuse increased echogenicity.  Left Kidney:  Renal measurements: 9.9 x 4.6 x 5.2 cm = volume: 125 mL. Diffuse increased echogenicity.  Bladder:  Appears normal for degree of bladder distention.  Other:  None.  IMPRESSION: Diffuse increased echogenicity in both kidneys is consistent with medical renal disease. No other abnormalities.  Electronically Signed   By: Gerome Sam III M.D.   On: 11/25/2021 14:49  Assessment  The primary encounter diagnosis was Chronic pain syndrome. Diagnoses of Chronic low back pain (Bilateral) w/ sciatica (Right), Chronic lower extremity pain (Right), Failed back surgical syndrome, DDD (degenerative disc disease), lumbosacral, Generalized osteoarthritis of multiple sites, Multiple sclerosis (HCC), Chronic sacroiliac joint pain (bilateral), Lumbar facet syndrome (Bilateral), Chronic hip pain (Left), Pharmacologic therapy, Chronic use of opiate for therapeutic purpose, Encounter for medication management, and Encounter for chronic pain management were also pertinent to this visit.  Plan of Care  Problem-specific:  No problem-specific Assessment & Plan notes found for this encounter.  Ms. Kenzli Cornella has a current medication list which includes the following long-term medication(s): albuterol, bupropion, furosemide, gabapentin, insulin glargine, mirtazapine, montelukast, and [START ON 09/08/2022] morphine.  Pharmacotherapy (Medications Ordered): Meds ordered this encounter  Medications   morphine (MSIR) 15 MG tablet    Sig: Take 1 tablet (15 mg total) by mouth every 8 (eight) hours. Must last 30 days.    Dispense:  90 tablet    Refill:  0    DO NOT: delete (not duplicate); no partial-fill (will deny script to complete), no refill request (F/U required). DISPENSE: 1 day early if closed on fill date. WARN: No CNS-depressants within 8 hrs of med.   Orders:  No orders of the  defined types were placed in this encounter.  Follow-up plan:   Return in about 1 month (around 10/08/2022) for Eval-day (M,W), (F2F), (MM).      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 Total daily dose brought down from 110 MME/day to 60. Oxycodone (ineffective).      Recent Visits No visits were found meeting these conditions. Showing recent visits within past  90 days and meeting all other requirements Today's Visits Date Type Provider Dept  09/06/22 Office Visit Delano Metz, MD [facility]  Showing today's visits and meeting all other requirements Future Appointments No visits were found meeting these conditions. Showing future appointments within next 90 days and meeting all other requirements  I discussed the assessment and treatment plan with the patient. The patient was provided an opportunity to ask questions and all were answered. The patient agreed with the plan and demonstrated an understanding of the instructions.  Patient advised to call back or seek an in-person evaluation if the symptoms or condition worsens.  Duration of encounter: 12 minutes.  Note by: Oswaldo Done, MD Date: 09/06/2022; Time: 2:49 PM

## 2022-09-06 ENCOUNTER — Ambulatory Visit: Payer: Medicare HMO | Attending: Pain Medicine | Admitting: Pain Medicine

## 2022-09-06 DIAGNOSIS — G894 Chronic pain syndrome: Secondary | ICD-10-CM | POA: Diagnosis not present

## 2022-09-06 DIAGNOSIS — M79604 Pain in right leg: Secondary | ICD-10-CM | POA: Diagnosis not present

## 2022-09-06 DIAGNOSIS — M533 Sacrococcygeal disorders, not elsewhere classified: Secondary | ICD-10-CM

## 2022-09-06 DIAGNOSIS — M961 Postlaminectomy syndrome, not elsewhere classified: Secondary | ICD-10-CM | POA: Diagnosis not present

## 2022-09-06 DIAGNOSIS — Z79891 Long term (current) use of opiate analgesic: Secondary | ICD-10-CM

## 2022-09-06 DIAGNOSIS — M25552 Pain in left hip: Secondary | ICD-10-CM

## 2022-09-06 DIAGNOSIS — M5441 Lumbago with sciatica, right side: Secondary | ICD-10-CM

## 2022-09-06 DIAGNOSIS — G8929 Other chronic pain: Secondary | ICD-10-CM

## 2022-09-06 DIAGNOSIS — Z79899 Other long term (current) drug therapy: Secondary | ICD-10-CM

## 2022-09-06 DIAGNOSIS — M47816 Spondylosis without myelopathy or radiculopathy, lumbar region: Secondary | ICD-10-CM

## 2022-09-06 DIAGNOSIS — M159 Polyosteoarthritis, unspecified: Secondary | ICD-10-CM

## 2022-09-06 DIAGNOSIS — G35 Multiple sclerosis: Secondary | ICD-10-CM

## 2022-09-06 DIAGNOSIS — M5137 Other intervertebral disc degeneration, lumbosacral region: Secondary | ICD-10-CM

## 2022-09-06 MED ORDER — MORPHINE SULFATE 15 MG PO TABS
15.0000 mg | ORAL_TABLET | Freq: Three times a day (TID) | ORAL | 0 refills | Status: DC
Start: 2022-09-08 — End: 2022-10-01

## 2022-09-06 NOTE — Patient Instructions (Signed)
 ____________________________________________________________________________________________  Opioid Pain Medication Update  To: All patients taking opioid pain medications. (I.e.: hydrocodone, hydromorphone, oxycodone, oxymorphone, morphine, codeine, methadone, tapentadol, tramadol, buprenorphine, fentanyl, etc.)  Re: Updated review of side effects and adverse reactions of opioid analgesics, as well as new information about long term effects of this class of medications.  Direct risks of long-term opioid therapy are not limited to opioid addiction and overdose. Potential medical risks include serious fractures, breathing problems during sleep, hyperalgesia, immunosuppression, chronic constipation, bowel obstruction, myocardial infarction, and tooth decay secondary to xerostomia.  Unpredictable adverse effects that can occur even if you take your medication correctly: Cognitive impairment, respiratory depression, and death. Most people think that if they take their medication "correctly", and "as instructed", that they will be safe. Nothing could be farther from the truth. In reality, a significant amount of recorded deaths associated with the use of opioids has occurred in individuals that had taken the medication for a long time, and were taking their medication correctly. The following are examples of how this can happen: Patient taking his/her medication for a long time, as instructed, without any side effects, is given a certain antibiotic or another unrelated medication, which in turn triggers a "Drug-to-drug interaction" leading to disorientation, cognitive impairment, impaired reflexes, respiratory depression or an untoward event leading to serious bodily harm or injury, including death.  Patient taking his/her medication for a long time, as instructed, without any side effects, develops an acute impairment of liver and/or kidney function. This will lead to a rapid inability of the body to  breakdown and eliminate their pain medication, which will result in effects similar to an "overdose", but with the same medicine and dose that they had always taken. This again may lead to disorientation, cognitive impairment, impaired reflexes, respiratory depression or an untoward event leading to serious bodily harm or injury, including death.  A similar problem will occur with patients as they grow older and their liver and kidney function begins to decrease as part of the aging process.  Background information: Historically, the original case for using long-term opioid therapy to treat chronic noncancer pain was based on safety assumptions that subsequent experience has called into question. In 1996, the American Pain Society and the American Academy of Pain Medicine issued a consensus statement supporting long-term opioid therapy. This statement acknowledged the dangers of opioid prescribing but concluded that the risk for addiction was low; respiratory depression induced by opioids was short-lived, occurred mainly in opioid-naive patients, and was antagonized by pain; tolerance was not a common problem; and efforts to control diversion should not constrain opioid prescribing. This has now proven to be wrong. Experience regarding the risks for opioid addiction, misuse, and overdose in community practice has failed to support these assumptions.  According to the Centers for Disease Control and Prevention, fatal overdoses involving opioid analgesics have increased sharply over the past decade. Currently, more than 96,700 people die from drug overdoses every year. Opioids are a factor in 7 out of every 10 overdose deaths. Deaths from drug overdose have surpassed motor vehicle accidents as the leading cause of death for individuals between the ages of 80 and 61.  Clinical data suggest that neuroendocrine dysfunction may be very common in both men and women, potentially causing hypogonadism, erectile  dysfunction, infertility, decreased libido, osteoporosis, and depression. Recent studies linked higher opioid dose to increased opioid-related mortality. Controlled observational studies reported that long-term opioid therapy may be associated with increased risk for cardiovascular events. Subsequent meta-analysis concluded  that the safety of long-term opioid therapy in elderly patients has not been proven.   Side Effects and adverse reactions: Common side effects: Drowsiness (sedation). Dizziness. Nausea and vomiting. Constipation. Physical dependence -- Dependence often manifests with withdrawal symptoms when opioids are discontinued or decreased. Tolerance -- As you take repeated doses of opioids, you require increased medication to experience the same effect of pain relief. Respiratory depression -- This can occur in healthy people, especially with higher doses. However, people with COPD, asthma or other lung conditions may be even more susceptible to fatal respiratory impairment.  Uncommon side effects: An increased sensitivity to feeling pain and extreme response to pain (hyperalgesia). Chronic use of opioids can lead to this. Delayed gastric emptying (the process by which the contents of your stomach are moved into your small intestine). Muscle rigidity. Immune system and hormonal dysfunction. Quick, involuntary muscle jerks (myoclonus). Arrhythmia. Itchy skin (pruritus). Dry mouth (xerostomia).  Long-term side effects: Chronic constipation. Sleep-disordered breathing (SDB). Increased risk of bone fractures. Hypothalamic-pituitary-adrenal dysregulation. Increased risk of overdose.  RISKS: Respiratory depression and death: Opioids increase the risk of respiratory depression and death.  Drug-to-drug interactions: Opioids are relatively contraindicated in combination with benzodiazepines, sleep inducers, and other central nervous system depressants. Other classes of medications  (i.e.: certain antibiotics and even over-the-counter medications) may also trigger or induce respiratory depression in some patients.  Medical conditions: Patients with pre-existing respiratory problems are at higher risk of respiratory failure and/or depression when in combination with opioid analgesics. Opioids are relatively contraindicated in some medical conditions such as central sleep apnea.   Fractures and Falls:  Opioids increase the risk and incidence of falls. This is of particular importance in elderly patients.  Endocrine System:  Long-term administration is associated with endocrine abnormalities (endocrinopathies). (Also known as Opioid-induced Endocrinopathy) Influences on both the hypothalamic-pituitary-adrenal axis?and the hypothalamic-pituitary-gonadal axis have been demonstrated with consequent hypogonadism and adrenal insufficiency in both sexes. Hypogonadism and decreased levels of dehydroepiandrosterone sulfate have been reported in men and women. Endocrine effects include: Amenorrhoea in women (abnormal absence of menstruation) Reduced libido in both sexes Decreased sexual function Erectile dysfunction in men Hypogonadisms (decreased testicular function with shrinkage of testicles) Infertility Depression and fatigue Loss of muscle mass Anxiety Depression Immune suppression Hyperalgesia Weight gain Anemia Osteoporosis Patients (particularly women of childbearing age) should avoid opioids. There is insufficient evidence to recommend routine monitoring of asymptomatic patients taking opioids in the long-term for hormonal deficiencies.  Immune System: Human studies have demonstrated that opioids have an immunomodulating effect. These effects are mediated via opioid receptors both on immune effector cells and in the central nervous system. Opioids have been demonstrated to have adverse effects on antimicrobial response and anti-tumour surveillance. Buprenorphine has  been demonstrated to have no impact on immune function.  Opioid Induced Hyperalgesia: Human studies have demonstrated that prolonged use of opioids can lead to a state of abnormal pain sensitivity, sometimes called opioid induced hyperalgesia (OIH). Opioid induced hyperalgesia is not usually seen in the absence of tolerance to opioid analgesia. Clinically, hyperalgesia may be diagnosed if the patient on long-term opioid therapy presents with increased pain. This might be qualitatively and anatomically distinct from pain related to disease progression or to breakthrough pain resulting from development of opioid tolerance. Pain associated with hyperalgesia tends to be more diffuse than the pre-existing pain and less defined in quality. Management of opioid induced hyperalgesia requires opioid dose reduction.  Cancer: Chronic opioid therapy has been associated with an increased risk of cancer  among noncancer patients with chronic pain. This association was more evident in chronic strong opioid users. Chronic opioid consumption causes significant pathological changes in the small intestine and colon. Epidemiological studies have found that there is a link between opium dependence and initiation of gastrointestinal cancers. Cancer is the second leading cause of death after cardiovascular disease. Chronic use of opioids can cause multiple conditions such as GERD, immunosuppression and renal damage as well as carcinogenic effects, which are associated with the incidence of cancers.   Mortality: Long-term opioid use has been associated with increased mortality among patients with chronic non-cancer pain (CNCP).  Prescription of long-acting opioids for chronic noncancer pain was associated with a significantly increased risk of all-cause mortality, including deaths from causes other than overdose.  Reference: Von Korff M, Kolodny A, Deyo RA, Chou R. Long-term opioid therapy reconsidered. Ann Intern Med. 2011  Sep 6;155(5):325-8. doi: 10.7326/[id number]. PMID: 64403474; PMCID: [id number]. Randon Goldsmith, Hayward RA, Dunn KM, [place] KP. Risk of adverse events in patients prescribed long-term opioids: A cohort study in the [place] [facility] Research Datalink. Eur J Pain. 2019 May;23(5):908-922. doi: 10.1002/ejp.1357. Epub 2019 Jan 31. PMID: 43329518. Colameco S, Coren JS, Ciervo CA. Continuous opioid treatment for chronic noncancer pain: a time for moderation in prescribing. Postgrad Med. 2009 Jul;121(4):61-6. doi: 10.3810/pgm.2009.07.2032. PMID: 84166063. William Hamburger RN, [place] SD, Blazina I, Cristopher Peru, Bougatsos C, Deyo RA. The effectiveness and risks of long-term opioid therapy for chronic pain: a systematic review for a [employer] of [employer] Pathways to [employer]. Ann Intern Med. 2015 Feb 17;162(4):276-86. doi: 10.7326/M14-2559. PMID: 01601093. Caryl Bis [facility], Makuc DM. NCHS Data Brief No. 22. [place]: Centers for Disease Control and Prevention; 2009. Sep, Increase in Fatal Poisonings Involving Opioid Analgesics in the [place], 1999-2006. Song IA, Choi HR, Oh TK. Long-term opioid use and mortality in patients with chronic non-cancer pain: Ten-year follow-up study in [place] from 2010 through 2019. EClinicalMedicine. 2022 Jul 18;51:101558. doi: 10.1016/j.eclinm.2022.235573. PMID: 22025427; PMCID: [id number]. Huser, W., Schubert, T., Vogelmann, T. et al. All-cause mortality in patients with long-term opioid therapy compared with non-opioid analgesics for chronic non-cancer pain: a database study. BMC Med 18, 162 (2020). http://lester.info/ Rashidian H, Karie Kirks, Malekzadeh R, Haghdoost AA. An Ecological Study of the Association between Opiate Use and Incidence of Cancers. Addict Health. 2016 Fall;8(4):252-260. PMID: 51761607; PMCID: [id number].  Our Goal: Our goal is to control your  pain with means other than the use of opioid pain medications.  Our Recommendation: Talk to your physician about coming off of these medications. We can assist you with the tapering down and stopping these medicines. Based on the new information, even if you cannot completely stop the medication, a decrease in the dose may be associated with a lesser risk. Ask for other means of controlling the pain. Decrease or eliminate those factors that significantly contribute to your pain such as smoking, obesity, and a diet heavily tilted towards "inflammatory" nutrients.  Last Updated: 07/17/2022   ____________________________________________________________________________________________     ____________________________________________________________________________________________  National Pain Medication Shortage  The [place] is experiencing worsening drug shortages. These have had a negative widespread effect on patient care and treatment. Not expected to improve any time soon. Predicted to last past 2029.   Drug shortage list (generic names) Oxycodone IR Oxycodone/APAP Oxymorphone IR Hydromorphone Hydrocodone/APAP Morphine  Where is the problem?  Manufacturing and supply level.  Will this shortage affect you?  Only if you  take any of the above pain medications.  How? You may be unable to fill your prescription.  Your pharmacist may offer a "partial fill" of your prescription. (Warning: Do not accept partial fills.) Prescriptions partially filled cannot be transferred to another pharmacy. Read our Medication Rules and Regulation. Depending on how much medicine you are dependent on, you may experience withdrawals when unable to get the medication.  Recommendations: Consider ending your dependence on opioid pain medications. Ask your pain specialist to assist you with the process. Consider switching to a medication currently not in shortage, such as Buprenorphine. Talk to your pain  specialist about this option. Consider decreasing your pain medication requirements by managing tolerance thru "Drug Holidays". This may help minimize withdrawals, should you run out of medicine. Control your pain thru the use of non-pharmacological interventional therapies.   Your prescriber: Prescribers cannot be blamed for shortages. Medication manufacturing and supply issues cannot be fixed by the prescriber.   NOTE: The prescriber is not responsible for supplying the medication, or solving supply issues. Work with your pharmacist to solve it. The patient is responsible for the decision to take or continue taking the medication and for identifying and securing a legal supply source. By law, supplying the medication is the job and responsibility of the pharmacy. The prescriber is responsible for the evaluation, monitoring, and prescribing of these medications.   Prescribers will NOT: Re-issue prescriptions that have been partially filled. Re-issue prescriptions already sent to a pharmacy.  Re-send prescriptions to a different pharmacy because yours did not have your medication. Ask pharmacist to order more medicine or transfer the prescription to another pharmacy. (Read below.)  New 2023 regulation: "September 09, 2021 Revised Regulation Allows DEA-Registered Pharmacies to Transfer Electronic Prescriptions at a Patient's Request DEA Headquarters Division - Public Information Office Patients now have the ability to request their electronic prescription be transferred to another pharmacy without having to go back to their practitioner to initiate the request. This revised regulation went into effect on Monday, September 05, 2021.     At a patient's request, a DEA-registered retail pharmacy can now transfer an electronic prescription for a controlled substance (schedules II-V) to another DEA-registered retail pharmacy. Prior to this change, patients would have to go through their practitioner to  cancel their prescription and have it re-issued to a different pharmacy. The process was taxing and time consuming for both patients and practitioners.    The Drug Enforcement Administration [facility]) published its intent to revise the process for transferring electronic prescriptions on November 28, 2019.  The final rule was published in the federal register on August 04, 2021 and went into effect 30 days later.  Under the final rule, a prescription can only be transferred once between pharmacies, and only if allowed under existing state or other applicable law. The prescription must remain in its electronic form; may not be altered in any way; and the transfer must be communicated directly between two licensed pharmacists. It's important to note, any authorized refills transfer with the original prescription, which means the entire prescription will be filled at the same pharmacy".  Reference: HugeHand.is [facility] website announcement)  [email or web address] [employer] of Justice)   [employer] / Vol. 88, No. 143 / Thursday, August 04, 2021 / Rules and Regulations DEPARTMENT OF JUSTICE  Drug Enforcement Administration  21 CFR Part 1306  [Docket No. [id number]]  RIN [id number] Transfer of Electronic Prescriptions for Schedules II-V Controlled Substances Between Pharmacies for Initial Filling  ____________________________________________________________________________________________  ____________________________________________________________________________________________  Transfer of Pain Medication between Pharmacies  Re: 2023 DEA Clarification on existing regulation  Published on DEA Website: September 09, 2021  Title: Revised Regulation Allows DEA-Registered Pharmacies to Electrical engineer Prescriptions at a Patient's  Request DEA Headquarters Division - [employer]  "Patients now have the ability to request their electronic prescription be transferred to another pharmacy without having to go back to their practitioner to initiate the request. This revised regulation went into effect on Monday, September 05, 2021.     At a patient's request, a DEA-registered retail pharmacy can now transfer an electronic prescription for a controlled substance (schedules II-V) to another DEA-registered retail pharmacy. Prior to this change, patients would have to go through their practitioner to cancel their prescription and have it re-issued to a different pharmacy. The process was taxing and time consuming for both patients and practitioners.    The Drug Enforcement Administration [facility]) published its intent to revise the process for transferring electronic prescriptions on November 28, 2019.  The final rule was published in the federal register on August 04, 2021 and went into effect 30 days later.  Under the final rule, a prescription can only be transferred once between pharmacies, and only if allowed under existing state or other applicable law. The prescription must remain in its electronic form; may not be altered in any way; and the transfer must be communicated directly between two licensed pharmacists. It's important to note, any authorized refills transfer with the original prescription, which means the entire prescription will be filled at the same pharmacy."    REFERENCES: 1. DEA website announcement [email or web address]  2. Department of Justice website  [email or web address].pdf  3. DEPARTMENT OF JUSTICE Drug Enforcement Administration 21 CFR Part 1306 [Docket No. [id number]] RIN [id number] "Transfer of Electronic Prescriptions for Schedules II-V Controlled Substances  Between Pharmacies for Initial Filling"  ____________________________________________________________________________________________     _______________________________________________________________________  Medication Rules  Purpose: To inform patients, and their family members, of our medication rules and regulations.  Applies to: All patients receiving prescriptions from our practice (written or electronic).  Pharmacy of record: This is the pharmacy where your electronic prescriptions will be sent. Make sure we have the correct one.  Electronic prescriptions: In compliance with the [facility] Strengthen Opioid Misuse Prevention (STOP) Act of 2017 (Session Conni Elliot [phone]), effective January 09, 2018, all controlled substances must be electronically prescribed. Written prescriptions, faxing, or calling prescriptions to a pharmacy will no longer be done.  Prescription refills: These will be provided only during in-person appointments. No medications will be renewed without a "face-to-face" evaluation with your provider. Applies to all prescriptions.  NOTE: The following applies primarily to controlled substances (Opioid* Pain Medications).   Type of encounter (visit): For patients receiving controlled substances, face-to-face visits are required. (Not an option and not up to the patient.)  Patient's responsibilities: Pain Pills: Bring all pain pills to every appointment (except for procedure appointments). Pill Bottles: Bring pills in original pharmacy bottle. Bring bottle, even if empty. Always bring the bottle of the most recent fill.  Medication refills: You are responsible for knowing and keeping track of what medications you are taking and when is it that you will need a refill. The day before your appointment: write a list of all prescriptions that need to be refilled. The day of the appointment: give the list to the admitting nurse. Prescriptions will be written only  during appointments. No prescriptions will be written on procedure days. If you forget a  medication: it will not be "Called in", "Faxed", or "electronically sent". You will need to get another appointment to get these prescribed. No early refills. Do not call asking to have your prescription filled early. Partial  or short prescriptions: Occasionally your pharmacy may not have enough pills to fill your prescription.  NEVER ACCEPT a partial fill or a prescription that is short of the total amount of pills that you were prescribed.  With controlled substances the law allows 72 hours for the pharmacy to complete the prescription.  If the prescription is not completed within 72 hours, the pharmacist will require a new prescription to be written. This means that you will be short on your medicine and we WILL NOT send another prescription to complete your original prescription.  Instead, request the pharmacy to send a carrier to a nearby branch to get enough medication to provide you with your full prescription. Prescription Accuracy: You are responsible for carefully inspecting your prescriptions before leaving our office. Have the discharge nurse carefully go over each prescription with you, before taking them home. Make sure that your name is accurately spelled, that your address is correct. Check the name and dose of your medication to make sure it is accurate. Check the number of pills, and the written instructions to make sure they are clear and accurate. Make sure that you are given enough medication to last until your next medication refill appointment. Taking Medication: Take medication as prescribed. When it comes to controlled substances, taking less pills or less frequently than prescribed is permitted and encouraged. Never take more pills than instructed. Never take the medication more frequently than prescribed.  Inform other Doctors: Always inform, all of your healthcare providers, of all the  medications you take. Pain Medication from other Providers: You are not allowed to accept any additional pain medication from any other Doctor or Healthcare provider. There are two exceptions to this rule. (see below) In the event that you require additional pain medication, you are responsible for notifying us, as stated below. Cough Medicine: Often these contain an opioid, such as codeine or hydrocodone. Never accept or take cough medicine containing these opioids if you are already taking an opioid* medication. The combination may cause respiratory failure and death. Medication Agreement: You are responsible for carefully reading and following our Medication Agreement. This must be signed before receiving any prescriptions from our practice. Safely store a copy of your signed Agreement. Violations to the Agreement will result in no further prescriptions. (Additional copies of our Medication Agreement are available upon request.) Laws, Rules, & Regulations: All patients are expected to follow all [address] and [employer], [employer], Rules, [place] [place]. Ignorance of the Laws does not constitute a valid excuse.  Illegal drugs and Controlled Substances: The use of illegal substances (including, but not limited to marijuana and its derivatives) and/or the illegal use of any controlled substances is strictly prohibited. Violation of this rule may result in the immediate and permanent discontinuation of any and all prescriptions being written by our practice. The use of any illegal substances is prohibited. Adopted CDC guidelines & recommendations: Target dosing levels will be at or below 60 MME/day. Use of benzodiazepines** is not recommended.  Exceptions: There are only two exceptions to the rule of not receiving pain medications from other Healthcare Providers. Exception #1 (Emergencies): In the event of an emergency (i.e.: accident requiring emergency care), you are allowed to receive additional pain  medication. However, you are responsible for: As soon as  you are able, call our office [phone], at any time of the day or night, and leave a message stating your name, the date and nature of the emergency, and the name and dose of the medication prescribed. In the event that your call is answered by a member of our staff, make sure to document and save the date, time, and the name of the person that took your information.  Exception #2 (Planned Surgery): In the event that you are scheduled by another doctor or dentist to have any type of surgery or procedure, you are allowed (for a period no longer than 30 days), to receive additional pain medication, for the acute post-op pain. However, in this case, you are responsible for picking up a copy of our "Post-op Pain Management for Surgeons" handout, and giving it to your surgeon or dentist. This document is available at our office, and does not require an appointment to obtain it. Simply go to our office during business hours (Monday-Thursday from 8:00 AM to 4:00 PM) (Friday 8:00 AM to 12:00 Noon) or if you have a scheduled appointment with [place], prior to your surgery, and ask for it by name. In addition, you are responsible for: calling our office [phone], at any time of the day or night, and leaving a message stating your name, name of your surgeon, type of surgery, and date of procedure or surgery. Failure to comply with your responsibilities may result in termination of therapy involving the controlled substances. Medication Agreement Violation. Following the above rules, including your responsibilities will help you in avoiding a Medication Agreement Violation ("Breaking your Pain Medication Contract").  Consequences:  Not following the above rules may result in permanent discontinuation of medication prescription therapy.  *Opioid medications include: morphine, codeine, oxycodone, oxymorphone, hydrocodone, hydromorphone, meperidine, tramadol,  tapentadol, buprenorphine, fentanyl, methadone. **Benzodiazepine medications include: diazepam (Valium), alprazolam (Xanax), clonazepam (Klonopine), lorazepam (Ativan), clorazepate (Tranxene), chlordiazepoxide (Librium), estazolam (Prosom), oxazepam (Serax), temazepam (Restoril), triazolam (Halcion) (Last updated: 11/01/2021) ______________________________________________________________________    ______________________________________________________________________  Medication Recommendations and Reminders  Applies to: All patients receiving prescriptions (written and/or electronic).  Medication Rules & Regulations: You are responsible for reading, knowing, and following our "Medication Rules" document. These exist for your safety and that of others. They are not flexible and neither are we. Dismissing or ignoring them is an act of "non-compliance" that may result in complete and irreversible termination of such medication therapy. For safety reasons, "non-compliance" will not be tolerated. As with the [place] fundamental legal principle of "ignorance of the law is no defense", we will accept no excuses for not having read and knowing the content of documents provided to you by our practice.  Pharmacy of record:  Definition: This is the pharmacy where your electronic prescriptions will be sent.  We do not endorse any particular pharmacy. It is up to you and your insurance to decide what pharmacy to use.  We do not restrict you in your choice of pharmacy. However, once we write for your prescriptions, we will NOT be re-sending more prescriptions to fix restricted supply problems created by your pharmacy, or your insurance.  The pharmacy listed in the electronic medical record should be the one where you want electronic prescriptions to be sent. If you choose to change pharmacy, simply notify our nursing staff. Changes will be made only during your regular appointments and not over the  phone.  Recommendations: Keep all of your pain medications in a safe place, under lock and key, even  if you live alone. We will NOT replace lost, stolen, or damaged medication. We do not accept "Police Reports" as proof of medications having been stolen. After you fill your prescription, take 1 week's worth of pills and put them away in a safe place. You should keep a separate, properly labeled bottle for this purpose. The remainder should be kept in the original bottle. Use this as your primary supply, until it runs out. Once it's gone, then you know that you have 1 week's worth of medicine, and it is time to come in for a prescription refill. If you do this correctly, it is unlikely that you will ever run out of medicine. To make sure that the above recommendation works, it is very important that you make sure your medication refill appointments are scheduled at least 1 week before you run out of medicine. To do this in an effective manner, make sure that you do not leave the office without scheduling your next medication management appointment. Always ask the nursing staff to show you in your prescription , when your medication will be running out. Then arrange for the receptionist to get you a return appointment, at least 7 days before you run out of medicine. Do not wait until you have 1 or 2 pills left, to come in. This is very poor planning and does not take into consideration that we may need to cancel appointments due to bad weather, sickness, or emergencies affecting our staff. DO NOT ACCEPT A "Partial Fill": If for any reason your pharmacy does not have enough pills/tablets to completely fill or refill your prescription, do not allow for a "partial fill". The law allows the pharmacy to complete that prescription within 72 hours, without requiring a new prescription. If they do not fill the rest of your prescription within those 72 hours, you will need a separate prescription to fill the remaining  amount, which we will NOT provide. If the reason for the partial fill is your insurance, you will need to talk to the pharmacist about payment alternatives for the remaining tablets, but again, DO NOT ACCEPT A PARTIAL FILL, unless you can trust your pharmacist to obtain the remainder of the pills within 72 hours.  Prescription refills and/or changes in medication(s):  Prescription refills, and/or changes in dose or medication, will be conducted only during scheduled medication management appointments. (Applies to both, written and electronic prescriptions.) No refills on procedure days. No medication will be changed or started on procedure days. No changes, adjustments, and/or refills will be conducted on a procedure day. Doing so will interfere with the diagnostic portion of the procedure. No phone refills. No medications will be "called into the pharmacy". No Fax refills. No weekend refills. No Holliday refills. No after hours refills.  Remember:  Business hours are:  Monday to Thursday 8:00 AM to 4:00 PM Provider's Schedule: Delano Metz, MD - Appointments are:  Medication management: Monday and Wednesday 8:00 AM to 4:00 PM Procedure day: Tuesday and Thursday 7:30 AM to 4:00 PM Edward Jolly, MD - Appointments are:  Medication management: Tuesday and Thursday 8:00 AM to 4:00 PM Procedure day: Monday and Wednesday 7:30 AM to 4:00 PM (Last update: 11/01/2021) ______________________________________________________________________   ____________________________________________________________________________________________  Naloxone Nasal Spray  Why am I receiving this medication? [place] STOP ACT requires that all patients taking high dose opioids or at risk of opioids respiratory depression, be prescribed an opioid reversal agent, such as Naloxone (AKA: Narcan).  What is this medication? NALOXONE (  nal OX one) treats opioid overdose, which causes slow or shallow breathing,  severe drowsiness, or trouble staying awake. Call emergency services after using this medication. You may need additional treatment. Naloxone works by reversing the effects of opioids. It belongs to a group of medications called opioid blockers.  COMMON BRAND NAME(S): Kloxxado, Narcan  What should I tell my care team before I take this medication? They need to know if you have any of these conditions: Heart disease Substance use disorder An unusual or allergic reaction to naloxone, other medications, foods, dyes, or preservatives Pregnant or trying to get pregnant Breast-feeding  When to use this medication? This medication is to be used for the treatment of respiratory depression (less than 8 breaths per minute) secondary to opioid overdose.   How to use this medication? This medication is for use in the nose. Lay the person on their back. Support their neck with your hand and allow the head to tilt back before giving the medication. The nasal spray should be given into 1 nostril. After giving the medication, move the person onto their side. Do not remove or test the nasal spray until ready to use. Get emergency medical help right away after giving the first dose of this medication, even if the person wakes up. You should be familiar with how to recognize the signs and symptoms of a narcotic overdose. If more doses are needed, give the additional dose in the other nostril. Talk to your care team about the use of this medication in children. While this medication may be prescribed for children as young as newborns for selected conditions, precautions do apply.  Naloxone Overdosage: If you think you have taken too much of this medicine contact a poison control center or emergency room at once.  NOTE: This medicine is only for you. Do not share this medicine with others.  What if I miss a dose? This does not apply.  What may interact with this medication? This is only used during an  emergency. No interactions are expected during emergency use. This list may not describe all possible interactions. Give your health care provider a list of all the medicines, herbs, non-prescription drugs, or dietary supplements you use. Also tell them if you smoke, drink alcohol, or use illegal drugs. Some items may interact with your medicine.  What should I watch for while using this medication? Keep this medication ready for use in the case of an opioid overdose. Make sure that you have the phone number of your care team and local hospital ready. You may need to have additional doses of this medication. Each nasal spray contains a single dose. Some emergencies may require additional doses. After use, bring the treated person to the nearest hospital or call 911. Make sure the treating care team knows that the person has received a dose of this medication. You will receive additional instructions on what to do during and after use of this medication before an emergency occurs.  What side effects may I notice from receiving this medication? Side effects that you should report to your care team as soon as possible: Allergic reactions--skin rash, itching, hives, swelling of the face, lips, tongue, or throat Side effects that usually do not require medical attention (report these to your care team if they continue or are bothersome): Constipation Dryness or irritation inside the nose Headache Increase in blood pressure Muscle spasms Stuffy nose Toothache This list may not describe all possible side effects. Call your doctor for  medical advice about side effects. You may report side effects to FDA at 1-800-FDA-1088.  Where should I keep my medication? Because this is an emergency medication, you should keep it with you at all times.  Keep out of the reach of children and pets. Store between 20 and 25 degrees C (68 and 77 degrees F). Do not freeze. Throw away any unused medication after the  expiration date. Keep in original box until ready to use.  NOTE: This sheet is a summary. It may not cover all possible information. If you have questions about this medicine, talk to your doctor, pharmacist, or health care provider.   2023 [employer]/Gold Standard (2020-09-03 00:00:00)  ____________________________________________________________________________________________

## 2022-10-01 NOTE — Patient Instructions (Signed)
____________________________________________________________________________________________  Opioid Pain Medication Update  To: All patients taking opioid pain medications. (I.e.: hydrocodone, hydromorphone, oxycodone, oxymorphone, morphine, codeine, methadone, tapentadol, tramadol, buprenorphine, fentanyl, etc.)  Re: Updated review of side effects and adverse reactions of opioid analgesics, as well as new information about long term effects of this class of medications.  Direct risks of long-term opioid therapy are not limited to opioid addiction and overdose. Potential medical risks include serious fractures, breathing problems during sleep, hyperalgesia, immunosuppression, chronic constipation, bowel obstruction, myocardial infarction, and tooth decay secondary to xerostomia.  Unpredictable adverse effects that can occur even if you take your medication correctly: Cognitive impairment, respiratory depression, and death. Most people think that if they take their medication "correctly", and "as instructed", that they will be safe. Nothing could be farther from the truth. In reality, a significant amount of recorded deaths associated with the use of opioids has occurred in individuals that had taken the medication for a long time, and were taking their medication correctly. The following are examples of how this can happen: Patient taking his/her medication for a long time, as instructed, without any side effects, is given a certain antibiotic or another unrelated medication, which in turn triggers a "Drug-to-drug interaction" leading to disorientation, cognitive impairment, impaired reflexes, respiratory depression or an untoward event leading to serious bodily harm or injury, including death.  Patient taking his/her medication for a long time, as instructed, without any side effects, develops an acute impairment of liver and/or kidney function. This will lead to a rapid inability of the body to  breakdown and eliminate their pain medication, which will result in effects similar to an "overdose", but with the same medicine and dose that they had always taken. This again may lead to disorientation, cognitive impairment, impaired reflexes, respiratory depression or an untoward event leading to serious bodily harm or injury, including death.  A similar problem will occur with patients as they grow older and their liver and kidney function begins to decrease as part of the aging process.  Background information: Historically, the original case for using long-term opioid therapy to treat chronic noncancer pain was based on safety assumptions that subsequent experience has called into question. In 1996, the American Pain Society and the American Academy of Pain Medicine issued a consensus statement supporting long-term opioid therapy. This statement acknowledged the dangers of opioid prescribing but concluded that the risk for addiction was low; respiratory depression induced by opioids was short-lived, occurred mainly in opioid-naive patients, and was antagonized by pain; tolerance was not a common problem; and efforts to control diversion should not constrain opioid prescribing. This has now proven to be wrong. Experience regarding the risks for opioid addiction, misuse, and overdose in community practice has failed to support these assumptions.  According to the Centers for Disease Control and Prevention, fatal overdoses involving opioid analgesics have increased sharply over the past decade. Currently, more than 96,700 people die from drug overdoses every year. Opioids are a factor in 7 out of every 10 overdose deaths. Deaths from drug overdose have surpassed motor vehicle accidents as the leading cause of death for individuals between the ages of 80 and 61.  Clinical data suggest that neuroendocrine dysfunction may be very common in both men and women, potentially causing hypogonadism, erectile  dysfunction, infertility, decreased libido, osteoporosis, and depression. Recent studies linked higher opioid dose to increased opioid-related mortality. Controlled observational studies reported that long-term opioid therapy may be associated with increased risk for cardiovascular events. Subsequent meta-analysis concluded  that the safety of long-term opioid therapy in elderly patients has not been proven.   Side Effects and adverse reactions: Common side effects: Drowsiness (sedation). Dizziness. Nausea and vomiting. Constipation. Physical dependence -- Dependence often manifests with withdrawal symptoms when opioids are discontinued or decreased. Tolerance -- As you take repeated doses of opioids, you require increased medication to experience the same effect of pain relief. Respiratory depression -- This can occur in healthy people, especially with higher doses. However, people with COPD, asthma or other lung conditions may be even more susceptible to fatal respiratory impairment.  Uncommon side effects: An increased sensitivity to feeling pain and extreme response to pain (hyperalgesia). Chronic use of opioids can lead to this. Delayed gastric emptying (the process by which the contents of your stomach are moved into your small intestine). Muscle rigidity. Immune system and hormonal dysfunction. Quick, involuntary muscle jerks (myoclonus). Arrhythmia. Itchy skin (pruritus). Dry mouth (xerostomia).  Long-term side effects: Chronic constipation. Sleep-disordered breathing (SDB). Increased risk of bone fractures. Hypothalamic-pituitary-adrenal dysregulation. Increased risk of overdose.  RISKS: Respiratory depression and death: Opioids increase the risk of respiratory depression and death.  Drug-to-drug interactions: Opioids are relatively contraindicated in combination with benzodiazepines, sleep inducers, and other central nervous system depressants. Other classes of medications  (i.e.: certain antibiotics and even over-the-counter medications) may also trigger or induce respiratory depression in some patients.  Medical conditions: Patients with pre-existing respiratory problems are at higher risk of respiratory failure and/or depression when in combination with opioid analgesics. Opioids are relatively contraindicated in some medical conditions such as central sleep apnea.   Fractures and Falls:  Opioids increase the risk and incidence of falls. This is of particular importance in elderly patients.  Endocrine System:  Long-term administration is associated with endocrine abnormalities (endocrinopathies). (Also known as Opioid-induced Endocrinopathy) Influences on both the hypothalamic-pituitary-adrenal axis?and the hypothalamic-pituitary-gonadal axis have been demonstrated with consequent hypogonadism and adrenal insufficiency in both sexes. Hypogonadism and decreased levels of dehydroepiandrosterone sulfate have been reported in men and women. Endocrine effects include: Amenorrhoea in women (abnormal absence of menstruation) Reduced libido in both sexes Decreased sexual function Erectile dysfunction in men Hypogonadisms (decreased testicular function with shrinkage of testicles) Infertility Depression and fatigue Loss of muscle mass Anxiety Depression Immune suppression Hyperalgesia Weight gain Anemia Osteoporosis Patients (particularly women of childbearing age) should avoid opioids. There is insufficient evidence to recommend routine monitoring of asymptomatic patients taking opioids in the long-term for hormonal deficiencies.  Immune System: Human studies have demonstrated that opioids have an immunomodulating effect. These effects are mediated via opioid receptors both on immune effector cells and in the central nervous system. Opioids have been demonstrated to have adverse effects on antimicrobial response and anti-tumour surveillance. Buprenorphine has  been demonstrated to have no impact on immune function.  Opioid Induced Hyperalgesia: Human studies have demonstrated that prolonged use of opioids can lead to a state of abnormal pain sensitivity, sometimes called opioid induced hyperalgesia (OIH). Opioid induced hyperalgesia is not usually seen in the absence of tolerance to opioid analgesia. Clinically, hyperalgesia may be diagnosed if the patient on long-term opioid therapy presents with increased pain. This might be qualitatively and anatomically distinct from pain related to disease progression or to breakthrough pain resulting from development of opioid tolerance. Pain associated with hyperalgesia tends to be more diffuse than the pre-existing pain and less defined in quality. Management of opioid induced hyperalgesia requires opioid dose reduction.  Cancer: Chronic opioid therapy has been associated with an increased risk of cancer  among noncancer patients with chronic pain. This association was more evident in chronic strong opioid users. Chronic opioid consumption causes significant pathological changes in the small intestine and colon. Epidemiological studies have found that there is a link between opium dependence and initiation of gastrointestinal cancers. Cancer is the second leading cause of death after cardiovascular disease. Chronic use of opioids can cause multiple conditions such as GERD, immunosuppression and renal damage as well as carcinogenic effects, which are associated with the incidence of cancers.   Mortality: Long-term opioid use has been associated with increased mortality among patients with chronic non-cancer pain (CNCP).  Prescription of long-acting opioids for chronic noncancer pain was associated with a significantly increased risk of all-cause mortality, including deaths from causes other than overdose.  Reference: Von Korff M, Kolodny A, Deyo RA, Chou R. Long-term opioid therapy reconsidered. Ann Intern Med. 2011  Sep 6;155(5):325-8. doi: 10.7326/[id number]. PMID: 64403474; PMCID: [id number]. Randon Goldsmith, Hayward RA, Dunn KM, [place] KP. Risk of adverse events in patients prescribed long-term opioids: A cohort study in the [place] [facility] Research Datalink. Eur J Pain. 2019 May;23(5):908-922. doi: 10.1002/ejp.1357. Epub 2019 Jan 31. PMID: 43329518. Colameco S, Coren JS, Ciervo CA. Continuous opioid treatment for chronic noncancer pain: a time for moderation in prescribing. Postgrad Med. 2009 Jul;121(4):61-6. doi: 10.3810/pgm.2009.07.2032. PMID: 84166063. William Hamburger RN, [place] SD, Blazina I, Cristopher Peru, Bougatsos C, Deyo RA. The effectiveness and risks of long-term opioid therapy for chronic pain: a systematic review for a [employer] of [employer] Pathways to [employer]. Ann Intern Med. 2015 Feb 17;162(4):276-86. doi: 10.7326/M14-2559. PMID: 01601093. Caryl Bis [facility], Makuc DM. NCHS Data Brief No. 22. [place]: Centers for Disease Control and Prevention; 2009. Sep, Increase in Fatal Poisonings Involving Opioid Analgesics in the [place], 1999-2006. Song IA, Choi HR, Oh TK. Long-term opioid use and mortality in patients with chronic non-cancer pain: Ten-year follow-up study in [place] from 2010 through 2019. EClinicalMedicine. 2022 Jul 18;51:101558. doi: 10.1016/j.eclinm.2022.235573. PMID: 22025427; PMCID: [id number]. Huser, W., Schubert, T., Vogelmann, T. et al. All-cause mortality in patients with long-term opioid therapy compared with non-opioid analgesics for chronic non-cancer pain: a database study. BMC Med 18, 162 (2020). http://lester.info/ Rashidian H, Karie Kirks, Malekzadeh R, Haghdoost AA. An Ecological Study of the Association between Opiate Use and Incidence of Cancers. Addict Health. 2016 Fall;8(4):252-260. PMID: 51761607; PMCID: [id number].  Our Goal: Our goal is to control your  pain with means other than the use of opioid pain medications.  Our Recommendation: Talk to your physician about coming off of these medications. We can assist you with the tapering down and stopping these medicines. Based on the new information, even if you cannot completely stop the medication, a decrease in the dose may be associated with a lesser risk. Ask for other means of controlling the pain. Decrease or eliminate those factors that significantly contribute to your pain such as smoking, obesity, and a diet heavily tilted towards "inflammatory" nutrients.  Last Updated: 07/17/2022   ____________________________________________________________________________________________     ____________________________________________________________________________________________  National Pain Medication Shortage  The [place] is experiencing worsening drug shortages. These have had a negative widespread effect on patient care and treatment. Not expected to improve any time soon. Predicted to last past 2029.   Drug shortage list (generic names) Oxycodone IR Oxycodone/APAP Oxymorphone IR Hydromorphone Hydrocodone/APAP Morphine  Where is the problem?  Manufacturing and supply level.  Will this shortage affect you?  Only if you  take any of the above pain medications.  How? You may be unable to fill your prescription.  Your pharmacist may offer a "partial fill" of your prescription. (Warning: Do not accept partial fills.) Prescriptions partially filled cannot be transferred to another pharmacy. Read our Medication Rules and Regulation. Depending on how much medicine you are dependent on, you may experience withdrawals when unable to get the medication.  Recommendations: Consider ending your dependence on opioid pain medications. Ask your pain specialist to assist you with the process. Consider switching to a medication currently not in shortage, such as Buprenorphine. Talk to your pain  specialist about this option. Consider decreasing your pain medication requirements by managing tolerance thru "Drug Holidays". This may help minimize withdrawals, should you run out of medicine. Control your pain thru the use of non-pharmacological interventional therapies.   Your prescriber: Prescribers cannot be blamed for shortages. Medication manufacturing and supply issues cannot be fixed by the prescriber.   NOTE: The prescriber is not responsible for supplying the medication, or solving supply issues. Work with your pharmacist to solve it. The patient is responsible for the decision to take or continue taking the medication and for identifying and securing a legal supply source. By law, supplying the medication is the job and responsibility of the pharmacy. The prescriber is responsible for the evaluation, monitoring, and prescribing of these medications.   Prescribers will NOT: Re-issue prescriptions that have been partially filled. Re-issue prescriptions already sent to a pharmacy.  Re-send prescriptions to a different pharmacy because yours did not have your medication. Ask pharmacist to order more medicine or transfer the prescription to another pharmacy. (Read below.)  New 2023 regulation: "September 09, 2021 Revised Regulation Allows DEA-Registered Pharmacies to Transfer Electronic Prescriptions at a Patient's Request DEA Headquarters Division - Public Information Office Patients now have the ability to request their electronic prescription be transferred to another pharmacy without having to go back to their practitioner to initiate the request. This revised regulation went into effect on Monday, September 05, 2021.     At a patient's request, a DEA-registered retail pharmacy can now transfer an electronic prescription for a controlled substance (schedules II-V) to another DEA-registered retail pharmacy. Prior to this change, patients would have to go through their practitioner to  cancel their prescription and have it re-issued to a different pharmacy. The process was taxing and time consuming for both patients and practitioners.    The Drug Enforcement Administration [facility]) published its intent to revise the process for transferring electronic prescriptions on November 28, 2019.  The final rule was published in the federal register on August 04, 2021 and went into effect 30 days later.  Under the final rule, a prescription can only be transferred once between pharmacies, and only if allowed under existing state or other applicable law. The prescription must remain in its electronic form; may not be altered in any way; and the transfer must be communicated directly between two licensed pharmacists. It's important to note, any authorized refills transfer with the original prescription, which means the entire prescription will be filled at the same pharmacy".  Reference: HugeHand.is [facility] website announcement)  [email or web address] [employer] of Justice)   [employer] / Vol. 88, No. 143 / Thursday, August 04, 2021 / Rules and Regulations DEPARTMENT OF JUSTICE  Drug Enforcement Administration  21 CFR Part 1306  [Docket No. [id number]]  RIN [id number] Transfer of Electronic Prescriptions for Schedules II-V Controlled Substances Between Pharmacies for Initial Filling  ____________________________________________________________________________________________  ____________________________________________________________________________________________  Transfer of Pain Medication between Pharmacies  Re: 2023 DEA Clarification on existing regulation  Published on DEA Website: September 09, 2021  Title: Revised Regulation Allows DEA-Registered Pharmacies to Electrical engineer Prescriptions at a Patient's  Request DEA Headquarters Division - [employer]  "Patients now have the ability to request their electronic prescription be transferred to another pharmacy without having to go back to their practitioner to initiate the request. This revised regulation went into effect on Monday, September 05, 2021.     At a patient's request, a DEA-registered retail pharmacy can now transfer an electronic prescription for a controlled substance (schedules II-V) to another DEA-registered retail pharmacy. Prior to this change, patients would have to go through their practitioner to cancel their prescription and have it re-issued to a different pharmacy. The process was taxing and time consuming for both patients and practitioners.    The Drug Enforcement Administration [facility]) published its intent to revise the process for transferring electronic prescriptions on November 28, 2019.  The final rule was published in the federal register on August 04, 2021 and went into effect 30 days later.  Under the final rule, a prescription can only be transferred once between pharmacies, and only if allowed under existing state or other applicable law. The prescription must remain in its electronic form; may not be altered in any way; and the transfer must be communicated directly between two licensed pharmacists. It's important to note, any authorized refills transfer with the original prescription, which means the entire prescription will be filled at the same pharmacy."    REFERENCES: 1. DEA website announcement [email or web address]  2. Department of Justice website  [email or web address].pdf  3. DEPARTMENT OF JUSTICE Drug Enforcement Administration 21 CFR Part 1306 [Docket No. [id number]] RIN [id number] "Transfer of Electronic Prescriptions for Schedules II-V Controlled Substances  Between Pharmacies for Initial Filling"  ____________________________________________________________________________________________     _______________________________________________________________________  Medication Rules  Purpose: To inform patients, and their family members, of our medication rules and regulations.  Applies to: All patients receiving prescriptions from our practice (written or electronic).  Pharmacy of record: This is the pharmacy where your electronic prescriptions will be sent. Make sure we have the correct one.  Electronic prescriptions: In compliance with the [facility] Strengthen Opioid Misuse Prevention (STOP) Act of 2017 (Session Conni Elliot [phone]), effective January 09, 2018, all controlled substances must be electronically prescribed. Written prescriptions, faxing, or calling prescriptions to a pharmacy will no longer be done.  Prescription refills: These will be provided only during in-person appointments. No medications will be renewed without a "face-to-face" evaluation with your provider. Applies to all prescriptions.  NOTE: The following applies primarily to controlled substances (Opioid* Pain Medications).   Type of encounter (visit): For patients receiving controlled substances, face-to-face visits are required. (Not an option and not up to the patient.)  Patient's responsibilities: Pain Pills: Bring all pain pills to every appointment (except for procedure appointments). Pill Bottles: Bring pills in original pharmacy bottle. Bring bottle, even if empty. Always bring the bottle of the most recent fill.  Medication refills: You are responsible for knowing and keeping track of what medications you are taking and when is it that you will need a refill. The day before your appointment: write a list of all prescriptions that need to be refilled. The day of the appointment: give the list to the admitting nurse. Prescriptions will be written only  during appointments. No prescriptions will be written on procedure days. If you forget a  medication: it will not be "Called in", "Faxed", or "electronically sent". You will need to get another appointment to get these prescribed. No early refills. Do not call asking to have your prescription filled early. Partial  or short prescriptions: Occasionally your pharmacy may not have enough pills to fill your prescription.  NEVER ACCEPT a partial fill or a prescription that is short of the total amount of pills that you were prescribed.  With controlled substances the law allows 72 hours for the pharmacy to complete the prescription.  If the prescription is not completed within 72 hours, the pharmacist will require a new prescription to be written. This means that you will be short on your medicine and we WILL NOT send another prescription to complete your original prescription.  Instead, request the pharmacy to send a carrier to a nearby branch to get enough medication to provide you with your full prescription. Prescription Accuracy: You are responsible for carefully inspecting your prescriptions before leaving our office. Have the discharge nurse carefully go over each prescription with you, before taking them home. Make sure that your name is accurately spelled, that your address is correct. Check the name and dose of your medication to make sure it is accurate. Check the number of pills, and the written instructions to make sure they are clear and accurate. Make sure that you are given enough medication to last until your next medication refill appointment. Taking Medication: Take medication as prescribed. When it comes to controlled substances, taking less pills or less frequently than prescribed is permitted and encouraged. Never take more pills than instructed. Never take the medication more frequently than prescribed.  Inform other Doctors: Always inform, all of your healthcare providers, of all the  medications you take. Pain Medication from other Providers: You are not allowed to accept any additional pain medication from any other Doctor or Healthcare provider. There are two exceptions to this rule. (see below) In the event that you require additional pain medication, you are responsible for notifying us, as stated below. Cough Medicine: Often these contain an opioid, such as codeine or hydrocodone. Never accept or take cough medicine containing these opioids if you are already taking an opioid* medication. The combination may cause respiratory failure and death. Medication Agreement: You are responsible for carefully reading and following our Medication Agreement. This must be signed before receiving any prescriptions from our practice. Safely store a copy of your signed Agreement. Violations to the Agreement will result in no further prescriptions. (Additional copies of our Medication Agreement are available upon request.) Laws, Rules, & Regulations: All patients are expected to follow all [address] and [employer], [employer], Rules, [place] [place]. Ignorance of the Laws does not constitute a valid excuse.  Illegal drugs and Controlled Substances: The use of illegal substances (including, but not limited to marijuana and its derivatives) and/or the illegal use of any controlled substances is strictly prohibited. Violation of this rule may result in the immediate and permanent discontinuation of any and all prescriptions being written by our practice. The use of any illegal substances is prohibited. Adopted CDC guidelines & recommendations: Target dosing levels will be at or below 60 MME/day. Use of benzodiazepines** is not recommended.  Exceptions: There are only two exceptions to the rule of not receiving pain medications from other Healthcare Providers. Exception #1 (Emergencies): In the event of an emergency (i.e.: accident requiring emergency care), you are allowed to receive additional pain  medication. However, you are responsible for: As soon as  you are able, call our office [phone], at any time of the day or night, and leave a message stating your name, the date and nature of the emergency, and the name and dose of the medication prescribed. In the event that your call is answered by a member of our staff, make sure to document and save the date, time, and the name of the person that took your information.  Exception #2 (Planned Surgery): In the event that you are scheduled by another doctor or dentist to have any type of surgery or procedure, you are allowed (for a period no longer than 30 days), to receive additional pain medication, for the acute post-op pain. However, in this case, you are responsible for picking up a copy of our "Post-op Pain Management for Surgeons" handout, and giving it to your surgeon or dentist. This document is available at our office, and does not require an appointment to obtain it. Simply go to our office during business hours (Monday-Thursday from 8:00 AM to 4:00 PM) (Friday 8:00 AM to 12:00 Noon) or if you have a scheduled appointment with [place], prior to your surgery, and ask for it by name. In addition, you are responsible for: calling our office [phone], at any time of the day or night, and leaving a message stating your name, name of your surgeon, type of surgery, and date of procedure or surgery. Failure to comply with your responsibilities may result in termination of therapy involving the controlled substances. Medication Agreement Violation. Following the above rules, including your responsibilities will help you in avoiding a Medication Agreement Violation ("Breaking your Pain Medication Contract").  Consequences:  Not following the above rules may result in permanent discontinuation of medication prescription therapy.  *Opioid medications include: morphine, codeine, oxycodone, oxymorphone, hydrocodone, hydromorphone, meperidine, tramadol,  tapentadol, buprenorphine, fentanyl, methadone. **Benzodiazepine medications include: diazepam (Valium), alprazolam (Xanax), clonazepam (Klonopine), lorazepam (Ativan), clorazepate (Tranxene), chlordiazepoxide (Librium), estazolam (Prosom), oxazepam (Serax), temazepam (Restoril), triazolam (Halcion) (Last updated: 11/01/2021) ______________________________________________________________________    ______________________________________________________________________  Medication Recommendations and Reminders  Applies to: All patients receiving prescriptions (written and/or electronic).  Medication Rules & Regulations: You are responsible for reading, knowing, and following our "Medication Rules" document. These exist for your safety and that of others. They are not flexible and neither are we. Dismissing or ignoring them is an act of "non-compliance" that may result in complete and irreversible termination of such medication therapy. For safety reasons, "non-compliance" will not be tolerated. As with the [place] fundamental legal principle of "ignorance of the law is no defense", we will accept no excuses for not having read and knowing the content of documents provided to you by our practice.  Pharmacy of record:  Definition: This is the pharmacy where your electronic prescriptions will be sent.  We do not endorse any particular pharmacy. It is up to you and your insurance to decide what pharmacy to use.  We do not restrict you in your choice of pharmacy. However, once we write for your prescriptions, we will NOT be re-sending more prescriptions to fix restricted supply problems created by your pharmacy, or your insurance.  The pharmacy listed in the electronic medical record should be the one where you want electronic prescriptions to be sent. If you choose to change pharmacy, simply notify our nursing staff. Changes will be made only during your regular appointments and not over the  phone.  Recommendations: Keep all of your pain medications in a safe place, under lock and key, even  if you live alone. We will NOT replace lost, stolen, or damaged medication. We do not accept "Police Reports" as proof of medications having been stolen. After you fill your prescription, take 1 week's worth of pills and put them away in a safe place. You should keep a separate, properly labeled bottle for this purpose. The remainder should be kept in the original bottle. Use this as your primary supply, until it runs out. Once it's gone, then you know that you have 1 week's worth of medicine, and it is time to come in for a prescription refill. If you do this correctly, it is unlikely that you will ever run out of medicine. To make sure that the above recommendation works, it is very important that you make sure your medication refill appointments are scheduled at least 1 week before you run out of medicine. To do this in an effective manner, make sure that you do not leave the office without scheduling your next medication management appointment. Always ask the nursing staff to show you in your prescription , when your medication will be running out. Then arrange for the receptionist to get you a return appointment, at least 7 days before you run out of medicine. Do not wait until you have 1 or 2 pills left, to come in. This is very poor planning and does not take into consideration that we may need to cancel appointments due to bad weather, sickness, or emergencies affecting our staff. DO NOT ACCEPT A "Partial Fill": If for any reason your pharmacy does not have enough pills/tablets to completely fill or refill your prescription, do not allow for a "partial fill". The law allows the pharmacy to complete that prescription within 72 hours, without requiring a new prescription. If they do not fill the rest of your prescription within those 72 hours, you will need a separate prescription to fill the remaining  amount, which we will NOT provide. If the reason for the partial fill is your insurance, you will need to talk to the pharmacist about payment alternatives for the remaining tablets, but again, DO NOT ACCEPT A PARTIAL FILL, unless you can trust your pharmacist to obtain the remainder of the pills within 72 hours.  Prescription refills and/or changes in medication(s):  Prescription refills, and/or changes in dose or medication, will be conducted only during scheduled medication management appointments. (Applies to both, written and electronic prescriptions.) No refills on procedure days. No medication will be changed or started on procedure days. No changes, adjustments, and/or refills will be conducted on a procedure day. Doing so will interfere with the diagnostic portion of the procedure. No phone refills. No medications will be "called into the pharmacy". No Fax refills. No weekend refills. No Holliday refills. No after hours refills.  Remember:  Business hours are:  Monday to Thursday 8:00 AM to 4:00 PM Provider's Schedule: Delano Metz, MD - Appointments are:  Medication management: Monday and Wednesday 8:00 AM to 4:00 PM Procedure day: Tuesday and Thursday 7:30 AM to 4:00 PM Edward Jolly, MD - Appointments are:  Medication management: Tuesday and Thursday 8:00 AM to 4:00 PM Procedure day: Monday and Wednesday 7:30 AM to 4:00 PM (Last update: 11/01/2021) ______________________________________________________________________   ____________________________________________________________________________________________  Naloxone Nasal Spray  Why am I receiving this medication? [place] STOP ACT requires that all patients taking high dose opioids or at risk of opioids respiratory depression, be prescribed an opioid reversal agent, such as Naloxone (AKA: Narcan).  What is this medication? NALOXONE (  nal OX one) treats opioid overdose, which causes slow or shallow breathing,  severe drowsiness, or trouble staying awake. Call emergency services after using this medication. You may need additional treatment. Naloxone works by reversing the effects of opioids. It belongs to a group of medications called opioid blockers.  COMMON BRAND NAME(S): Kloxxado, Narcan  What should I tell my care team before I take this medication? They need to know if you have any of these conditions: Heart disease Substance use disorder An unusual or allergic reaction to naloxone, other medications, foods, dyes, or preservatives Pregnant or trying to get pregnant Breast-feeding  When to use this medication? This medication is to be used for the treatment of respiratory depression (less than 8 breaths per minute) secondary to opioid overdose.   How to use this medication? This medication is for use in the nose. Lay the person on their back. Support their neck with your hand and allow the head to tilt back before giving the medication. The nasal spray should be given into 1 nostril. After giving the medication, move the person onto their side. Do not remove or test the nasal spray until ready to use. Get emergency medical help right away after giving the first dose of this medication, even if the person wakes up. You should be familiar with how to recognize the signs and symptoms of a narcotic overdose. If more doses are needed, give the additional dose in the other nostril. Talk to your care team about the use of this medication in children. While this medication may be prescribed for children as young as newborns for selected conditions, precautions do apply.  Naloxone Overdosage: If you think you have taken too much of this medicine contact a poison control center or emergency room at once.  NOTE: This medicine is only for you. Do not share this medicine with others.  What if I miss a dose? This does not apply.  What may interact with this medication? This is only used during an  emergency. No interactions are expected during emergency use. This list may not describe all possible interactions. Give your health care provider a list of all the medicines, herbs, non-prescription drugs, or dietary supplements you use. Also tell them if you smoke, drink alcohol, or use illegal drugs. Some items may interact with your medicine.  What should I watch for while using this medication? Keep this medication ready for use in the case of an opioid overdose. Make sure that you have the phone number of your care team and local hospital ready. You may need to have additional doses of this medication. Each nasal spray contains a single dose. Some emergencies may require additional doses. After use, bring the treated person to the nearest hospital or call 911. Make sure the treating care team knows that the person has received a dose of this medication. You will receive additional instructions on what to do during and after use of this medication before an emergency occurs.  What side effects may I notice from receiving this medication? Side effects that you should report to your care team as soon as possible: Allergic reactions--skin rash, itching, hives, swelling of the face, lips, tongue, or throat Side effects that usually do not require medical attention (report these to your care team if they continue or are bothersome): Constipation Dryness or irritation inside the nose Headache Increase in blood pressure Muscle spasms Stuffy nose Toothache This list may not describe all possible side effects. Call your doctor for  medical advice about side effects. You may report side effects to FDA at 1-800-FDA-1088.  Where should I keep my medication? Because this is an emergency medication, you should keep it with you at all times.  Keep out of the reach of children and pets. Store between 20 and 25 degrees C (68 and 77 degrees F). Do not freeze. Throw away any unused medication after the  expiration date. Keep in original box until ready to use.  NOTE: This sheet is a summary. It may not cover all possible information. If you have questions about this medicine, talk to your doctor, pharmacist, or health care provider.   2023 [employer]/Gold Standard (2020-09-03 00:00:00)  ____________________________________________________________________________________________

## 2022-10-01 NOTE — Progress Notes (Unsigned)
PROVIDER NOTE: Information contained herein reflects review and annotations entered in association with encounter. Interpretation of such information and data should be left to medically-trained personnel. Information provided to patient can be located elsewhere in the medical record under "Patient Instructions". Document created using STT-dictation technology, any transcriptional errors that may result from process are unintentional.    Patient: Tiffany Herring  Service Category: E/M  Provider: Oswaldo Done, MD  DOB: 1960/07/01  DOS: 10/02/2022  Referring Provider: Rolm Gala, MD  MRN: [id number]  Specialty: Interventional Pain Management  PCP: Rolm Gala, MD  Type: Established Patient  Setting: Ambulatory outpatient    Location: Office  Delivery: Face-to-face     HPI  [name], a 62 y.o. year old female, is here today because of her Chronic pain syndrome [G89.4]. [name] primary complain today is No chief complaint on file.  Pertinent problems: [name] has Failed back surgical syndrome; Epidural fibrosis; Chronic low back pain (Bilateral) w/ sciatica (Right); Chronic lumbar radicular pain (Right); Generalized OA; Multiple sclerosis (HCC); Degenerative arthritis of hip; Fibromyalgia; Lumbar spondylosis; Grade 1 Anterolisthesis of L4/L5; Chronic sacroiliac joint pain (bilateral); Chronic hip pain (Left); Arthropathy of left hip (Severe chronic left hip DJD); Osteoarthritis of hip (Left); Lumbar facet arthropathy (HCC); Lumbar facet hypertrophy; Lumbar facet syndrome (Bilateral); Chronic flank pain (Right); Chronic lower extremity pain (Right); Chronic pain syndrome; Venous stasis ulcer (HCC); OA (osteoarthritis); Osteoarthrosis, hip; Generalized osteoarthritis of multiple sites; and DDD (degenerative disc disease), lumbosacral on their pertinent problem list. Pain Assessment: Severity of   is reported as a  /10. Location:    / . Onset:  . Quality:  . Timing:  .  Modifying factor(s):  Marland Kitchen Vitals:  vitals were not taken for this visit.  BMI: Estimated body mass index is 57.61 kg/m as calculated from the following:   Height as of 05/15/22: 5\' 2"  (1.575 m).   Weight as of 05/15/22: 315 lb (142.9 kg). Last encounter: 09/06/2022. Last procedure: Visit date not found.  Reason for encounter: medication management. ***  Routine UDS ordered today.   RTCB: 01/06/2023   Pharmacotherapy Assessment  Analgesic: MSIR 15 mg, 1 tab PO TID. "Drug Holiday" completed on 02/23/2021.  Tiffany Herring from MME of 110 mg/day to 45 mg/day). MME/day: 45 mg/day   Monitoring: [place] PMP: PDMP reviewed during this encounter.       Pharmacotherapy: No side-effects or adverse reactions reported. Compliance: No problems identified. Effectiveness: Clinically acceptable.  No notes on file  No results found for: "CBDTHCR" No results found for: "D8THCCBX" No results found for: "D9THCCBX"  UDS:  Summary  Date Value Ref Range Status  06/15/2021 Note  Final    Comment:    ==================================================================== [employer] [facility] 13 (MW) ==================================================================== Test                             Result       Flag       Units  Drug Present and Declared for Prescription Verification   Morphine                       11584        EXPECTED   ng/mg creat   Normorphine                    189          EXPECTED   ng/mg creat  Potential sources of large amounts of morphine in the absence of    codeine include administration of morphine or use of heroin.     Normorphine is an expected metabolite of morphine.    Hydromorphone                  90           EXPECTED   ng/mg creat    Hydromorphone may be present as a metabolite of morphine;    concentrations of hydromorphone rarely exceed 5% of the morphine    concentration when this is the source of hydromorphone.  Drug Absent but Declared for Prescription Verification    Oxycodone                      Not Detected UNEXPECTED ng/mg creat ==================================================================== Test                      Result    Flag   Units      Ref Range   Creatinine              79               mg/dL      >=84 ==================================================================== Declared Medications:  The flagging and interpretation on this report are based on the  following declared medications.  Unexpected results may arise from  inaccuracies in the declared medications.   **Note: The testing scope of this panel includes these medications:   Morphine (MSIR)  Oxycodone   **Note: The testing scope of this panel does not include the  following reported medications:   Albuterol (Ventolin HFA)  Aspirin  Bupropion (Wellbutrin)  Clotrimazole (Mycelex)  Cyanocobalamin  Enalapril (Vasotec)  Furosemide (Lasix)  Gabapentin (Neurontin)  Insulin (Novolin)  Mirtazapine (Remeron)  Montelukast (Singulair)  Naloxone (Narcan)  Oxybutynin (Ditropan)  Timolol (Timoptic)  Tizanidine (Zanaflex) ==================================================================== For clinical consultation, please call [phone]. ====================================================================       ROS  Constitutional: Denies any fever or chills Gastrointestinal: No reported hemesis, hematochezia, vomiting, or acute GI distress Musculoskeletal: Denies any acute onset joint swelling, redness, loss of ROM, or weakness Neurological: No reported episodes of acute onset apraxia, aphasia, dysarthria, agnosia, amnesia, paralysis, loss of coordination, or loss of consciousness  Medication Review  BLOOD GLUCOSE TEST STRIPS, Cholecalciferol, Fifty50 Glucose Meter 2.0, Insulin Syringe-Needle U-100, accu-chek multiclix, albuterol, aspirin, buPROPion, clotrimazole, cyanocobalamin, dapagliflozin propanediol, enalapril, furosemide, gabapentin, glucose blood, insulin  glargine, latanoprost, mirtazapine, montelukast, morphine, oxybutynin, tiZANidine, and timolol  History Review  Allergy: [name] is allergic to amoxicillin, amoxicillin-pot clavulanate, copaxone  [glatiramer acetate], glatiramer, pseudoephedrine, rofecoxib, sulfa antibiotics, and meloxicam. Drug: Ms. Finberg  reports no history of drug use. Alcohol:  reports no history of alcohol use. Tobacco:  reports that she has quit smoking. She has never used smokeless tobacco. Social: Ms. Socha  reports that she has quit smoking. She has never used smokeless tobacco. She reports that she does not drink alcohol and does not use drugs. Medical:  has a past medical history of Allergy, Arthritis, Asthma, Chronic kidney disease, Depression, Diabetes mellitus without complication (HCC), Hypertension, Multiple sclerosis (HCC), Neuromuscular disorder (HCC), and Sleep apnea. Surgical: Ms. Mccloy  has a past surgical history that includes Cesarean section (1991 and 1996). Family: family history includes Dementia in her mother; Depression in her mother; Diabetes in her mother; Hyperlipidemia in her mother; Stroke in her father.  Laboratory Chemistry Profile   Renal  Lab Results  Component Value Date   BUN 18 02/06/2018   CREATININE 1.22 (H) 02/06/2018   BCR 15 02/06/2018   GFRAA 56 (L) 02/06/2018   GFRNONAA 49 (L) 02/06/2018    Hepatic Lab Results  Component Value Date   AST 25 02/06/2018   ALT 19 02/15/2015   ALBUMIN 4.6 02/06/2018   ALKPHOS 97 02/06/2018    Electrolytes Lab Results  Component Value Date   NA 136 02/06/2018   K 4.9 02/06/2018   CL 95 (L) 02/06/2018   CALCIUM 9.8 02/06/2018   MG 1.8 02/06/2018    Bone Lab Results  Component Value Date   25OHVITD1 55 02/06/2018   25OHVITD2 2.9 02/06/2018   25OHVITD3 52 02/06/2018    Inflammation (CRP: Acute Phase) (ESR: Chronic Phase) Lab Results  Component Value Date   CRP 17 (H) 02/06/2018   ESRSEDRATE 28 02/06/2018          Note: Above Lab results reviewed.  Recent Imaging Review  US RENAL CLINICAL DATA:  Chronic renal disease  EXAM: RENAL / URINARY TRACT ULTRASOUND COMPLETE  COMPARISON:  None Available.  FINDINGS: Right Kidney:  Renal measurements: 10.3 x 4.9 x 5.4 cm = volume: 143 mL. Diffuse increased echogenicity.  Left Kidney:  Renal measurements: 9.9 x 4.6 x 5.2 cm = volume: 125 mL. Diffuse increased echogenicity.  Bladder:  Appears normal for degree of bladder distention.  Other:  None.  IMPRESSION: Diffuse increased echogenicity in both kidneys is consistent with medical renal disease. No other abnormalities.  Electronically Signed   By: Gerome Sam III M.D.   On: 11/25/2021 14:49 Note: Reviewed        Physical Exam  General appearance: Well nourished, well developed, and well hydrated. In no apparent acute distress Mental status: Alert, oriented x 3 (person, place, & time)       Respiratory: No evidence of acute respiratory distress Eyes: PERLA Vitals: There were no vitals taken for this visit. BMI: Estimated body mass index is 57.61 kg/m as calculated from the following:   Height as of 05/15/22: 5\' 2"  (1.575 m).   Weight as of 05/15/22: 315 lb (142.9 kg). Ideal: Patient weight not recorded  Assessment   Diagnosis Status  1. Chronic pain syndrome   2. Chronic low back pain (Bilateral) w/ sciatica (Right)   3. Chronic lower extremity pain (Right)   4. Failed back surgical syndrome   5. DDD (degenerative disc disease), lumbosacral   6. Generalized osteoarthritis of multiple sites   7. Multiple sclerosis (HCC)   8. Chronic sacroiliac joint pain (bilateral)   9. Lumbar facet syndrome (Bilateral)   10. Chronic hip pain (Left)   11. Pharmacologic therapy   12. Chronic use of opiate for therapeutic purpose   13. Encounter for medication management   14. Encounter for chronic pain management    Controlled Controlled Controlled   Updated Problems: No problems  updated.  Plan of Care  Problem-specific:  No problem-specific Assessment & Plan notes found for this encounter.  Ms. Kristan Laurens has a current medication list which includes the following long-term medication(s): albuterol, bupropion, furosemide, gabapentin, insulin glargine, mirtazapine, montelukast, and morphine.  Pharmacotherapy (Medications Ordered): No orders of the defined types were placed in this encounter.  Orders:  No orders of the defined types were placed in this encounter.  Follow-up plan:   No follow-ups on file.      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 Total daily dose brought down from 110 MME/day to 60. Oxycodone (ineffective).       Recent Visits Date Type Provider Dept  09/06/22 Office Visit Delano Metz, MD [facility]  Showing recent visits within past 90 days and meeting all other requirements Future Appointments Date Type Provider Dept  10/02/22 Appointment Delano Metz, MD [facility]  Showing future appointments within next 90 days and meeting all other requirements  I discussed the assessment and treatment plan with the patient. The patient was provided an opportunity to ask questions and all were answered. The patient agreed with the plan and demonstrated an understanding of the instructions.  Patient advised to call back or seek an in-person evaluation if the symptoms or condition worsens.  Duration of encounter: *** minutes.  Total time on encounter, as per AMA guidelines included both the face-to-face and non-face-to-face time personally spent by the physician and/or other qualified health care professional(s) on the day of the encounter  (includes time in activities that require the physician or other qualified health care professional and does not include time in activities normally performed by clinical staff). Physician's time may include the following activities when performed: Preparing to see the patient (e.g., pre-charting review of records, searching for previously ordered imaging, lab work, and nerve conduction tests) Review of prior analgesic pharmacotherapies. Reviewing PMP Interpreting ordered tests (e.g., lab work, imaging, nerve conduction tests) Performing post-procedure evaluations, including interpretation of diagnostic procedures Obtaining and/or reviewing separately obtained history Performing a medically appropriate examination and/or evaluation Counseling and educating the patient/family/caregiver Ordering medications, tests, or procedures Referring and communicating with other health care professionals (when not separately reported) Documenting clinical information in the electronic or other health record Independently interpreting results (not separately reported) and communicating results to the patient/ family/caregiver Care coordination (not separately reported)  Note by: Oswaldo Done, MD Date: 10/02/2022; Time: 1:36 PM

## 2022-10-02 ENCOUNTER — Ambulatory Visit: Payer: Medicare HMO | Attending: Pain Medicine | Admitting: Pain Medicine

## 2022-10-02 ENCOUNTER — Encounter: Payer: Self-pay | Admitting: Pain Medicine

## 2022-10-02 VITALS — BP 145/83 | HR 81 | Temp 97.3°F | Resp 18 | Ht 62.0 in | Wt 258.0 lb

## 2022-10-02 DIAGNOSIS — M5137 Other intervertebral disc degeneration, lumbosacral region: Secondary | ICD-10-CM | POA: Insufficient documentation

## 2022-10-02 DIAGNOSIS — M961 Postlaminectomy syndrome, not elsewhere classified: Secondary | ICD-10-CM | POA: Diagnosis not present

## 2022-10-02 DIAGNOSIS — Z79899 Other long term (current) drug therapy: Secondary | ICD-10-CM | POA: Diagnosis present

## 2022-10-02 DIAGNOSIS — M47816 Spondylosis without myelopathy or radiculopathy, lumbar region: Secondary | ICD-10-CM

## 2022-10-02 DIAGNOSIS — M79604 Pain in right leg: Secondary | ICD-10-CM | POA: Diagnosis not present

## 2022-10-02 DIAGNOSIS — M533 Sacrococcygeal disorders, not elsewhere classified: Secondary | ICD-10-CM | POA: Diagnosis present

## 2022-10-02 DIAGNOSIS — Z79891 Long term (current) use of opiate analgesic: Secondary | ICD-10-CM | POA: Diagnosis present

## 2022-10-02 DIAGNOSIS — M5441 Lumbago with sciatica, right side: Secondary | ICD-10-CM | POA: Diagnosis present

## 2022-10-02 DIAGNOSIS — M159 Polyosteoarthritis, unspecified: Secondary | ICD-10-CM | POA: Diagnosis present

## 2022-10-02 DIAGNOSIS — G35D Multiple sclerosis, unspecified: Secondary | ICD-10-CM

## 2022-10-02 DIAGNOSIS — M51379 Other intervertebral disc degeneration, lumbosacral region without mention of lumbar back pain or lower extremity pain: Secondary | ICD-10-CM

## 2022-10-02 DIAGNOSIS — G8929 Other chronic pain: Secondary | ICD-10-CM

## 2022-10-02 DIAGNOSIS — G35 Multiple sclerosis: Secondary | ICD-10-CM | POA: Insufficient documentation

## 2022-10-02 DIAGNOSIS — G894 Chronic pain syndrome: Secondary | ICD-10-CM | POA: Diagnosis present

## 2022-10-02 DIAGNOSIS — M25552 Pain in left hip: Secondary | ICD-10-CM | POA: Diagnosis present

## 2022-10-02 MED ORDER — MORPHINE SULFATE 15 MG PO TABS
15.0000 mg | ORAL_TABLET | Freq: Three times a day (TID) | ORAL | 0 refills | Status: DC
Start: 2022-11-07 — End: 2023-01-01

## 2022-10-02 MED ORDER — MORPHINE SULFATE 15 MG PO TABS: 15.0000 mg | ORAL_TABLET | Freq: Three times a day (TID) | ORAL | 0 refills | Status: DC

## 2022-10-02 MED ORDER — NALOXONE HCL 4 MG/0.1ML NA LIQD
1.0000 | NASAL | 0 refills | Status: AC | PRN
Start: 2022-10-02 — End: 2023-10-02

## 2022-10-02 MED ORDER — MORPHINE SULFATE 15 MG PO TABS
15.0000 mg | ORAL_TABLET | Freq: Three times a day (TID) | ORAL | 0 refills | Status: DC
Start: 2022-10-08 — End: 2023-01-01

## 2022-10-02 NOTE — Progress Notes (Signed)
Nursing Pain Medication Assessment:  Safety precautions to be maintained throughout the outpatient stay will include: orient to surroundings, keep bed in low position, maintain call bell within reach at all times, provide assistance with transfer out of bed and ambulation.  Medication Inspection Compliance: Pill count conducted under aseptic conditions, in front of the patient. Neither the pills nor the bottle was removed from the patient's sight at any time. Once count was completed pills were immediately returned to the patient in their original bottle.  Medication: Morphine IR Pill/Patch Count:  23 of 90 pills remain Pill/Patch Appearance: Markings consistent with prescribed medication Bottle Appearance: Standard pharmacy container. Clearly labeled. Filled Date: 8 / 76 / 2024 Last Medication intake:  Today

## 2022-10-04 ENCOUNTER — Encounter: Payer: Medicare HMO | Admitting: Pain Medicine

## 2022-10-06 LAB — COMPLIANCE DRUG ANALYSIS, UR

## 2023-01-01 ENCOUNTER — Ambulatory Visit: Payer: Medicare HMO | Attending: Pain Medicine | Admitting: Pain Medicine

## 2023-01-01 VITALS — BP 118/69 | HR 73 | Temp 97.1°F | Resp 18 | Ht 61.0 in | Wt 239.0 lb

## 2023-01-01 DIAGNOSIS — G35 Multiple sclerosis: Secondary | ICD-10-CM | POA: Insufficient documentation

## 2023-01-01 DIAGNOSIS — Z79899 Other long term (current) drug therapy: Secondary | ICD-10-CM | POA: Diagnosis present

## 2023-01-01 DIAGNOSIS — G894 Chronic pain syndrome: Secondary | ICD-10-CM | POA: Diagnosis present

## 2023-01-01 DIAGNOSIS — M533 Sacrococcygeal disorders, not elsewhere classified: Secondary | ICD-10-CM | POA: Diagnosis present

## 2023-01-01 DIAGNOSIS — M961 Postlaminectomy syndrome, not elsewhere classified: Secondary | ICD-10-CM | POA: Insufficient documentation

## 2023-01-01 DIAGNOSIS — M25552 Pain in left hip: Secondary | ICD-10-CM | POA: Insufficient documentation

## 2023-01-01 DIAGNOSIS — M5441 Lumbago with sciatica, right side: Secondary | ICD-10-CM | POA: Diagnosis present

## 2023-01-01 DIAGNOSIS — G8929 Other chronic pain: Secondary | ICD-10-CM | POA: Diagnosis present

## 2023-01-01 DIAGNOSIS — M159 Polyosteoarthritis, unspecified: Secondary | ICD-10-CM | POA: Insufficient documentation

## 2023-01-01 DIAGNOSIS — Z79891 Long term (current) use of opiate analgesic: Secondary | ICD-10-CM | POA: Insufficient documentation

## 2023-01-01 DIAGNOSIS — M79604 Pain in right leg: Secondary | ICD-10-CM | POA: Insufficient documentation

## 2023-01-01 DIAGNOSIS — M47816 Spondylosis without myelopathy or radiculopathy, lumbar region: Secondary | ICD-10-CM | POA: Diagnosis present

## 2023-01-01 MED ORDER — MORPHINE SULFATE 15 MG PO TABS: 15.0000 mg | ORAL_TABLET | Freq: Three times a day (TID) | ORAL | 0 refills | Status: DC

## 2023-01-01 NOTE — Progress Notes (Signed)
PROVIDER NOTE: Information contained herein reflects review and annotations entered in association with encounter. Interpretation of such information and data should be left to medically-trained personnel. Information provided to patient can be located elsewhere in the medical record under "Patient Instructions". Document created using STT-dictation technology, any transcriptional errors that may result from process are unintentional.    Patient: Tiffany Herring  Service Category: E/M  Provider: Oswaldo Done, MD  DOB: Dec 07, 1960  DOS: 01/01/2023  Referring Provider: Rolm Gala, MD  MRN: [id number]  Specialty: Interventional Pain Management  PCP: Rolm Gala, MD  Type: Established Patient  Setting: Ambulatory outpatient    Location: Office  Delivery: Face-to-face     HPI  [name], a 62 y.o. year old female, is here today because of her Chronic pain syndrome [G89.4]. [name] primary complain today is Back Pain (lower)  Pertinent problems: [name] has Failed back surgical syndrome; Epidural fibrosis; Chronic low back pain (Bilateral) w/ sciatica (Right); Chronic lumbar radicular pain (Right); Generalized OA; Multiple sclerosis (HCC); Degenerative arthritis of hip; Fibromyalgia; Lumbar spondylosis; Grade 1 Anterolisthesis of L4/L5; Chronic sacroiliac joint pain (bilateral); Chronic hip pain (Left); Arthropathy of left hip (Severe chronic left hip DJD); Osteoarthritis of hip (Left); Lumbar facet arthropathy (HCC); Lumbar facet hypertrophy; Lumbar facet syndrome (Bilateral); Chronic flank pain (Right); Chronic lower extremity pain (Right); Chronic pain syndrome; Venous stasis ulcer (HCC); OA (osteoarthritis); Osteoarthrosis, hip; Generalized osteoarthritis of multiple sites; and DDD (degenerative disc disease), lumbosacral on their pertinent problem list. Pain Assessment: Severity of Chronic pain is reported as a 5 /10. Location: Back Lower/denies. Onset: More than a month  ago. Quality: Aching, Stabbing. Timing: Constant. Modifying factor(s): medications, exericse. Vitals:  height is 5\' 1"  (1.549 m) and weight is 239 lb (108.4 kg). Her temporal temperature is 97.1 F (36.2 C) (abnormal). Her blood pressure is 118/69 and her pulse is 73. Her respiration is 18 and oxygen saturation is 99%.  BMI: Estimated body mass index is 45.16 kg/m as calculated from the following:   Height as of this encounter: 5\' 1"  (1.549 m).   Weight as of this encounter: 239 lb (108.4 kg). Last encounter: 10/02/2022. Last procedure: Visit date not found.  Reason for encounter: medication management.  The patient indicates doing well with the current medication regimen. No adverse reactions or side effects reported to the medications.   Discussed the use of AI scribe software for clinical note transcription with the patient, who gave verbal consent to proceed.  History of Present Illness   The patient, on a stable medication regimen, reports no adverse reactions or side effects. She confirms that her pharmacy is still the same. She recently had a urine drug screen test, which was within normal limits. The patient does not present any new complaints or concerns and is primarily seeking medication refills. The patient's overall condition appears to be stable with no new symptoms or issues reported since the last visit.     RTCB: 04/06/2023   Pharmacotherapy Assessment  Analgesic: MSIR 15 mg, 1 tab PO TID. "Drug Holiday" completed on 02/23/2021.  Micah Flesher from MME of 110 mg/day to 45 mg/day). MME/day: 45 mg/day   Monitoring:  PMP: PDMP reviewed during this encounter.       Pharmacotherapy: No side-effects or adverse reactions reported. Compliance: No problems identified. Effectiveness: Clinically acceptable.  Concepcion Elk, RN  01/01/2023  1:16 PM  Sign when Signing Visit Nursing Pain Medication Assessment:  Safety precautions to be maintained throughout the  outpatient stay will  include: orient to surroundings, keep bed in low position, maintain call bell within reach at all times, provide assistance with transfer out of bed and ambulation.  Medication Inspection Compliance: Pill count conducted under aseptic conditions, in front of the patient. Neither the pills nor the bottle was removed from the patient's sight at any time. Once count was completed pills were immediately returned to the patient in their original bottle.  Medication: Morphine IR Pill/Patch Count:  14 of 90 pills remain Pill/Patch Appearance: Markings consistent with prescribed medication Bottle Appearance: Standard pharmacy container. Clearly labeled. Filled Date: 98 / 27 / 2024 Last Medication intake:  Today    No results found for: "CBDTHCR" No results found for: "D8THCCBX" No results found for: "D9THCCBX"  UDS:  Summary  Date Value Ref Range Status  10/03/2022 Note  Final    Comment:    ==================================================================== Compliance Drug Analysis, Ur ==================================================================== Test                             Result       Flag       Units  Drug Present and Declared for Prescription Verification   Morphine                       >14286       EXPECTED   ng/mg creat   Normorphine                    519          EXPECTED   ng/mg creat    Potential sources of large amounts of morphine in the absence of    codeine include administration of morphine or use of heroin.     Normorphine is an expected metabolite of morphine.    Hydromorphone                  167          EXPECTED   ng/mg creat    Hydromorphone may be present as a metabolite of morphine;    concentrations of hydromorphone rarely exceed 5% of the morphine    concentration when this is the source of hydromorphone.    Gabapentin                     PRESENT      EXPECTED   Bupropion                      PRESENT      EXPECTED   Hydroxybupropion                PRESENT      EXPECTED    Hydroxybupropion is an expected metabolite of bupropion.    Mirtazapine                    PRESENT      EXPECTED  Drug Present not Declared for Prescription Verification   Ephedrine/Pseudoephedrine      PRESENT      UNEXPECTED   Phenylpropanolamine            PRESENT      UNEXPECTED    Source of ephedrine/pseudoephedrine is most commonly pseudoephedrine    in over-the-counter or prescription cold and allergy medications.    Phenylpropanolamine is an expected metabolite of    ephedrine/pseudoephedrine.  Drug  Absent but Declared for Prescription Verification   Tizanidine                     Not Detected UNEXPECTED    Tizanidine, as indicated in the declared medication list, is not    always detected even when used as directed.    Salicylate                     Not Detected UNEXPECTED    Aspirin, as indicated in the declared medication list, is not always    detected even when used as directed.  ==================================================================== Test                      Result    Flag   Units      Ref Range   Creatinine              70               mg/dL      >=51 ==================================================================== Declared Medications:  The flagging and interpretation on this report are based on the  following declared medications.  Unexpected results may arise from  inaccuracies in the declared medications.   **Note: The testing scope of this panel includes these medications:   Bupropion (Wellbutrin)  Gabapentin (Neurontin)  Mirtazapine (Remeron)  Morphine (MSIR)   **Note: The testing scope of this panel does not include small to  moderate amounts of these reported medications:   Aspirin  Tizanidine (Zanaflex)   **Note: The testing scope of this panel does not include the  following reported medications:   Albuterol (Ventolin HFA)  Cholecalciferol  Clotrimazole (Mycelex)  Dapagliflozin (Farxiga)  Enalapril  (Vasotec)  Furosemide (Lasix)  Insulin (Lantus)  Latanoprost (Xalatan)  Montelukast (Singulair)  Naloxone (Narcan)  Oxybutynin (Ditropan)  Timolol (Timoptic)  Vitamin B12 ==================================================================== For clinical consultation, please call [phone]. ====================================================================       ROS  Constitutional: Denies any fever or chills Gastrointestinal: No reported hemesis, hematochezia, vomiting, or acute GI distress Musculoskeletal: Denies any acute onset joint swelling, redness, loss of ROM, or weakness Neurological: No reported episodes of acute onset apraxia, aphasia, dysarthria, agnosia, amnesia, paralysis, loss of coordination, or loss of consciousness  Medication Review  BLOOD GLUCOSE TEST STRIPS, Cholecalciferol, Fifty50 Glucose Meter 2.0, Insulin Syringe-Needle U-100, accu-chek multiclix, albuterol, aspirin, buPROPion, clotrimazole, cyanocobalamin, dapagliflozin propanediol, enalapril, furosemide, gabapentin, glucose blood, insulin glargine, latanoprost, mirtazapine, montelukast, morphine, naloxone, oxybutynin, tiZANidine, and timolol  History Review  Allergy: [name] is allergic to amoxicillin, amoxicillin-pot clavulanate, copaxone  [glatiramer acetate], glatiramer, pseudoephedrine, rofecoxib, sulfa antibiotics, and meloxicam. Drug: [name]  reports no history of drug use. Alcohol:  reports no history of alcohol use. Tobacco:  reports that she has quit smoking. She has never used smokeless tobacco. Social: [name]  reports that she has quit smoking. She has never used smokeless tobacco. She reports that she does not drink alcohol and does not use drugs. Medical:  has a past medical history of Allergy, Arthritis, Asthma, Chronic kidney disease, Depression, Diabetes mellitus without complication (HCC), Hypertension, Multiple sclerosis (HCC), Neuromuscular disorder (HCC), and Sleep  apnea. Surgical: [name]  has a past surgical history that includes Cesarean section (1991 and 1996). Family: family history includes Dementia in her mother; Depression in her mother; Diabetes in her mother; Hyperlipidemia in her mother; Stroke in her father.  Laboratory Chemistry Profile   Renal Lab Results  Component Value Date  BUN 18 02/06/2018   CREATININE 1.22 (H) 02/06/2018   BCR 15 02/06/2018   GFRAA 56 (L) 02/06/2018   GFRNONAA 49 (L) 02/06/2018    Hepatic Lab Results  Component Value Date   AST 25 02/06/2018   ALT 19 02/15/2015   ALBUMIN 4.6 02/06/2018   ALKPHOS 97 02/06/2018    Electrolytes Lab Results  Component Value Date   NA 136 02/06/2018   K 4.9 02/06/2018   CL 95 (L) 02/06/2018   CALCIUM 9.8 02/06/2018   MG 1.8 02/06/2018    Bone Lab Results  Component Value Date   25OHVITD1 55 02/06/2018   25OHVITD2 2.9 02/06/2018   25OHVITD3 52 02/06/2018    Inflammation (CRP: Acute Phase) (ESR: Chronic Phase) Lab Results  Component Value Date   CRP 17 (H) 02/06/2018   ESRSEDRATE 28 02/06/2018         Note: Above Lab results reviewed.  Recent Imaging Review  US RENAL CLINICAL DATA:  Chronic renal disease  EXAM: RENAL / URINARY TRACT ULTRASOUND COMPLETE  COMPARISON:  None Available.  FINDINGS: Right Kidney:  Renal measurements: 10.3 x 4.9 x 5.4 cm = volume: 143 mL. Diffuse increased echogenicity.  Left Kidney:  Renal measurements: 9.9 x 4.6 x 5.2 cm = volume: 125 mL. Diffuse increased echogenicity.  Bladder:  Appears normal for degree of bladder distention.  Other:  None.  IMPRESSION: Diffuse increased echogenicity in both kidneys is consistent with medical renal disease. No other abnormalities.  Electronically Signed   By: Gerome Sam III M.D.   On: 11/25/2021 14:49 Note: Reviewed        Physical Exam  General appearance: Well nourished, well developed, and well hydrated. In no apparent acute distress Mental status:  Alert, oriented x 3 (person, place, & time)       Respiratory: No evidence of acute respiratory distress Eyes: PERLA Vitals: BP 118/69 (BP Location: Left Arm, Patient Position: Sitting, Cuff Size: Normal)   Pulse 73   Temp (!) 97.1 F (36.2 C) (Temporal)   Resp 18   Ht 5\' 1"  (1.549 m)   Wt 239 lb (108.4 kg)   SpO2 99%   BMI 45.16 kg/m  BMI: Estimated body mass index is 45.16 kg/m as calculated from the following:   Height as of this encounter: 5\' 1"  (1.549 m).   Weight as of this encounter: 239 lb (108.4 kg). Ideal: Ideal body weight: 47.8 kg (105 lb 6.1 oz) Adjusted ideal body weight: 72 kg (158 lb 13.3 oz)  Assessment   Diagnosis Status  1. Chronic pain syndrome   2. Chronic low back pain (Bilateral) w/ sciatica (Right)   3. Chronic lower extremity pain (Right)   4. Failed back surgical syndrome   5. Generalized osteoarthritis of multiple sites   6. Multiple sclerosis (HCC)   7. Chronic sacroiliac joint pain (bilateral)   8. Lumbar facet syndrome (Bilateral)   9. Chronic hip pain (Left)   10. Pharmacologic therapy   11. Chronic use of opiate for therapeutic purpose   12. Encounter for medication management   13. Encounter for chronic pain management    Controlled Controlled Controlled   Updated Problems: No problems updated.  Plan of Care  Problem-specific:  Assessment and Plan    Chronic Pain Management   Routine follow-up for chronic pain management showed no side effects or adverse reactions to her current medication regimen. Her recent urine drug screen was satisfactory. We will send medication refills to the pharmacy for the next  three months and schedule a follow-up appointment in three months.       [name] has a current medication list which includes the following long-term medication(s): albuterol, bupropion, furosemide, gabapentin, insulin glargine, mirtazapine, montelukast, [START ON 01/06/2023] morphine, [START ON 02/05/2023] morphine, and  [START ON 03/07/2023] morphine.  Pharmacotherapy (Medications Ordered): Meds ordered this encounter  Medications   morphine (MSIR) 15 MG tablet    Sig: Take 1 tablet (15 mg total) by mouth every 8 (eight) hours. Must last 30 days.    Dispense:  90 tablet    Refill:  0    DO NOT: delete (not duplicate); no partial-fill (will deny script to complete), no refill request (F/U required). DISPENSE: 1 day early if closed on fill date. WARN: No CNS-depressants within 8 hrs of med.   morphine (MSIR) 15 MG tablet    Sig: Take 1 tablet (15 mg total) by mouth every 8 (eight) hours. Must last 30 days.    Dispense:  90 tablet    Refill:  0    DO NOT: delete (not duplicate); no partial-fill (will deny script to complete), no refill request (F/U required). DISPENSE: 1 day early if closed on fill date. WARN: No CNS-depressants within 8 hrs of med.   morphine (MSIR) 15 MG tablet    Sig: Take 1 tablet (15 mg total) by mouth every 8 (eight) hours. Must last 30 days.    Dispense:  90 tablet    Refill:  0    DO NOT: delete (not duplicate); no partial-fill (will deny script to complete), no refill request (F/U required). DISPENSE: 1 day early if closed on fill date. WARN: No CNS-depressants within 8 hrs of med.   Orders:  No orders of the defined types were placed in this encounter.  Follow-up plan:   Return in about 3 months (around 04/06/2023) for Eval-day (M,W), (F2F), (MM).      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 Total daily dose brought down from 110 MME/day to 60. Oxycodone (ineffective).      Recent Visits No visits were found meeting these conditions. Showing recent visits within past 90 days and meeting all other  requirements Today's Visits Date Type Provider Dept  01/01/23 Office Visit Delano Metz, MD [facility]  Showing today's visits and meeting all other requirements Future Appointments No visits were found meeting these conditions. Showing future appointments within next 90 days and meeting all other requirements  I discussed the assessment and treatment plan with the patient. The patient was provided an opportunity to ask questions and all were answered. The patient agreed with the plan and demonstrated an understanding of the instructions.  Patient advised to call back or seek an in-person evaluation if the symptoms or condition worsens.  Duration of encounter: 30 minutes.  Total time on encounter, as per AMA guidelines included both the face-to-face and non-face-to-face time personally spent by the physician and/or other qualified health care professional(s) on the day of the encounter (includes time in activities that require the physician or other qualified health care professional and does not include time in activities normally performed by clinical staff). Physician's time may include  the following activities when performed: Preparing to see the patient (e.g., pre-charting review of records, searching for previously ordered imaging, lab work, and nerve conduction tests) Review of prior analgesic pharmacotherapies. Reviewing PMP Interpreting ordered tests (e.g., lab work, imaging, nerve conduction tests) Performing post-procedure evaluations, including interpretation of diagnostic procedures Obtaining and/or reviewing separately obtained history Performing a medically appropriate examination and/or evaluation Counseling and educating the patient/family/caregiver Ordering medications, tests, or procedures Referring and communicating with other health care professionals (when not separately reported) Documenting clinical information in the electronic or other health  record Independently interpreting results (not separately reported) and communicating results to the patient/ family/caregiver Care coordination (not separately reported)  Note by: Oswaldo Done, MD Date: 01/01/2023; Time: 1:28 PM

## 2023-01-01 NOTE — Progress Notes (Signed)
Nursing Pain Medication Assessment:  Safety precautions to be maintained throughout the outpatient stay will include: orient to surroundings, keep bed in low position, maintain call bell within reach at all times, provide assistance with transfer out of bed and ambulation.  Medication Inspection Compliance: Pill count conducted under aseptic conditions, in front of the patient. Neither the pills nor the bottle was removed from the patient's sight at any time. Once count was completed pills were immediately returned to the patient in their original bottle.  Medication: Morphine IR Pill/Patch Count:  14 of 90 pills remain Pill/Patch Appearance: Markings consistent with prescribed medication Bottle Appearance: Standard pharmacy container. Clearly labeled. Filled Date: 81 / 27 / 2024 Last Medication intake:  Today

## 2023-01-01 NOTE — Patient Instructions (Signed)

## 2023-04-04 ENCOUNTER — Ambulatory Visit: Payer: Medicare HMO | Attending: Pain Medicine | Admitting: Pain Medicine

## 2023-04-04 ENCOUNTER — Encounter: Payer: Self-pay | Admitting: Pain Medicine

## 2023-04-04 VITALS — BP 116/61 | HR 89 | Temp 97.8°F | Resp 16 | Ht 61.0 in | Wt 224.0 lb

## 2023-04-04 DIAGNOSIS — M159 Polyosteoarthritis, unspecified: Secondary | ICD-10-CM | POA: Insufficient documentation

## 2023-04-04 DIAGNOSIS — M5441 Lumbago with sciatica, right side: Secondary | ICD-10-CM | POA: Diagnosis present

## 2023-04-04 DIAGNOSIS — G8929 Other chronic pain: Secondary | ICD-10-CM | POA: Diagnosis present

## 2023-04-04 DIAGNOSIS — G894 Chronic pain syndrome: Secondary | ICD-10-CM | POA: Insufficient documentation

## 2023-04-04 DIAGNOSIS — M79604 Pain in right leg: Secondary | ICD-10-CM | POA: Insufficient documentation

## 2023-04-04 DIAGNOSIS — Z79891 Long term (current) use of opiate analgesic: Secondary | ICD-10-CM | POA: Insufficient documentation

## 2023-04-04 DIAGNOSIS — M533 Sacrococcygeal disorders, not elsewhere classified: Secondary | ICD-10-CM | POA: Diagnosis present

## 2023-04-04 DIAGNOSIS — M961 Postlaminectomy syndrome, not elsewhere classified: Secondary | ICD-10-CM | POA: Diagnosis present

## 2023-04-04 DIAGNOSIS — M47816 Spondylosis without myelopathy or radiculopathy, lumbar region: Secondary | ICD-10-CM | POA: Diagnosis present

## 2023-04-04 DIAGNOSIS — Z79899 Other long term (current) drug therapy: Secondary | ICD-10-CM | POA: Insufficient documentation

## 2023-04-04 DIAGNOSIS — M25552 Pain in left hip: Secondary | ICD-10-CM | POA: Diagnosis present

## 2023-04-04 DIAGNOSIS — G35 Multiple sclerosis: Secondary | ICD-10-CM | POA: Diagnosis present

## 2023-04-04 MED ORDER — MORPHINE SULFATE 15 MG PO TABS: 15.0000 mg | ORAL_TABLET | Freq: Three times a day (TID) | ORAL | 0 refills | Status: DC

## 2023-04-04 MED ORDER — MORPHINE SULFATE 15 MG PO TABS
15.0000 mg | ORAL_TABLET | Freq: Three times a day (TID) | ORAL | 0 refills | Status: DC
Start: 2023-05-06 — End: 2023-06-28

## 2023-04-04 NOTE — Progress Notes (Unsigned)
 PROVIDER NOTE: Information contained herein reflects review and annotations entered in association with encounter. Interpretation of such information and data should be left to medically-trained personnel. Information provided to patient can be located elsewhere in the medical record under "Patient Instructions". Document created using STT-dictation technology, any transcriptional errors that may result from process are unintentional.    Patient: Tiffany Herring  Service Category: E/M  Provider: Oswaldo Done, MD  DOB: Jun 01, 1960  DOS: 04/04/2023  Referring Provider: Rolm Gala, MD  MRN: [id number]  Specialty: Interventional Pain Management  PCP: Rolm Gala, MD  Type: Established Patient  Setting: Ambulatory outpatient    Location: Office  Delivery: Face-to-face     HPI  Ms. Felissa Blouch, a 63 y.o. year old female, is here today because of her Chronic pain syndrome [G89.4]. Ms. Rohrer primary complain today is Hip Pain (right)  Pertinent problems: Ms. Snavely has Failed back surgical syndrome; Epidural fibrosis; Chronic low back pain (Bilateral) w/ sciatica (Right); Chronic lumbar radicular pain (Right); Generalized OA; Multiple sclerosis (HCC); Degenerative arthritis of hip; Fibromyalgia; Lumbar spondylosis; Grade 1 Anterolisthesis of L4/L5; Chronic sacroiliac joint pain (bilateral); Chronic hip pain (Left); Arthropathy of left hip (Severe chronic left hip DJD); Osteoarthritis of hip (Left); Lumbar facet arthropathy (HCC); Lumbar facet hypertrophy; Lumbar facet syndrome (Bilateral); Chronic flank pain (Right); Chronic lower extremity pain (Right); Chronic pain syndrome; Venous stasis ulcer (HCC); OA (osteoarthritis); Osteoarthrosis, hip; Generalized osteoarthritis of multiple sites; and DDD (degenerative disc disease), lumbosacral on their pertinent problem list. Pain Assessment: Severity of Chronic pain is reported as a 6 /10. Location: Hip Right/right upper leg to the knee. Onset:  More than a month ago. Quality: Stabbing. Timing: Intermittent. Modifying factor(s): medications. Vitals:  height is 5\' 1"  (1.549 m) and weight is 224 lb (101.6 kg). Her temporal temperature is 97.8 F (36.6 C). Her blood pressure is 116/61 and her pulse is 89. Her respiration is 16 and oxygen saturation is 100%.  BMI: Estimated body mass index is 42.32 kg/m as calculated from the following:   Height as of this encounter: 5\' 1"  (1.549 m).   Weight as of this encounter: 224 lb (101.6 kg). Last encounter: 01/01/2023. Last procedure: Visit date not found.  Reason for encounter: medication management.  The patient indicates doing well with the current medication regimen. No adverse reactions or side effects reported to the medications.   Discussed the use of AI scribe software for clinical note transcription with the patient, who gave verbal consent to proceed.  History of Present Illness   Tiffany Herring is a 63 year old female who presents with chronic pain management follow-up.  Her primary complaint is chronic pain, predominantly in the right hip, which is the most severe location of her pain.  Her current pain medication regimen is effective in managing her symptoms, providing significant relief, especially in the right hip. She finds the medication crucial for her daily functioning.  She is not experiencing any side effects or adverse reactions from her pain medication. Her pharmacy remains unchanged, and there are no issues with her prescription monitoring program or urine drug testing, both of which are up to date.     RTCB: 07/05/2023   Pharmacotherapy Assessment  Analgesic: MSIR 15 mg, 1 tab PO TID. "Drug Holiday" completed on 02/23/2021.  Micah Flesher from MME of 110 mg/day to 45 mg/day). MME/day: 45 mg/day   Monitoring: [place] PMP: PDMP reviewed during this encounter.       Pharmacotherapy: No side-effects or  adverse reactions reported. Compliance: No problems  identified. Effectiveness: Clinically acceptable.  Concepcion Elk, RN  04/04/2023  1:36 PM  Sign when Signing Visit Nursing Pain Medication Assessment:  Safety precautions to be maintained throughout the outpatient stay will include: orient to surroundings, keep bed in low position, maintain call bell within reach at all times, provide assistance with transfer out of bed and ambulation.  Medication Inspection Compliance: Pill count conducted under aseptic conditions, in front of the patient. Neither the pills nor the bottle was removed from the patient's sight at any time. Once count was completed pills were immediately returned to the patient in their original bottle.  Medication: Morphine IR Pill/Patch Count:  02 of 90 pills remain Pill/Patch Appearance: Markings consistent with prescribed medication Bottle Appearance: Standard pharmacy container. Clearly labeled. Filled Date: 2 / 23 / 2025 Last Medication intake:  Today    No results found for: "CBDTHCR" No results found for: "D8THCCBX" No results found for: "D9THCCBX"  UDS:  Summary  Date Value Ref Range Status  10/03/2022 Note  Final    Comment:    ==================================================================== Compliance Drug Analysis, Ur ==================================================================== Test                             Result       Flag       Units  Drug Present and Declared for Prescription Verification   Morphine                       >[id number]       EXPECTED   ng/mg creat   Normorphine                    519          EXPECTED   ng/mg creat    Potential sources of large amounts of morphine in the absence of    codeine include administration of morphine or use of heroin.     Normorphine is an expected metabolite of morphine.    Hydromorphone                  167          EXPECTED   ng/mg creat    Hydromorphone may be present as a metabolite of morphine;    concentrations of hydromorphone rarely  exceed 5% of the morphine    concentration when this is the source of hydromorphone.    Gabapentin                     PRESENT      EXPECTED   Bupropion                      PRESENT      EXPECTED   Hydroxybupropion               PRESENT      EXPECTED    Hydroxybupropion is an expected metabolite of bupropion.    Mirtazapine                    PRESENT      EXPECTED  Drug Present not Declared for Prescription Verification   Ephedrine/Pseudoephedrine      PRESENT      UNEXPECTED   Phenylpropanolamine            PRESENT  UNEXPECTED    Source of ephedrine/pseudoephedrine is most commonly pseudoephedrine    in over-the-counter or prescription cold and allergy medications.    Phenylpropanolamine is an expected metabolite of    ephedrine/pseudoephedrine.  Drug Absent but Declared for Prescription Verification   Tizanidine                     Not Detected UNEXPECTED    Tizanidine, as indicated in the declared medication list, is not    always detected even when used as directed.    Salicylate                     Not Detected UNEXPECTED    Aspirin, as indicated in the declared medication list, is not always    detected even when used as directed.  ==================================================================== Test                      Result    Flag   Units      Ref Range   Creatinine              70               mg/dL      >=16 ==================================================================== Declared Medications:  The flagging and interpretation on this report are based on the  following declared medications.  Unexpected results may arise from  inaccuracies in the declared medications.   **Note: The testing scope of this panel includes these medications:   Bupropion (Wellbutrin)  Gabapentin (Neurontin)  Mirtazapine (Remeron)  Morphine (MSIR)   **Note: The testing scope of this panel does not include small to  moderate amounts of these reported medications:    Aspirin  Tizanidine (Zanaflex)   **Note: The testing scope of this panel does not include the  following reported medications:   Albuterol (Ventolin HFA)  Cholecalciferol  Clotrimazole (Mycelex)  Dapagliflozin (Farxiga)  Enalapril (Vasotec)  Furosemide (Lasix)  Insulin (Lantus)  Latanoprost (Xalatan)  Montelukast (Singulair)  Naloxone (Narcan)  Oxybutynin (Ditropan)  Timolol (Timoptic)  Vitamin B12 ==================================================================== For clinical consultation, please call [phone]. ====================================================================       ROS  Constitutional: Denies any fever or chills Gastrointestinal: No reported hemesis, hematochezia, vomiting, or acute GI distress Musculoskeletal: Denies any acute onset joint swelling, redness, loss of ROM, or weakness Neurological: No reported episodes of acute onset apraxia, aphasia, dysarthria, agnosia, amnesia, paralysis, loss of coordination, or loss of consciousness  Medication Review  BLOOD GLUCOSE TEST STRIPS, Cholecalciferol, Fifty50 Glucose Meter 2.0, FreeStyle Libre 3 Sensor, Insulin Syringe-Needle U-100, accu-chek multiclix, albuterol, aspirin, buPROPion, clotrimazole, cyanocobalamin, dapagliflozin propanediol, enalapril, furosemide, gabapentin, glucose blood, insulin glargine, latanoprost, mirtazapine, montelukast, morphine, mupirocin ointment, naloxone, oxybutynin, tiZANidine, and timolol  History Review  Allergy: Ms. Delellis is allergic to amoxicillin, amoxicillin-pot clavulanate, copaxone  [glatiramer acetate], glatiramer, pseudoephedrine, rofecoxib, sulfa antibiotics, and meloxicam. Drug: Ms. Kawa  reports no history of drug use. Alcohol:  reports no history of alcohol use. Tobacco:  reports that she has quit smoking. She has never used smokeless tobacco. Social: Ms. Cissell  reports that she has quit smoking. She has never used smokeless tobacco. She reports  that she does not drink alcohol and does not use drugs. Medical:  has a past medical history of Allergy, Arthritis, Asthma, Chronic kidney disease, Depression, Diabetes mellitus without complication (HCC), Hypertension, Multiple sclerosis (HCC), Neuromuscular disorder (HCC), and Sleep apnea. Surgical: Ms. Pasquini  has a past surgical history that includes  Cesarean section (1991 and 1996). Family: family history includes Dementia in her mother; Depression in her mother; Diabetes in her mother; Hyperlipidemia in her mother; Stroke in her father.  Laboratory Chemistry Profile   Renal Lab Results  Component Value Date   BUN 18 02/06/2018   CREATININE 1.22 (H) 02/06/2018   BCR 15 02/06/2018   GFRAA 56 (L) 02/06/2018   GFRNONAA 49 (L) 02/06/2018    Hepatic Lab Results  Component Value Date   AST 25 02/06/2018   ALT 19 02/15/2015   ALBUMIN 4.6 02/06/2018   ALKPHOS 97 02/06/2018    Electrolytes Lab Results  Component Value Date   NA 136 02/06/2018   K 4.9 02/06/2018   CL 95 (L) 02/06/2018   CALCIUM 9.8 02/06/2018   MG 1.8 02/06/2018    Bone Lab Results  Component Value Date   25OHVITD1 55 02/06/2018   25OHVITD2 2.9 02/06/2018   25OHVITD3 52 02/06/2018    Inflammation (CRP: Acute Phase) (ESR: Chronic Phase) Lab Results  Component Value Date   CRP 17 (H) 02/06/2018   ESRSEDRATE 28 02/06/2018         Note: Above Lab results reviewed.  Recent Imaging Review  US RENAL CLINICAL DATA:  Chronic renal disease  EXAM: RENAL / URINARY TRACT ULTRASOUND COMPLETE  COMPARISON:  None Available.  FINDINGS: Right Kidney:  Renal measurements: 10.3 x 4.9 x 5.4 cm = volume: 143 mL. Diffuse increased echogenicity.  Left Kidney:  Renal measurements: 9.9 x 4.6 x 5.2 cm = volume: 125 mL. Diffuse increased echogenicity.  Bladder:  Appears normal for degree of bladder distention.  Other:  None.  IMPRESSION: Diffuse increased echogenicity in both kidneys is consistent  with medical renal disease. No other abnormalities.  Electronically Signed   By: Gerome Sam III M.D.   On: 11/25/2021 14:49 Note: Reviewed        Physical Exam  General appearance: Well nourished, well developed, and well hydrated. In no apparent acute distress Mental status: Alert, oriented x 3 (person, place, & time)       Respiratory: No evidence of acute respiratory distress Eyes: PERLA Vitals: BP 116/61 (Cuff Size: Large)   Pulse 89   Temp 97.8 F (36.6 C) (Temporal)   Resp 16   Ht 5\' 1"  (1.549 m)   Wt 224 lb (101.6 kg)   SpO2 100%   BMI 42.32 kg/m  BMI: Estimated body mass index is 42.32 kg/m as calculated from the following:   Height as of this encounter: 5\' 1"  (1.549 m).   Weight as of this encounter: 224 lb (101.6 kg). Ideal: Ideal body weight: 47.8 kg (105 lb 6.1 oz) Adjusted ideal body weight: 69.3 kg (152 lb 13.3 oz)  Assessment   Diagnosis Status  1. Chronic pain syndrome   2. Chronic low back pain (Bilateral) w/ sciatica (Right)   3. Chronic lower extremity pain (Right)   4. Failed back surgical syndrome   5. Generalized osteoarthritis of multiple sites   6. Multiple sclerosis (HCC)   7. Chronic sacroiliac joint pain (bilateral)   8. Lumbar facet syndrome (Bilateral)   9. Chronic hip pain (Left)   10. Pharmacologic therapy   11. Chronic use of opiate for therapeutic purpose   12. Encounter for medication management   13. Encounter for chronic pain management    Controlled Controlled Controlled   Updated Problems: No problems updated.  Plan of Care  Problem-specific:  Assessment and Plan    Chronic right hip pain  Her chronic right hip pain is effectively managed with medication, showing no side effects or adverse reactions. Urine drug test and prescription monitoring are current and show no issues. Continue current pain medication regimen. Send refills to the pharmacy for the next three months. Schedule follow-up appointment.      Ms.  Rayann Jolley has a current medication list which includes the following long-term medication(s): albuterol, bupropion, furosemide, gabapentin, insulin glargine, mirtazapine, montelukast, [START ON 04/06/2023] morphine, [START ON 05/06/2023] morphine, and [START ON 06/05/2023] morphine.  Pharmacotherapy (Medications Ordered): Meds ordered this encounter  Medications   morphine (MSIR) 15 MG tablet    Sig: Take 1 tablet (15 mg total) by mouth every 8 (eight) hours. Must last 30 days.    Dispense:  90 tablet    Refill:  0    DO NOT: delete (not duplicate); no partial-fill (will deny script to complete), no refill request (F/U required). DISPENSE: 1 day early if closed on fill date. WARN: No CNS-depressants within 8 hrs of med.   morphine (MSIR) 15 MG tablet    Sig: Take 1 tablet (15 mg total) by mouth every 8 (eight) hours. Must last 30 days.    Dispense:  90 tablet    Refill:  0    DO NOT: delete (not duplicate); no partial-fill (will deny script to complete), no refill request (F/U required). DISPENSE: 1 day early if closed on fill date. WARN: No CNS-depressants within 8 hrs of med.   morphine (MSIR) 15 MG tablet    Sig: Take 1 tablet (15 mg total) by mouth every 8 (eight) hours. Must last 30 days.    Dispense:  90 tablet    Refill:  0    DO NOT: delete (not duplicate); no partial-fill (will deny script to complete), no refill request (F/U required). DISPENSE: 1 day early if closed on fill date. WARN: No CNS-depressants within 8 hrs of med.   Orders:  No orders of the defined types were placed in this encounter.  Follow-up plan:   Return in about 3 months (around 07/05/2023) for Eval-day (M,W), (F2F), (MM).      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 Total daily dose brought down from 110 MME/day to 60. Oxycodone (ineffective).     Recent Visits Date Type Provider Dept  04/04/23 Office Visit Delano Metz, MD [facility]  Showing recent visits within past 90 days and meeting all other requirements Future Appointments Date Type Provider Dept  07/02/23 Appointment Delano Metz, MD [facility]  Showing future appointments within next 90 days and meeting all other requirements  I discussed the assessment and treatment plan with the patient. The patient was provided an opportunity to ask questions and all were answered. The patient agreed with the plan and demonstrated an understanding of the instructions.  Patient advised to call back or seek an in-person evaluation if the symptoms or condition worsens.  Duration of encounter: 30 minutes.  Total time on encounter, as per AMA guidelines included both the face-to-face and non-face-to-face time personally spent by the physician and/or other qualified health care professional(s) on the day of the encounter (includes time in activities that  require the physician or other qualified health care professional and does not include time in activities normally performed by clinical staff). Physician's time may include the following activities when performed: Preparing to see the patient (e.g., pre-charting review of records, searching for previously ordered imaging, lab work, and nerve conduction tests) Review of prior analgesic pharmacotherapies. Reviewing PMP Interpreting ordered tests (e.g., lab work, imaging, nerve conduction tests) Performing post-procedure evaluations, including interpretation of diagnostic procedures Obtaining and/or reviewing separately obtained history Performing a medically appropriate examination and/or evaluation Counseling and educating the patient/family/caregiver Ordering medications, tests, or  procedures Referring and communicating with other health care professionals (when not separately reported) Documenting clinical information in the electronic or other health record Independently interpreting results (not separately reported) and communicating results to the patient/ family/caregiver Care coordination (not separately reported)  Note by: Oswaldo Done, MD Date: 04/04/2023; Time: 10:29 AM

## 2023-04-04 NOTE — Progress Notes (Unsigned)
 Nursing Pain Medication Assessment:  Safety precautions to be maintained throughout the outpatient stay will include: orient to surroundings, keep bed in low position, maintain call bell within reach at all times, provide assistance with transfer out of bed and ambulation.  Medication Inspection Compliance: Pill count conducted under aseptic conditions, in front of the patient. Neither the pills nor the bottle was removed from the patient's sight at any time. Once count was completed pills were immediately returned to the patient in their original bottle.  Medication: Morphine IR Pill/Patch Count:  02 of 90 pills remain Pill/Patch Appearance: Markings consistent with prescribed medication Bottle Appearance: Standard pharmacy container. Clearly labeled. Filled Date: 29 / 23 / 2025 Last Medication intake:  Today

## 2023-04-04 NOTE — Patient Instructions (Signed)

## 2023-07-02 ENCOUNTER — Encounter: Payer: Self-pay | Admitting: Nurse Practitioner

## 2023-07-02 ENCOUNTER — Ambulatory Visit: Attending: Pain Medicine | Admitting: Nurse Practitioner

## 2023-07-02 DIAGNOSIS — M5441 Lumbago with sciatica, right side: Secondary | ICD-10-CM | POA: Diagnosis not present

## 2023-07-02 DIAGNOSIS — G894 Chronic pain syndrome: Secondary | ICD-10-CM | POA: Diagnosis present

## 2023-07-02 DIAGNOSIS — G8929 Other chronic pain: Secondary | ICD-10-CM | POA: Insufficient documentation

## 2023-07-02 DIAGNOSIS — M47816 Spondylosis without myelopathy or radiculopathy, lumbar region: Secondary | ICD-10-CM | POA: Diagnosis present

## 2023-07-02 DIAGNOSIS — Z79891 Long term (current) use of opiate analgesic: Secondary | ICD-10-CM | POA: Diagnosis present

## 2023-07-02 DIAGNOSIS — G35 Multiple sclerosis: Secondary | ICD-10-CM | POA: Diagnosis present

## 2023-07-02 DIAGNOSIS — M533 Sacrococcygeal disorders, not elsewhere classified: Secondary | ICD-10-CM | POA: Diagnosis present

## 2023-07-02 DIAGNOSIS — M159 Polyosteoarthritis, unspecified: Secondary | ICD-10-CM | POA: Diagnosis not present

## 2023-07-02 DIAGNOSIS — M79604 Pain in right leg: Secondary | ICD-10-CM | POA: Diagnosis not present

## 2023-07-02 DIAGNOSIS — M25552 Pain in left hip: Secondary | ICD-10-CM | POA: Diagnosis present

## 2023-07-02 DIAGNOSIS — Z79899 Other long term (current) drug therapy: Secondary | ICD-10-CM | POA: Diagnosis present

## 2023-07-02 DIAGNOSIS — M961 Postlaminectomy syndrome, not elsewhere classified: Secondary | ICD-10-CM | POA: Diagnosis not present

## 2023-07-02 MED ORDER — MORPHINE SULFATE 15 MG PO TABS: 15.0000 mg | ORAL_TABLET | Freq: Three times a day (TID) | ORAL | 0 refills | Status: DC

## 2023-07-02 NOTE — Progress Notes (Signed)
.    Medication Inspection Compliance: Tiffany Herring did not comply with our request to bring her pills to be counted. She was reminded that bringing the medication bottles, even when empty, is a requirement.  Medication: None brought in. Pill/Patch Count: None available to be counted. Bottle Appearance: No container available. Did not bring bottle(s) to appointment. Filled Date: N/A Last Medication intake:  TodaySafety precautions to be maintained throughout the outpatient stay will include: orient to surroundings, keep bed in low position, maintain call bell within reach at all times, provide assistance with transfer out of bed and ambulation.

## 2023-07-02 NOTE — Progress Notes (Signed)
 PROVIDER NOTE: Interpretation of information contained herein should be left to medically-trained personnel. Specific patient instructions are provided elsewhere under Patient Instructions section of medical record. This document was created in part using AI and STT-dictation technology, any transcriptional errors that may result from this process are unintentional.  Patient: Tiffany Herring  Service: E/M   PCP: Tiffany Loader, MD  DOB: [birth year]  DOS: 07/02/2023  Provider: Emmy MARLA Blanch, NP  MRN: [id number]  Delivery: Face-to-face  Specialty: Interventional Pain Management  Type: Established Patient  Setting: Ambulatory outpatient facility  Specialty designation: 09  Referring Prov.: Tiffany Loader, MD  Location: Outpatient office facility       History of present illness (HPI) [name], a 63 y.o. year old female, is here today because of her No primary diagnosis found.. 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  Pain Assessment: Severity of Chronic pain is reported as a 3 /10. Location: Back Lower/radiates down the front of right leg to knee. Onset: More than a month ago. Quality: Sharp. Timing: Constant. Modifying factor(s): meds. Vitals:  height is 5' 1 (1.549 m) and weight is 208 lb (94.3 kg). Her temperature is 96.8 F (36 C) (abnormal). Her blood pressure is 115/79 and her pulse is 75. Her respiration is 16 and oxygen  saturation is 99%.  BMI: Estimated body mass index is 39.3 kg/m as calculated from the following:   Height as of this encounter: 5' 1 (1.549 m).   Weight as of this encounter: 208 lb (94.3 kg).  Last encounter: 04/04/2023 Last procedure:  Visit date not found.  Reason for encounter: medication management.  The patient indicates doing well with current medication regimen.  No side effects or adverse reaction reported to medication.   The patient complains of low back pain; however her current medication regimen is effective in managing her chronic pain symptoms and provides significant pain relief.  She forgot to bring her pill bottle for the pill count; therefore a 1 month supply of medication was provided.  She was reminded to bring her pill bottle at the next visit in order to continue receiving a 90-day supply.  Pharmacotherapy Assessment   Analgesic: Morphine  15 mg tablet every 8 hours as needed for pain. MME=45 Monitoring: [place] PMP: PDMP reviewed during this encounter.       Pharmacotherapy: No side-effects or adverse reactions reported. Compliance: No problems identified. Effectiveness: Clinically acceptable.  Tiffany Wolm HERO, RN  07/02/2023 10:48 AM  Sign when Signing Visit .  Medication Inspection Compliance: Tiffany Herring did not comply with our request to bring her pills to be counted. She was reminded that bringing the medication bottles, even when empty, is a requirement.  Medication: None brought in. Pill/Patch Count: None available to be counted. Bottle Appearance: No container available. Did not bring bottle(s) to appointment. Filled Date: N/A Last Medication intake:  TodaySafety precautions to be maintained throughout the outpatient stay will include: orient to surroundings, keep bed in low position, maintain call bell within reach at all times, provide assistance with transfer out of bed and ambulation.    UDS:  Summary  Date Value Ref Range Status  10/03/2022 Note  Final    Comment:    ==================================================================== Compliance Drug Analysis,  Ur ==================================================================== Test                             Result       Flag        Units  Drug Present and Declared for Prescription Verification   Morphine                        >14286       EXPECTED   ng/mg creat   Normorphine                    519          EXPECTED   ng/mg creat    Potential sources of large amounts of morphine  in the absence of    codeine include administration of morphine  or use of heroin.     Normorphine is an expected metabolite of morphine .    Hydromorphone                  167          EXPECTED   ng/mg creat    Hydromorphone may be present as a metabolite of morphine ;    concentrations of hydromorphone rarely exceed 5% of the morphine     concentration when this is the source of hydromorphone.    Gabapentin                     PRESENT      EXPECTED   Bupropion                      PRESENT      EXPECTED   Hydroxybupropion               PRESENT      EXPECTED    Hydroxybupropion is an expected metabolite of bupropion.    Mirtazapine                    PRESENT      EXPECTED  Drug Present not Declared for Prescription Verification   Ephedrine/Pseudoephedrine      PRESENT      UNEXPECTED   Phenylpropanolamine            PRESENT      UNEXPECTED    Source of ephedrine/pseudoephedrine is most commonly pseudoephedrine    in over-the-counter or prescription cold and allergy medications.    Phenylpropanolamine is an expected metabolite of    ephedrine/pseudoephedrine.  Drug Absent but Declared for Prescription Verification   Tizanidine                     Not Detected UNEXPECTED    Tizanidine, as indicated in the declared medication list, is not    always detected even when used as directed.    Salicylate                     Not Detected UNEXPECTED    Aspirin, as indicated in the declared medication list, is not always    detected even when used as directed.  ==================================================================== Test                      Result    Flag   Units      Ref Range   Creatinine  70               mg/dL       >=79 ==================================================================== Declared Medications:  The flagging and interpretation on this report are based on the  following declared medications.  Unexpected results may arise from  inaccuracies in the declared medications.   **Note: The testing scope of this panel includes these medications:   Bupropion (Wellbutrin)  Gabapentin (Neurontin)  Mirtazapine (Remeron)  Morphine  (MSIR)   **Note: The testing scope of this panel does not include small to  moderate amounts of these reported medications:   Aspirin  Tizanidine (Zanaflex)   **Note: The testing scope of this panel does not include the  following reported medications:   Albuterol (Ventolin HFA)  Cholecalciferol  Clotrimazole (Mycelex)  Dapagliflozin (Farxiga)  Enalapril (Vasotec)  Furosemide (Lasix)  Insulin (Lantus)  Latanoprost (Xalatan)  Montelukast (Singulair)  Naloxone  (Narcan )  Oxybutynin (Ditropan)  Timolol (Timoptic)  Vitamin B12 ==================================================================== For clinical consultation, please call [phone]. ====================================================================     No results found for: CBDTHCR No results found for: D8THCCBX No results found for: D9THCCBX  ROS  Constitutional: Denies any fever or chills Gastrointestinal: No reported hemesis, hematochezia, vomiting, or acute GI distress Musculoskeletal: Low back pain Neurological: No reported episodes of acute onset apraxia, aphasia, dysarthria, agnosia, amnesia, paralysis, loss of coordination, or loss of consciousness  Medication Review  BLOOD GLUCOSE TEST STRIPS, Cholecalciferol, Fifty50 Glucose Meter 2.0, FreeStyle Libre 3 Sensor, Insulin Aspart FlexPen, Insulin Syringe-Needle U-100, accu-chek multiclix, albuterol, aspirin, buPROPion, cephALEXin, clotrimazole, cyanocobalamin, dapagliflozin propanediol, enalapril, furosemide, gabapentin,  glucose blood, insulin glargine, latanoprost, mirtazapine, montelukast, morphine , mupirocin ointment, naloxone , oxybutynin, tiZANidine, and timolol  History Review  Allergy: Ms. Mara is allergic to amoxicillin, amoxicillin-pot clavulanate, copaxone  [glatiramer acetate], glatiramer, pseudoephedrine, rofecoxib, sulfa antibiotics, and meloxicam. Drug: Ms. Mezquita  reports no history of drug use. Alcohol:  reports no history of alcohol use. Tobacco:  reports that she has quit smoking. She has never used smokeless tobacco. Social: Ms. Rayburn  reports that she has quit smoking. She has never used smokeless tobacco. She reports that she does not drink alcohol and does not use drugs. Medical:  has a past medical history of Allergy, Arthritis, Asthma, Chronic kidney disease, Depression, Diabetes mellitus without complication (HCC), Hypertension, Multiple sclerosis (HCC), Neuromuscular disorder (HCC), and Sleep apnea. Surgical: Ms. Quincy  has a past surgical history that includes Cesarean section (1991 and 1996). Family: family history includes Dementia in her mother; Depression in her mother; Diabetes in her mother; Hyperlipidemia in her mother; Stroke in her father.  Laboratory Chemistry Profile   Renal Lab Results  Component Value Date   BUN 18 02/06/2018   CREATININE 1.22 (H) 02/06/2018   BCR 15 02/06/2018   GFRAA 56 (L) 02/06/2018   GFRNONAA 49 (L) 02/06/2018    Hepatic Lab Results  Component Value Date   AST 25 02/06/2018   ALT 19 02/15/2015   ALBUMIN 4.6 02/06/2018   ALKPHOS 97 02/06/2018    Electrolytes Lab Results  Component Value Date   NA 136 02/06/2018   K 4.9 02/06/2018   CL 95 (L) 02/06/2018   CALCIUM 9.8 02/06/2018   MG 1.8 02/06/2018    Bone Lab Results  Component Value Date   25OHVITD1 55 02/06/2018   25OHVITD2 2.9 02/06/2018   25OHVITD3 52 02/06/2018    Inflammation (CRP: Acute Phase) (ESR: Chronic Phase) Lab Results  Component Value Date   CRP 17  (H) 02/06/2018   ESRSEDRATE 28  02/06/2018         Note: Above Lab results reviewed.  Recent Imaging Review  US  RENAL CLINICAL DATA:  Chronic renal disease  EXAM: RENAL / URINARY TRACT ULTRASOUND COMPLETE  COMPARISON:  None Available.  FINDINGS: Right Kidney:  Renal measurements: 10.3 x 4.9 x 5.4 cm = volume: 143 mL. Diffuse increased echogenicity.  Left Kidney:  Renal measurements: 9.9 x 4.6 x 5.2 cm = volume: 125 mL. Diffuse increased echogenicity.  Bladder:  Appears normal for degree of bladder distention.  Other:  None.  IMPRESSION: Diffuse increased echogenicity in both kidneys is consistent with medical renal disease. No other abnormalities.  Electronically Signed   By: Alm Pouch III M.D.   On: 11/25/2021 14:49 Note: Reviewed        Physical Exam  General appearance: Well nourished, well developed, and well hydrated. In no apparent acute distress Mental status: Alert, oriented x 3 (person, place, & time)       Respiratory: No evidence of acute respiratory distress Eyes: PERLA Vitals: BP 115/79   Pulse 75   Temp (!) 96.8 F (36 C)   Resp 16   Ht 5' 1 (1.549 m)   Wt 208 lb (94.3 kg)   SpO2 99%   BMI 39.30 kg/m  BMI: Estimated body mass index is 39.3 kg/m as calculated from the following:   Height as of this encounter: 5' 1 (1.549 m).   Weight as of this encounter: 208 lb (94.3 kg). Ideal: Ideal body weight: 47.8 kg (105 lb 6.1 oz) Adjusted ideal body weight: 66.4 kg (146 lb 6.8 oz)  Assessment   Diagnosis Status  1. Chronic low back pain (Bilateral) w/ sciatica (Right)   2. Chronic lower extremity pain (Right)   3. Failed back surgical syndrome   4. Generalized osteoarthritis of multiple sites   5. Multiple sclerosis (HCC)   6. Chronic sacroiliac joint pain (bilateral)   7. Lumbar facet syndrome (Bilateral)   8. Chronic hip pain (Left)   9. Chronic pain syndrome   10. Pharmacologic therapy   11. Chronic use of opiate for  therapeutic purpose   12. Encounter for medication management   13. Encounter for chronic pain management    Controlled Controlled Controlled   Updated Problems: No problems updated.  Plan of Care  Problem-specific:  Assessment and Plan Will continue on current medication regimen.  Prescribing drug monitoring (PDMP) reviewed; findings consistent with the use of prescribed medication and no evidence of narcotic misuse or abuse.  Urine drug screening (UDS) up-to-date.  Schedule follow-up in 30 days for medication management.  Ms. Inika Bellanger has a current medication list which includes the following long-term medication(s): albuterol, bupropion, furosemide, gabapentin, insulin aspart flexpen, mirtazapine, montelukast, insulin glargine, and [START ON 07/05/2023] morphine .  Pharmacotherapy (Medications Ordered): Meds ordered this encounter  Medications   morphine  (MSIR) 15 MG tablet    Sig: Take 1 tablet (15 mg total) by mouth every 8 (eight) hours. Must last 30 days.    Dispense:  90 tablet    Refill:  0    DO NOT: delete (not duplicate); no partial-fill (will deny script to complete), no refill request (F/U required). DISPENSE: 1 day early if closed on fill date. WARN: No CNS-depressants within 8 hrs of med.   Orders:  No orders of the defined types were placed in this encounter.       Return in about 1 month (around 08/01/2023) for (F2F), (MM), Tiffany Blanch NP.  Recent Visits Date Type Provider Dept  04/04/23 Office Visit Tanya Glisson, MD [facility]  Showing recent visits within past 90 days and meeting all other requirements Today's Visits Date Type Provider Dept  07/02/23 Office Visit Saxon Crosby K, NP [facility]  Showing today's visits and meeting all other requirements Future Appointments Date Type Provider Dept  07/30/23 Appointment Dymond Spreen K, NP [facility]  Showing future appointments within next 90 days and meeting  all other requirements  I discussed the assessment and treatment plan with the patient. The patient was provided an opportunity to ask questions and all were answered. The patient agreed with the plan and demonstrated an understanding of the instructions.  Patient advised to call back or seek an in-person evaluation if the symptoms or condition worsens.  Duration of encounter: 30 minutes.  Total time on encounter, as per AMA guidelines included both the face-to-face and non-face-to-face time personally spent by the physician and/or other qualified health care professional(s) on the day of the encounter (includes time in activities that require the physician or other qualified health care professional and does not include time in activities normally performed by clinical staff). Physician's time may include the following activities when performed: Preparing to see the patient (e.g., pre-charting review of records, searching for previously ordered imaging, lab work, and nerve conduction tests) Review of prior analgesic pharmacotherapies. Reviewing PMP Interpreting ordered tests (e.g., lab work, imaging, nerve conduction tests) Performing post-procedure evaluations, including interpretation of diagnostic procedures Obtaining and/or reviewing separately obtained history Performing a medically appropriate examination and/or evaluation Counseling and educating the patient/family/caregiver Ordering medications, tests, or procedures Referring and communicating with other health care professionals (when not separately reported) Documenting clinical information in the electronic or other health record Independently interpreting results (not separately reported) and communicating results to the patient/ family/caregiver Care coordination (not separately reported)  Note by: Preston Garabedian K Aviella Disbrow, NP (TTS and AI technology used. I apologize for any typographical errors that were not detected and corrected.) Date:  07/02/2023; Time: 11:11 AM

## 2023-07-30 ENCOUNTER — Encounter: Payer: Self-pay | Admitting: Nurse Practitioner

## 2023-07-30 ENCOUNTER — Ambulatory Visit: Attending: Nurse Practitioner | Admitting: Nurse Practitioner

## 2023-07-30 VITALS — BP 115/53 | HR 83 | Ht 61.0 in | Wt 208.0 lb

## 2023-07-30 DIAGNOSIS — Z79899 Other long term (current) drug therapy: Secondary | ICD-10-CM | POA: Diagnosis present

## 2023-07-30 DIAGNOSIS — M961 Postlaminectomy syndrome, not elsewhere classified: Secondary | ICD-10-CM | POA: Diagnosis present

## 2023-07-30 DIAGNOSIS — G894 Chronic pain syndrome: Secondary | ICD-10-CM | POA: Insufficient documentation

## 2023-07-30 DIAGNOSIS — M533 Sacrococcygeal disorders, not elsewhere classified: Secondary | ICD-10-CM | POA: Insufficient documentation

## 2023-07-30 DIAGNOSIS — M5441 Lumbago with sciatica, right side: Secondary | ICD-10-CM | POA: Insufficient documentation

## 2023-07-30 DIAGNOSIS — M25552 Pain in left hip: Secondary | ICD-10-CM | POA: Diagnosis present

## 2023-07-30 DIAGNOSIS — M159 Polyosteoarthritis, unspecified: Secondary | ICD-10-CM | POA: Diagnosis present

## 2023-07-30 DIAGNOSIS — G8929 Other chronic pain: Secondary | ICD-10-CM | POA: Diagnosis present

## 2023-07-30 DIAGNOSIS — M79604 Pain in right leg: Secondary | ICD-10-CM | POA: Insufficient documentation

## 2023-07-30 MED ORDER — HYDROCODONE-ACETAMINOPHEN 5-325 MG PO TABS: 1.0000 | ORAL_TABLET | Freq: Three times a day (TID) | ORAL | 0 refills | Status: DC | PRN

## 2023-07-30 NOTE — Progress Notes (Signed)
 Nursing Pain Medication Assessment:  Safety precautions to be maintained throughout the outpatient stay will include: orient to surroundings, keep bed in low position, maintain call bell within reach at all times, provide assistance with transfer out of bed and ambulation.  Medication Inspection Compliance: Pill count conducted under aseptic conditions, in front of the patient. Neither the pills nor the bottle was removed from the patient's sight at any time. Once count was completed pills were immediately returned to the patient in their original bottle.  Medication: Morphine  IR Pill/Patch Count: 20 of 90 pills/patches remain Pill/Patch Appearance: Markings consistent with prescribed medication Bottle Appearance: Standard pharmacy container. Clearly labeled. Filled Date: 06 / 26 / 2025 Last Medication intake:  Today

## 2023-07-30 NOTE — Progress Notes (Signed)
 PROVIDER NOTE: Interpretation of information contained herein should be left to medically-trained personnel. Specific patient instructions are provided elsewhere under Patient Instructions section of medical record. This document was created in part using AI and STT-dictation technology, any transcriptional errors that may result from this process are unintentional.  Patient: Tiffany Herring  Service: E/M   PCP: Rojelio Loader, MD  DOB: [birth year]  DOS: 07/30/2023  Provider: Emmy MARLA Blanch, NP  MRN: [id number]  Delivery: Face-to-face  Specialty: Interventional Pain Management  Type: Established Patient  Setting: Ambulatory outpatient facility  Specialty designation: 09  Referring Prov.: Rojelio Loader, MD  Location: Outpatient office facility       History of present illness (HPI) [name], a 63 y.o. year old female, is here today because of her Chronic bilateral low back pain with right-sided sciatica [G89.29, M54.41]. [name] primary complain today is Back Pain (Radiates down right leg to knee)  Pertinent problems: 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  Pain Assessment: Severity of Chronic pain is reported as a 5 /10. Location: Back Lower, Right/Radiates down the front of right leg to knee. Onset: More than a month ago. Quality: Sharp. Timing: Constant. Modifying factor(s): Medication. Vitals:  height is 5' 1 (1.549 m) and weight is 208 lb (94.3 kg). Her blood pressure is 115/53 (abnormal) and her pulse is 83. Her oxygen  saturation is 98%.  BMI: Estimated body mass index is 39.3 kg/m as calculated from the following:   Height as of this encounter: 5' 1 (1.549 m).   Weight as of this encounter: 208 lb (94.3 kg).  Last encounter:  07/02/2023. Last procedure: Visit date not found.  Reason for encounter: medication management.  The patient indicates doing well with current medication regimen.  No side effects or adverse reaction reported to medication.  The patient is currently unable to obtain morphine  due to her pharmacy backorder or nationwide shortage.  We discussed alternative medication options for medication management.  After reviewing the option, we agreed to start hydrocodone -acetaminophen  (Norco/Vicodine) 5-325 mg three times daily as needed for pain management.  This will be a temporary regimen for up to 30 days.  Pharmacotherapy Assessment   Analgesic: Hydrocodone -acetaminophen  (Norco/Vicodine) 5-325 mg three times daily as needed for pain for up to 30 days. Monitoring: [place] PMP: PDMP reviewed during this encounter.       Pharmacotherapy: No side-effects or adverse reactions reported. Compliance: No problems identified. Effectiveness: Clinically acceptable.  [name], [place]  07/30/2023 10:41 AM  Sign when Signing Visit Nursing Pain Medication Assessment:  Safety precautions to be maintained throughout the outpatient stay will include: orient to surroundings, keep bed in low position, maintain call bell within reach at all times, provide assistance with transfer out of bed and ambulation.  Medication Inspection Compliance: Pill count conducted under aseptic conditions, in front of the patient. Neither the pills nor the bottle was removed from the patient's sight at any time. Once count was completed pills were immediately returned to the patient in their original bottle.  Medication: Morphine  IR Pill/Patch Count: 20 of 90 pills/patches remain Pill/Patch Appearance: Markings consistent with prescribed medication Bottle Appearance: Standard pharmacy container. Clearly labeled. Filled Date: 06 / 26 / 2025 Last Medication intake:  Today    UDS:  Summary  Date Value  Ref Range Status  10/03/2022 Note  Final     Comment:    ==================================================================== Compliance Drug Analysis, Ur ==================================================================== Test                             Result       Flag       Units  Drug Present and Declared for Prescription Verification   Morphine                        >14286       EXPECTED   ng/mg creat   Normorphine                    519          EXPECTED   ng/mg creat    Potential sources of large amounts of morphine  in the absence of    codeine include administration of morphine  or use of heroin.     Normorphine is an expected metabolite of morphine .    Hydromorphone                  167          EXPECTED   ng/mg creat    Hydromorphone may be present as a metabolite of morphine ;    concentrations of hydromorphone rarely exceed 5% of the morphine     concentration when this is the source of hydromorphone.    Gabapentin                     PRESENT      EXPECTED   Bupropion                      PRESENT      EXPECTED   Hydroxybupropion               PRESENT      EXPECTED    Hydroxybupropion is an expected metabolite of bupropion.    Mirtazapine                    PRESENT      EXPECTED  Drug Present not Declared for Prescription Verification   Ephedrine/Pseudoephedrine      PRESENT      UNEXPECTED   Phenylpropanolamine            PRESENT      UNEXPECTED    Source of ephedrine/pseudoephedrine is most commonly pseudoephedrine    in over-the-counter or prescription cold and allergy medications.    Phenylpropanolamine is an expected metabolite of    ephedrine/pseudoephedrine.  Drug Absent but Declared for Prescription Verification   Tizanidine                     Not Detected UNEXPECTED    Tizanidine, as indicated in the declared medication list, is not    always detected even when used as directed.    Salicylate                     Not Detected UNEXPECTED    Aspirin, as indicated in the declared medication list, is  not always    detected even when used as directed.  ==================================================================== Test                      Result    Flag   Units  Ref Range   Creatinine              70               mg/dL      >=79 ==================================================================== Declared Medications:  The flagging and interpretation on this report are based on the  following declared medications.  Unexpected results may arise from  inaccuracies in the declared medications.   **Note: The testing scope of this panel includes these medications:   Bupropion (Wellbutrin)  Gabapentin (Neurontin)  Mirtazapine (Remeron)  Morphine  (MSIR)   **Note: The testing scope of this panel does not include small to  moderate amounts of these reported medications:   Aspirin  Tizanidine (Zanaflex)   **Note: The testing scope of this panel does not include the  following reported medications:   Albuterol (Ventolin HFA)  Cholecalciferol  Clotrimazole (Mycelex)  Dapagliflozin (Farxiga)  Enalapril (Vasotec)  Furosemide (Lasix)  Insulin (Lantus)  Latanoprost (Xalatan)  Montelukast (Singulair)  Naloxone  (Narcan )  Oxybutynin (Ditropan)  Timolol (Timoptic)  Vitamin B12 ==================================================================== For clinical consultation, please call [phone]. ====================================================================     No results found for: CBDTHCR No results found for: D8THCCBX No results found for: D9THCCBX  ROS  Constitutional: Denies any fever or chills Gastrointestinal: No reported hemesis, hematochezia, vomiting, or acute GI distress Musculoskeletal: Back pain (radiates down to right leg and right knee). Neurological: No reported episodes of acute onset apraxia, aphasia, dysarthria, agnosia, amnesia, paralysis, loss of coordination, or loss of consciousness  Medication Review  BLOOD GLUCOSE TEST  STRIPS, Cholecalciferol, Fifty50 Glucose Meter 2.0, FreeStyle Libre 3 Sensor, HYDROcodone -acetaminophen , Insulin Aspart FlexPen, Insulin Syringe-Needle U-100, accu-chek multiclix, albuterol, aspirin, buPROPion, clotrimazole, cyanocobalamin, dapagliflozin propanediol, enalapril, furosemide, gabapentin, glucose blood, insulin glargine, latanoprost, mirtazapine, montelukast, morphine , mupirocin ointment, naloxone , oxybutynin, tiZANidine, and timolol  History Review  Allergy: [name] is allergic to amoxicillin, amoxicillin-pot clavulanate, copaxone  [glatiramer acetate], glatiramer, pseudoephedrine, rofecoxib, sulfa antibiotics, and meloxicam. Drug: [name]  reports no history of drug use. Alcohol:  reports no history of alcohol use. Tobacco:  reports that she has quit smoking. She has never used smokeless tobacco. Social: [name]  reports that she has quit smoking. She has never used smokeless tobacco. She reports that she does not drink alcohol and does not use drugs. Medical:  has a past medical history of Allergy, Arthritis, Asthma, Chronic kidney disease, Depression, Diabetes mellitus without complication (HCC), Hypertension, Multiple sclerosis (HCC), Neuromuscular disorder (HCC), and Sleep apnea. Surgical: [name]  has a past surgical history that includes Cesarean section (1991 and 1996). Family: family history includes Dementia in her mother; Depression in her mother; Diabetes in her mother; Hyperlipidemia in her mother; Stroke in her father.  Laboratory Chemistry Profile   Renal Lab Results  Component Value Date   BUN 18 02/06/2018   CREATININE 1.22 (H) 02/06/2018   BCR 15 02/06/2018   GFRAA 56 (L) 02/06/2018   GFRNONAA 49 (L) 02/06/2018    Hepatic Lab Results  Component Value Date   AST 25 02/06/2018   ALT 19 02/15/2015   ALBUMIN 4.6 02/06/2018   ALKPHOS 97 02/06/2018    Electrolytes Lab Results  Component Value Date   NA 136 02/06/2018   K 4.9 02/06/2018    CL 95 (L) 02/06/2018   CALCIUM 9.8 02/06/2018   MG 1.8 02/06/2018    Bone Lab Results  Component Value Date   25OHVITD1 55 02/06/2018   25OHVITD2 2.9 02/06/2018   25OHVITD3 52 02/06/2018  Inflammation (CRP: Acute Phase) (ESR: Chronic Phase) Lab Results  Component Value Date   CRP 17 (H) 02/06/2018   ESRSEDRATE 28 02/06/2018         Note: Above Lab results reviewed.  Recent Imaging Review  US  RENAL CLINICAL DATA:  Chronic renal disease  EXAM: RENAL / URINARY TRACT ULTRASOUND COMPLETE  COMPARISON:  None Available.  FINDINGS: Right Kidney:  Renal measurements: 10.3 x 4.9 x 5.4 cm = volume: 143 mL. Diffuse increased echogenicity.  Left Kidney:  Renal measurements: 9.9 x 4.6 x 5.2 cm = volume: 125 mL. Diffuse increased echogenicity.  Bladder:  Appears normal for degree of bladder distention.  Other:  None.  IMPRESSION: Diffuse increased echogenicity in both kidneys is consistent with medical renal disease. No other abnormalities.  Electronically Signed   By: Alm Pouch III M.D.   On: 11/25/2021 14:49 Note: Reviewed        Physical Exam  Vitals: BP (!) 115/53 (BP Location: Left Arm, Patient Position: Sitting)   Pulse 83   Ht 5' 1 (1.549 m)   Wt 208 lb (94.3 kg)   SpO2 98%   BMI 39.30 kg/m  BMI: Estimated body mass index is 39.3 kg/m as calculated from the following:   Height as of this encounter: 5' 1 (1.549 m).   Weight as of this encounter: 208 lb (94.3 kg). Ideal: Ideal body weight: 47.8 kg (105 lb 6.1 oz) Adjusted ideal body weight: 66.4 kg (146 lb 6.8 oz) General appearance: Well nourished, well developed, and well hydrated. In no apparent acute distress Mental status: Alert, oriented x 3 (person, place, & time)       Respiratory: No evidence of acute respiratory distress Eyes: PERLA   Assessment   Diagnosis Status  1. Chronic low back pain (Bilateral) w/ sciatica (Right)   2. Chronic lower extremity pain (Right)   3. Failed  back surgical syndrome   4. Generalized osteoarthritis of multiple sites   5. Chronic hip pain (Left)   6. Chronic sacroiliac joint pain (bilateral)   7. Encounter for medication management   8. Chronic pain syndrome   9. Pharmacologic therapy    Controlled Controlled Controlled   Updated Problems: No problems updated.  Plan of Care  Problem-specific:  Assessment and Plan  Started on Hydrocodone -acetaminophen  5-325 mg tablet three times daily as needed for pain for up to 30 days.  Prescribing drug monitoring (PDMP) reviewed; findings consistent with the use of prescribed medication and no evidence of narcotic misuse or abuse.  Urine drug screening (UDS) up-to-date.  Schedule follow-up in 30 days for medication evaluation.   [name] has a current medication list which includes the following long-term medication(s): albuterol, bupropion, furosemide, gabapentin, insulin aspart flexpen, insulin glargine, mirtazapine, montelukast, and morphine .  Pharmacotherapy (Medications Ordered): Meds ordered this encounter  Medications   HYDROcodone -acetaminophen  (NORCO/VICODIN) 5-325 MG tablet    Sig: Take 1 tablet by mouth 3 (three) times daily as needed for severe pain (pain score 7-10). Must last 30 days    Dispense:  90 tablet    Refill:  0    Chronic Pain: STOP Act (Not applicable) Fill 1 day early if closed on refill date. Avoid benzodiazepines within 8 hours of opioids   Orders:  No orders of the defined types were placed in this encounter.       Return in about 1 month (around 08/30/2023) for (F2F), (MM), Emmy Blanch NP.    Recent Visits Date Type Provider Dept  07/02/23 Office Visit Lemonte Al K, NP [facility]  Showing recent visits within past 90 days and meeting all other requirements Today's Visits Date Type Provider Dept  07/30/23 Office Visit Sahory Nordling K, NP [facility]  Showing today's visits and meeting all other  requirements Future Appointments Date Type Provider Dept  08/27/23 Appointment Joaquim Tolen K, NP [facility]  Showing future appointments within next 90 days and meeting all other requirements  I discussed the assessment and treatment plan with the patient. The patient was provided an opportunity to ask questions and all were answered. The patient agreed with the plan and demonstrated an understanding of the instructions.  Patient advised to call back or seek an in-person evaluation if the symptoms or condition worsens.  Duration of encounter: 30 minutes.  Total time on encounter, as per AMA guidelines included both the face-to-face and non-face-to-face time personally spent by the physician and/or other qualified health care professional(s) on the day of the encounter (includes time in activities that require the physician or other qualified health care professional and does not include time in activities normally performed by clinical staff). Physician's time may include the following activities when performed: Preparing to see the patient (e.g., pre-charting review of records, searching for previously ordered imaging, lab work, and nerve conduction tests) Review of prior analgesic pharmacotherapies. Reviewing PMP Interpreting ordered tests (e.g., lab work, imaging, nerve conduction tests) Performing post-procedure evaluations, including interpretation of diagnostic procedures Obtaining and/or reviewing separately obtained history Performing a medically appropriate examination and/or evaluation Counseling and educating the patient/family/caregiver Ordering medications, tests, or procedures Referring and communicating with other health care professionals (when not separately reported) Documenting clinical information in the electronic or other health record Independently interpreting results (not separately reported) and communicating results to the patient/ family/caregiver Care  coordination (not separately reported)  Note by: Lesslie Mckeehan K Aarin Sparkman, NP (TTS and AI technology used. I apologize for any typographical errors that were not detected and corrected.) Date: 07/30/2023; Time: 11:17 AM

## 2023-07-31 ENCOUNTER — Telehealth: Payer: Self-pay | Admitting: Nurse Practitioner

## 2023-07-31 NOTE — Telephone Encounter (Signed)
 Pharmacy called stated that they don't delivery on Saturday. Pharmacy will like to see if Tiffany Herring will allow a early fill date for Friday. Please give pharmacy a call. TY

## 2023-07-31 NOTE — Telephone Encounter (Signed)
 Spoke with pharmacist, ok to deliver one day early.

## 2023-08-27 ENCOUNTER — Ambulatory Visit: Attending: Nurse Practitioner | Admitting: Nurse Practitioner

## 2023-08-27 ENCOUNTER — Encounter: Payer: Self-pay | Admitting: Nurse Practitioner

## 2023-08-27 DIAGNOSIS — G8929 Other chronic pain: Secondary | ICD-10-CM | POA: Insufficient documentation

## 2023-08-27 DIAGNOSIS — Z79891 Long term (current) use of opiate analgesic: Secondary | ICD-10-CM | POA: Diagnosis present

## 2023-08-27 DIAGNOSIS — M5441 Lumbago with sciatica, right side: Secondary | ICD-10-CM | POA: Diagnosis not present

## 2023-08-27 DIAGNOSIS — M79604 Pain in right leg: Secondary | ICD-10-CM | POA: Diagnosis not present

## 2023-08-27 DIAGNOSIS — G35 Multiple sclerosis: Secondary | ICD-10-CM | POA: Diagnosis present

## 2023-08-27 DIAGNOSIS — M961 Postlaminectomy syndrome, not elsewhere classified: Secondary | ICD-10-CM | POA: Insufficient documentation

## 2023-08-27 DIAGNOSIS — M25552 Pain in left hip: Secondary | ICD-10-CM | POA: Insufficient documentation

## 2023-08-27 DIAGNOSIS — Z79899 Other long term (current) drug therapy: Secondary | ICD-10-CM | POA: Insufficient documentation

## 2023-08-27 DIAGNOSIS — M47816 Spondylosis without myelopathy or radiculopathy, lumbar region: Secondary | ICD-10-CM | POA: Diagnosis present

## 2023-08-27 DIAGNOSIS — M533 Sacrococcygeal disorders, not elsewhere classified: Secondary | ICD-10-CM | POA: Insufficient documentation

## 2023-08-27 DIAGNOSIS — G894 Chronic pain syndrome: Secondary | ICD-10-CM | POA: Diagnosis present

## 2023-08-27 DIAGNOSIS — M159 Polyosteoarthritis, unspecified: Secondary | ICD-10-CM | POA: Diagnosis not present

## 2023-08-27 MED ORDER — MORPHINE SULFATE 15 MG PO TABS: 15.0000 mg | ORAL_TABLET | Freq: Three times a day (TID) | ORAL | 0 refills | Status: DC

## 2023-08-27 NOTE — Progress Notes (Signed)
 PROVIDER NOTE: Interpretation of information contained herein should be left to medically-trained personnel. Specific patient instructions are provided elsewhere under Patient Instructions section of medical record. This document was created in part using AI and STT-dictation technology, any transcriptional errors that may result from this process are unintentional.  Patient: Tiffany Herring  Service: E/M   PCP: Rojelio Loader, MD  DOB: [birth year]  DOS: 08/27/2023  Provider: Emmy MARLA Blanch, NP  MRN: [id number]  Delivery: Face-to-face  Specialty: Interventional Pain Management  Type: Established Patient  Setting: Ambulatory outpatient facility  Specialty designation: 09  Referring Prov.: Rojelio Loader, MD  Location: Outpatient office facility       History of present illness (HPI) [name], a 63 y.o. year old female, is here today because of her No primary diagnosis found.. [name] primary complain today is Leg Pain (Bilateral legs, hips and right shoulder)  Pertinent problems: 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   Pain Assessment: Severity of Chronic pain is reported as a 8 /10. Location: Leg (bilateral legs, hips and right shoulder) Right, Left/pt states she has MS: radiates down both legs to knees. Onset: More than a month ago. Quality: Sharp, Stabbing. Timing: Constant. Modifying factor(s): denies. Vitals:  height is 5' 1 (1.549 m) and weight is 208 lb (94.3 kg). Her temperature is 97.3 F (36.3 C) (abnormal). Her blood pressure is 138/76 and her pulse is 81. Her oxygen  saturation is 99%.  BMI: Estimated body mass index is 39.3 kg/m as calculated from the following:   Height as of this encounter: 5' 1 (1.549 m).   Weight as of  this encounter: 208 lb (94.3 kg).  Last encounter: 07/30/2023. Last procedure: Visit date not found.  Reason for encounter: medication management. The patient indicates doing well with current medication regimen. No side effects or adverse reaction reported to medication.  The patient was previously on morphine ; however, due to a pharmacy shortage, she was prescribed Norco last month.  She has since confirmed with her current pharmacy that morphine  is now available, so we will transition her back from Norco to morphine .  Pharmacotherapy Assessment   Analgesic: Morphine  (MS IR) 15 mg tablet every 8 hours as needed for pain. MME for Morphine = 45  Hydrocodone -acetaminophen  (Norco/Vicodine) 5-325 mg three times daily as needed for pain for up to 30 days.  MME= 15 (Discontinue) on 08/27/2023   Monitoring: [place] PMP: PDMP reviewed during this encounter.       Pharmacotherapy: No side-effects or adverse reactions reported. Compliance: No problems identified. Effectiveness: Clinically acceptable.  Margrette Nathanel PARAS, RN  08/27/2023 10:34 AM  Sign when Signing Visit Safety precautions to be maintained throughout the outpatient stay will include: orient to surroundings, keep bed in low position, maintain call bell within reach at all times, provide assistance with transfer out of bed and ambulation.   Nursing Pain Medication Assessment:  Safety precautions to be maintained throughout the outpatient stay will include: orient to surroundings, keep bed in low position, maintain call bell within reach at all times, provide assistance with transfer out of bed and ambulation.  Medication Inspection Compliance: Pill count conducted under aseptic conditions, in front of the patient. Neither the pills nor the bottle was removed from the patient's sight at any time. Once count was completed  pills were immediately returned to the patient in their original bottle.  Medication: Hydrocodone /APAP Pill/Patch Count: 23 of  90 pills/patches remain Pill/Patch Appearance: Markings consistent with prescribed medication Bottle Appearance: Standard pharmacy container. Clearly labeled. Filled Date: 7 / 25 / 2025 Last Medication intake:  Today    UDS:  Summary  Date Value Ref Range Status  10/03/2022 Note  Final    Comment:    ==================================================================== Compliance Drug Analysis, Ur ==================================================================== Test                             Result       Flag       Units  Drug Present and Declared for Prescription Verification   Morphine                        >14286       EXPECTED   ng/mg creat   Normorphine                    519          EXPECTED   ng/mg creat    Potential sources of large amounts of morphine  in the absence of    codeine include administration of morphine  or use of heroin.     Normorphine is an expected metabolite of morphine .    Hydromorphone                  167          EXPECTED   ng/mg creat    Hydromorphone may be present as a metabolite of morphine ;    concentrations of hydromorphone rarely exceed 5% of the morphine     concentration when this is the source of hydromorphone.    Gabapentin                     PRESENT      EXPECTED   Bupropion                      PRESENT      EXPECTED   Hydroxybupropion               PRESENT      EXPECTED    Hydroxybupropion is an expected metabolite of bupropion.    Mirtazapine                    PRESENT      EXPECTED  Drug Present not Declared for Prescription Verification   Ephedrine/Pseudoephedrine      PRESENT      UNEXPECTED   Phenylpropanolamine            PRESENT      UNEXPECTED    Source of ephedrine/pseudoephedrine is most commonly pseudoephedrine    in over-the-counter or prescription cold and allergy medications.    Phenylpropanolamine is an expected metabolite of    ephedrine/pseudoephedrine.  Drug Absent but Declared for Prescription  Verification   Tizanidine                     Not Detected UNEXPECTED    Tizanidine, as indicated in the declared medication list, is not    always detected even when used as directed.    Salicylate                     Not Detected  UNEXPECTED    Aspirin, as indicated in the declared medication list, is not always    detected even when used as directed.  ==================================================================== Test                      Result    Flag   Units      Ref Range   Creatinine              70               mg/dL      >=79 ==================================================================== Declared Medications:  The flagging and interpretation on this report are based on the  following declared medications.  Unexpected results may arise from  inaccuracies in the declared medications.   **Note: The testing scope of this panel includes these medications:   Bupropion (Wellbutrin)  Gabapentin (Neurontin)  Mirtazapine (Remeron)  Morphine  (MSIR)   **Note: The testing scope of this panel does not include small to  moderate amounts of these reported medications:   Aspirin  Tizanidine (Zanaflex)   **Note: The testing scope of this panel does not include the  following reported medications:   Albuterol (Ventolin HFA)  Cholecalciferol  Clotrimazole (Mycelex)  Dapagliflozin (Farxiga)  Enalapril (Vasotec)  Furosemide (Lasix)  Insulin (Lantus)  Latanoprost (Xalatan)  Montelukast (Singulair)  Naloxone  (Narcan )  Oxybutynin (Ditropan)  Timolol (Timoptic)  Vitamin B12 ==================================================================== For clinical consultation, please call [phone]. ====================================================================     No results found for: CBDTHCR No results found for: D8THCCBX No results found for: D9THCCBX  ROS  Constitutional: Denies any fever or chills Gastrointestinal: No reported hemesis,  hematochezia, vomiting, or acute GI distress Musculoskeletal: Denies any acute onset joint swelling, redness, loss of ROM, or weakness Neurological: No reported episodes of acute onset apraxia, aphasia, dysarthria, agnosia, amnesia, paralysis, loss of coordination, or loss of consciousness  Medication Review  BLOOD GLUCOSE TEST STRIPS, Cholecalciferol, Fifty50 Glucose Meter 2.0, FreeStyle Libre 3 Sensor, Insulin Syringe-Needle U-100, accu-chek multiclix, albuterol, aspirin, buPROPion, clotrimazole, cyanocobalamin, enalapril, furosemide, gabapentin, glucose blood, latanoprost, mirtazapine, montelukast, morphine , mupirocin ointment, naloxone , oxybutynin, tiZANidine, and timolol  History Review  Allergy: [name] is allergic to amoxicillin, amoxicillin-pot clavulanate, copaxone  [glatiramer acetate], glatiramer, pseudoephedrine, rofecoxib, sulfa antibiotics, and meloxicam. Drug: [name]  reports no history of drug use. Alcohol:  reports no history of alcohol use. Tobacco:  reports that she has quit smoking. She has never used smokeless tobacco. Social: [name]  reports that she has quit smoking. She has never used smokeless tobacco. She reports that she does not drink alcohol and does not use drugs. Medical:  has a past medical history of Allergy, Arthritis, Asthma, Chronic kidney disease, Depression, Diabetes mellitus without complication (HCC), Hypertension, Multiple sclerosis (HCC), Neuromuscular disorder (HCC), and Sleep apnea. Surgical: [name]  has a past surgical history that includes Cesarean section (1991 and 1996). Family: family history includes Dementia in her mother; Depression in her mother; Diabetes in her mother; Hyperlipidemia in her mother; Stroke in her father.  Laboratory Chemistry Profile   Renal Lab Results  Component Value Date   BUN 18 02/06/2018   CREATININE 1.22 (H) 02/06/2018   BCR 15 02/06/2018   GFRAA 56 (L) 02/06/2018   GFRNONAA 49 (L)  02/06/2018    Hepatic Lab Results  Component Value Date   AST 25 02/06/2018   ALT 19 02/15/2015   ALBUMIN 4.6 02/06/2018   ALKPHOS 97 02/06/2018    Electrolytes Lab Results  Component  Value Date   NA 136 02/06/2018   K 4.9 02/06/2018   CL 95 (L) 02/06/2018   CALCIUM 9.8 02/06/2018   MG 1.8 02/06/2018    Bone Lab Results  Component Value Date   25OHVITD1 55 02/06/2018   25OHVITD2 2.9 02/06/2018   25OHVITD3 52 02/06/2018    Inflammation (CRP: Acute Phase) (ESR: Chronic Phase) Lab Results  Component Value Date   CRP 17 (H) 02/06/2018   ESRSEDRATE 28 02/06/2018         Note: Above Lab results reviewed.  Recent Imaging Review  US  RENAL CLINICAL DATA:  Chronic renal disease  EXAM: RENAL / URINARY TRACT ULTRASOUND COMPLETE  COMPARISON:  None Available.  FINDINGS: Right Kidney:  Renal measurements: 10.3 x 4.9 x 5.4 cm = volume: 143 mL. Diffuse increased echogenicity.  Left Kidney:  Renal measurements: 9.9 x 4.6 x 5.2 cm = volume: 125 mL. Diffuse increased echogenicity.  Bladder:  Appears normal for degree of bladder distention.  Other:  None.  IMPRESSION: Diffuse increased echogenicity in both kidneys is consistent with medical renal disease. No other abnormalities.  Electronically Signed   By: Alm Pouch III M.D.   On: 11/25/2021 14:49 Note: Reviewed        Physical Exam  Vitals: BP 138/76   Pulse 81   Temp (!) 97.3 F (36.3 C)   Ht 5' 1 (1.549 m)   Wt 208 lb (94.3 kg)   SpO2 99%   BMI 39.30 kg/m  BMI: Estimated body mass index is 39.3 kg/m as calculated from the following:   Height as of this encounter: 5' 1 (1.549 m).   Weight as of this encounter: 208 lb (94.3 kg). Ideal: Ideal body weight: 47.8 kg (105 lb 6.1 oz) Adjusted ideal body weight: 66.4 kg (146 lb 6.8 oz) General appearance: Well nourished, well developed, and well hydrated. In no apparent acute distress Mental status: Alert, oriented x 3 (person, place, & time)        Respiratory: No evidence of acute respiratory distress Eyes: PERLA   Assessment   Diagnosis Status  1. Chronic low back pain (Bilateral) w/ sciatica (Right)   2. Chronic lower extremity pain (Right)   3. Failed back surgical syndrome   4. Generalized osteoarthritis of multiple sites   5. Multiple sclerosis (HCC)   6. Chronic sacroiliac joint pain (bilateral)   7. Lumbar facet syndrome (Bilateral)   8. Chronic hip pain (Left)   9. Chronic pain syndrome   10. Pharmacologic therapy   11. Chronic use of opiate for therapeutic purpose   12. Encounter for medication management   13. Encounter for chronic pain management    Controlled Controlled Controlled   Updated Problems: No problems updated.  Plan of Care  Problem-specific:  Assessment and Plan  Discontinue - Hydrocodone -acetaminophen  (Norco/Vicodine) 5-325 mg three times daily (08/27/2023)  Started on Morphine  (MS IR) 15 mg tablet every 8 hours as needed for pain. (08/27/2023)  We will continue on current medication regimen.  Prescribing drug monitoring (PDMP) reviewed; findings consistent with the prescribed medication and no evidence of narcotic misuse or abuse.  Routine UDS ordered today. Schedule follow-up in 90 days for medication management.  No other new issues or problems reported at this visit.   [name] has a current medication list which includes the following long-term medication(s): albuterol, bupropion, furosemide, gabapentin, mirtazapine, montelukast, [START ON 09/02/2023] morphine , [START ON 10/02/2023] morphine , and [START ON 11/01/2023] morphine .  Pharmacotherapy (Medications Ordered): Meds ordered  this encounter  Medications   morphine  (MSIR) 15 MG tablet    Sig: Take 1 tablet (15 mg total) by mouth every 8 (eight) hours. Must last 30 days.    Dispense:  90 tablet    Refill:  0    DO NOT: delete (not duplicate); no partial-fill (will deny script to complete), no refill request (F/U  required). DISPENSE: 1 day early if closed on fill date. WARN: No CNS-depressants within 8 hrs of med.   morphine  (MSIR) 15 MG tablet    Sig: Take 1 tablet (15 mg total) by mouth every 8 (eight) hours. Must last 30 days.    Dispense:  90 tablet    Refill:  0    DO NOT: delete (not duplicate); no partial-fill (will deny script to complete), no refill request (F/U required). DISPENSE: 1 day early if closed on fill date. WARN: No CNS-depressants within 8 hrs of med.   morphine  (MSIR) 15 MG tablet    Sig: Take 1 tablet (15 mg total) by mouth every 8 (eight) hours. Must last 30 days.    Dispense:  90 tablet    Refill:  0    DO NOT: delete (not duplicate); no partial-fill (will deny script to complete), no refill request (F/U required). DISPENSE: 1 day early if closed on fill date. WARN: No CNS-depressants within 8 hrs of med.   Orders:  Orders Placed This Encounter  Procedures   ToxASSURE Select 13 (MW), Urine    Volume: 30 ml(s). Minimum 3 ml of urine is needed. Document temperature of fresh sample. Indications: Long term (current) use of opiate analgesic (S20.108)    Release to patient:   Immediate        Return in about 3 months (around 11/27/2023) for (F2F), (MM), Emmy Blanch NP.    Recent Visits Date Type Provider Dept  07/30/23 Office Visit Jawanda Passey K, NP [facility]  07/02/23 Office Visit Christion Leonhard K, NP [facility]  Showing recent visits within past 90 days and meeting all other requirements Today's Visits Date Type Provider Dept  08/27/23 Office Visit Wen Munford K, NP [facility]  Showing today's visits and meeting all other requirements Future Appointments Date Type Provider Dept  11/19/23 Appointment Shauniece Kwan K, NP [facility]  Showing future appointments within next 90 days and meeting all other requirements  I discussed the assessment and treatment plan with the patient. The patient was provided an opportunity to ask  questions and all were answered. The patient agreed with the plan and demonstrated an understanding of the instructions.  Patient advised to call back or seek an in-person evaluation if the symptoms or condition worsens.  Duration of encounter: 30 minutes.  Total time on encounter, as per AMA guidelines included both the face-to-face and non-face-to-face time personally spent by the physician and/or other qualified health care professional(s) on the day of the encounter (includes time in activities that require the physician or other qualified health care professional and does not include time in activities normally performed by clinical staff). Physician's time may include the following activities when performed: Preparing to see the patient (e.g., pre-charting review of records, searching for previously ordered imaging, lab work, and nerve conduction tests) Review of prior analgesic pharmacotherapies. Reviewing PMP Interpreting ordered tests (e.g., lab work, imaging, nerve conduction tests) Performing post-procedure evaluations, including interpretation of diagnostic procedures Obtaining and/or reviewing separately obtained history Performing a medically appropriate examination and/or evaluation Counseling and educating the  patient/family/caregiver Ordering medications, tests, or procedures Referring and communicating with other health care professionals (when not separately reported) Documenting clinical information in the electronic or other health record Independently interpreting results (not separately reported) and communicating results to the patient/ family/caregiver Care coordination (not separately reported)  Note by: Addalie Calles K Sophie Quiles, NP (TTS and AI technology used. I apologize for any typographical errors that were not detected and corrected.) Date: 08/27/2023; Time: 11:36 AM

## 2023-08-27 NOTE — Progress Notes (Signed)
 Safety precautions to be maintained throughout the outpatient stay will include: orient to surroundings, keep bed in low position, maintain call bell within reach at all times, provide assistance with transfer out of bed and ambulation.   Nursing Pain Medication Assessment:  Safety precautions to be maintained throughout the outpatient stay will include: orient to surroundings, keep bed in low position, maintain call bell within reach at all times, provide assistance with transfer out of bed and ambulation.  Medication Inspection Compliance: Pill count conducted under aseptic conditions, in front of the patient. Neither the pills nor the bottle was removed from the patient's sight at any time. Once count was completed pills were immediately returned to the patient in their original bottle.  Medication: Hydrocodone /APAP Pill/Patch Count: 23 of 90 pills/patches remain Pill/Patch Appearance: Markings consistent with prescribed medication Bottle Appearance: Standard pharmacy container. Clearly labeled. Filled Date: 7 / 25 / 2025 Last Medication intake:  Today

## 2023-08-29 LAB — TOXASSURE SELECT 13 (MW), URINE

## 2023-11-19 ENCOUNTER — Ambulatory Visit: Attending: Nurse Practitioner | Admitting: Nurse Practitioner

## 2023-11-19 ENCOUNTER — Encounter: Payer: Self-pay | Admitting: Nurse Practitioner

## 2023-11-19 DIAGNOSIS — G894 Chronic pain syndrome: Secondary | ICD-10-CM | POA: Diagnosis present

## 2023-11-19 DIAGNOSIS — M79604 Pain in right leg: Secondary | ICD-10-CM | POA: Insufficient documentation

## 2023-11-19 DIAGNOSIS — Z79891 Long term (current) use of opiate analgesic: Secondary | ICD-10-CM | POA: Diagnosis present

## 2023-11-19 DIAGNOSIS — M5441 Lumbago with sciatica, right side: Secondary | ICD-10-CM | POA: Diagnosis present

## 2023-11-19 DIAGNOSIS — M47816 Spondylosis without myelopathy or radiculopathy, lumbar region: Secondary | ICD-10-CM | POA: Insufficient documentation

## 2023-11-19 DIAGNOSIS — G35D Multiple sclerosis, unspecified: Secondary | ICD-10-CM | POA: Insufficient documentation

## 2023-11-19 DIAGNOSIS — Z79899 Other long term (current) drug therapy: Secondary | ICD-10-CM | POA: Diagnosis present

## 2023-11-19 DIAGNOSIS — M533 Sacrococcygeal disorders, not elsewhere classified: Secondary | ICD-10-CM | POA: Insufficient documentation

## 2023-11-19 DIAGNOSIS — G8929 Other chronic pain: Secondary | ICD-10-CM | POA: Insufficient documentation

## 2023-11-19 DIAGNOSIS — M961 Postlaminectomy syndrome, not elsewhere classified: Secondary | ICD-10-CM | POA: Diagnosis present

## 2023-11-19 DIAGNOSIS — M159 Polyosteoarthritis, unspecified: Secondary | ICD-10-CM | POA: Diagnosis present

## 2023-11-19 DIAGNOSIS — M25552 Pain in left hip: Secondary | ICD-10-CM | POA: Insufficient documentation

## 2023-11-19 MED ORDER — MORPHINE SULFATE 15 MG PO TABS: 15.0000 mg | ORAL_TABLET | Freq: Three times a day (TID) | ORAL | 0 refills | Status: DC

## 2023-11-19 NOTE — Progress Notes (Signed)
 PROVIDER NOTE: Interpretation of information contained herein should be left to medically-trained personnel. Specific patient instructions are provided elsewhere under Patient Instructions section of medical record. This document was created in part using AI and STT-dictation technology, any transcriptional errors that may result from this process are unintentional.  Patient: Tiffany Herring  Service: E/M   PCP: Tiffany Loader, MD  DOB: Oct 07, 1960  DOS: 11/19/2023  Provider: Emmy MARLA Blanch, NP  MRN: [id number]  Delivery: Face-to-face  Specialty: Interventional Pain Management  Type: Established Patient  Setting: Ambulatory outpatient facility  Specialty designation: 09  Referring Prov.: Tiffany Loader, MD  Location: Outpatient office facility       History of present illness (HPI) [name], a 63 y.o. year old female, is here today because of her Bilateral hip pain. [name] primary complain today is Hip Pain  Pertinent problems: 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   Pain Assessment: Severity of Chronic pain is reported as a 7 /10. Location: Hip Right, Left/Radiates down to bilateral knees. Onset: More than a month ago. Quality: Sharp. Timing: Constant. Modifying factor(s): Medication. Vitals:  height is 5' (1.524 m) and weight is 212 lb (96.2 kg). Her temporal temperature is 97.1 F (36.2 C) (abnormal). Her blood pressure is 132/87 and her pulse is 71. Her respiration is 18 and oxygen  saturation is 100%.  BMI: Estimated body mass index is 41.4 kg/m as calculated from the following:   Height as of this encounter: 5' (1.524 m).   Weight as of this encounter: 212 lb (96.2 kg).  Last encounter: 08/27/2023. Last procedure: Visit date  not found.  Reason for encounter: medication management. The patient indicates doing well with current medication regimen. No side effects or adverse reaction reported to medication.  The patient reports ongoing bilateral hip pain that significantly limits her daily activities.  She is maintained on morphine  15 mg every 8 hours; however, the pain has worsened to the point that she is unable to move her legs, and the current medication regimen no longer provides adequate pain relief.  Discussed the use of AI scribe software for clinical note transcription with the patient, who gave verbal consent to proceed.  History of Present Illness   Tiffany Herring is a 63 year old female who presents with bilateral hip pain for pain management.  She has been experiencing bilateral hip pain that has progressively worsened over the past six months. The pain has become so severe that she is unable to move her legs effectively, stating, 'I can't move my legs. My legs don't move.' She describes having to exert significant effort just to shuffle her feet, whereas previously she was able to lift her legs.  She has a history of taking morphine  immediate release 15 mg three times a day for pain management. She previously took 30 mg of extended-release morphine  three times a day, along with Vicodin, but was hesitant to continue at that dosage. She has not experienced any side effects from her current morphine  regimen.  She reports that MRIs of her hips have been performed and that she has been told she is not a candidate for surgery.     Pharmacotherapy Assessment   Analgesic: Morphine  (MS IR) 15 mg tablet every 8 hours as needed for pain. MME for Morphine =  45 Monitoring: Tiffany Herring: PDMP reviewed during this encounter.       Pharmacotherapy: No side-effects or adverse reactions reported. Compliance: No problems identified. Effectiveness: Clinically acceptable.  Tiffany Herring, Tiffany Herring  11/19/2023 10:26 AM  Sign when  Signing Visit Nursing Pain Medication Assessment:  Safety precautions to be maintained throughout the outpatient stay will include: orient to surroundings, keep bed in low position, maintain call bell within reach at all times, provide assistance with transfer out of bed and ambulation.  Medication Inspection Compliance: Pill count conducted under aseptic conditions, in front of the patient. Neither the pills nor the bottle was removed from the patient's sight at any time. Once count was completed pills were immediately returned to the patient in their original bottle.  Medication: Morphine  IR Pill/Patch Count: 41 of 90 pills/patches remain Pill/Patch Appearance: Markings consistent with prescribed medication Bottle Appearance: Standard pharmacy container. Clearly labeled. Filled Date: 63 / 23 / 2025 Last Medication intake:  Today    UDS:  Summary  Date Value Ref Range Status  08/27/2023 FINAL  Final    Comment:    ==================================================================== [employer] 13 (MW) ==================================================================== Test                             Result       Flag       Units  Drug Present and Declared for Prescription Verification   Morphine                        6560         EXPECTED   ng/mg creat   Normorphine                    153          EXPECTED   ng/mg creat    Potential sources of large amounts of morphine  in the absence of    codeine include administration of morphine  or use of heroin.     Normorphine is an expected metabolite of morphine .  Drug Present not Declared for Prescription Verification   Hydrocodone                     543          UNEXPECTED ng/mg creat   Norhydrocodone                 866          UNEXPECTED ng/mg creat    Sources of hydrocodone  include scheduled prescription medications.    Norhydrocodone is an expected metabolite of  hydrocodone .  ==================================================================== Test                      Result    Flag   Units      Ref Range   Creatinine              53               mg/dL      >=79 ==================================================================== Declared Medications:  The flagging and interpretation on this report are based on the  following declared medications.  Unexpected results may arise from  inaccuracies in the declared medications.   **Note: The testing scope of this panel includes these medications:   Morphine  (MSIR)   **Note: The testing scope of this panel does not include the  following reported medications:  Albuterol (Ventolin HFA)  Aspirin  Bupropion (Wellbutrin)  Enalapril (Vasotec)  Furosemide (Lasix)  Gabapentin (Neurontin)  Insulin  Latanoprost (Xalatan)  Mirtazapine (Remeron)  Montelukast (Singulair)  Mupirocin (Bactroban)  Naloxone  (Narcan )  Oxybutynin (Ditropan)  Timolol (Timoptic)  Tizanidine (Zanaflex)  Vitamin B12 ==================================================================== For clinical consultation, please call [phone]. ====================================================================     No results found for: CBDTHCR No results found for: D8THCCBX No results found for: D9THCCBX  ROS  Constitutional: Denies any fever or chills Gastrointestinal: No reported hemesis, hematochezia, vomiting, or acute GI distress Musculoskeletal: Bilateral hip pain Neurological: No reported episodes of acute onset apraxia, aphasia, dysarthria, agnosia, amnesia, paralysis, loss of coordination, or loss of consciousness  Medication Review  BLOOD GLUCOSE TEST STRIPS, Cholecalciferol, Fifty50 Glucose Meter 2.0, FreeStyle Libre 3 Sensor, Insulin Syringe-Needle U-100, accu-chek multiclix, albuterol, aspirin, buPROPion, clotrimazole, cyanocobalamin, enalapril, furosemide, gabapentin, glucose blood, latanoprost,  mirtazapine, montelukast, morphine , mupirocin ointment, oxybutynin, sertraline, tiZANidine, and timolol  History Review  Allergy: Ms. Witherspoon is allergic to amoxicillin, amoxicillin-pot clavulanate, copaxone  [glatiramer acetate], glatiramer, pseudoephedrine, rofecoxib, sulfa antibiotics, and meloxicam. Drug: Ms. Brouhard  reports no history of drug use. Alcohol:  reports no history of alcohol use. Tobacco:  reports that she has quit smoking. She has never used smokeless tobacco. Social: Ms. Amedee  reports that she has quit smoking. She has never used smokeless tobacco. She reports that she does not drink alcohol and does not use drugs. Medical:  has a past medical history of Allergy, Arthritis, Asthma, Chronic kidney disease, Depression, Diabetes mellitus without complication (HCC), Hypertension, Multiple sclerosis, Neuromuscular disorder (HCC), and Sleep apnea. Surgical: Ms. Ponce  has a past surgical history that includes Cesarean section (1991 and 1996). Family: family history includes Dementia in her mother; Depression in her mother; Diabetes in her mother; Hyperlipidemia in her mother; Stroke in her father.  Laboratory Chemistry Profile   Renal Lab Results  Component Value Date   BUN 18 02/06/2018   CREATININE 1.22 (H) 02/06/2018   BCR 15 02/06/2018   GFRAA 56 (L) 02/06/2018   GFRNONAA 49 (L) 02/06/2018    Hepatic Lab Results  Component Value Date   AST 25 02/06/2018   ALT 19 02/15/2015   ALBUMIN 4.6 02/06/2018   ALKPHOS 97 02/06/2018    Electrolytes Lab Results  Component Value Date   NA 136 02/06/2018   K 4.9 02/06/2018   CL 95 (L) 02/06/2018   CALCIUM 9.8 02/06/2018   MG 1.8 02/06/2018    Bone Lab Results  Component Value Date   25OHVITD1 55 02/06/2018   25OHVITD2 2.9 02/06/2018   25OHVITD3 52 02/06/2018    Inflammation (CRP: Acute Phase) (ESR: Chronic Phase) Lab Results  Component Value Date   CRP 17 (H) 02/06/2018   ESRSEDRATE 28 02/06/2018          Note: Above Lab results reviewed.  Recent Imaging Review   Narrative & Impression  PRIOR REPORT IMPORTED FROM AN EXTERNAL SYSTEM    CLINICAL DATA:  Pain with limited range of motion. No trauma history  submitted.    EXAM:  RIGHT HIP - COMPLETE 2+ VIEW    COMPARISON:  DG SI JOINTS 3+V dated 03/24/2013; DG HIP COMPLETE  2+V*L* dated 03/24/2013    FINDINGS:  Degraded exam secondary to patient body habitus. Moderate right hip  osteoarthritis. Joint space narrowing and osteophyte formation.  Degenerate changes also involve the right sacroiliac joint. No acute  fracture or dislocation.    IMPRESSION:  Osteoarthritis of the right hip and  right sacroiliac joint. No acute  osseous abnormality.      Electronically Signed    By: Rockey Kilts M.D.    On: 03/24/2013 17:25    Narrative & Impression  PRIOR REPORT IMPORTED FROM AN EXTERNAL SYSTEM    CLINICAL DATA:  Left hip pain and limited range of motion    EXAM:  LEFT HIP - COMPLETE 2+ VIEW    COMPARISON:  None.    FINDINGS:  Severe left hip degenerative change noted. No fracture or  dislocation. No displaced pelvic fracture. Sacroiliac joints are  unremarkable.    IMPRESSION:  Negative.      Electronically Signed    By: Truman Seip M.D.    On: 03/24/2013 17:33    Physical Exam  Vitals: BP 132/87 (BP Location: Right Arm, Patient Position: Sitting, Cuff Size: Normal)   Pulse 71   Temp (!) 97.1 F (36.2 C) (Temporal)   Resp 18   Ht 5' (1.524 m)   Wt 212 lb (96.2 kg)   SpO2 100%   BMI 41.40 kg/m  BMI: Estimated body mass index is 41.4 kg/m as calculated from the following:   Height as of this encounter: 5' (1.524 m).   Weight as of this encounter: 212 lb (96.2 kg). Ideal: Ideal body weight: 45.5 kg (100 lb 4.9 oz) Adjusted ideal body weight: 65.8 kg (144 lb 15.8 oz) General appearance: Well nourished, well developed, and well hydrated. In no apparent acute distress Mental status: Alert, oriented x 3  (person, place, & time)       Respiratory: No evidence of acute respiratory distress Eyes: PERLA  Musculoskeletal: Bilateral hip pain Assessment   Diagnosis Status  1. Chronic hip pain (Left)   2. Chronic pain syndrome   3. Pharmacologic therapy   4. Chronic low back pain (Bilateral) w/ sciatica (Right)   5. Chronic lower extremity pain (Right)   6. Failed back surgical syndrome   7. Generalized osteoarthritis of multiple sites   8. Multiple sclerosis   9. Chronic sacroiliac joint pain (bilateral)   10. Lumbar facet syndrome (Bilateral)   11. Chronic use of opiate for therapeutic purpose    Controlled Controlled Controlled   Updated Problems: No problems updated.  Plan of Care  Problem-specific:  Assessment and Plan    Bilateral hip pain: Chronic bilateral hip pain for six months, non-surgical. Pain management options limited.  The patient had bilateral hip x-rays on March 25, 2023, which demonstrated severe degenerative changes in the left hip.  The right hip x-ray revealed osteoarthritis of the right hip with right sacroiliac joint space narrowing.  She is not a candidate for surgical interventions.  Chronic pain syndrome: Managed with morphine  15 mg immediate release TID. No side effects reported. Previous increase to 30 mg extended release declined due to side effect concerns. - Continue morphine  15 mg immediate release TID. - Sent prescription to pharmacy for three months. Patient's pain is controlled with morphine  15 mg tablet, will continue on current medication regimen. Prescribing drug monitoring (PDMP) reviewed, findings consistent with the use of prescribed medication and no evidence of narcotic misuse or abuse.  Urine drug screening (UDS) up to date and consistent with the use of prescribed medication.  No side effects or adverse reaction reported to medication.  Schedule follow-up in 90 days for medication management.  Pharmacotherapy: Meds ordered this encounter   Medications   morphine  (MSIR) 15 MG tablet    Sig: Take 1 tablet (15 mg total) by  mouth every 8 (eight) hours. Must last 30 days.    Dispense:  90 tablet    Refill:  0    DO NOT: delete (not duplicate); no partial-fill (will deny script to complete), no refill request (F/U required). DISPENSE: 1 day early if closed on fill date. WARN: No CNS-depressants within 8 hrs of med.   morphine  (MSIR) 15 MG tablet    Sig: Take 1 tablet (15 mg total) by mouth every 8 (eight) hours. Must last 30 days.    Dispense:  90 tablet    Refill:  0    DO NOT: delete (not duplicate); no partial-fill (will deny script to complete), no refill request (F/U required). DISPENSE: 1 day early if closed on fill date. WARN: No CNS-depressants within 8 hrs of med.   morphine  (MSIR) 15 MG tablet    Sig: Take 1 tablet (15 mg total) by mouth every 8 (eight) hours. Must last 30 days.    Dispense:  90 tablet    Refill:  0    DO NOT: delete (not duplicate); no partial-fill (will deny script to complete), no refill request (F/U required). DISPENSE: 1 day early if closed on fill date. WARN: No CNS-depressants within 8 hrs of med.         Ms. Lynnleigh Soden has a current medication list which includes the following long-term medication(s): albuterol, bupropion, furosemide, gabapentin, montelukast, sertraline, mirtazapine, [START ON 12/01/2023] morphine , [START ON 12/31/2023] morphine , and [START ON 01/30/2024] morphine .  Pharmacotherapy (Medications Ordered): Meds ordered this encounter  Medications   morphine  (MSIR) 15 MG tablet    Sig: Take 1 tablet (15 mg total) by mouth every 8 (eight) hours. Must last 30 days.    Dispense:  90 tablet    Refill:  0    DO NOT: delete (not duplicate); no partial-fill (will deny script to complete), no refill request (F/U required). DISPENSE: 1 day early if closed on fill date. WARN: No CNS-depressants within 8 hrs of med.   morphine  (MSIR) 15 MG tablet    Sig: Take 1 tablet (15 mg total)  by mouth every 8 (eight) hours. Must last 30 days.    Dispense:  90 tablet    Refill:  0    DO NOT: delete (not duplicate); no partial-fill (will deny script to complete), no refill request (F/U required). DISPENSE: 1 day early if closed on fill date. WARN: No CNS-depressants within 8 hrs of med.   morphine  (MSIR) 15 MG tablet    Sig: Take 1 tablet (15 mg total) by mouth every 8 (eight) hours. Must last 30 days.    Dispense:  90 tablet    Refill:  0    DO NOT: delete (not duplicate); no partial-fill (will deny script to complete), no refill request (F/U required). DISPENSE: 1 day early if closed on fill date. WARN: No CNS-depressants within 8 hrs of med.   Orders:  No orders of the defined types were placed in this encounter.       Return in about 3 months (around 02/19/2024) for (F2F), (MM), Tiffany Blanch NP.    Recent Visits Date Type Provider Dept  08/27/23 Office Visit Shylo Zamor K, NP [facility]  Showing recent visits within past 90 days and meeting all other requirements Today's Visits Date Type Provider Dept  11/19/23 Office Visit Nefertari Rebman K, NP [facility]  Showing today's visits and meeting all other requirements Future Appointments Date Type Provider Dept  02/11/24 Appointment  Tereasa Yilmaz K, NP [facility]  Showing future appointments within next 90 days and meeting all other requirements  I discussed the assessment and treatment plan with the patient. The patient was provided an opportunity to ask questions and all were answered. The patient agreed with the plan and demonstrated an understanding of the instructions.  Patient advised to call back or seek an in-person evaluation if the symptoms or condition worsens.  I personally spent a total of 30 minutes in the care of the patient today including preparing to see the patient, getting/reviewing separately obtained history, performing a medically appropriate exam/evaluation, counseling  and educating, placing orders, referring and communicating with other health care professionals, documenting clinical information in the EHR, independently interpreting results, communicating results, and coordinating care.   Note by: Giavonni Fonder K Zayne Draheim, NP (TTS and AI technology used. I apologize for any typographical errors that were not detected and corrected.) Date: 11/19/2023; Time: 10:54 AM

## 2023-11-19 NOTE — Progress Notes (Signed)
 Nursing Pain Medication Assessment:  Safety precautions to be maintained throughout the outpatient stay will include: orient to surroundings, keep bed in low position, maintain call bell within reach at all times, provide assistance with transfer out of bed and ambulation.  Medication Inspection Compliance: Pill count conducted under aseptic conditions, in front of the patient. Neither the pills nor the bottle was removed from the patient's sight at any time. Once count was completed pills were immediately returned to the patient in their original bottle.  Medication: Morphine  IR Pill/Patch Count: 41 of 90 pills/patches remain Pill/Patch Appearance: Markings consistent with prescribed medication Bottle Appearance: Standard pharmacy container. Clearly labeled. Filled Date: 53 / 23 / 2025 Last Medication intake:  Today

## 2024-01-29 ENCOUNTER — Encounter: Admitting: Nurse Practitioner

## 2024-02-07 ENCOUNTER — Ambulatory Visit: Attending: Nurse Practitioner | Admitting: Nurse Practitioner

## 2024-02-07 ENCOUNTER — Encounter: Payer: Self-pay | Admitting: Nurse Practitioner

## 2024-02-07 DIAGNOSIS — M79604 Pain in right leg: Secondary | ICD-10-CM | POA: Diagnosis not present

## 2024-02-07 DIAGNOSIS — M25552 Pain in left hip: Secondary | ICD-10-CM

## 2024-02-07 DIAGNOSIS — M159 Polyosteoarthritis, unspecified: Secondary | ICD-10-CM

## 2024-02-07 DIAGNOSIS — M47816 Spondylosis without myelopathy or radiculopathy, lumbar region: Secondary | ICD-10-CM

## 2024-02-07 DIAGNOSIS — Z79891 Long term (current) use of opiate analgesic: Secondary | ICD-10-CM | POA: Diagnosis not present

## 2024-02-07 DIAGNOSIS — G35D Multiple sclerosis, unspecified: Secondary | ICD-10-CM

## 2024-02-07 DIAGNOSIS — Z79899 Other long term (current) drug therapy: Secondary | ICD-10-CM

## 2024-02-07 DIAGNOSIS — G894 Chronic pain syndrome: Secondary | ICD-10-CM | POA: Diagnosis not present

## 2024-02-07 DIAGNOSIS — M5441 Lumbago with sciatica, right side: Secondary | ICD-10-CM | POA: Diagnosis not present

## 2024-02-07 DIAGNOSIS — G8929 Other chronic pain: Secondary | ICD-10-CM

## 2024-02-07 DIAGNOSIS — M533 Sacrococcygeal disorders, not elsewhere classified: Secondary | ICD-10-CM | POA: Diagnosis not present

## 2024-02-07 DIAGNOSIS — M961 Postlaminectomy syndrome, not elsewhere classified: Secondary | ICD-10-CM | POA: Diagnosis not present

## 2024-02-07 MED ORDER — MORPHINE SULFATE 15 MG PO TABS: 15.0000 mg | ORAL_TABLET | Freq: Three times a day (TID) | ORAL | 0 refills | Status: AC

## 2024-02-07 NOTE — Progress Notes (Signed)
 PROVIDER NOTE: Interpretation of information contained herein should be left to medically-trained personnel. Specific patient instructions are provided elsewhere under Patient Instructions section of medical record. This document was created in part using AI and STT-dictation technology, any transcriptional errors that may result from this process are unintentional.  Patient: Tiffany Herring  Service: E/M   PCP: Rojelio Loader, MD  DOB: [birth year]  DOS: 02/07/2024  Provider: Emmy MARLA Blanch, NP  MRN: [id number]  Delivery: Virtual Visit  Specialty: Interventional Pain Management  Type: Established Patient  Setting: Ambulatory outpatient facility  Specialty designation: 09  Referring Prov.: Rojelio Loader, MD  Location: Remote location       Virtual Encounter - Pain Management PROVIDER NOTE: Information contained herein reflects review and annotations entered in association with encounter. Interpretation of such information and data should be left to medically-trained personnel. Information provided to patient can be located elsewhere in the medical record under Patient Instructions. Document created using STT-dictation technology, any transcriptional errors that may result from process are unintentional.    Contact & Pharmacy Preferred: [phone] Home: [phone] (home) Mobile: [phone] (mobile) E-mail: [email or web address]  [facility] - [place], [place] - [address] [place] [address] Phone: [phone] Fax: [phone]   Pre-screening  [name] offered in-person vs virtual encounter. She indicated preferring virtual for this encounter.   Reason COVID-19*  Social distancing based on CDC and AMA recommendations.   I contacted Tiffany Herring on 02/07/2024 via telephone.      I clearly identified myself as Tiffany MARLA Blanch, NP. I verified that I was speaking with the correct person using two identifiers (Name: Tiffany Herring, and date of birth:  06/09/1960).  Consent I sought verbal advanced consent from Tiffany Herring for virtual visit interactions. I informed [name] of possible security and privacy concerns, risks, and limitations associated with providing not-in-person medical evaluation and management services. I also informed [name] of the availability of in-person appointments. Finally, I informed her that there would be a charge for the virtual visit and that she could be  personally, fully or partially, financially responsible for it. [name] expressed understanding and agreed to proceed.   Historic Elements   [name] is a 64 y.o. year old, female patient evaluated today after our last contact on 11/19/2023. [name]  has a past medical history of Allergy, Arthritis, Asthma, Chronic kidney disease, Depression, Diabetes mellitus without complication (HCC), Hypertension, Multiple sclerosis, Neuromuscular disorder (HCC), and Sleep apnea. She also  has a past surgical history that includes Cesarean section (1991 and 1996). [name] has a current medication list which includes the following prescription(s): albuterol, aspirin, b-d ins syr ultrafine 1cc/30g, fifty50 glucose meter 2.0, bupropion, cholecalciferol, clotrimazole, freestyle libre 3 sensor, enalapril, furosemide, gabapentin, blood glucose test strips, glucose blood, accu-chek multiclix, latanoprost, montelukast, mupirocin ointment, oxybutynin, sertraline, timolol, tizanidine, cyanocobalamin, and [START ON 02/29/2024] morphine . She  reports that she has quit smoking. She has never used smokeless tobacco. She reports that she does not drink alcohol and does not use drugs. [name] is allergic to amoxicillin, amoxicillin-pot clavulanate, cephalexin, copaxone  [glatiramer acetate], glatiramer, pseudoephedrine, rofecoxib, sulfa antibiotics, and meloxicam.  BMI: Estimated body mass index is 41.4 kg/m as calculated from the following:   Height as  of 11/19/23: 5' (1.524 m).   Weight as of 11/19/23: 212 lb (96.2 kg). Last encounter: 11/19/2023. Last procedure: Visit date not found.  HPI  Today, she is  being contacted for medication management. The patient is doing well with current medication regimen. No side effects or adverse reaction reported to medication. She continues experiencing bilateral hip pain that has progressively worsened over the past six months. The pain has become so severe that she is unable to move her legs effectively, stating, 'I can't move my legs.  She is maintained on morphine  15 mg every 8 hours; however, the pain has worsened to the point that she is unable to move her legs, and the current medication regimen no longer provides adequate pain relief.   Pharmacotherapy Assessment  Analgesic: Morphine  (MS IR) 15 mg tablet every 8 hours as needed for pain. MME for Morphine = 45 Monitoring: [place] PMP: PDMP reviewed during this encounter.       Pharmacotherapy: No side-effects or adverse reactions reported. Compliance: No problems identified. Effectiveness: Clinically acceptable. Plan: Refer to POC.  UDS:  Summary  Date Value Ref Range Status  08/27/2023 FINAL  Final    Comment:    ==================================================================== [employer] Select 13 (MW) ==================================================================== Test                             Result       Flag       Units  Drug Present and Declared for Prescription Verification   Morphine                        6560         EXPECTED   ng/mg creat   Normorphine                    153          EXPECTED   ng/mg creat    Potential sources of large amounts of morphine  in the absence of    codeine include administration of morphine  or use of heroin.     Normorphine is an expected metabolite of morphine .  Drug Present not Declared for Prescription Verification   Hydrocodone                     543          UNEXPECTED ng/mg creat    Norhydrocodone                 866          UNEXPECTED ng/mg creat    Sources of hydrocodone  include scheduled prescription medications.    Norhydrocodone is an expected metabolite of hydrocodone .  ==================================================================== Test                      Result    Flag   Units      Ref Range   Creatinine              53               mg/dL      >=79 ==================================================================== Declared Medications:  The flagging and interpretation on this report are based on the  following declared medications.  Unexpected results may arise from  inaccuracies in the declared medications.   **Note: The testing scope of this panel includes these medications:   Morphine  (MSIR)   **Note: The testing scope of this panel does not include the  following reported medications:   Albuterol (Ventolin HFA)  Aspirin  Bupropion (Wellbutrin)  Enalapril (Vasotec)  Furosemide (Lasix)  Gabapentin (  Neurontin)  Insulin  Latanoprost (Xalatan)  Mirtazapine (Remeron)  Montelukast (Singulair)  Mupirocin (Bactroban)  Naloxone  (Narcan )  Oxybutynin (Ditropan)  Timolol (Timoptic)  Tizanidine (Zanaflex)  Vitamin B12 ==================================================================== For clinical consultation, please call [phone]. ====================================================================    No results found for: CBDTHCR, D8THCCBX, D9THCCBX  Laboratory Chemistry Profile   Renal Lab Results  Component Value Date   BUN 18 02/06/2018   CREATININE 1.22 (H) 02/06/2018   BCR 15 02/06/2018   GFRAA 56 (L) 02/06/2018   GFRNONAA 49 (L) 02/06/2018    Hepatic Lab Results  Component Value Date   AST 25 02/06/2018   ALT 19 02/15/2015   ALBUMIN 4.6 02/06/2018   ALKPHOS 97 02/06/2018    Electrolytes Lab Results  Component Value Date   NA 136 02/06/2018   K 4.9 02/06/2018   CL 95 (L) 02/06/2018   CALCIUM 9.8  02/06/2018   MG 1.8 02/06/2018    Bone Lab Results  Component Value Date   25OHVITD1 55 02/06/2018   25OHVITD2 2.9 02/06/2018   25OHVITD3 52 02/06/2018    Inflammation (CRP: Acute Phase) (ESR: Chronic Phase) Lab Results  Component Value Date   CRP 17 (H) 02/06/2018   ESRSEDRATE 28 02/06/2018         Note: Above Lab results reviewed.  Imaging  US  RENAL CLINICAL DATA:  Chronic renal disease  EXAM: RENAL / URINARY TRACT ULTRASOUND COMPLETE  COMPARISON:  None Available.  FINDINGS: Right Kidney:  Renal measurements: 10.3 x 4.9 x 5.4 cm = volume: 143 mL. Diffuse increased echogenicity.  Left Kidney:  Renal measurements: 9.9 x 4.6 x 5.2 cm = volume: 125 mL. Diffuse increased echogenicity.  Bladder:  Appears normal for degree of bladder distention.  Other:  None.  IMPRESSION: Diffuse increased echogenicity in both kidneys is consistent with medical renal disease. No other abnormalities.  Electronically Signed   By: Alm Pouch III M.D.   On: 11/25/2021 14:49  Narrative & Impression  PRIOR REPORT IMPORTED FROM AN EXTERNAL SYSTEM    CLINICAL DATA:  Left hip pain and limited range of motion    EXAM:  LEFT HIP - COMPLETE 2+ VIEW    COMPARISON:  None.    FINDINGS:  Severe left hip degenerative change noted. No fracture or  dislocation. No displaced pelvic fracture. Sacroiliac joints are  unremarkable.    IMPRESSION:  Negative.      Electronically Signed    By: Truman Seip M.D.    On: 03/24/2013 17:33     Assessment  The primary encounter diagnosis was Chronic hip pain (Left). 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  Plan of Care  Problem-specific:   Pharmacologic therapy: Patient's pain is controlled with morphine  15 mg tablet, will continue on current medication regimen. Prescribing drug monitoring (PDMP) reviewed, findings consistent with the use of prescribed medication and no evidence of narcotic misuse or abuse.  Urine drug screening (UDS) up to date and consistent with the use of prescribed medication.  No side effects or adverse reaction reported to medication.  Schedule follow-up in 30 days for medication management.    Ms. Sulema Braid has a current medication list which  includes the following long-term medication(s): albuterol, bupropion, furosemide, gabapentin, montelukast, sertraline, and [START ON 02/29/2024] morphine .  Pharmacotherapy (Medications Ordered): Meds ordered this encounter  Medications   morphine  (MSIR) 15 MG tablet    Sig: Take 1 tablet (15 mg total) by mouth every 8 (eight) hours. Must last 30 days.    Dispense:  90 tablet    Refill:  0    DO NOT: delete (not duplicate); no partial-fill (will deny script to complete), no refill request (F/U required). DISPENSE: 1 day early if closed on fill date. WARN: No CNS-depressants within 8 hrs of med.   Orders:  No orders of the defined types were placed in this encounter.  Follow-up plan:   Return in about 1 month (around 03/08/2024) for (F2F), (MM), Tiffany Blanch NP.         Recent Visits Date Type Provider Dept  11/19/23 Office Visit Armari Fussell K, NP [facility]  Showing recent visits within past 90 days and meeting all other requirements Today's Visits Date Type Provider Dept  02/07/24 Office Visit Heavyn Yearsley K, NP [facility]  Showing today's visits and meeting all other requirements Future Appointments No visits were found meeting these conditions. Showing future appointments  within next 90 days and meeting all other requirements  I discussed the assessment and treatment plan with the patient. The patient was provided an opportunity to ask questions and all were answered. The patient agreed with the plan and demonstrated an understanding of the instructions.  Patient advised to call back or seek an in-person evaluation if the symptoms or condition worsens.  Duration of encounter: 30 minutes.  Note by: Tiffany MARLA Blanch, NP Date: 02/07/2024; Time: 2:40 PM

## 2024-02-11 ENCOUNTER — Encounter: Admitting: Nurse Practitioner
